# Patient Record
Sex: Male | Born: 1945
Health system: Southern US, Community
[De-identification: ages and names within clinical notes are randomized; demographics above are authoritative.]

## PROBLEM LIST (undated history)

## (undated) DIAGNOSIS — A084 Viral intestinal infection, unspecified: Secondary | ICD-10-CM

## (undated) DIAGNOSIS — K219 Gastro-esophageal reflux disease without esophagitis: Secondary | ICD-10-CM

## (undated) DIAGNOSIS — I951 Orthostatic hypotension: Secondary | ICD-10-CM

## (undated) DIAGNOSIS — E109 Type 1 diabetes mellitus without complications: Secondary | ICD-10-CM

## (undated) DIAGNOSIS — I739 Peripheral vascular disease, unspecified: Secondary | ICD-10-CM

## (undated) DIAGNOSIS — F528 Other sexual dysfunction not due to a substance or known physiological condition: Secondary | ICD-10-CM

## (undated) DIAGNOSIS — N529 Male erectile dysfunction, unspecified: Secondary | ICD-10-CM

## (undated) DIAGNOSIS — E785 Hyperlipidemia, unspecified: Secondary | ICD-10-CM

## (undated) DIAGNOSIS — R531 Weakness: Secondary | ICD-10-CM

## (undated) DIAGNOSIS — I779 Disorder of arteries and arterioles, unspecified: Secondary | ICD-10-CM

## (undated) DIAGNOSIS — I219 Acute myocardial infarction, unspecified: Secondary | ICD-10-CM

## (undated) DIAGNOSIS — G459 Transient cerebral ischemic attack, unspecified: Secondary | ICD-10-CM

## (undated) DIAGNOSIS — I10 Essential (primary) hypertension: Secondary | ICD-10-CM

## (undated) DIAGNOSIS — I5189 Other ill-defined heart diseases: Secondary | ICD-10-CM

## (undated) DIAGNOSIS — R002 Palpitations: Secondary | ICD-10-CM

## (undated) DIAGNOSIS — C61 Malignant neoplasm of prostate: Secondary | ICD-10-CM

## (undated) DIAGNOSIS — I25810 Atherosclerosis of coronary artery bypass graft(s) without angina pectoris: Secondary | ICD-10-CM

## (undated) HISTORY — DX: Viral intestinal infection, unspecified: A08.4

## (undated) HISTORY — DX: Malignant neoplasm of prostate: C61

## (undated) HISTORY — DX: Other ill-defined heart diseases: I51.89

## (undated) HISTORY — DX: Palpitations: R00.2

## (undated) HISTORY — DX: Hyperlipidemia, unspecified: E78.5

## (undated) HISTORY — DX: Other sexual dysfunction not due to a substance or known physiological condition: F52.8

## (undated) HISTORY — PX: REFRACTIVE SURGERY: SHX103

## (undated) HISTORY — PX: MOHS SURGERY: SHX181

## (undated) HISTORY — DX: Male erectile dysfunction, unspecified: N52.9

## (undated) HISTORY — DX: Peripheral vascular disease, unspecified: I73.9

## (undated) HISTORY — PX: CAROTID ENDARTERECTOMY: SUR193

## (undated) HISTORY — DX: Essential (primary) hypertension: I10

## (undated) HISTORY — DX: Gastro-esophageal reflux disease without esophagitis: K21.9

## (undated) HISTORY — PX: CARDIAC CATHETERIZATION: SHX172

## (undated) HISTORY — DX: Acute myocardial infarction, unspecified: I21.9

## (undated) HISTORY — DX: Weakness: R53.1

## (undated) HISTORY — DX: Disorder of arteries and arterioles, unspecified: I77.9

## (undated) HISTORY — PX: CATARACT EXTRACTION, BILATERAL: SHX1313

## (undated) HISTORY — DX: Atherosclerosis of coronary artery bypass graft(s) without angina pectoris: I25.810

## (undated) HISTORY — PX: CORONARY ANGIOPLASTY: SHX604

---

## 1994-02-05 DIAGNOSIS — I219 Acute myocardial infarction, unspecified: Secondary | ICD-10-CM

## 1994-02-05 HISTORY — PX: CORONARY ARTERY BYPASS GRAFT: SHX141

## 1994-02-05 HISTORY — DX: Acute myocardial infarction, unspecified: I21.9

## 2002-03-03 ENCOUNTER — Encounter: Payer: Self-pay | Admitting: *Deleted

## 2002-03-05 ENCOUNTER — Inpatient Hospital Stay (HOSPITAL_COMMUNITY): Admission: RE | Admit: 2002-03-05 | Discharge: 2002-03-06 | Payer: Self-pay | Admitting: *Deleted

## 2002-03-05 ENCOUNTER — Encounter (INDEPENDENT_AMBULATORY_CARE_PROVIDER_SITE_OTHER): Payer: Self-pay | Admitting: *Deleted

## 2002-03-23 ENCOUNTER — Encounter: Payer: Self-pay | Admitting: *Deleted

## 2002-03-23 ENCOUNTER — Encounter: Admission: RE | Admit: 2002-03-23 | Discharge: 2002-03-23 | Payer: Self-pay | Admitting: *Deleted

## 2003-12-22 ENCOUNTER — Ambulatory Visit: Payer: Self-pay | Admitting: *Deleted

## 2004-03-31 ENCOUNTER — Ambulatory Visit: Payer: Self-pay | Admitting: *Deleted

## 2005-03-14 ENCOUNTER — Ambulatory Visit: Payer: Self-pay | Admitting: Cardiology

## 2005-03-28 ENCOUNTER — Ambulatory Visit: Payer: Self-pay | Admitting: Cardiology

## 2005-03-28 ENCOUNTER — Ambulatory Visit: Payer: Self-pay

## 2005-04-03 ENCOUNTER — Ambulatory Visit: Payer: Self-pay

## 2005-12-31 ENCOUNTER — Ambulatory Visit: Payer: Self-pay | Admitting: Cardiology

## 2006-01-01 ENCOUNTER — Ambulatory Visit: Payer: Self-pay | Admitting: Internal Medicine

## 2006-06-26 ENCOUNTER — Ambulatory Visit: Payer: Self-pay | Admitting: Internal Medicine

## 2006-07-05 ENCOUNTER — Ambulatory Visit: Payer: Self-pay

## 2006-07-24 ENCOUNTER — Ambulatory Visit: Payer: Self-pay | Admitting: Internal Medicine

## 2006-07-31 ENCOUNTER — Inpatient Hospital Stay (HOSPITAL_BASED_OUTPATIENT_CLINIC_OR_DEPARTMENT_OTHER): Admission: RE | Admit: 2006-07-31 | Discharge: 2006-07-31 | Payer: Self-pay | Admitting: Internal Medicine

## 2006-07-31 ENCOUNTER — Ambulatory Visit: Payer: Self-pay | Admitting: Internal Medicine

## 2006-09-09 ENCOUNTER — Ambulatory Visit: Payer: Self-pay | Admitting: Internal Medicine

## 2006-11-11 ENCOUNTER — Ambulatory Visit: Payer: Self-pay | Admitting: Internal Medicine

## 2007-03-11 ENCOUNTER — Ambulatory Visit: Payer: Self-pay | Admitting: Internal Medicine

## 2007-03-11 LAB — CONVERTED CEMR LAB
ALT: 26 units/L (ref 0–53)
AST: 36 units/L (ref 0–37)
Albumin: 4.2 g/dL (ref 3.5–5.2)
Alkaline Phosphatase: 62 units/L (ref 39–117)
BUN: 20 mg/dL (ref 6–23)
CO2: 23 meq/L (ref 19–32)
Calcium: 9.2 mg/dL (ref 8.4–10.5)
Chloride: 105 meq/L (ref 96–112)
Cholesterol: 110 mg/dL (ref 0–200)
Creatinine, Ser: 0.99 mg/dL (ref 0.40–1.50)
Glucose, Bld: 169 mg/dL — ABNORMAL HIGH (ref 70–99)
HDL: 41 mg/dL (ref >39)
Hgb A1c MFr Bld: 8.8 % — ABNORMAL HIGH (ref 4.6–6.1)
LDL Cholesterol: 57 mg/dL (ref 0–99)
PSA: 1.34 ng/mL (ref 0.10–4.00)
Potassium: 4.7 meq/L (ref 3.5–5.3)
Sodium: 141 meq/L (ref 135–145)
Total Bilirubin: 0.9 mg/dL (ref 0.3–1.2)
Total CHOL/HDL Ratio: 2.7
Total Protein: 6.6 g/dL (ref 6.0–8.3)
Triglycerides: 62 mg/dL (ref <150)
VLDL: 12 mg/dL (ref 0–40)

## 2007-03-14 ENCOUNTER — Ambulatory Visit: Payer: Self-pay | Admitting: Internal Medicine

## 2007-07-01 ENCOUNTER — Ambulatory Visit: Payer: Self-pay | Admitting: Unknown Physician Specialty

## 2007-07-14 ENCOUNTER — Ambulatory Visit: Payer: Self-pay | Admitting: Internal Medicine

## 2007-07-15 ENCOUNTER — Ambulatory Visit: Payer: Self-pay | Admitting: Internal Medicine

## 2007-07-15 LAB — CONVERTED CEMR LAB
ALT: 21 units/L (ref 0–53)
AST: 25 units/L (ref 0–37)
Albumin: 4.1 g/dL (ref 3.5–5.2)
Alkaline Phosphatase: 61 units/L (ref 39–117)
BUN: 24 mg/dL — ABNORMAL HIGH (ref 6–23)
CO2: 22 meq/L (ref 19–32)
Calcium: 9.2 mg/dL (ref 8.4–10.5)
Chloride: 106 meq/L (ref 96–112)
Cholesterol: 121 mg/dL (ref 0–200)
Creatinine, Ser: 1.13 mg/dL (ref 0.40–1.50)
Glucose, Bld: 150 mg/dL — ABNORMAL HIGH (ref 70–99)
HDL: 45 mg/dL (ref >39)
Hgb A1c MFr Bld: 8 % — ABNORMAL HIGH (ref 4.6–6.1)
LDL Cholesterol: 62 mg/dL (ref 0–99)
Potassium: 4.8 meq/L (ref 3.5–5.3)
Sodium: 141 meq/L (ref 135–145)
Total Bilirubin: 1.1 mg/dL (ref 0.3–1.2)
Total CHOL/HDL Ratio: 2.7
Total Protein: 6.8 g/dL (ref 6.0–8.3)
Triglycerides: 69 mg/dL (ref <150)
VLDL: 14 mg/dL (ref 0–40)

## 2007-10-16 ENCOUNTER — Ambulatory Visit: Payer: Self-pay | Admitting: *Deleted

## 2007-12-03 ENCOUNTER — Ambulatory Visit: Payer: Self-pay | Admitting: Internal Medicine

## 2007-12-03 LAB — CONVERTED CEMR LAB
ALT: 19 units/L (ref 0–53)
AST: 31 units/L (ref 0–37)
Albumin: 4 g/dL (ref 3.5–5.2)
Alkaline Phosphatase: 75 units/L (ref 39–117)
BUN: 20 mg/dL (ref 6–23)
CO2: 21 meq/L (ref 19–32)
Calcium: 8.6 mg/dL (ref 8.4–10.5)
Chloride: 107 meq/L (ref 96–112)
Cholesterol: 103 mg/dL (ref 0–200)
Creatinine, Ser: 0.91 mg/dL (ref 0.40–1.50)
Glucose, Bld: 92 mg/dL (ref 70–99)
HDL: 37 mg/dL — ABNORMAL LOW (ref >39)
Hgb A1c MFr Bld: 7 % — ABNORMAL HIGH (ref 4.6–6.1)
LDL Cholesterol: 56 mg/dL (ref 0–99)
Potassium: 4.1 meq/L (ref 3.5–5.3)
Sodium: 138 meq/L (ref 135–145)
Total Bilirubin: 0.8 mg/dL (ref 0.3–1.2)
Total CHOL/HDL Ratio: 2.8
Total Protein: 6.4 g/dL (ref 6.0–8.3)
Triglycerides: 51 mg/dL (ref <150)
VLDL: 10 mg/dL (ref 0–40)

## 2007-12-10 ENCOUNTER — Encounter: Payer: Self-pay | Admitting: Internal Medicine

## 2007-12-10 ENCOUNTER — Ambulatory Visit: Payer: Self-pay

## 2007-12-10 ENCOUNTER — Ambulatory Visit: Payer: Self-pay | Admitting: Internal Medicine

## 2008-02-19 ENCOUNTER — Ambulatory Visit: Payer: Self-pay | Admitting: Internal Medicine

## 2008-02-26 ENCOUNTER — Ambulatory Visit: Payer: Self-pay

## 2008-02-26 ENCOUNTER — Ambulatory Visit: Payer: Self-pay | Admitting: Internal Medicine

## 2008-02-26 LAB — CONVERTED CEMR LAB
ALT: 21 units/L (ref 0–53)
AST: 28 units/L (ref 0–37)
Albumin: 3.8 g/dL (ref 3.5–5.2)
Alkaline Phosphatase: 67 units/L (ref 39–117)
BUN: 19 mg/dL (ref 6–23)
Bilirubin, Direct: 0.2 mg/dL (ref 0.0–0.3)
CO2: 29 meq/L (ref 19–32)
Calcium: 9.2 mg/dL (ref 8.4–10.5)
Chloride: 105 meq/L (ref 96–112)
Cholesterol: 100 mg/dL (ref 0–200)
Creatinine, Ser: 1.1 mg/dL (ref 0.4–1.5)
GFR calc Af Amer: 87 mL/min
GFR calc non Af Amer: 72 mL/min
Glucose, Bld: 302 mg/dL — ABNORMAL HIGH (ref 70–99)
HDL: 38.6 mg/dL — ABNORMAL LOW (ref >39.0)
Hgb A1c MFr Bld: 7.9 % — ABNORMAL HIGH (ref 4.6–6.0)
LDL Cholesterol: 56 mg/dL (ref 0–99)
Potassium: 5.4 meq/L — ABNORMAL HIGH (ref 3.5–5.1)
Sodium: 139 meq/L (ref 135–145)
Total Bilirubin: 1.3 mg/dL — ABNORMAL HIGH (ref 0.3–1.2)
Total CHOL/HDL Ratio: 2.6
Total Protein: 6.6 g/dL (ref 6.0–8.3)
Triglycerides: 27 mg/dL (ref 0–149)
VLDL: 5 mg/dL (ref 0–40)

## 2008-03-16 ENCOUNTER — Ambulatory Visit: Payer: Self-pay | Admitting: Internal Medicine

## 2008-03-16 LAB — CONVERTED CEMR LAB
BUN: 17 mg/dL (ref 6–23)
CO2: 21 meq/L (ref 19–32)
Calcium: 8.7 mg/dL (ref 8.4–10.5)
Chloride: 103 meq/L (ref 96–112)
Creatinine, Ser: 0.98 mg/dL (ref 0.40–1.50)
Glucose, Bld: 312 mg/dL — ABNORMAL HIGH (ref 70–99)
Potassium: 4.7 meq/L (ref 3.5–5.3)
Sodium: 134 meq/L — ABNORMAL LOW (ref 135–145)

## 2008-06-08 ENCOUNTER — Encounter: Payer: Self-pay | Admitting: Internal Medicine

## 2008-06-08 ENCOUNTER — Ambulatory Visit: Payer: Self-pay | Admitting: Cardiology

## 2008-06-09 ENCOUNTER — Ambulatory Visit: Payer: Self-pay | Admitting: Internal Medicine

## 2008-06-09 ENCOUNTER — Encounter: Payer: Self-pay | Admitting: Internal Medicine

## 2008-06-09 DIAGNOSIS — I10 Essential (primary) hypertension: Secondary | ICD-10-CM | POA: Insufficient documentation

## 2008-06-09 DIAGNOSIS — I6529 Occlusion and stenosis of unspecified carotid artery: Secondary | ICD-10-CM | POA: Insufficient documentation

## 2008-06-09 DIAGNOSIS — F528 Other sexual dysfunction not due to a substance or known physiological condition: Secondary | ICD-10-CM | POA: Insufficient documentation

## 2008-06-09 DIAGNOSIS — I2510 Atherosclerotic heart disease of native coronary artery without angina pectoris: Secondary | ICD-10-CM | POA: Insufficient documentation

## 2008-06-09 DIAGNOSIS — E785 Hyperlipidemia, unspecified: Secondary | ICD-10-CM

## 2008-06-09 DIAGNOSIS — I25810 Atherosclerosis of coronary artery bypass graft(s) without angina pectoris: Secondary | ICD-10-CM

## 2008-06-09 DIAGNOSIS — I959 Hypotension, unspecified: Secondary | ICD-10-CM | POA: Insufficient documentation

## 2008-06-09 HISTORY — DX: Hyperlipidemia, unspecified: E78.5

## 2008-06-09 HISTORY — DX: Atherosclerosis of coronary artery bypass graft(s) without angina pectoris: I25.810

## 2008-06-09 HISTORY — DX: Other sexual dysfunction not due to a substance or known physiological condition: F52.8

## 2008-06-09 HISTORY — DX: Essential (primary) hypertension: I10

## 2008-07-28 ENCOUNTER — Encounter: Payer: Self-pay | Admitting: Cardiology

## 2008-07-28 ENCOUNTER — Ambulatory Visit: Payer: Self-pay | Admitting: Cardiology

## 2008-07-28 DIAGNOSIS — I951 Orthostatic hypotension: Secondary | ICD-10-CM | POA: Insufficient documentation

## 2008-09-21 ENCOUNTER — Telehealth: Payer: Self-pay | Admitting: Internal Medicine

## 2008-10-01 ENCOUNTER — Ambulatory Visit: Payer: Self-pay | Admitting: Internal Medicine

## 2008-10-01 DIAGNOSIS — E119 Type 2 diabetes mellitus without complications: Secondary | ICD-10-CM | POA: Insufficient documentation

## 2008-10-01 DIAGNOSIS — IMO0001 Reserved for inherently not codable concepts without codable children: Secondary | ICD-10-CM | POA: Insufficient documentation

## 2008-10-01 DIAGNOSIS — E118 Type 2 diabetes mellitus with unspecified complications: Secondary | ICD-10-CM | POA: Insufficient documentation

## 2008-10-01 DIAGNOSIS — Z794 Long term (current) use of insulin: Secondary | ICD-10-CM

## 2008-10-06 ENCOUNTER — Ambulatory Visit: Payer: Self-pay | Admitting: Internal Medicine

## 2008-10-06 DIAGNOSIS — I739 Peripheral vascular disease, unspecified: Secondary | ICD-10-CM

## 2008-10-06 HISTORY — DX: Peripheral vascular disease, unspecified: I73.9

## 2008-10-06 LAB — CONVERTED CEMR LAB
ALT: 22 units/L (ref 0–53)
AST: 29 units/L (ref 0–37)
Albumin: 4 g/dL (ref 3.5–5.2)
Alkaline Phosphatase: 69 units/L (ref 39–117)
BUN: 20 mg/dL (ref 6–23)
CO2: 22 meq/L (ref 19–32)
Calcium: 8.8 mg/dL (ref 8.4–10.5)
Chloride: 103 meq/L (ref 96–112)
Cholesterol: 116 mg/dL (ref 0–200)
Creatinine, Ser: 0.95 mg/dL (ref 0.40–1.50)
Glucose, Bld: 215 mg/dL — ABNORMAL HIGH (ref 70–99)
HDL: 48 mg/dL (ref >39)
Hgb A1c MFr Bld: 7.6 % — ABNORMAL HIGH (ref 4.6–6.1)
LDL Cholesterol: 58 mg/dL (ref 0–99)
Potassium: 4.8 meq/L (ref 3.5–5.3)
Sodium: 139 meq/L (ref 135–145)
Total Bilirubin: 0.7 mg/dL (ref 0.3–1.2)
Total CHOL/HDL Ratio: 2.4
Total Protein: 6.6 g/dL (ref 6.0–8.3)
Triglycerides: 48 mg/dL (ref <150)
VLDL: 10 mg/dL (ref 0–40)

## 2008-10-07 ENCOUNTER — Ambulatory Visit: Payer: Self-pay

## 2008-10-07 ENCOUNTER — Encounter: Payer: Self-pay | Admitting: Internal Medicine

## 2008-10-07 LAB — CONVERTED CEMR LAB
ALT: 20 units/L (ref 0–53)
AST: 26 units/L (ref 0–37)
Albumin: 4.1 g/dL (ref 3.5–5.2)
Alkaline Phosphatase: 64 units/L (ref 39–117)
BUN: 15 mg/dL (ref 6–23)
CO2: 23 meq/L (ref 19–32)
Calcium: 9.2 mg/dL (ref 8.4–10.5)
Chloride: 107 meq/L (ref 96–112)
Cholesterol: 111 mg/dL (ref 0–200)
Creatinine, Ser: 0.88 mg/dL (ref 0.40–1.50)
Glucose, Bld: 113 mg/dL — ABNORMAL HIGH (ref 70–99)
HDL: 46 mg/dL (ref >39)
Hgb A1c MFr Bld: 7.3 % — ABNORMAL HIGH (ref 4.6–6.1)
LDL Cholesterol: 57 mg/dL (ref 0–99)
Potassium: 4.6 meq/L (ref 3.5–5.3)
Sodium: 142 meq/L (ref 135–145)
Total Bilirubin: 0.9 mg/dL (ref 0.3–1.2)
Total CHOL/HDL Ratio: 2.4
Total Protein: 6.6 g/dL (ref 6.0–8.3)
Triglycerides: 40 mg/dL (ref <150)
VLDL: 8 mg/dL (ref 0–40)

## 2008-10-08 ENCOUNTER — Encounter: Payer: Self-pay | Admitting: Internal Medicine

## 2008-11-25 ENCOUNTER — Telehealth: Payer: Self-pay | Admitting: Internal Medicine

## 2008-11-25 DIAGNOSIS — R0602 Shortness of breath: Secondary | ICD-10-CM | POA: Insufficient documentation

## 2008-12-01 ENCOUNTER — Telehealth (INDEPENDENT_AMBULATORY_CARE_PROVIDER_SITE_OTHER): Payer: Self-pay | Admitting: *Deleted

## 2008-12-02 ENCOUNTER — Ambulatory Visit: Payer: Self-pay

## 2008-12-02 ENCOUNTER — Encounter (HOSPITAL_COMMUNITY): Admission: RE | Admit: 2008-12-02 | Discharge: 2009-02-01 | Payer: Self-pay | Admitting: Internal Medicine

## 2008-12-02 ENCOUNTER — Ambulatory Visit: Payer: Self-pay | Admitting: Cardiovascular Disease

## 2008-12-10 ENCOUNTER — Encounter: Payer: Self-pay | Admitting: Cardiovascular Disease

## 2008-12-20 ENCOUNTER — Encounter: Payer: Self-pay | Admitting: Internal Medicine

## 2008-12-20 ENCOUNTER — Ambulatory Visit: Payer: Self-pay | Admitting: Internal Medicine

## 2008-12-20 DIAGNOSIS — R9439 Abnormal result of other cardiovascular function study: Secondary | ICD-10-CM | POA: Insufficient documentation

## 2008-12-20 LAB — CONVERTED CEMR LAB
BUN: 16 mg/dL (ref 6–23)
CO2: 26 meq/L (ref 19–32)
Calcium: 9.3 mg/dL (ref 8.4–10.5)
Chloride: 103 meq/L (ref 96–112)
Creatinine, Ser: 1 mg/dL (ref 0.40–1.50)
Glucose, Bld: 196 mg/dL — ABNORMAL HIGH (ref 70–99)
HCT: 45.5 % (ref 39.0–52.0)
Hemoglobin: 14.8 g/dL (ref 13.0–17.0)
INR: 1.14 (ref <1.50)
MCHC: 32.5 g/dL (ref 30.0–36.0)
MCV: 96.6 fL (ref 78.0–100.0)
Platelets: 159 10*3/uL (ref 150–400)
Potassium: 5.2 meq/L (ref 3.5–5.3)
Prothrombin Time: 14.5 s (ref 11.6–15.2)
RBC: 4.71 M/uL (ref 4.22–5.81)
RDW: 13.1 % (ref 11.5–15.5)
Sodium: 137 meq/L (ref 135–145)
WBC: 8 10*3/uL (ref 4.0–10.5)

## 2008-12-21 ENCOUNTER — Ambulatory Visit: Payer: Self-pay | Admitting: Internal Medicine

## 2008-12-21 ENCOUNTER — Inpatient Hospital Stay (HOSPITAL_BASED_OUTPATIENT_CLINIC_OR_DEPARTMENT_OTHER): Admission: RE | Admit: 2008-12-21 | Discharge: 2008-12-21 | Payer: Self-pay | Admitting: Internal Medicine

## 2009-01-05 ENCOUNTER — Ambulatory Visit: Payer: Self-pay | Admitting: Internal Medicine

## 2009-05-11 ENCOUNTER — Ambulatory Visit: Payer: Self-pay | Admitting: Cardiovascular Disease

## 2009-05-12 LAB — CONVERTED CEMR LAB
ALT: 19 units/L (ref 0–53)
AST: 23 units/L (ref 0–37)
Albumin: 4.1 g/dL (ref 3.5–5.2)
Alkaline Phosphatase: 65 units/L (ref 39–117)
BUN: 15 mg/dL (ref 6–23)
CO2: 25 meq/L (ref 19–32)
Calcium: 8.9 mg/dL (ref 8.4–10.5)
Chloride: 103 meq/L (ref 96–112)
Cholesterol: 119 mg/dL (ref 0–200)
Creatinine, Ser: 0.96 mg/dL (ref 0.40–1.50)
Glucose, Bld: 223 mg/dL — ABNORMAL HIGH (ref 70–99)
HDL: 46 mg/dL (ref >39)
Hgb A1c MFr Bld: 8.2 % — ABNORMAL HIGH (ref 4.6–6.1)
LDL Cholesterol: 62 mg/dL (ref 0–99)
Potassium: 4.8 meq/L (ref 3.5–5.3)
Sodium: 139 meq/L (ref 135–145)
Total Bilirubin: 0.9 mg/dL (ref 0.3–1.2)
Total CHOL/HDL Ratio: 2.6
Total Protein: 6.3 g/dL (ref 6.0–8.3)
Triglycerides: 54 mg/dL (ref <150)
VLDL: 11 mg/dL (ref 0–40)

## 2009-05-18 ENCOUNTER — Ambulatory Visit: Payer: Self-pay | Admitting: Internal Medicine

## 2009-05-18 DIAGNOSIS — R42 Dizziness and giddiness: Secondary | ICD-10-CM | POA: Insufficient documentation

## 2009-08-11 ENCOUNTER — Ambulatory Visit: Payer: Medicare Other | Admitting: Family Medicine

## 2009-08-19 ENCOUNTER — Encounter: Payer: Self-pay | Admitting: Internal Medicine

## 2009-11-18 ENCOUNTER — Ambulatory Visit: Payer: Self-pay | Admitting: Internal Medicine

## 2009-11-22 ENCOUNTER — Encounter: Payer: Self-pay | Admitting: Internal Medicine

## 2009-11-22 LAB — CONVERTED CEMR LAB
AST: 24 units/L (ref 0–37)
Albumin: 4.2 g/dL (ref 3.5–5.2)
Alkaline Phosphatase: 61 units/L (ref 39–117)
Calcium: 9.5 mg/dL (ref 8.4–10.5)
Chloride: 104 meq/L (ref 96–112)
Cholesterol: 107 mg/dL (ref 0–200)
Glucose, Bld: 209 mg/dL — ABNORMAL HIGH (ref 70–99)
HDL: 47 mg/dL (ref >39)
LDL Cholesterol: 51 mg/dL (ref 0–99)
Potassium: 5.2 meq/L (ref 3.5–5.3)
Sodium: 140 meq/L (ref 135–145)
Total Protein: 6.8 g/dL (ref 6.0–8.3)
Triglycerides: 43 mg/dL (ref <150)

## 2009-11-25 ENCOUNTER — Ambulatory Visit: Payer: Self-pay | Admitting: Internal Medicine

## 2009-12-01 ENCOUNTER — Telehealth: Payer: Self-pay | Admitting: Internal Medicine

## 2009-12-13 ENCOUNTER — Encounter: Payer: Self-pay | Admitting: Internal Medicine

## 2009-12-13 ENCOUNTER — Ambulatory Visit: Payer: Self-pay

## 2010-02-28 ENCOUNTER — Encounter: Payer: Self-pay | Admitting: Internal Medicine

## 2010-02-28 ENCOUNTER — Ambulatory Visit
Admission: RE | Admit: 2010-02-28 | Discharge: 2010-02-28 | Payer: Self-pay | Source: Home / Self Care | Attending: Internal Medicine | Admitting: Internal Medicine

## 2010-03-05 LAB — CONVERTED CEMR LAB
BUN: 14 mg/dL (ref 6–23)
Creatinine, Ser: 0.85 mg/dL (ref 0.40–1.50)
Hgb A1c MFr Bld: 7.5 % — ABNORMAL HIGH (ref <5.7)
Potassium: 4.8 meq/L (ref 3.5–5.3)

## 2010-03-07 NOTE — Letter (Signed)
Summary: Custom - Lipid  Architectural technologist at [facility] [address]   [place], [place] [address]   Phone: [phone]  Fax: [phone]     November 22, 2009 MRN: [id number]   Derek Hogan [address] [place], [place]  [address]   Dear Mr. Marzan,  We have reviewed your cholesterol results.  They are as follows:     Total Cholesterol:    107 (Desirable: less than 150)       HDL  Cholesterol:     47  (Desirable: greater than 40 for men and 50 for women)       LDL Cholesterol:       51  (Desirable: less than 70 for moderate to high risk)       Triglycerides:       43  (Desirable: less than 150)  Our recommendations include: Looks good!   Call our office at the number listed above if you have any questions.  Lowering your LDL cholesterol is important, but it is only one of a large number of "risk factors" that may indicate that you are at risk for heart disease, stroke or other complications of hardening of the arteries.  Other risk factors include:   A.  Cigarette Smoking* B.  High Blood Pressure* C.  Obesity* D.   Low HDL Cholesterol (see yours above)* E.   Diabetes Mellitus (higher risk if your is uncontrolled) F.  Family history of premature heart disease G.  Previous history of stroke or cardiovascular disease    *These are risk factors YOU HAVE CONTROL OVER.  For more information, visit .  There is now evidence that lowering the TOTAL CHOLESTEROL AND LDL CHOLESTEROL can reduce the risk of heart disease.  The American Heart Association recommends the following guidelines for the treatment of elevated cholesterol:  1.  If there is now current heart disease and less than two risk factors, TOTAL CHOLESTEROL should be less than 200 and LDL CHOLESTEROL should be less than 100. 2.  If there is current heart disease or two or more risk factors, TOTAL CHOLESTEROL should be less than 200 and LDL CHOLESTEROL should be less than 70.  A diet low in cholesterol,  saturated fat, and calories is the cornerstone of treatment for elevated cholesterol.  Cessation of smoking and exercise are also important in the management of elevated cholesterol and preventing vascular disease.  Studies have shown that 30 to 60 minutes of physical activity most days can help lower blood pressure, lower cholesterol, and keep your weight at a healthy level.  Drug therapy is used when cholesterol levels do not respond to therapeutic lifestyle changes (smoking cessation, diet, and exercise) and remains unacceptably high.  If medication is started, it is important to have you levels checked periodically to evaluate the need for further treatment options.  Thank you,    [employer] Team

## 2010-03-07 NOTE — Letter (Signed)
Summary: [place] Results Engineer, agricultural at [facility] [address]   [place], [place] [address]   Phone: [phone]  Fax: [phone]      November 22, 2009 MRN: [id number]   Derek Hogan [address] [place], [place]  [address]   Dear Mr. [facility],  Your test ordered by Selena Batten has been reviewed by your physician (or physician assistant) and was found to be normal or stable. Your physician (or physician assistant) felt no changes were needed at this time.  ____ Echocardiogram  ____ Cardiac Stress Test  __x__ Lab Work  ____ Peripheral vascular study of arms, legs or neck  ____ CT scan or X-ray  ____ Lung or Breathing test  ____ Other:   Thank you.   Benedict Needy, RN    Arvilla Meres, MD, F.A.C.C

## 2010-03-07 NOTE — Assessment & Plan Note (Signed)
Summary: F4M/AMD   Visit Type:  Follow-up Primary Provider:  Einar Crow, MD   History of Present Illness: Derek Hogan is a delightful 65 year old male with a history of coronary artery disease status post previous inferior wall myocardial infarction followed by bypass surgery in 1996.  Last cardiac catheterization was in Nov 2010 for Myoview with mild inferior ischemia. This showed three-vessel coronary artery disease with patent grafts. There was significant lesion in distal RCA after the graft insertion which compromised flow to distal PL. There was also some mild flow to this region antegrade via native vessel.  I reviewed with Dr. Excell Seltzer and while ths area may be a source of mild ischemia we felt that trying to angioplasty the distal native RCA might compromise the flow in the SVG-PDA via competitive flow. Thus we elected to continue medical management.  He also has a history  of peripheral vascular disease status post right carotid endarterectomy by Dr. Liliane Bade, hypertension, hyperlipidemia, and diabetes which he is on an insulin pump.   He returns today for routine f/u.   He continues  to do very well. Exercising daily 30 min on eeliptical in am. Then treamill and weights in afternoon. No CP or SOB. No claudication. No edema. Gets dizzy occasionally when stands up. Ears ringing. LDL checked recently 62. HDL 46  Problems Prior to Update: 1)  Myocardial Perfusion Scan, With Stress Test, Abnormal  (ICD-794.39) 2)  Shortness of Breath  (ICD-786.05) 3)  Unspecified Peripheral Vascular Disease  (ICD-443.9) 4)  Dm  (ICD-250.00) 5)  Hypotension, Orthostatic  (ICD-458.0) 6)  Erectile Dysfunction  (ICD-302.72) 7)  Carotid Stenosis  (ICD-433.10) 8)  Hypotension, Unspecified  (ICD-458.9) 9)  Cad, Artery Bypass Graft  (ICD-414.04) 10)  Hyperlipidemia-mixed  (ICD-272.4) 11)  Hypertension, Unspecified  (ICD-401.9)  Medications Prior to Update: 1)  Viagra 100 Mg Tabs (Sildenafil Citrate) ....  Take 1 Tablet By Mouth As Directed 2)  Aspirin Ec 325 Mg Tbec (Aspirin) .... Take One Tablet By Mouth Daily 3)  Niacin Cr 750 Mg Cr-Tabs (Niacin) .... 2 By Mouth Once Daily 4)  Prilosec 20 Mg Cpdr (Omeprazole) .Marland Kitchen.. 1 Tab Once Daily 5)  Zetia 10 Mg Tabs (Ezetimibe) .... 1/2 Tab Once Daily 6)  Novolog 100 Unit/ml Soln (Insulin Aspart) .... Use As Directed 7)  Lisinopril 5 Mg Tabs (Lisinopril) .... 1/4 Tab Once Daily 8)  Lipitor 40 Mg Tabs (Atorvastatin Calcium) .Marland Kitchen.. 1 Tab Once Daily 9)  Atenolol 25 Mg Tabs (Atenolol) .... 1/2 Tab Once Daily 10)  Ambien 5 Mg Tabs (Zolpidem Tartrate) .... As Needed  Current Medications (verified): 1)  Viagra 100 Mg Tabs (Sildenafil Citrate) .... Take 1 Tablet By Mouth As Directed 2)  Aspirin Ec 325 Mg Tbec (Aspirin) .... Take One Tablet By Mouth Daily 3)  Niacin Cr 750 Mg Cr-Tabs (Niacin) .... 2 By Mouth Once Daily 4)  Prilosec 20 Mg Cpdr (Omeprazole) .Marland Kitchen.. 1 Tab Once Daily 5)  Zetia 10 Mg Tabs (Ezetimibe) .... 1/2 Tab Once Daily 6)  Novolog 100 Unit/ml Soln (Insulin Aspart) .... Use As Directed 7)  Lisinopril 5 Mg Tabs (Lisinopril) .... 1/4 Tab Once Daily 8)  Lipitor 40 Mg Tabs (Atorvastatin Calcium) .Marland Kitchen.. 1 Tab Once Daily 9)  Atenolol 25 Mg Tabs (Atenolol) .... 1/2 Tab Once Daily 10)  Ambien 5 Mg Tabs (Zolpidem Tartrate) .... As Needed  Allergies (verified): No Known Drug Allergies  Past History:  Past Medical History:  1. Coronary artery        --s/p  previous inferior wall MI followed by bypass surgery in 1996.         --last cardiac cath Nov 2010 2008, which showed three-vessel CAD with patent grafts, EF was 50%.              likely small area of ischemia in distal RCA and PL - medical management       --echo 11/09 EF 60-65%   2. PVD      -- s/p right carotid endarterectomy by Dr. Liliane Bade     -- carotid u/s 0-39% (9/10)      -- ABI L 0.95 R 1.0 (2/07)  3. Hypertension  4. Hyperlipidemia  5. Diabetes on an insulin pump.   6. Erectile  dysfunction  Review of Systems       As per HPI and past medical history; otherwise all systems negative.   Vital Signs:  Patient profile:   65 year old male Height:      72 inches Weight:      245 pounds BMI:     33.35 Pulse rate:   70 / minute Pulse rhythm:   regular BP sitting:   124 / 54  (left arm) Cuff size:   large  Vitals Entered By: Mercer Pod (May 18, 2009 3:49 PM)  Physical Exam  General:  General:  Gen: well appearing. no resp difficulty HEENT: normal Neck: supple. no JVD. R CEA scar. Carotids 2+ bilat; soft R bruit. No lymphadenopathy or thryomegaly appreciated. Cor: PMI nondisplaced. Regular rate & rhythm. No rubs, gallops, murmur. Lungs: clear Abdomen: soft, nontender, nondistended. No hepatosplenomegaly. No bruits or masses. Good bowel sounds. Extremities: no cyanosis, clubbing, rash, edema. goroin site ok with no hematoma or bruit Neuro: alert & orientedx3, cranial nerves grossly intact. moves all 4 extremities w/o difficulty. affect pleasant    Impression & Recommendations:  Problem # 1:  CAD, ARTERY BYPASS GRAFT (ICD-414.04) Stable. No evidence of ischemia. Continue current regimen.  Problem # 2:  HYPERLIPIDEMIA-MIXED (ICD-272.4) Lipids look great. LDL and HDL at goal. Continue current therapy.  Problem # 3:  CAROTID STENOSIS (ICD-433.10) Carotids look good. Repeat u/s in 2 years.  Problem # 4:  DIZZINESS (ICD-780.4) Suspect mild orthostasis. Suggested to keep fluid intake up while exercising.  Patient Instructions: 1)  Your physician recommends that you schedule a follow-up appointment in: 6 months 2)  Your physician recommends that you return for a FASTING lipid profile: lipids, cmet prior to 6 month appt

## 2010-03-07 NOTE — Assessment & Plan Note (Signed)
Summary: F6M/AMD   Visit Type:  Follow-up Primary Provider:  Einar Crow, MD  CC:  Denies chest pain or shortness of breath..  History of Present Illness: Derek Hogan is a delightful 65 year old male with a history of coronary artery disease status post previous inferior wall myocardial infarction followed by bypass surgery in 1996.  Last cardiac catheterization was in Nov 2010 for Myoview with mild inferior ischemia. This showed three-vessel coronary artery disease with patent grafts. There was significant lesion in distal RCA after the graft insertion which compromised flow to distal PL. There was also some mild flow to this region antegrade via native vessel.  I reviewed with Dr. Excell Seltzer and while ths area may be a source of mild ischemia we felt that trying to angioplasty the distal native RCA might compromise the flow in the SVG-PDA via competitive flow. Thus we elected to continue medical management.  He also has a history  of peripheral vascular disease status post right carotid endarterectomy by Dr. Liliane Bade, hypertension, hyperlipidemia, and diabetes which he is on an insulin pump.   Doing very well. Remain active with exercise 30-60 mins/day. Still dizzy at times but much better. Not orthostatic. No HF symptoms. No claudication.   Recent lipis looked great. Cr mildly elevated at 1.19 (suspect he is a little elevated)  Current Medications (verified): 1)  Viagra 100 Mg Tabs (Sildenafil Citrate) .... Take 1 Tablet By Mouth As Directed 2)  Aspirin Ec 325 Mg Tbec (Aspirin) .... Take One Tablet By Mouth Daily 3)  Niacin Cr 750 Mg Cr-Tabs (Niacin) .... 2 By Mouth Once Daily 4)  Prilosec 20 Mg Cpdr (Omeprazole) .Marland Kitchen.. 1 Tab Once Daily 5)  Zetia 10 Mg Tabs (Ezetimibe) .... 1/2 Tab Once Daily 6)  Novolog 100 Unit/ml Soln (Insulin Aspart) .... Use As Directed 7)  Lisinopril 5 Mg Tabs (Lisinopril) .... 1/4 Tab Once Daily 8)  Lipitor 40 Mg Tabs (Atorvastatin Calcium) .Marland Kitchen.. 1 Tab Once Daily 9)   Atenolol 25 Mg Tabs (Atenolol) .... 1/2 Tab Once Daily 10)  Ambien 5 Mg Tabs (Zolpidem Tartrate) .... As Needed  Allergies (verified): No Known Drug Allergies  Past History:  Past Medical History: Last updated: 05/18/2009  1. Coronary artery        --s/p previous inferior wall MI followed by bypass surgery in 1996.         --last cardiac cath Nov 2010 2008, which showed three-vessel CAD with patent grafts, EF was 50%.              likely small area of ischemia in distal RCA and PL - medical management       --echo 11/09 EF 60-65%   2. PVD      -- s/p right carotid endarterectomy by Dr. Liliane Bade     -- carotid u/s 0-39% (9/10)      -- ABI L 0.95 R 1.0 (2/07)  3. Hypertension  4. Hyperlipidemia  5. Diabetes on an insulin pump.   6. Erectile dysfunction  Past Surgical History: Last updated: 06/09/2008 CABG - 1996  Family History: Last updated: 06/09/2008 Family History of Coronary Artery Disease:  Family History of CVA or Stroke:   Social History: Last updated: 06/09/2008 Retired  Married  Tobacco Use - Former. -  1996 Alcohol Use - no  Risk Factors: Smoking Status: quit (06/09/2008)  Review of Systems       As per HPI and past medical history; otherwise all systems negative.   Vital Signs:  Patient  profile:   65 year old male Height:      72 inches Weight:      245 pounds BMI:     33.35 Pulse rate:   59 / minute BP sitting:   118 / 58  (left arm) Cuff size:   large  Vitals Entered By: Bishop Dublin, CMA (November 25, 2009 10:36 AM)  Physical Exam  General:  well appearing. no resp difficulty HEENT: normal Neck: supple. no JVD. R CEA scar. Carotids 2+ bilat; soft R bruit. No lymphadenopathy or thryomegaly appreciated. Cor: PMI nondisplaced. Regular rate & rhythm. No rubs, gallops, murmur. Lungs: clear Abdomen: soft, nontender, nondistended. No hepatosplenomegaly. No bruits or masses. Good bowel sounds. Extremities: no cyanosis, clubbing, rash, edema.  goroin site ok with no hematoma or bruit Neuro: alert & orientedx3, cranial nerves grossly intact. moves all 4 extremities w/o difficulty. affect pleasant    Impression & Recommendations:  Problem # 1:  CAD, ARTERY BYPASS GRAFT (ICD-414.04) Stable. No evidence of ischemia. Continue current regimen.  Problem # 2:  CAROTID STENOSIS (ICD-433.10) Minimal disease. Asymptomatic. Due for yearly u/s.  Problem # 3:  HYPERLIPIDEMIA-MIXED (ICD-272.4) Lipids look great. LDL and HDL at goal. Continue current therapy.  Problem # 4:  Mildly elevated creatinine Suspect pre-renal azotemia. Encouraged to drink a bit more fluids. Will recheck BMET today.   Other Orders: Carotid Duplex (Carotid Duplex)

## 2010-03-07 NOTE — Assessment & Plan Note (Signed)
Summary: Nurse visit- lightheadedness  Nurse Visit   Vital Signs:  Patient profile:   65 year old male Pulse (ortho):   68 / minute BP sitting:   110 / 52  (right arm) BP standing:   100 / 52  Serial Vital Signs/Assessments:  Time      Position  BP       Pulse  Resp  Temp     By 1:20pm    Lying RA  114/52   64                    Sherri Rad, RN, BSN 1:20pm    Sitting   110/52   68                    Sherri Rad, Charity fundraiser, BSN 1:20pm    Standing  100/52   68                    Sherri Rad, RN, BSN  Comments: 1:20pm 104/62 (2 min)- standing By: Sherri Rad, RN, BSN    Primary Provider:  Einar Crow, MD   History of Present Illness: The pt walked in to the office today stating he did not feel well when standing. He states this started about an hour prior to coming in. He states he does not feel dizzy, but he does feel lightheaded. His medications were verified. I inquired if the pt has been out in the heat. He states not today, but in the previous few days, he has exercised for about an hour and a half and sweated a lot. He states his blood sugars have been fine. He just ate prior to coming in the office. I explained to the pt he may be somewhat dehydrated. He was instructed to drink extra fluid today. He states he was planning to exercise this afternoon, but I advised that he rest instead. He will call us monday if he is not feeling better.    Current Medications (verified): 1)  Viagra 100 Mg Tabs (Sildenafil Citrate) .... Take 1 Tablet By Mouth As Directed 2)  Aspirin Ec 325 Mg Tbec (Aspirin) .... Take One Tablet By Mouth Daily 3)  Niacin Cr 750 Mg Cr-Tabs (Niacin) .... 2 By Mouth Once Daily 4)  Prilosec 20 Mg Cpdr (Omeprazole) .Marland Kitchen.. 1 Tab Once Daily 5)  Zetia 10 Mg Tabs (Ezetimibe) .... 1/2 Tab Once Daily 6)  Novolog 100 Unit/ml Soln (Insulin Aspart) .... Use As Directed 7)  Lisinopril 5 Mg Tabs (Lisinopril) .... 1/4 Tab Once Daily 8)  Lipitor 40 Mg Tabs  (Atorvastatin Calcium) .Marland Kitchen.. 1 Tab Once Daily 9)  Atenolol 25 Mg Tabs (Atenolol) .... 1/2 Tab Once Daily 10)  Ambien 5 Mg Tabs (Zolpidem Tartrate) .... As Needed  Allergies (verified): No Known Drug Allergies  Past History:  Past Medical History: Last updated: 05/18/2009  1. Coronary artery        --s/p previous inferior wall MI followed by bypass surgery in 1996.         --last cardiac cath Nov 2010 2008, which showed three-vessel CAD with patent grafts, EF was 50%.              likely small area of ischemia in distal RCA and PL - medical management       --echo 11/09 EF 60-65%   2. PVD      -- s/p right carotid endarterectomy by Dr. Liliane Bade     --  carotid u/s 0-39% (9/10)      -- ABI L 0.95 R 1.0 (2/07)  3. Hypertension  4. Hyperlipidemia  5. Diabetes on an insulin pump.   6. Erectile dysfunction  Appended Document: Nurse visit- lightheadedness agree with plan.

## 2010-03-07 NOTE — Miscellaneous (Signed)
Summary: Orders Update  Clinical Lists Changes  Orders: Added new Test order of T-Basic Metabolic Panel [id number]) - Signed Added new Test order of T-Hgb A1C ([id number]) - Signed

## 2010-03-07 NOTE — Progress Notes (Signed)
Summary: LABS  Phone Note Call from Patient Call back at Home Phone [phone]   Caller: SELF Call For: BENSIMHON Summary of Call: CALLING ABOUT LAB RESULTS Initial call taken by: Harlon Flor,  December 01, 2009 12:29 PM  Follow-up for Phone Call        made pt aware that Dr. Gala Romney had not signed off on labs yet will mail him a copy after labs are signed off on Follow-up by: Benedict Needy, RN,  December 01, 2009 2:30 PM

## 2010-03-09 NOTE — Assessment & Plan Note (Signed)
Summary: [facility]   Visit Type:  Follow-up Primary Provider:  Einar Crow, MD  CC:  "doing well" denies chest pain and SOB.  History of Present Illness: Derek Hogan is a delightful 65 year old male with a history of coronary artery disease status post previous inferior wall myocardial infarction followed by bypass surgery in 1996.  Last cardiac catheterization was in Nov 2010 for Myoview with mild inferior ischemia. This showed three-vessel coronary artery disease with patent grafts. There was significant lesion in distal RCA after the graft insertion which compromised flow to distal PL. There was also some mild flow to this region antegrade via native vessel.  I reviewed with Dr. Excell Seltzer and while ths area may be a source of mild ischemia we felt that trying to angioplasty the distal native RCA might compromise the flow in the SVG-PDA via competitive flow. Thus we elected to continue medical management.  He also has a history  of peripheral vascular disease status post right carotid endarterectomy, hypertension, hyperlipidemia, and diabetes which he is on an insulin pump.   Overall doing well. Continues to exercise every day without CP or undue SOB. However does note that when he walks the dog he gets SOB at times.  Has gained about 5 pounds. No orthopnea or PND. Remain active with exercise 30-60 mins/day. Still dizzy at times but much better. Not orthostatic. No HF symptoms. No claudication.   Recent carotid u/s 0-39% B  Current Medications (verified): 1)  Viagra 100 Mg Tabs (Sildenafil Citrate) .... Take 1 Tablet By Mouth As Directed 2)  Aspirin Ec 325 Mg Tbec (Aspirin) .... Take One Tablet By Mouth Daily 3)  Niacin Cr 750 Mg Cr-Tabs (Niacin) .... 2 By Mouth Once Daily 4)  Prilosec 20 Mg Cpdr (Omeprazole) .Marland Kitchen.. 1 Tab Once Daily 5)  Zetia 10 Mg Tabs (Ezetimibe) .... 1/2 Tab Once Daily 6)  Novolog 100 Unit/ml Soln (Insulin Aspart) .... Use As Directed 7)  Lisinopril 5 Mg Tabs  (Lisinopril) .... 1/4 Tab Once Daily 8)  Lipitor 40 Mg Tabs (Atorvastatin Calcium) .Marland Kitchen.. 1 Tab Once Daily 9)  Atenolol 25 Mg Tabs (Atenolol) .... 1/2 Tab Once Daily 10)  Ambien 5 Mg Tabs (Zolpidem Tartrate) .... As Needed  Allergies (verified): No Known Drug Allergies  Past History:  Past Medical History: Last updated: 05/18/2009  1. Coronary artery        --s/p previous inferior wall MI followed by bypass surgery in 1996.         --last cardiac cath Nov 2010 2008, which showed three-vessel CAD with patent grafts, EF was 50%.              likely small area of ischemia in distal RCA and PL - medical management       --echo 11/09 EF 60-65%   2. PVD      -- s/p right carotid endarterectomy by Dr. Liliane Bade     -- carotid u/s 0-39% (9/10)      -- ABI L 0.95 R 1.0 (2/07)  3. Hypertension  4. Hyperlipidemia  5. Diabetes on an insulin pump.   6. Erectile dysfunction  Past Surgical History: Last updated: 06/09/2008 CABG - 1996  Family History: Last updated: 06/09/2008 Family History of Coronary Artery Disease:  Family History of CVA or Stroke:   Social History: Last updated: 06/09/2008 Retired  Married  Tobacco Use - Former. -  1996 Alcohol Use - no  Risk Factors: Smoking Status: quit (06/09/2008)  Review of Systems  As per HPI and past medical history; otherwise all systems negative.   Vital Signs:  Patient profile:   65 year old male Height:      72 inches Weight:      249.25 pounds BMI:     33.93 Pulse rate:   64 / minute BP sitting:   102 / 68  (left arm) Cuff size:   large  Vitals Entered By: Lysbeth Galas CMA (February 28, 2010 3:44 PM)  Physical Exam  General:  well appearing. no resp difficulty HEENT: normal Neck: supple. no JVD. R CEA scar. Carotids 2+ bilat; soft R bruit. No lymphadenopathy or thryomegaly appreciated. Cor: PMI nondisplaced. Regular rate & rhythm. No rubs, gallops, murmur. Lungs: clear Abdomen: soft, nontender, nondistended. No  hepatosplenomegaly. No bruits or masses. Good bowel sounds. Extremities: no cyanosis, clubbing, rash, edema. goroin site ok with no hematoma or bruit Neuro: alert & orientedx3, cranial nerves grossly intact. moves all 4 extremities w/o difficulty. affect pleasant    Impression & Recommendations:  Problem # 1:  DYSPNEA Suspect this is likely due to weight gain and not angina or HF. have asked him to try and lose a few pounds and see if symptoms improve. we will see him back in 6 weks and reassess. knows to contact me if getting worse.   Problem # 2:  CAD, ARTERY BYPASS GRAFT (ICD-414.04) Stable. No evidence of ischemia. Continue current regimen.  Problem # 3:  CAROTID STENOSIS (ICD-433.10) U/s review with him. Looks good.   Other Orders: EKG w/ Interpretation (93000)  Patient Instructions: 1)  Your physician recommends that you schedule a follow-up appointment in: 6 weeks 2)  Your physician recommends that you continue on your current medications as directed. Please refer to the Current Medication list given to you today.

## 2010-03-30 ENCOUNTER — Ambulatory Visit: Payer: Medicare Other | Admitting: Internal Medicine

## 2010-04-11 ENCOUNTER — Ambulatory Visit: Payer: Self-pay | Admitting: Internal Medicine

## 2010-04-18 ENCOUNTER — Emergency Department (HOSPITAL_COMMUNITY): Payer: Medicare Other

## 2010-04-18 ENCOUNTER — Encounter (HOSPITAL_COMMUNITY): Payer: Self-pay | Admitting: Radiology

## 2010-04-18 ENCOUNTER — Inpatient Hospital Stay (HOSPITAL_COMMUNITY)
Admission: EM | Admit: 2010-04-18 | Discharge: 2010-04-19 | DRG: 316 | Disposition: A | Discharge status: Home / Self Care | Payer: Medicare Other | Attending: Internal Medicine | Admitting: Internal Medicine

## 2010-04-18 DIAGNOSIS — K219 Gastro-esophageal reflux disease without esophagitis: Secondary | ICD-10-CM | POA: Diagnosis present

## 2010-04-18 DIAGNOSIS — Z7982 Long term (current) use of aspirin: Secondary | ICD-10-CM

## 2010-04-18 DIAGNOSIS — I959 Hypotension, unspecified: Principal | ICD-10-CM | POA: Diagnosis present

## 2010-04-18 DIAGNOSIS — A088 Other specified intestinal infections: Secondary | ICD-10-CM | POA: Diagnosis present

## 2010-04-18 DIAGNOSIS — E860 Dehydration: Secondary | ICD-10-CM | POA: Diagnosis present

## 2010-04-18 DIAGNOSIS — I2510 Atherosclerotic heart disease of native coronary artery without angina pectoris: Secondary | ICD-10-CM | POA: Diagnosis present

## 2010-04-18 DIAGNOSIS — E785 Hyperlipidemia, unspecified: Secondary | ICD-10-CM | POA: Diagnosis present

## 2010-04-18 DIAGNOSIS — D72829 Elevated white blood cell count, unspecified: Secondary | ICD-10-CM | POA: Diagnosis present

## 2010-04-18 DIAGNOSIS — E119 Type 2 diabetes mellitus without complications: Secondary | ICD-10-CM | POA: Diagnosis present

## 2010-04-18 HISTORY — DX: Essential (primary) hypertension: I10

## 2010-04-18 LAB — COMPREHENSIVE METABOLIC PANEL
ALT: 23 U/L (ref 0–53)
Albumin: 3.4 g/dL — ABNORMAL LOW (ref 3.5–5.2)
Alkaline Phosphatase: 56 U/L (ref 39–117)
BUN: 20 mg/dL (ref 6–23)
Chloride: 102 mEq/L (ref 96–112)
Potassium: 4.9 mEq/L (ref 3.5–5.1)
Sodium: 135 mEq/L (ref 135–145)
Total Bilirubin: 1.4 mg/dL — ABNORMAL HIGH (ref 0.3–1.2)
Total Protein: 5.8 g/dL — ABNORMAL LOW (ref 6.0–8.3)

## 2010-04-18 LAB — CBC
HCT: 41.5 % (ref 39.0–52.0)
Hemoglobin: 14.5 g/dL (ref 13.0–17.0)
MCHC: 34.9 g/dL (ref 30.0–36.0)
RBC: 4.56 MIL/uL (ref 4.22–5.81)
WBC: 17.1 10*3/uL — ABNORMAL HIGH (ref 4.0–10.5)

## 2010-04-18 LAB — CK TOTAL AND CKMB (NOT AT ARMC): CK, MB: 2.3 ng/mL (ref 0.3–4.0)

## 2010-04-18 LAB — DIFFERENTIAL
Basophils Absolute: 0 10*3/uL (ref 0.0–0.1)
Lymphocytes Relative: 5 % — ABNORMAL LOW (ref 12–46)
Monocytes Absolute: 1.3 10*3/uL — ABNORMAL HIGH (ref 0.1–1.0)
Neutro Abs: 15 10*3/uL — ABNORMAL HIGH (ref 1.7–7.7)
Neutrophils Relative %: 87 % — ABNORMAL HIGH (ref 43–77)

## 2010-04-18 LAB — POCT CARDIAC MARKERS
CKMB, poc: <1 ng/mL — ABNORMAL LOW (ref 1.0–8.0)
Troponin i, poc: <0.05 ng/mL (ref 0.00–0.09)

## 2010-04-18 MED ORDER — IOHEXOL 300 MG/ML  SOLN
100.0000 mL | Freq: Once | INTRAMUSCULAR | Status: AC | PRN
  Administered 2010-04-18: 100 mL via INTRAVENOUS

## 2010-04-19 LAB — CLOSTRIDIUM DIFFICILE BY PCR: Toxigenic C. Difficile by PCR: NEGATIVE

## 2010-04-19 LAB — BASIC METABOLIC PANEL
CO2: 27 mEq/L (ref 19–32)
Calcium: 8.4 mg/dL (ref 8.4–10.5)
Creatinine, Ser: 1.09 mg/dL (ref 0.4–1.5)
GFR calc Af Amer: >60 mL/min (ref >60)
GFR calc non Af Amer: >60 mL/min (ref >60)
Sodium: 138 mEq/L (ref 135–145)

## 2010-04-19 LAB — HEMOGLOBIN A1C
Hgb A1c MFr Bld: 7.7 % — ABNORMAL HIGH (ref <5.7)
Mean Plasma Glucose: 174 mg/dL — ABNORMAL HIGH (ref <117)

## 2010-04-19 LAB — GLUCOSE, CAPILLARY
Glucose-Capillary: 171 mg/dL — ABNORMAL HIGH (ref 70–99)
Glucose-Capillary: 197 mg/dL — ABNORMAL HIGH (ref 70–99)
Glucose-Capillary: 322 mg/dL — ABNORMAL HIGH (ref 70–99)

## 2010-04-19 LAB — CARDIAC PANEL(CRET KIN+CKTOT+MB+TROPI)
CK, MB: 3.2 ng/mL (ref 0.3–4.0)
CK, MB: 3.5 ng/mL (ref 0.3–4.0)
Relative Index: 1.7 (ref 0.0–2.5)
Relative Index: 1.7 (ref 0.0–2.5)
Total CK: 189 U/L (ref 7–232)
Total CK: 204 U/L (ref 7–232)

## 2010-04-19 LAB — DIFFERENTIAL
Basophils Absolute: 0 10*3/uL (ref 0.0–0.1)
Basophils Relative: 0 % (ref 0–1)
Eosinophils Absolute: 0 10*3/uL (ref 0.0–0.7)
Eosinophils Relative: 0 % (ref 0–5)
Neutrophils Relative %: 78 % — ABNORMAL HIGH (ref 43–77)

## 2010-04-19 LAB — CORTISOL: Cortisol, Plasma: 7.5 ug/dL

## 2010-04-19 LAB — CBC
MCV: 93.6 fL (ref 78.0–100.0)
Platelets: 158 10*3/uL (ref 150–400)
RBC: 4.21 MIL/uL — ABNORMAL LOW (ref 4.22–5.81)
RDW: 12.8 % (ref 11.5–15.5)
WBC: 9.6 10*3/uL (ref 4.0–10.5)

## 2010-04-19 LAB — TSH: TSH: 0.46 u[IU]/mL (ref 0.350–4.500)

## 2010-04-21 NOTE — H&P (Signed)
NAME:  Derek Hogan, Derek Hogan NO.:  [id number]  MEDICAL RECORD NO.:  [id number]           PATIENT TYPE:  E  LOCATION:  [facility]                         FACILITY:  [facility]  PHYSICIAN:  Della Goo, M.D. DATE OF BIRTH:  1945/12/11  DATE OF ADMISSION:  04/18/2010 DATE OF DISCHARGE:                             HISTORY & PHYSICAL   PRIMARY CARE PHYSICIAN:  Unassigned, the patient receives his care at the [facility] in [place], [place].  CARDIOLOGIST:  [name], MD, [facility].  CHIEF COMPLAINT:  Weakness.  HISTORY OF PRESENT ILLNESS:  This is a 65 year old male who was brought to the emergency department by his family secondary to worsening weakness which began upon awakening.  The patient reports feeling "crummy."  He had symptoms of pain in his neck, back, arms, and legs. The patient states that he undergoes a workout four times a week where he does elliptical training and rides a bicycle and has done so for quite awhile.  He denies doing any new activities.  He denies having any fevers but does report having nausea and sweats.  He reports having three loose stools and denies seeing any blood in the stool.  He denies having any black stool.  He also denies having any vomiting.  The patient denies having any chest pain or shortness of breath, but he does have a cardiac history.  He suffered a myocardial infarction in 1996 and underwent a coronary artery bypass grafting, and he states that when he suffered the heart attack, he did not have chest pain at that time.  The patient contacted [facility] in the afternoon and was advised to come to the emergency department secondary to his symptoms.  In the emergency department, the patient was evaluated and on arrival, his blood pressure was found to be 74/48.  The patient had a heart rate of 98-101, and he was administered IV fluids for fluid resuscitation. His  blood pressure did respond to IV fluids and improved.  His laboratory studies in the emergency department revealed an elevated white count of 17.1.  The other remarkable findings were, point-of-care cardiac markers were negative, EKG revealed nonacute findings; however, there is no previous EKG to compare in the system at this time.  PAST MEDICAL HISTORY:  Significant for: 1. Coronary artery disease status post coronary artery bypass     grafting. 2. Insulin-dependent diabetes mellitus status post insulin pump. 3. Dyslipidemia. 4. Gastroesophageal reflux disease. 5. Status post cataract surgery. 6. Status post carotid endarterectomy of the right carotid.  MEDICATIONS:  At this time include: 1. Aspirin 325 mg one p.o. daily. 2. Atenolol 12.5 mg one p.o. q.a.m. 3. Atorvastatin 40 mg one p.o. nightly. 4. Zetia 10 mg one p.o. nightly. 5. Lisinopril 10 mg p.o. nightly. 6. Niacin 1500 mg one p.o. nightly. 7. Prevacid dose needs to be further verified p.o. daily.  ALLERGIES:  No known drug allergies.  SOCIAL HISTORY:  The patient is married with children.  He is a nonsmoker.  He has a remote history of smoking, he quit in 1996.  He has a remote history of alcoholism.  He quit on Halloween in 1982.  He denies any illicit drug usage.  FAMILY HISTORY:  Positive for coronary artery disease and diabetic disease in both parents.  REVIEW OF SYSTEMS:  Pertinents are mentioned above.  PHYSICAL EXAMINATION FINDINGS:  GENERAL:  This is a morbidly obese 28- year-old Caucasian male who is in no discomfort or acute distress currently. VITAL SIGNS:  Initially, temperature 99.3; blood pressure 74/48, now the blood pressure is 101/50; heart rate 98; respirations 14; O2 sats 99- 100%. HEENT:  Normocephalic, atraumatic.  Pupils equally round and reactive to light.  Extraocular movements are intact.  Funduscopic benign.  There is no scleral icterus.  Nares are patent bilaterally.  Oropharynx is  clear. NECK:  Supple.  Full range of motion.  No thyromegaly, adenopathy, or jugular venous distention. CARDIOVASCULAR:  Regular rate and rhythm.  No murmurs, gallops, or rubs appreciated. LUNGS:  Clear to auscultation bilaterally.  No rales, rhonchi, or wheezes. ABDOMEN:  Positive bowel sounds, soft, nontender, nondistended.  Obese. No hepatosplenomegaly. EXTREMITIES:  Without cyanosis, clubbing, or edema. NEUROLOGIC:  The patient is alert and oriented x3.  Cranial nerves are intact.  Motor and sensory function also intact.  Generalized weakness, however, this has improved after some rehydration.  The patient has no focal deficits on examination.  LABORATORY STUDIES:  White blood cell count 17.1, hemoglobin 14.5, hematocrit 41.5, MCV 91.0, platelets 193, neutrophils 87%, lymphocytes 5%.  Sodium 135, potassium 4.9, chloride 102, carbon dioxide 25, BUN 20, creatinine 1.27, and glucose 289, albumin 3.4, AST 23, ALT 23.  Point-of- care cardiac markers with a myoglobin of 70.6, CK-MB less than 1.0, troponin less than 0.05.  EKG reveals a normal sinus rhythm.  There is a right bundle-branch block which is seen; however, this is possibly a chronic finding since his coronary artery disease in 1996.  The patient also has T-wave inversion which is seen in leads V1 and V2.  ASSESSMENT:  This is a 65 year old male being admitted with: 1. Hypotension. 2. Leukocytosis. 3. Hyperglycemia. 4. Coronary artery disease. 5. Early gastroenteritis.  PLAN:  The patient will be admitted to telemetry area for monitoring. Cardiac enzymes will be performed.  The patient will continue on IV fluids for rehydration therapy, his blood pressures will be monitored. The patient's blood pressure medications will be held at this time. Antiemetics will be ordered.  The patient will be monitored for further signs and symptoms of nausea, vomiting, or diarrhea.  He will be placed on an isolation.  His insulin pump  will be placed on hold for now and sliding scale insulin coverage will be ordered.  The patient's regular medications will be further verified and reconciled.  The patient is a full code and further workup will ensue pending results, the patient's clinical course.     Della Goo, M.D.     HJ/MEDQ  D:  04/18/2010  T:  04/18/2010  Job:  [id number]  Electronically Signed by Della Goo M.D. on 04/21/2010 09:57:35 AM

## 2010-04-28 NOTE — Discharge Summary (Signed)
NAME:  Derek Hogan, Derek Hogan NO.:  [id number]  MEDICAL RECORD NO.:  [id number]           PATIENT TYPE:  I  LOCATION:  4733                         FACILITY:  [facility]  PHYSICIAN:  Ramiro Harvest, MD    DATE OF BIRTH:  02-19-1945  DATE OF ADMISSION:  04/18/2010 DATE OF DISCHARGE:  04/19/2010                        DISCHARGE SUMMARY - REFERRING   PRIMARY CARE PHYSICIAN:  Dr. Tamera Punt of the [place] in [place], [place].  CARDIOLOGY:  [name], MD of [facility].  DISCHARGE DIAGNOSES: 1. Hypotension secondary to volume depletion/dehydration, resolved. 2. Reactive leukocytosis, resolved. 3. Viral gastroenteritis, improved. 4. Type 2 diabetes, hemoglobin A1c 7.7. 5. Gastroesophageal reflux disease. 6. Coronary artery disease, status post CABG. 7. Insulin dependent diabetes mellitus, on insulin pump. 8. Dyslipidemia. 9. Status post cataract surgery. 10.Status post carotid endarterectomy of the right carotid.  DISCHARGE MEDICATIONS: 1. Ambien 10 mg p.o. at bedtime. 2. Aspirin 325 mg p.o. at bedtime. 3. Atenolol 25 mg half a tablet p.o. q.a.m. to resume in 1-2 days. 4. Atorvastatin 40 mg p.o. at bedtime. 5. Zetia 10 mg p.o. at bedtime. 6. Insulin pump. 7. Lisinopril 10 mg p.o. at bedtime to resume in 1-2 days. 8. Niacin 750 mg 2 tablets p.o. at bedtime. 9. Omeprazole 20 mg p.o. q.a.m.  DISPOSITION AND FOLLOWUP:  The patient will be discharged home.  The patient is to follow up with his PCP as scheduled this week and follow- up.  The patient's blood pressure will need to be reassessed.  The patient will need a repeat BMET as well as CBC to follow up on electrolytes and renal function.  CONSULTATIONS DONE:  None.  PROCEDURES PERFORMED:  A CT angiogram of the chest was done on April 18, 2010 that showed no evidence for centrally obstructing/major pulmonary embolus.  No aortic dissection or aneurysm, COPD with previous CABG and cardiomegaly, hiatal  hernia and hepatomegaly.  BRIEF ADMISSION HISTORY AND PHYSICAL:  Mr. Derek Hogan is a 65 year old gentleman who was brought to the ED by his family secondary to worsening weakness, which began up on awakening.  The patient reported feeling clammy.  He had symptoms of pain, his neck, back, arms and legs. The patient stated that he undergoes a workout four times weekly.  He does elliptical training and rides a bicycle and has done so far for quite a while.  The patient denied doing any new activities.  Denies having any fevers, but does report having nausea and sweats.  Reports having 3 loose stools.  Denies seeing any blood in stool.  Denies any black stools.  Denied having any vomitus.  The patient denied having any chest pain or shortness of breath, but does have a cardiac history.  The patient suffered an MI in 1996, underwent a CABG and states that when he suffered his heart attack, he did not have a chest pain at that time. The patient contacted [facility] in the afternoon and was advised to present to the ED secondary to his symptoms.  In the ED, the patient was evaluated.  On arrival, systolic blood pressure was found to be 74/48.  The patient  had a heart rate of 98-101.  He was administered IV fluids for fluid resuscitation.  Blood pressure did respond to IV fluids and improved.  His labs in the ED revealed an elevated white count of 17.1.  Other remarkable findings were point-of-care cardiac markers were negative.  EKG revealed no acute findings.  However, there was no previous EKG to compare.  For the rest of admission history and physical, please see H and P dictated per Dr. Lovell Sheehan of job number [phone] [id number].  HOSPITAL COURSE: 1. Hypotension.  The patient was admitted with hypotension.  It was     felt likely secondary to volume depletion from GI losses as well as     dehydration secondary to recent intense workup.  Due to the     patient's cardiac history, he was  placed on a tele floor, placed on     IV fluids, placed on supportive care and antiemetics.  Cardiac     enzymes were cycled, which were negative x3.  Random cortisol     levels which was obtained was within normal limits at 7.5.  TSH     which was obtained was within normal limits at 0.460.  The patient     was maintained on IV fluids, remained asymptomatic.  The patient     improved clinically.  His weakness improved.  His systolic blood     pressure improved.  The patient's IV fluids were discontinued     around 1300 hours on the day of discharge and his blood pressure     was monitored for the rest of the afternoon.  The patient's blood     pressure systolic ranged in the 130s.  The patient was in stable     condition.  The patient was tolerating p.o.  Did not have any     further loose stools.  The patient improved clinically and the     patient wanted to be discharged home.  The patient is currently in     stable condition.  He will be discharged home in stable and     improved condition to follow up with his PCP as an outpatient.  The     patient's blood pressure medications were held during the     hospitalization and the patient has been advised to resume his     blood pressure medications in the next 1-2 days and to follow up     with his PCP as an outpatient.  His reactive leukocytosis on     admission, the patient was noted to have a leukocytosis.  CT of the     chest, which was done was negative for PE.  The patient's     leukocytosis improved and had normalized by day of discharge that     were down to 9.6. 2. Viral gastroenteritis.  On admission, the patient had some     complaints of nausea and loose stools.  Stool studies were obtained     and C diff, PCR which was obtained was negative.  The patient did     not have any further loose stools during the hospitalization.  He     was placed on antiemetic therapy as well as supportive care and the     patient remained in  stable condition, will be discharged in stable     and improved condition.  The rest of the patient's chronic medical     issues remained stable throughout the hospitalization and the  patient will be discharged in stable and improved condition.  VITAL SIGNS:  On day of discharge vital signs temperature 97.7, pulse of 72, respirations 20, blood pressure of 130/70, satting 97% on room air.  DISCHARGE LABS:  Hemoglobin A1c 7.7.  C diff PCR was negative.  BMET sodium 138, potassium 4.5, chloride 106, bicarb 27, glucose 169, BUN 19, creatinine 1.09 and a calcium of 8.4.  CBC with a white count of 9.6, hemoglobin 13.5, hematocrit 39.4 with a platelet count of 158 with an ANC of 7.5.  TSH was within normal limits at 0.460.  It has been a pleasure taking care of Mr. [employer].     Ramiro Harvest, MD     DT/MEDQ  D:  04/19/2010  T:  04/19/2010  Job:  [id number]  cc:   [name], MD Dr. Tamera Punt  Electronically Signed by Ramiro Harvest MD on 04/28/2010 12:11:30 PM

## 2010-05-01 ENCOUNTER — Ambulatory Visit: Payer: Medicare Other | Admitting: Internal Medicine

## 2010-05-04 ENCOUNTER — Telehealth: Payer: Self-pay

## 2010-05-04 NOTE — Telephone Encounter (Signed)
Given DM2; I would prefer he stays on lisinopril but was only on 5 mg daily and BP didn't tolerate so ok to stop.

## 2010-05-04 NOTE — Telephone Encounter (Signed)
BP readings: 3/17 9:15am 110/50, 3/18 8am 126/60 HR 80, 11pm 139/59, 3/19 7am 108/50 HR 80, 4p 112/52 HR 70, 6pm 118/56 HR 70, 10p 119/54 HR 62, 3/20 8am 121/60 HR 78, 10pm 125/59 HR 77, 3/21 BP 110/63 HR 73.  Pt states when he was discharged from hospital off Lisinopril, and pt states BP and HR have been stable off Lisinopril.  Is it OK to be off Lisinopril? Or should he resume?  Please call pt and advise.  Thanks [employer]

## 2010-05-05 NOTE — Telephone Encounter (Signed)
Attempted to contact pt, LMOM TCB.  

## 2010-05-08 ENCOUNTER — Telehealth: Payer: Self-pay | Admitting: *Deleted

## 2010-05-08 NOTE — Telephone Encounter (Signed)
Pt notified, he has f/u with Dr. Gala Romney next week.

## 2010-05-08 NOTE — Telephone Encounter (Signed)
Attempted to contact pt, LMOM TCB.  

## 2010-05-10 LAB — POCT I-STAT GLUCOSE: Glucose, Bld: 215 mg/dL — ABNORMAL HIGH (ref 70–99)

## 2010-05-12 ENCOUNTER — Encounter: Payer: Self-pay | Admitting: Internal Medicine

## 2010-05-15 ENCOUNTER — Encounter: Payer: Self-pay | Admitting: Internal Medicine

## 2010-05-15 ENCOUNTER — Encounter: Payer: Self-pay | Admitting: *Deleted

## 2010-05-16 ENCOUNTER — Ambulatory Visit (INDEPENDENT_AMBULATORY_CARE_PROVIDER_SITE_OTHER): Payer: Medicare Other | Admitting: Internal Medicine

## 2010-05-16 ENCOUNTER — Encounter: Payer: Self-pay | Admitting: Internal Medicine

## 2010-05-16 VITALS — BP 112/56 | HR 60 | Resp 18 | Ht 72.0 in | Wt 243.0 lb

## 2010-05-16 DIAGNOSIS — I6529 Occlusion and stenosis of unspecified carotid artery: Secondary | ICD-10-CM

## 2010-05-16 DIAGNOSIS — I2510 Atherosclerotic heart disease of native coronary artery without angina pectoris: Secondary | ICD-10-CM

## 2010-05-16 DIAGNOSIS — I959 Hypotension, unspecified: Secondary | ICD-10-CM

## 2010-05-16 DIAGNOSIS — E785 Hyperlipidemia, unspecified: Secondary | ICD-10-CM

## 2010-05-16 NOTE — Progress Notes (Signed)
HPI:  Derek Hogan is a delightful 65 year old male with a history of coronary artery disease status post previous inferior wall myocardial infarction followed by bypass surgery in 1996.  Last cardiac catheterization was in Nov 2010 for Myoview with mild inferior ischemia. This showed three-vessel coronary artery disease with patent grafts. There was significant lesion in distal RCA after the graft insertion which compromised flow to distal PL. There was also some mild flow to this region antegrade via native vessel.  I reviewed with Dr. Excell Seltzer and while ths area may be a source of mild ischemia we felt that trying to angioplasty the distal native RCA might compromise the flow in the SVG-PDA via competitive flow. Thus we elected to continue medical management.  Recently admitted to [facility] for hypotension (74/38) due to volume depletion. Got several liters of fluid and felt better. Now back to baseline. Exercising on a daily basis. No CP or undue dyspnea. No claudication. Trying to hydrate prior to exercise better. No neuro sx.  ROS: All systems negative except as listed in HPI, PMH and Problem List.  Past Medical History  Diagnosis Date  . Diabetes mellitus     type 2  . Hypertension   . CAD, ARTERY BYPASS GRAFT 06/09/2008  . UNSPECIFIED PERIPHERAL VASCULAR DISEASE 10/06/2008  . HYPERTENSION, UNSPECIFIED 06/09/2008  . HYPERLIPIDEMIA-MIXED 06/09/2008  . DM 10/01/2008  . ERECTILE DYSFUNCTION 06/09/2008  . Hypotension   . Viral gastroenteritis     04/19/2010 improved  . Dyslipidemia   . GERD (gastroesophageal reflux disease)   . Weakness   . Heart attack   . PVD (peripheral vascular disease)     -- s/p right carotid endarterectomy by Dr. Liliane Bade -- carotid u/s 0-39% (9/10) -- ABI L 0.95 R 1.0 (2/07)  . Erectile dysfunction     Current Outpatient Prescriptions  Medication Sig Dispense Refill  . aspirin 325 MG tablet Take 325 mg by mouth daily.        Marland Kitchen atenolol (TENORMIN) 25 MG tablet Take 12.5 mg  by mouth daily.        Marland Kitchen atorvastatin (LIPITOR) 40 MG tablet Take 40 mg by mouth daily.        Marland Kitchen ezetimibe (ZETIA) 10 MG tablet Take 10 mg by mouth daily.        . insulin aspart (NOVOLOG) 100 UNIT/ML injection as directed.        Marland Kitchen lisinopril (PRINIVIL,ZESTRIL) 20 MG tablet Take 20 mg by mouth. Take 1/4 of the tablet once a day       . niacin (NIASPAN) 750 MG CR tablet Take 750 mg by mouth 2 (two) times daily.       . NON FORMULARY as directed. Insulin pump       . omeprazole (PRILOSEC) 20 MG capsule Take 20 mg by mouth daily.        . sildenafil (VIAGRA) 100 MG tablet Take 100 mg by mouth daily as needed.        . zolpidem (AMBIEN) 5 MG tablet Take 5 mg by mouth at bedtime as needed.        Marland Kitchen DISCONTD: lisinopril (PRINIVIL,ZESTRIL) 5 MG tablet Take 10 mg by mouth daily.          PHYSICAL EXAM: Filed Vitals:   05/16/10 1135  BP: 112/56  Pulse: 60  Resp: 18   General:  Well appearing. No resp difficulty HEENT: normal Neck: supple. JVP flat. Carotids 2+ bilaterally; no bruits. No lymphadenopathy or thryomegaly appreciated. Cor:  PMI normal. Regular rate & rhythm. No rubs, gallops or murmurs. Lungs: clear Abdomen: soft, nontender, nondistended. No hepatosplenomegaly. No bruits or masses. Good bowel sounds. Extremities: no cyanosis, clubbing, rash, edema Neuro: alert & orientedx3, cranial nerves grossly intact. Moves all 4 extremities w/o difficulty. Affect pleasant.    ASSESSMENT & PLAN:

## 2010-05-16 NOTE — Patient Instructions (Signed)
Your physician wants you to follow-up in: 4 months. You will receive a reminder letter in the mail two months in advance. If you don't receive a letter, please call our office to schedule the follow-up appointment.  

## 2010-05-21 NOTE — Assessment & Plan Note (Signed)
No evidence of ischemia. Continue current regimen.   

## 2010-05-21 NOTE — Assessment & Plan Note (Signed)
Asymptomatic. Due for f/u u/s at next visit.

## 2010-05-21 NOTE — Assessment & Plan Note (Signed)
Lipids look good. LDL at goal. (< 70). Continue statin.

## 2010-05-21 NOTE — Assessment & Plan Note (Signed)
Improved. Discussed need to remain hydrated. Also suggested salt liberalization.

## 2010-06-01 NOTE — Telephone Encounter (Signed)
Opened in error

## 2010-06-06 ENCOUNTER — Encounter: Payer: Self-pay | Admitting: Internal Medicine

## 2010-06-20 NOTE — Procedures (Signed)
CAROTID DUPLEX EXAM   INDICATION:  Follow up right carotid endarterectomy.   HISTORY:  Diabetes:  Yes.  Cardiac:  CABG.  Hypertension:  Yes.  Smoking:  Previous.  Previous Surgery:  Right carotid endarterectomy on 03/05/02.  CV History:  No.  Amaurosis Fugax No, Paresthesias No, Hemiparesis No.                                       RIGHT             LEFT  Brachial systolic pressure:         128               130  Brachial Doppler waveforms:         Normal            Normal  Vertebral direction of flow:        Antegrade         Antegrade  DUPLEX VELOCITIES (cm/sec)  CCA peak systolic                   87                99  ECA peak systolic                   93                104  ICA peak systolic                   99                80  ICA end diastolic                   30                21  PLAQUE MORPHOLOGY:                  Heterogenous      Heterogenous  PLAQUE AMOUNT:                      Mild              Mild  PLAQUE LOCATION:                    CCA               ICA/ECA   IMPRESSION:  1. Patent right carotid endarterectomy site with no evidence of      stenosis.  2. 1-39% stenosis of the left internal carotid artery.  3. No significant change noted when compared to the previous      examination on 12/13/05.   ___________________________________________  P. Liliane Bade, M.D.   CH/MEDQ  D:  10/16/2007  T:  10/16/2007  Job:  [id number]

## 2010-06-20 NOTE — Cardiovascular Report (Signed)
NAME:  [name], [name] NO.:  [id number]   MEDICAL RECORD NO.:  [id number]          PATIENT TYPE:  OIB   LOCATION:  1962                         FACILITY:  [facility]   PHYSICIAN:  [name], MDDATE OF BIRTH:  21-Sep-1945   DATE OF PROCEDURE:  07/31/2006  DATE OF DISCHARGE:                            CARDIAC CATHETERIZATION   PRIMARY CARE:  Dr. Nada Boozer at the [facility].   PATIENT IDENTIFICATION:  Derek Hogan is a delightful 65 year old male  with a history of coronary artery disease status post bypass surgery in  1996.  He has recently been having some mild atypical chest pain, did a  Myoview showed possibility of minimal ischemia in the distal anterior  wall and apex.  He has also been having some claudication, so he was  brought to the catheterization laboratory for diagnostic angiography.   PROCEDURES PERFORMED:  1. Selective coronary angiography.  2. LIMA angiography.  3. Saphenous vein graft angiography x2.  4. Left heart catheterization.  5. Ventriculogram.  6. Abdominal aortogram.   DESCRIPTION OF PROCEDURE:  The risks and benefits of the catheterization  were explained.  Consent was signed and placed on the chart.  A 4-French  arterial sheath was placed in the right femoral artery using a modified  Seldinger technique.  A JL-4 catheter, 3-D RCA left coronary bypass and  angled pigtail catheter were used for procedure.  There were no apparent  complications.   Central aortic pressure 123/56 with a mean of 84.  LV pressure is 124/5  with an EDP of 12.  There is no aortic stenosis.   Left main was normal.   LAD was totally occluded in the mid section after the takeoff of a large  diagonal and a septal perforator.  There is an 80% proximal lesion in  the LAD.   Left circumflex gave off a small ramus, a small OM-1, a large OM-2, and  a moderate-sized OM-3.  There was a 50% lesion in the proximal left  circumflex.  The OM-2 was totally  occluded.  There was mild diffuse  disease in the OM-3.   The right coronary artery was a dominant vessel, had diffuse 60-70%  lesion throughout the mid section, then was totally occluded distally  before the takeoff of the PDA.   The LIMA to the LAD was a small vessel but patent without any  significant stenosis.  It provided good flow to the LAD.  The LAD itself  was moderate-size vessel.   Saphenous vein graft to the diagonal was widely patent.   Saphenous vein graft to the OM-2 was widely patent and it back-filled  the distal circumflex.   Saphenous vein graft to the PDA had minor luminal irregularities  throughout the body of the graft but provided good flow to the PDA.   Left ventriculogram done the RAO position showed an EF of 45-50% with  inferior hypokinesis.   Abdominal aortogram done to evaluate claudication showed dual renal  arteries on the left which were widely patent.  The right renal artery  was patent as well.  There was no abdominal  aortic aneurysm.  The  abdominal aorta and iliacs were free of significant plaquing.   ASSESSMENT:  1. Three-vessel coronary artery disease.  2. All grafts patent.  3. Ejection fraction 45-50%.  4. Patent renal arteries.  5. Patent iliacs with no abdominal aortic aneurysm.   PLAN/DISCUSSION:  I have reassured Mr. Magnussen.  We will continue with  medical therapy.      [name], MD  Electronically Signed     DRB/MEDQ  D:  07/31/2006  T:  07/31/2006  Job:  [id number]   cc:   Nada Boozer, MD

## 2010-06-20 NOTE — Assessment & Plan Note (Signed)
[facility] OFFICE NOTE   Derek Hogan, Derek Hogan                    MRN:          [id number]  DATE:03/14/2007                            DOB:          1945-03-09    PRIMARY CARE PHYSICIAN:  Dr. Nada Boozer at the [facility].   INTERVAL HISTORY:  Derek Hogan is a delightful 65 year old male with a  history of coronary artery disease, status post previous inferior wall  myocardial infarction, followed by bypass surgery 1996.  He also has a  history of peripheral vascular disease, status post right  endarterectomy, hypertension, hyperlipidemia and diabetes, for which he  recently started on an insulin pump.  Last catheterization was in June  2008, showed three-vessel coronary artery disease with patent grafts.  EF was 45% to 50%.   He is here for routine follow-up.  He is doing great.  He continues to  go the gym and do a walking program and also be on the elliptical.  He  does have some occasional claudication, but nothing limiting.  Denies  any chest pain.   CURRENT MEDICATIONS:  1. Aspirin 325 a day.  2. Niacin 1500.  3. Atenolol 25.  4. Prilosec 20.  5. Zetia 5 mg a day.  6. NovoLog insulin pump.  7. Lisinopril 20 a day.  8. Atorvastatin 40 a day.  9. Lasix 20 a day.   PHYSICAL EXAMINATION:  GENERAL:  He is well-appearing, no acute  distress.  He ambulates around the clinic without respiratory  difficulty.  VITAL SIGNS:  Blood pressure is 112/60, heart rate is 58, weight 262.  HEENT:  Normal.  NECK:  Supple.  He has no JVD.  He has a previous right carotid  endarterectomy scar.  No bruits.  No lymphadenopathy or thyromegaly.  CARDIAC:  PMI is nondisplaced.  Regular rate and rhythm with distant  heart sounds.  No murmurs, rubs or gallops.  LUNGS:  Clear.  ABDOMEN:  Soft, obese, nontender, nondistended, but no obvious past  thyromegaly.  No bruits, no masses.  Good bowel sounds.  EXTREMITIES:  Warm with no cyanosis, clubbing or edema.  No ulceration or rash.  NEUROLOGIC:  He is alert and oriented x3.  Cranial nerves II-XII are  intact.  Moves all 4 extremities without difficulty.  Affect is normal.   ASSESSMENT/PLAN:  1. Coronary artery disease.  He is doing well without any events of      ischemia.  Continue current therapy.  2. Hypertension, well-controlled.  3. Hyperlipidemia.  Most recent lipids look great.  His LDL is below      70.  His HDL is 41.  4. Peripheral vascular disease.  This is stable.  He follows with Dr.      Madilyn Fireman for his carotid disease.   DISPOSITION:  We will see him back in clinic in 3 months.  I have  encouraged him to keep his exercise program going.     [name], MD  Electronically Signed    DRB/MedQ  DD: 03/14/2007  DT: 03/15/2007  Job #: [id number]  cc:   Dr. Nada Boozer, [facility]

## 2010-06-20 NOTE — Assessment & Plan Note (Signed)
[facility] OFFICE NOTE   MUBARAK, BEVENS                    MRN:          [id number]  DATE:02/19/2008                            DOB:          [birth year]    PRIMARY CARE PHYSICIAN:  Dr. Nada Boozer at the [place], [place].   INTERVAL HISTORY:  Derek Hogan is a delightful 65 year old male with a  history of coronary artery disease status post previous inferior wall  myocardial infarction followed by bypass surgery in 1996.  Last cardiac  catheterization was in June 2008 which showed three-vessel native  coronary artery disease with patent grafts.  The EF was 45-50%.  He also  has a history of peripheral vascular disease, status post right carotid  endarterectomy by Dr. Liliane Bade.  Hypertension, hyperlipidemia, and  diabetes which he is on insulin pump.   He returns today for routine followup.  Overall, he is doing fairly  well.  He continues with his walking program.  He denies any chest pain,  but he does note over the past few weeks, he has just been more fatigued  with his exercise and gets occasional dyspnea.  This can comes and goes.  He denies any heart failure symptoms.  No orthopnea.  No PND.  No lower  extremity edema.   CURRENT MEDICATIONS:  1. Aspirin 325 a day.  2. Niacin 1500 a day.  3. Atenolol 25 a day.  4. Prilosec 20 mg a day.  5. Zetia 10 a day.  6. NovoLog insulin.  7. Lisinopril 20 a day.  8. Atorvastatin 40 a day.  9. Lasix 20 a day.   PHYSICAL EXAMINATION:  GENERAL:  He is well-appearing in no acute  distress.  He ambulates on the clinic without any respiratory  difficulty.  VITAL SIGNS:  Blood pressure is 118/62, heart rate is 70, and weight is  249.  HEENT:  Normal.  NECK:  Supple.  There is a well-healed right carotid endarterectomy scar  with soft bruit and he has good pulses.  There is no bruit on the left.  No lymphadenopathy or thyromegaly.  CARDIAC:  PMI is normal.   Regular rate and rhythm.  No murmurs, rubs, or  gallops.  LUNGS:  Clear  ABDOMEN:  Soft, nontender, and nondistended.  No hepatosplenomegaly.  No  bruits.  No masses.  Good bowel sounds.  EXTREMITIES:  Warm with no  cyanosis, clubbing, or edema.  No ulcers.  No rash.  NEUROLOGIC:  Alert and x3.  Cranial nerves II through XII grossly  intact.  Moves all 4 extremities without difficulty.  Affect is  pleasant.   EKG shows normal sinus rhythm at rate of 70.  No ST-T wave  abnormalities.   ASSESSMENT AND PLAN:  1. Coronary artery disease, status post previous bypass surgery.      Overall, I feel he is doing well.  However, he has had some mild      exercise intolerance.  It has been several years since his last      stress test.  We will follow up with an exercise Myoview  to rule      out problems with his grafts.  2. Hypertension, well controlled.  3. Hyperlipidemia.  Last lipids looked good.  His LDL has been less      than 70.  We will continue current therapy.   DISPOSITION:  See him back in 4 months pending the results of his stress  test.     [name], MD  Electronically Signed    DRB/MedQ  DD: 02/19/2008  DT: 02/20/2008  Job #: [id number]

## 2010-06-20 NOTE — Assessment & Plan Note (Signed)
[facility] OFFICE NOTE   Derek Hogan, Derek Hogan                    MRN:          [id number]  DATE:09/09/2006                            DOB:          15-Feb-1945    PRIMARY CARE PHYSICIAN:  Dr. Nada Boozer at the [facility].   INTERVAL HISTORY:  Derek Hogan is a delightful 65 year old man with a  history of coronary artery disease status post previous inferior wall  myocardial infarction, followed by bypass grafting in 1996.  He also has  a history of peripheral vascular disease, status post right carotid  endarterectomy, hypertension, hyperlipidemia and diabetes.  He recently  underwent cardiac catheterization on July 31, 2006, which showed three-  vessel coronary artery disease with patent grafts and EF of 45% to 50%.  His renal arteries were patent.  He returns back for routine followup.   He is doing great.  He denies any chest pain or shortness of breath, has  not any problems with his cath site.  He is planning on going to [place]  in September for a week.   CURRENT MEDICATIONS:  1. Aspirin 325.  2. Niacin 1500.  3. Atenolol 25.  4. Prilosec 20.  5. Zetia 10.  6. NovoLog insulin.  7. Lisinopril 20.  8. Atorvastatin 40.  9. Lasix 20.   PHYSICAL EXAMINATION:  GENERAL:  He is well-appearing, no acute  distress.  He ambulates around the clinic without any respiratory  difficulty.  VITAL SIGNS:  Blood pressure 100/60, heart rate 60, weight is 262.  HEENT:  Normal.  NECK:  Supple, no JVD.  He has a scar on his right neck from carotid  endarterectomy.  No bruits, no masses, no lymphadenopathy or  thyromegaly.  CARDIAC:  PMI is non-displaced, regular rate and rhythm.  Normal S1 and  S2.  No murmurs, rubs or gallops.  LUNGS:  Clear.  ABDOMEN:  Soft, obese, nontender, nondistended.  No obvious  hepatosplenomegaly, no bruits, no masses.  Good bowel sounds.  EXTREMITIES:  Warm with no cyanosis, clubbing  or edema.  No ulceration  or rash.  Groin site looks good with no bruits.  NEURO:  He is alert and oriented x 3.  Cranial nerves 2-12 are intact.  He moves all 4 extremities without difficulty.  Affect is normal.   ASSESSMENT/PLAN:  1. Coronary artery disease.  He is doing well without any evidence of      ischemia.  Recent catheterization looked good.  Continue current      therapy.  2. Hypertension, well controlled.  3. Hyperlipidemia.  Re-check lipids today.  Goal LDL is less than 70.  4. Peripheral vascular disease.  Continue routine followup.  May need      repeat ABIs in the future.  5. Possible sleep apnea.  Currently denies any history of significant      snoring or excessive daytime fatigue.  I have asked his wife to      keep an eye on this.  If he does have heavy snoring, I think he      would have  a low threshold to proceed with a sleep study.   DISPOSITION:  Return to clinic in two months.     [name], MD  Electronically Signed    DRB/MedQ  DD: 09/09/2006  DT: 09/09/2006  Job #: [id number]   cc:   Nada Boozer, M.D. [facility]

## 2010-06-20 NOTE — Assessment & Plan Note (Signed)
[facility] OFFICE NOTE   SUMMIT, ARROYAVE                    MRN:          [id number]  DATE:12/10/2007                            DOB:          08/07/1945    PRIMARY CARE PHYSICIAN:  Dr. Nada Boozer, [facility]   INTERVAL HISTORY:  Derek Hogan is a delightful 65 year old male with a history  of coronary artery disease status post previous inferior wall myocardial  infarction followed by bypass surgery in 1996.  Last cardiac  catheterization was in June 2008, which showed three-vessel coronary  artery disease with patent grafts, EF was 45-50%.  He also has a history  of peripheral vascular disease status post right carotid endarterectomy  by Dr. Liliane Bade, hypertension, hyperlipidemia, and diabetes which he  is on an insulin pump.   He returns today for routine followup.  Unfortunately over the summer,  he fell off a ladder and had several burst vertebral fractures.  He was  in a brace.  Fortunately, he is now doing better.  He is going to the  gym and working out with a Psychologist, educational.  He denies any chest pain or  shortness of breath.  No claudication.  No heart failure.   REVIEW OF SYSTEMS:  Remainder of the review of systems is negative  except for HPI and problem list.   CURRENT MEDICATIONS:  1. Aspirin 325 a day.  2. Niacin 1500 a day.  3. Atenolol 25 a day.  4. Prilosec 20 a day.  5. Zetia 5 mg a day.  6. NovoLog insulin pump.  7. Lisinopril 20 a day.  8. Atorvastatin 40 a day.  9. Lasix 20 a day.   PHYSICAL EXAMINATION:  GENERAL:  He is well appearing in no acute  distress.  Ambulates around the clinic without any respiratory  difficulty.  VITAL SIGNS:  Blood pressure is 114/56, heart rate 64, weight is 245  which is down 17 pounds.  HEENT:  Normal.  NECK:  Supple.  There is a well healed right carotid endarterectomy scar  with a soft bruit on it.  He has good pulses.  There is no bruit on the  left.  No lymphadenopathy or thyromegaly.  CARDIAC:  PMI is normal.  Regular rate and rhythm.  No murmurs, rubs, or  gallops.  LUNGS:  Clear.  ABDOMEN:  Soft, nontender, nondistended.  No hepatosplenomegaly.  No  bruits.  No masses.  Good bowel sounds.  EXTREMITIES:  Warm with no  cyanosis, clubbing, or edema.  No ulcers.  No rash.  NEUROLOGICAL:  Alert and oriented x3.  Cranial nerves II through XII are  intact.  Moves all four extremities without difficulty.  Affect is  pleasant.   LABORATORY DATA:  Creatinine is 0.1.  Liver function tests are normal.  Total cholesterol is 103, triglycerides 51, HDL is 37 which is down from  45, and LDL cholesterol is 56.  Hemoglobin A1c is 7.0.   ASSESSMENT AND PLAN:  1. Coronary artery disease status post previous bypass.  He is doing      well.  No  evidence of ischemia.  2. Hypertension, well controlled.  3. Hyperlipidemia.  LDL is at goal less than 70, HDL is a little bit      low.  We did discuss possibly going up on the Niaspan, but it makes      him feel somewhat jittery at night, so we will keep him where he is      at and recheck in 3-4 months.   DISPOSITION:  He is doing great.  I urged him to continue to exercise  and stay off the ladders.  We will see him back in 3-4 months for  routine followup.     [name], MD  Electronically Signed    DRB/MedQ  DD: 12/10/2007  DT: 12/10/2007  Job #: [id number]

## 2010-06-20 NOTE — Assessment & Plan Note (Signed)
[facility]                            CARDIOLOGY OFFICE NOTE   DIONTE, BLAUSTEIN                    MRN:          [id number]  DATE:11/11/2006                            DOB:          1945-08-09    PRIMARY CARE PHYSICIAN:  Dr. Nada Boozer at the [facility].   INTERVAL HISTORY:  Mr. Pucillo is a delightful 65 year old male with  history of coronary artery disease status post previous inferior wall  myocardial infarction followed by bypass surgery in 1996.  He also has a  history of peripheral vascular disease status post right carotid  endarterectomy, hypertension, hyperlipidemia and diabetes.  He recently  underwent catheterization in June that showed three-vessel coronary  artery disease with patent grafts.  EF was 45-50%.  His renal arteries  were patent.   He is here for routine followup.  He is doing great.  He is walking 45  minutes a day, 5 days a week, without any problems.  He has not had any  chest pain or dyspnea, no claudication, though he does complain of some  shin splints.   CURRENT MEDICATIONS:  1. Aspirin 325 a day.  2. Niaspan 1500 a day.  3. Atenolol 25 a day.  4. Prilosec 20 a day.  5. Zetia 5 mg a day.  6. NovoLog insulin pump.  7. Lisinopril 20 a day.  8. Atorvastatin 40 a day.  9. Lasix 20 a day.   PHYSICAL EXAMINATION:  He is well-appearing, no acute distress.  He  ambulates around the clinic without any respiratory difficulty.  Blood  pressure is 118/66, heart rate is 68, weight is 264 which is stable.  HEENT:  Normal.  NECK:  Supple, no JVD.  He has a scar on his right neck from carotid  endarterectomy.  There are no bruits, no lymphadenopathy or thyromegaly.  CARDIAC:  PMI is nondisplaced.  There is a respiratory rate with normal  S1 and S2.  No murmurs, rubs, or gallops.  LUNGS:  Clear.  ABDOMEN:  Soft, obese, nontender, nondistended.  No obvious  hepatosplenomegaly.  No bruits, no masses, good bowel  sounds.  EXTREMITIES:  Warm with no cyanosis, clubbing or edema.  No ulceration  or rash.  NEUROLOGIC:  He is alert and oriented x3.  Cranial nerves II-XII are  intact.  Moves all four extremities without difficulty.  Affect is  normal.   ASSESSMENT AND PLAN:  1. Coronary artery disease.  He is doing well without any evidence of      ischemia.  Continue current therapy.  I did consider switching his      atenolol over to Toprol-XL just out of preference.  However, he is      stable on this and will continue for now.  2. Hypertension, well controlled.  3. Hyperlipidemia.  LDL is at goal.  We will recheck in 4 months.  4. Peripheral vascular disease.  Continue exercise program.  No      evidence of limb ischemia at this time.  5. Probable sleep apnea.  He denies any history of significant snoring  or excessive daytime fatigue.  His wife is keeping an eye on this.      I have a low threshold to send him for a sleep study as needed.   DISPOSITION:  Return in 4 months.     [name], MD  Electronically Signed    DRB/MedQ  DD: 11/11/2006  DT: 11/11/2006  Job #: [id number]

## 2010-06-20 NOTE — Assessment & Plan Note (Signed)
[facility] OFFICE NOTE   BRAZOS, SANDOVAL                    MRN:          [id number]  DATE:07/14/2007                            DOB:          06/30/1945    PRIMARY CARE PHYSICIAN:  Dr. Nada Boozer the [facility].   INTERVAL HISTORY:  Derek Hogan is a delightful 65 year old male with a  history of coronary artery disease, status post previous inferior wall  myocardial infarction followed by bypass surgery in 1996.  He also has  peripheral vascular disease status post right carotid endarterectomy,  hypertension, hyperlipidemia and diabetes, for which he is on an insulin  pump.  The last cardiac catheterization was June 2008 and showed three-  vessel coronary artery disease with patent grafts, EF was 45-50%.   He is here for routine follow-up.  He is doing great.  Last week he  worked out an hour every day with walking and on a stair climber.  No  significant claudication, no chest pain, no exertional dyspnea no heart  failure.  He has been good with his medications.   CURRENT MEDICATIONS:  1. Aspirin 325 mg.  2. Niaspan 1500 mg a day.  3. Atenolol 25 mg a day.  4. Prilosec 20 mg a day.  5. Zetia 5 mg a day.  6. NovoLog insulin pump.  7. Lisinopril 20 mg a day.  8. Atorvastatin 40 mg a day.  9. Lasix 20 a day.   PHYSICAL EXAM:  Well-appearing, in no acute distress.  Ambulates around  the clinic without any respiratory difficulty.  Blood pressure is 106/58, heart rate is 61, weight is 262.  HEENT:  Normal.  NECK:  Supple.  No JVD.  Previous right endarterectomy  scar.  No bruits, no lymphadenopathy.  No thyromegaly.  CARDIAC:  PMI is nondisplaced.  A regular rate and rhythm with distant  heart sounds.  No murmurs, rubs or gallops.  LUNGS:  Clear.  ABDOMEN:  Soft, obese, nontender, nondistended.  No obvious  hepatosplenomegaly.  No bruits.  No masses appreciated.  Good bowel   sounds.  EXTREMITIES:  Warm with no cyanosis, clubbing or edema.  No ulceration  or rash.  Distal pulses are not well-palpable.  NEUROLOGIC:  He is alert and oriented x3.  Cranial nerves II-XII are  intact.  He moves all four extremities without difficulty.  Affect is  pleasant.   EKG shows normal sinus rhythm at a rate of 61, no ST-T wave  abnormalities.   ASSESSMENT/PLAN:  1. Coronary artery disease.  This is stable.  Continue current      therapy.  We will get an echocardiogram on follow-up in 4 months to      make sure his ejection fraction is holding steady.  2. Hypertension, well-controlled.  3. Hyperlipidemia.  Goal LDL is less than 70.  We will check his      lipids later this week.  4. Peripheral vascular disease, followed by Dr. Madilyn Fireman.   DISPOSITION:  We will see him back in 4 months for routine follow-up.  [name], MD  Electronically Signed    DRB/MedQ  DD: 07/14/2007  DT: 07/14/2007  Job #: [id number]   cc:   Nada Boozer, MD

## 2010-06-20 NOTE — Assessment & Plan Note (Signed)
[facility] OFFICE NOTE   Derek Hogan, Derek Hogan                      MRN:          [id number]  DATE:06/26/2006                            DOB:          03/13/1945    PRIMARY CARE PHYSICIAN:  Dr. Nada Hogan, [facility].   INTERVAL HISTORY:  Derek Hogan is a very pleasant 65 year old male with  a history of coronary artery disease, status post inferior wall  myocardial infarction, followed by bypass surgery in 1996.  He also has  a history of peripheral vascular disease and is status post right  carotid endarterectomy.  Remainder of his medical history is notable for  obesity, hypertension, hyperlipidemia and diabetes.  He has been  previously followed by Dr. Samule Hogan and presents today to establish long-  term care.   He tells me that, overall, he is doing fairly well.  However, he does  note that he gets more winded with just moderate activity, such as going  up a few flights of stairs.  He has not had any chest pain, but does  also note some tingling in his left arm, which he said he had somewhat  with his previous heart attack.  He has been going to the gym every day,  but just lifting weights and not doing really very much cardio activity.  He says some days he can walk up to a mile at a time, slowly, but other  days can only go 25 yards, due to calf and shin pain.  He has not had  lower extremity ulcers.  He does snore when he lies on his back, but  denies any witnessed apnea.  His wife is here today to confirm that.  He  has had just minimal lower extremity edema, no orthopnea or PND, no  palpitations or presyncope.   CURRENT MEDICATIONS:  1. Aspirin 325 a day.  2. Lasix 40 a day.  3. Niacin 1500 a day.  4. Atenolol 25 a day.  5. Prilosec 20 a day.  6. Zetia 10 a day.  7. NovoLog insulin.  8. Lisinopril 20 a day.  9. Atorvastatin 40 a day.   PHYSICAL EXAM:  He is well-appearing, in no acute  distress.  He  ambulates around the clinic without any respiratory difficulty.  Blood  pressure is 114/70, heart rate is 73, weight is 264.  HEENT:  Normal.  NECK:  Supple.  There is no JVD.  He has a prominent scar on his right  neck from his carotid endarterectomy.  There is no bruit, no mass, no  lymphadenopathy or thyromegaly.  CARDIAC:  The PMI is nondisplaced.  He has a regular rate and rhythm  with a normal S1 and S2.  There is no murmur, rub or gallop.  LUNGS:  Clear.  ABDOMEN:  Soft, obese, nontender, nondistended.  No obvious  hepatosplenomegaly, no bruits, no masses.  Good bowel sounds.  EXTREMITIES:  Warm without cyanosis, clubbing or edema.  There is no  ulceration.  He has scars, secondary to his previous saphenous vein  graft harvesting.  NEUROLOGIC:  He is alert and oriented times three.  Cranial nerves II  through XII are intact.  He moves all four extremities without  difficulty.  Affect is mildly flattened.   EKG shows normal sinus rhythm at a rate of 73 with inferior Q-waves.  There are no significant STT-wave abnormalities.   ASSESSMENT AND PLAN:  1. Coronary artery disease, status post bypass surgery.  He does have      decreased exercise tolerance and some tingling in his left hand,      which is nonspecific.  However, given his 65 year old bypass      grafts, I do think it is reasonable to proceed with an exercise      Myoview to evaluate for any ischemia.  We will have that done in      the next week or so.  2. Hypertension, well-controlled.  3. Hyperlipidemia, well-controlled.  His LDL is at goal.  This is      followed by Dr. Orson Hogan.  4. Peripheral vascular disease.  This is followed by Dr. Madilyn Hogan.  5. Obesity and sedentary lifestyle.  I stressed to him the importance      of trying to get more exercise, both for his peripheral vascular      disease and as a way to reduce his risk of cardiac morbidity and      mortality and lose weight.  I suggested,  after we get his stress      test, that he try to do some more walking on the treadmill at the      gym.  6. Lower extremity claudication.  Once again, we recommended exercise.      We will continue to follow.  He will need ABIs again in the future.     Derek Hogan. Bensimhon, MD  Electronically Signed    DRB/MedQ  DD: 06/26/2006  DT: 06/26/2006  Job #: [id number]   cc:   Derek Hogan, M.D.

## 2010-06-20 NOTE — Assessment & Plan Note (Signed)
[facility] OFFICE NOTE   Derek Hogan, Derek Hogan                    MRN:          [id number]  DATE:07/24/2006                            DOB:          1945/10/06    PRIMARY CARE PHYSICIAN:  Dr. Nada Boozer at the [facility].   INTERVAL HISTORY:  Derek Hogan is a very pleasant 65 year old male with  a history of coronary artery disease, status post previous inferior wall  myocardial infarction followed by bypass surgery in 1996.  He also has a  history of peripheral vascular disease, status post right carotid  endarterectomy, hypertension, hyperlipidemia, and diabetes.  He was  previously followed by Dr. Samule Ohm.  He returns today for routine  followup.   At his initial visit with me back in May, he began complaining of some  exertional tingling in his left arm, which was reminiscent of his  previous angina.  Given his 65 year old bypass grafts, we went ahead and  did a Myoview.  He did well on this.  EF was 52%.  There was a previous  inferior basilar infarct with question of very minimal ischemia in the  distal anterior wall and apex.  He walks two miles a day.  He says his  main limitation is some claudication.  He denies any associated chest  pressure or significant dyspnea.  He is going to [place] in September and  is very concerned about the possibility of disease in his bypass grafts.  His blood pressure has been somewhat labile, and he says sometimes after  he walks, blood pressure can be in the 80s.  Typically, his blood  pressure seems to be between 100-120.   CURRENT MEDICATIONS:  1. Aspirin 325 a day.  2. Niacin 1500 a day.  3. Atenolol 25 a day.  4. Prilosec 20 a day.  5. Zetia 10 a day.  6. NovoLog.  7. Lisinopril 20 a day.  8. Atoravastatin 40 a day.  9. Lasix 20 a day.   PHYSICAL EXAMINATION:  GENERAL:  He is in no apparent distress.  He  ambulates around the clinic without any  respiratory difficulty.  VITAL SIGNS:  Blood pressure is 110/56, heart rate 64.  Weight is 260.  HEENT:  Normal.  NECK:  Supple.  No JVD.  He has a prominent scar in his right neck from  his carotid endarterectomy.  No bruit.  No mass.  No lymphadenopathy or  thyromegaly.  CARDIAC:  PMI is nondisplaced.  He has a regular rate and rhythm with  normal S1 and S2.  No murmurs, rubs or gallops.  LUNGS:  Clear.  ABDOMEN:  Soft, obese, nontender, nondistended.  No obvious  hepatosplenomegaly.  No bruits.  No masses.  Good bowel sounds.  EXTREMITIES:  Warm with no clubbing, cyanosis or edema.  There is no  ulceration.  No rash.  NEURO:  He is alert and oriented x3.  Cranial nerves II-XII are intact.  Moves all four extremities without difficulty.  Affect is mildly  flattened.   ASSESSMENT/PLAN:  1. Coronary artery disease with mildly positive stress  test:  I had a      long talk with both he and his wife regarding the possibilities,      including continued medical management or follow-up      catheterization.  I have initially suggested medical management,      but they are both quite concerned that he likely may have recurrent      severe coronary artery disease and have decided to proceed with      cardiac catheterization.  I have versed them at length about the      risks and benefits.  We will perform this next week in the      outpatient laboratory.  2. Hypertension:  Well controlled.  Given his hypotension post-      exercise, I recommended that he may just want to use Lasix on an      every-other-day or as-needed basis.  3. Hyperlipidemia:  Continue current therapy.  4. Claudication:  Previous ABIs a year ago were relatively normal.  We      will recheck them after his catheterization.     [name], MD  Electronically Signed    DRB/MedQ  DD: 07/24/2006  DT: 07/24/2006  Job #: [id number]   cc:   Dr. Nada Boozer, [facility]

## 2010-06-23 NOTE — Op Note (Signed)
NAME:  Derek Hogan, Derek Hogan                       ACCOUNT NO.:  [id number]   MEDICAL RECORD NO.:  [id number]                   PATIENT TYPE:  INP   LOCATION:  2899                                 FACILITY:  [facility]   PHYSICIAN:  Balinda Quails, M.D.                 DATE OF BIRTH:  1945/09/24   DATE OF PROCEDURE:  03/05/2002  DATE OF DISCHARGE:                                 OPERATIVE REPORT   PREOPERATIVE DIAGNOSIS:  Severe right internal carotid artery stenosis.   POSTOPERATIVE DIAGNOSIS:  Severe right internal carotid artery stenosis.   PROCEDURE:  Right carotid endarterectomy, Dacron patch angioplasty.   SURGEON:  Balinda Quails, M.D.   ASSISTANT:  Coral Ceo, P.A.   ANESTHESIA:  General endotracheal.   CLINICAL NOTE:  The patient is a 65 year old diabetic male with a remote  history of tobacco use.  He has previously undergone coronary artery bypass.  He was recently noted to have a right carotid bruit.  Doppler evaluation  revealed a severe stenosis of the right internal carotid artery.  He denied  any recent symptoms related to this.  He was seen and evaluated.  It was  recommended that he undergo right carotid endarterectomy for reduction of  stroke risk.  He consented for surgery.  Major morbidity and mortality  associated with this surgery of approximately 1-2% to include but not  limited to MI, CVA, cranial nerve injury, and death.   DESCRIPTION OF PROCEDURE:  Patient brought to the operating room in stable  hemodynamic condition.  General endotracheal anesthesia induced.  Arterial  line and Foley catheter in place.  Right neck prepped and draped in a  sterile fashion.   A curvilinear skin incision made along the anterior border in the right  sternomastoid muscle.  Subcutaneous tissue and platysma divided with  electrocautery.  Deep dissection carried along the anterior border of the  sternomastoid to the carotid sheath.  The common carotid artery was  mobilized  proximally down to the omohyoid muscle and encircled with a vessel  loop.  Distal dissection carried up to the carotid bifurcation.  The  superior thyroid and external carotid were encircled with vessel loops.  The  internal carotid artery followed distally up to the posterior belly of the  digastric muscle.  The hypoglossal nerve reflected superiorly and preserved.  The vagus nerve reflected posteriorly and preserved.  The internal carotid  artery encircled distally with a vessel loop.   Evaluation of the carotid bifurcation revealed calcified plaque disease  extending into the origin of the right internal carotid artery.  The patient  administered 7000 units of heparin intravenously.  Adequate circulation time  permitted.  The carotid vessels controlled with clamps.  A longitudinal  arteriotomy made in the distal common carotid artery.  The arteriotomy  extended across the carotid bulb and up into the internal carotid artery  beyond the plaque disease.  There  was severe plaque disease at the carotid  bifurcation with a high-grade stenosis, greater than 80%.  The plaque was  ulcerated.  A shunt was then inserted.   The plaque removed with an endarterectomy elevator.  The endarterectomy  carried down into the common carotid artery and the plaque was divided  transversely with Potts scissors.  The plaque then raised up into the bulb  and the superior thyroid and external carotid were endarterectomized using  an eversion technique.  The distal internal carotid artery plaque then  removed and feathered out well distally.  Fragments of plaque removed with  plaque forceps.  The site irrigated with heparin-saline solution.   A Dacron patch was then placed over the endarterectomy site using running 6-  0 Prolene suture.  At completion of this, the shunt was removed.  All  vessels well-flushed and initial antegrade flow directed up the external  carotid artery.  The internal carotid artery then  released.  Excellent pulse  and Doppler signal in the distal internal carotid artery.  The patient  administered 50 mg of protamine intravenously.   The sponge and instrument counts were correct.  Adequate hemostasis  obtained.  Sternomastoid fascia closed with running 2-0 Vicryl suture.  Platysma closed with running 3-0 Vicryl suture.  Skin closed with 4-0  Monocryl.  Steri-Strips applied.   Anesthesia reversed in the operating room.  The patient awakened readily,  was at neurologic baseline moving all extremities to command.  Transferred  to the recovery room in stable condition.                                               Balinda Quails, M.D.    PGH/MEDQ  D:  03/05/2002  T:  03/05/2002  Job:  [id number]   cc:   Salvadore Farber, M.D. [facility]  [address] [place], [place] [address]  Fax: 1

## 2010-06-23 NOTE — H&P (Signed)
NAME:  Derek Hogan, SLITER NO.:  [id number]   MEDICAL RECORD NO.:  [id number]                   PATIENT TYPE:  INP   LOCATION:  NA                                   FACILITY:  [facility]   PHYSICIAN:  Balinda Quails, M.D.                 DATE OF BIRTH:  [birth year]   DATE OF ADMISSION:  03/05/2002  DATE OF DISCHARGE:                                HISTORY & PHYSICAL   CARDIOLOGIST:  Salvadore Farber, M.D.   CHIEF COMPLAINT:  This is a preoperative visit for a history and physical  exam.   HISTORY OF PRESENT ILLNESS:  The patient is a 65 year old Caucasian man  referred from Dr. Samule Ohm for evaluation of right internal carotid artery  stenosis.  Last week the patient had a physical exam prior to a cruise  vacation because he was worried about having a stroke.  A Doppler study  done that day revealed right internal carotid artery stenosis.  Dr. Samule Ohm  referred the patient to [facility] and he was evaluated by Dr. Madilyn Fireman on January  26.  After examination of the patient and review of the records, Dr. Madilyn Fireman  recommended surgical intervention for his carotid disease.  The procedure,  risks, and benefits were discussed with the patient and his wife and they  agreed to proceed.  Prior to this afternoon's visit the patient saw Dr.  Samule Ohm in his office and he was cleared from a cardiac standpoint for  surgery.  Dr. Samule Ohm also initiated beta blocker therapy.   PAST MEDICAL HISTORY:  1. Coronary artery disease, status post MI in 1996 followed by coronary     artery bypass grafting.  This occurred at [facility].  2. Dyslipidemia.  3. Diabetes mellitus type 1 since 1968.  He uses NPH and Humalog insulin.     His daily CBG range varies.  His last hemoglobin A1c was 7.3.  4. He has some peripheral edema.  This is treated with diuretic.   ALLERGIES:  The patient has no known drug allergies.   MEDICATIONS PRIOR TO ADMISSION:  1. Lipitor 80 mg p.o. daily.  2. Plavix 75 mg p.o. daily - his last dose was January 25.  3. Aciphex 20 mg p.o. daily.  4. Lisinopril 5 mg p.o. daily.  5. Demodex 40 mg p.o. daily.  6. Enteric-coated aspirin 325 mg p.o. daily.  7. Atenolol 50 mg p.o. daily - this was just started on January 27.  8. Humulog insulin 10-15 units before meals.  9. NPH insulin 25 units in the a.m., 25 units in the p.m.   SOCIAL HISTORY:  He is married.  His wife's first name is Rosey Bath.  He is a  retired Scientist, product/process development.  He quit smoking cigarettes in 1996.  He does not  use alcohol.  He and his wife live in their own home.  He does drive and his  wife will be able to assist him following his surgery.   FAMILY HISTORY:  His mother had heart disease.  Father heart disease and a  stroke.   REVIEW OF SYSTEMS:  He reports his general health is good.  He does not  suffer from headaches and no recent change in vision, no difficulty chewing  or swallowing.  He does wear eyeglasses and has dentures.  He denies any  dyspnea on exertion or cough.  Does not have any chest pain or pressure.  His weight has been stable.  No GI symptoms.  His usual diet is a diabetic  diet.  He does report some urinary frequency.  No dizziness or paresthesias,  just has a mild tremor.  Denies any claudication.  Has not had any recent  fevers or night sweats.  He does report easy bruising.  He has not had any  history of blood clots.  He does report some recent anxiety since his  diagnosis.   PHYSICAL EXAMINATION:  VITAL SIGNS:  Blood pressure on the right 118/66, on  the left 118/66.  His heart rate is 75 beats per minute.  His height is 6  feet, weight 240 pounds.  GENERAL:  The patient is a 65 year old Caucasian man, no acute distress.  HEENT:  PERRLA, EOMI.  Oral mucosa pink and moist.  His dentures are in good  repair.  NECK:  With a full range of motion.  He does have a soft right carotid  bruit; no bruit on the left.  No thyromegaly or lymphadenopathy.   LUNGS:  His breathing is unlabored.  His breath sounds are clear  bilaterally.  No wheezes or rhonchi are heard.  HEART:  Regular rate and rhythm.  He does have a well-healed median  sternotomy scar.  ABDOMEN:  Rotund, positive for bowel sounds.  No bruit.  His abdomen is soft  and nontender.  EXTREMITIES:  His lower extremities are without edema, varicosities, or  venous stasis changes.  His feet are warm.  He has 2+ pulses radial, ulnar,  and posterior tibial.  NEUROLOGIC:  He is awake, alert, and oriented.  His cranial nerves II-XII  are grossly intact.  His gait is steady.  He has full range of motion in his  upper and lower extremities bilaterally.  His muscle strength is 5/5 upper  and lower extremities bilaterally.   LABORATORY DATA:  Laboratory studies are pending.  It will be a CBC, BMET,  PT, INR, and PTT.  Chest x-ray is also pending.  Doppler study performed  January 22 at [facility] office revealed on the right moderate to  severe heterogenous plaque in the proximal internal carotid artery, trickle  flow noted by colored Doppler, severely increased velocities, 80-99% right  internal carotid artery stenosis.  On the left, minimal plaque is noted in  the bulb, velocities within normal limits.  There was a 39% left internal  carotid artery stenosis.   IMPRESSION:  This is a 65 year old man with asymptomatic right internal  carotid stenosis.   PLAN:  Right carotid endarterectomy March 05, 2002 by Dr. Denman George.  The patient will be admitted to [facility] the morning of  his surgery.     Toribio Harbour, R.N.                  Balinda Quails, M.D.    CTK/MEDQ  D:  03/03/2002  T:  03/03/2002  Job:  [id number]

## 2010-06-23 NOTE — Discharge Summary (Signed)
NAME:  Derek Hogan, Derek Hogan                       ACCOUNT NO.:  [id number]   MEDICAL RECORD NO.:  [id number]                   PATIENT TYPE:  INP   LOCATION:  3308                                 FACILITY:  [facility]   PHYSICIAN:  Balinda Quails, M.D.                 DATE OF BIRTH:  02-21-1945   DATE OF ADMISSION:  03/05/2002  DATE OF DISCHARGE:  03/06/2002                                 DISCHARGE SUMMARY   PRIMARY ADMITTING DIAGNOSIS:  Asymptomatic right carotid artery stenosis.   ADDITIONAL/DISCHARGE DIAGNOSES:  1. Asymptomatic right carotid artery stenosis.  2. History of coronary artery disease, status post previous coronary artery     bypass graft in 1996.  3. Type 2 insulin-dependent diabetes mellitus.  4. Gastroesophageal reflux disease.   PROCEDURES PERFORMED:  Right carotid endarterectomy with Dacron patch  angioplasty.   HISTORY:  The patient is a 65 year old white male who was seen by Dr. Balinda Quails for evaluation of right internal carotid artery stenosis.  He had  seen his primary care physician for a routine physical exam and was noted to  have a right carotid bruit.  Doppler studies were performed which showed a  right internal carotid artery stenosis; this was noted to be severe by  Doppler.  He has been completely asymptomatic.  He was evaluated by Dr.  Madilyn Fireman and it was felt that in light of the severity of his stenosis, he  would benefit from surgical intervention at this time.   HOSPITAL COURSE:  He was admitted on March 05, 2002 and taken to the  operating room, where he underwent a right carotid endarterectomy with  Dacron patch angioplasty.  He tolerated the procedure well and was  transferred to the recovery room in stable condition.  Postoperatively, he  was transferred to the intermediate care unit.  He has been afebrile and all  vital signs have been stable.  He has been restarted on his regular  preoperative diet and is tolerating this without  difficulty.  He has been  weaned off supplemental oxygen and is maintaining O2 saturations of greater  than 90% on room air.  He has been ambulating in the halls without  difficulty.  He has remained neurologically intact throughout his admission.  His postoperative labs are all stable.  It is felt that if he continues to  do well this morning, he will be ready for discharge home following  breakfast.   DISCHARGE MEDICATIONS:  1. Enteric-coated aspirin 325 mg daily.  2. Lipitor 80 mg daily.  3. Aciphex 20 mg daily.  4. Lisinopril 5 mg daily.  5. Demadex 40 mg daily.  6. Atenolol 50 mg daily.  7. NPH 25 units q.a.m. and q.p.m.  8. Humalog sliding scale p.r.n.  9. Tylox one to two q.4h. p.r.n. for pain.   SPECIAL DISCHARGE INSTRUCTIONS:  He is asked to discontinue Plavix.   DISCHARGE INSTRUCTIONS:  He is to refrain from driving, heavy lifting or  strenuous activity.  He may continue daily walking and use of his incentive  spirometer.  He is asked to shower daily and clean his incisions with soap  and water.   DISCHARGE FOLLOWUP:  He will see Dr. Madilyn Fireman in the office on Monday, February  16th, at 9:20 a.m.  He is to call our office if he experiences any  neurological changes, any redness, swelling or drainage in the incision site  or fever greater than 101.     Coral Ceo, P.A.                        Balinda Quails, M.D.    GC/MEDQ  D:  03/06/2002  T:  03/06/2002  Job:  [id number]   cc:   Salvadore Farber, M.D. [facility]  [address] [place], [place] [address]  Fax: 1

## 2010-07-07 ENCOUNTER — Inpatient Hospital Stay (HOSPITAL_COMMUNITY)
Admission: EM | Admit: 2010-07-07 | Discharge: 2010-07-08 | DRG: 313 | Disposition: A | Discharge status: Home / Self Care | Payer: Medicare Other | Attending: Cardiology | Admitting: Cardiology

## 2010-07-07 ENCOUNTER — Emergency Department (HOSPITAL_COMMUNITY): Payer: Medicare Other

## 2010-07-07 DIAGNOSIS — Z951 Presence of aortocoronary bypass graft: Secondary | ICD-10-CM

## 2010-07-07 DIAGNOSIS — N529 Male erectile dysfunction, unspecified: Secondary | ICD-10-CM | POA: Diagnosis present

## 2010-07-07 DIAGNOSIS — Z9641 Presence of insulin pump (external) (internal): Secondary | ICD-10-CM

## 2010-07-07 DIAGNOSIS — I959 Hypotension, unspecified: Secondary | ICD-10-CM | POA: Diagnosis present

## 2010-07-07 DIAGNOSIS — K219 Gastro-esophageal reflux disease without esophagitis: Secondary | ICD-10-CM | POA: Diagnosis present

## 2010-07-07 DIAGNOSIS — I10 Essential (primary) hypertension: Secondary | ICD-10-CM | POA: Diagnosis present

## 2010-07-07 DIAGNOSIS — E785 Hyperlipidemia, unspecified: Secondary | ICD-10-CM | POA: Diagnosis present

## 2010-07-07 DIAGNOSIS — Z79899 Other long term (current) drug therapy: Secondary | ICD-10-CM

## 2010-07-07 DIAGNOSIS — I2510 Atherosclerotic heart disease of native coronary artery without angina pectoris: Secondary | ICD-10-CM | POA: Diagnosis present

## 2010-07-07 DIAGNOSIS — R079 Chest pain, unspecified: Secondary | ICD-10-CM

## 2010-07-07 DIAGNOSIS — Z87891 Personal history of nicotine dependence: Secondary | ICD-10-CM

## 2010-07-07 DIAGNOSIS — I739 Peripheral vascular disease, unspecified: Secondary | ICD-10-CM | POA: Diagnosis present

## 2010-07-07 DIAGNOSIS — E109 Type 1 diabetes mellitus without complications: Secondary | ICD-10-CM | POA: Diagnosis present

## 2010-07-07 DIAGNOSIS — Z7982 Long term (current) use of aspirin: Secondary | ICD-10-CM

## 2010-07-07 DIAGNOSIS — Z794 Long term (current) use of insulin: Secondary | ICD-10-CM

## 2010-07-07 DIAGNOSIS — R0789 Other chest pain: Principal | ICD-10-CM | POA: Diagnosis present

## 2010-07-07 LAB — BASIC METABOLIC PANEL
BUN: 21 mg/dL (ref 6–23)
Chloride: 98 mEq/L (ref 96–112)
Creatinine, Ser: 1.16 mg/dL (ref 0.4–1.5)
GFR calc non Af Amer: >60 mL/min (ref >60)
Glucose, Bld: 437 mg/dL — ABNORMAL HIGH (ref 70–99)

## 2010-07-07 LAB — GLUCOSE, CAPILLARY: Glucose-Capillary: 157 mg/dL — ABNORMAL HIGH (ref 70–99)

## 2010-07-07 LAB — CK TOTAL AND CKMB (NOT AT ARMC)
CK, MB: 5.4 ng/mL — ABNORMAL HIGH (ref 0.3–4.0)
Total CK: 288 U/L — ABNORMAL HIGH (ref 7–232)

## 2010-07-07 LAB — CARDIAC PANEL(CRET KIN+CKTOT+MB+TROPI)
Relative Index: 2 (ref 0.0–2.5)
Troponin I: <0.3 ng/mL (ref <0.30)

## 2010-07-07 LAB — CBC
MCH: 32.1 pg (ref 26.0–34.0)
MCV: 93.4 fL (ref 78.0–100.0)
Platelets: 160 10*3/uL (ref 150–400)
RDW: 12.6 % (ref 11.5–15.5)

## 2010-07-07 LAB — DIFFERENTIAL
Basophils Relative: 0 % (ref 0–1)
Eosinophils Relative: 1 % (ref 0–5)
Lymphs Abs: 1.3 10*3/uL (ref 0.7–4.0)
Monocytes Absolute: 0.9 10*3/uL (ref 0.1–1.0)

## 2010-07-08 LAB — CBC
Hemoglobin: 12.3 g/dL — ABNORMAL LOW (ref 13.0–17.0)
MCHC: 34.1 g/dL (ref 30.0–36.0)
RBC: 3.84 MIL/uL — ABNORMAL LOW (ref 4.22–5.81)
WBC: 9.2 10*3/uL (ref 4.0–10.5)

## 2010-07-08 LAB — GLUCOSE, CAPILLARY: Glucose-Capillary: 159 mg/dL — ABNORMAL HIGH (ref 70–99)

## 2010-07-08 LAB — HEPARIN LEVEL (UNFRACTIONATED): Heparin Unfractionated: 0.26 IU/mL — ABNORMAL LOW (ref 0.30–0.70)

## 2010-07-08 LAB — COMPREHENSIVE METABOLIC PANEL
AST: 21 U/L (ref 0–37)
Albumin: 3.1 g/dL — ABNORMAL LOW (ref 3.5–5.2)
Alkaline Phosphatase: 61 U/L (ref 39–117)
BUN: 20 mg/dL (ref 6–23)
Chloride: 106 mEq/L (ref 96–112)
Potassium: 4.5 mEq/L (ref 3.5–5.1)
Total Bilirubin: 0.7 mg/dL (ref 0.3–1.2)

## 2010-07-08 LAB — CARDIAC PANEL(CRET KIN+CKTOT+MB+TROPI): Relative Index: 1.9 (ref 0.0–2.5)

## 2010-07-10 ENCOUNTER — Telehealth: Payer: Self-pay | Admitting: *Deleted

## 2010-07-10 ENCOUNTER — Telehealth: Payer: Self-pay | Admitting: Internal Medicine

## 2010-07-10 NOTE — Telephone Encounter (Signed)
Derek Hogan I don't see a myoview mentioned in last ov note, not sure

## 2010-07-10 NOTE — Telephone Encounter (Signed)
Called pt to schedule exercise myoview, pt was discharged from [facility], and per Dr. Gala Romney pt to f/u with him in 2 weeks and have myoview in 1 week. LMOM TCB.

## 2010-07-10 NOTE — Telephone Encounter (Signed)
Left mess for Derek Hogan to call back, don't see that pt needs myoview

## 2010-07-10 NOTE — Telephone Encounter (Signed)
MEGAN WANTS TO KNOW IF PT NEEDS A MYOVIEW.

## 2010-07-11 ENCOUNTER — Other Ambulatory Visit: Payer: Self-pay | Admitting: Internal Medicine

## 2010-07-11 DIAGNOSIS — R748 Abnormal levels of other serum enzymes: Secondary | ICD-10-CM

## 2010-07-11 NOTE — Telephone Encounter (Signed)
I saw in his D/C summary at [facility], thanks!

## 2010-07-11 NOTE — Telephone Encounter (Signed)
Spoke to pt, notified of myoview at [facility] scheduled for 6/11. Pt was given instructions to be NPO 4 hours prior to test, no caffeine 12 hours prior, to hold insulins/diabetic meds and Atenolol, and to wear comfortable clothing and tennis shoes. Pt verbalized understanding.

## 2010-07-14 NOTE — Discharge Summary (Signed)
NAME:  Derek Hogan, PENTECOST NO.:  [id number]  MEDICAL RECORD NO.:  [id number]           PATIENT TYPE:  I  LOCATION:  4710                         FACILITY:  [facility]  PHYSICIAN:  Luis Abed, MD, FACCDATE OF BIRTH:  Sep 02, 1945  DATE OF ADMISSION:  07/07/2010 DATE OF DISCHARGE:  07/08/2010                              DISCHARGE SUMMARY   PRIMARY CARDIOLOGIST:  [name], MD  DISCHARGE DIAGNOSIS: 1. Chest pain, noncardiac.     a.     Elevated total CPKs/normal troponins.  SECONDARY DIAGNOSES: 1. Coronary artery disease.     a.     Status post coronary artery bypass graft, 1996.     b.     Patent grafts with subtotal distal right coronary artery      stenosis, by cardiac catheterization, 2010. 2. Insulin-dependent diabetes mellitus. 3. Hypertension. 4. Dyslipidemia. 5. Peripheral arterial disease. 6. History of hypotension. 7. Remote tobacco.  REASON FOR ADMISSION:  The patient is a 66 year old male, with cardiac history as outlined above, who presented to the emergency room with symptoms deemed atypical for active ischemia.  He was admitted for overnight observation and rule out of myocardial infarction, with recommendation for an outpatient stress Myoview, if cardiac markers were negative.  Of note, he also presented with complaint of postural dizziness, and recommendation was to discontinue atenolol and decrease ACE inhibitor.  HOSPITAL COURSE:  The patient ruled out for myocardial infarction with cardiac markers notable for normal total CPKs, but with all troponins within normal limits.  He was thus cleared for discharge the following morning, with plans to proceed with further evaluation as an outpatient with an exercise stress Myoview.  Dr. Myrtis Ser also advised the patient to maintain adequate hydration, and to keep his lisinopril at low dose.  With respect to the elevated total CPKs, he recommended keeping him on the statin, but with close  followup of the elevated total CPK levels.  Serial EKGs were negative for evidence of definite ischemia.  DISCHARGE LABORATORY DATA:  Sodium 141, potassium 4.5, BUN 20, creatinine 0.8, and glucose 185.  WBC 9.2, hemoglobin 12.3, hematocrit 36, and platelet 154.  OUTSTANDING LABORATORY DATA:  Peak CPK 288/5.4 (1.9%) on admission; troponin I less than 0.30 (3).  Admission chest x-ray:  Postop changes with no acute findings.  DISPOSITION:  Stable.  FOLLOWUP: 1. Exercise stress Myoview in 1 week. 2. Dr. Arvilla Meres in 2 weeks.  DISCHARGE MEDICATIONS: 1. Lisinopril 2.5 mg daily. 2. Coated aspirin 325 mg daily. 3. Ambien 10 mg at bedtime. 4. Zetia 10 mg daily. 5. Lipitor 40 mg daily. 6. NovoLog insulin pump as directed. 7. Niacin SR 1500 mg at bedtime. 8. Omeprazole 20 mg daily.  INSTRUCTIONS:  The patient is to discontinue atenolol.  DURATION OF DISCHARGE ENCOUNTER:  Greater than 30 minutes, including physician time.     Gene Serpe, PA-C   ______________________________ Luis Abed, MD, [facility]    GS/MEDQ  D:  07/08/2010  T:  07/08/2010  Job:  [id number]  cc:   [facility]  Electronically Signed by Rozell Searing PA-C on 07/11/2010 09:57:29 AM Electronically Signed by  Elayjah Chaney MD [facility] on 07/14/2010 05:46:20 PM

## 2010-07-17 ENCOUNTER — Ambulatory Visit (HOSPITAL_COMMUNITY): Payer: Medicare Other | Attending: Internal Medicine | Admitting: Radiology

## 2010-07-17 DIAGNOSIS — I4949 Other premature depolarization: Secondary | ICD-10-CM

## 2010-07-17 DIAGNOSIS — R0602 Shortness of breath: Secondary | ICD-10-CM

## 2010-07-17 DIAGNOSIS — R079 Chest pain, unspecified: Secondary | ICD-10-CM | POA: Insufficient documentation

## 2010-07-17 DIAGNOSIS — R748 Abnormal levels of other serum enzymes: Secondary | ICD-10-CM

## 2010-07-17 DIAGNOSIS — R944 Abnormal results of kidney function studies: Secondary | ICD-10-CM | POA: Insufficient documentation

## 2010-07-17 MED ORDER — TECHNETIUM TC 99M TETROFOSMIN IV KIT
11.0000 | PACK | Freq: Once | INTRAVENOUS | Status: AC | PRN
  Administered 2010-07-17: 11 via INTRAVENOUS

## 2010-07-17 MED ORDER — TECHNETIUM TC 99M TETROFOSMIN IV KIT
33.0000 | PACK | Freq: Once | INTRAVENOUS | Status: AC | PRN
  Administered 2010-07-17: 33 via INTRAVENOUS

## 2010-07-17 NOTE — Progress Notes (Signed)
[facility] SITE 3 NUCLEAR MED [address] [place] [address] [phone]  Cardiology Nuclear Med Study  Derek Hogan is a 65 y.o. male [id number] 30-May-1945   Nuclear Med Background Indication for Stress Test:  Evaluation for Ischemia, Graft Patency and 07/08/10 Post Hospital: CP, (-) enzymes History:  '96 IWMI>CABG, '09 Echo:EF=60-65%, 10/10 OZH:YQMVHQIO ischemia, EF=55%, 11/10 Cath:patent grafts, '10 (R) CEA  Cardiac Risk Factors: Carotid Disease, Family History - CAD, History of Smoking, Hypertension, IDDM Type 2, Lipids, Obesity and PVD  Symptoms:  Chest Pain (none since discharge), Dizziness, Fatigue, Near Syncope, Palpitations, Rapid HR and SOB   Nuclear Pre-Procedure Caffeine/Decaff Intake:  None NPO After: 8:00pm   Lungs:  Clear. IV 0.9% NS with Angio Cath:  20g  IV Site: R Wrist  IV Started by:  Stanton Kidney, EMT-P  Chest Size (in):  46 Cup Size: n/a  Height: 6' (1.829 m)  Weight:  240 lb (108.863 kg)  BMI:  Body mass index is 32.55 kg/(m^2). Tech Comments:  Atenolol had been D/C'd,  Patient stopped his lisinopril on his own after discharge.  CBG=82 @ 11:30.    Nuclear Med Study 1 or 2 day study: 1 day  Stress Test Type:  Stress  Reading MD: Charlton Haws, MD  Order Authorizing Provider:  Arvilla Meres, MD  Resting Radionuclide: Technetium 48m Tetrofosmin  Resting Radionuclide Dose: 11 mCi   Stress Radionuclide:  Technetium 55m Tetrofosmin  Stress Radionuclide Dose: 33 mCi           Stress Protocol Rest HR: 69 Stress HR: 137  Rest BP: 128/58 Stress BP: 181/54  Exercise Time (min): 9:00 METS: 10.4   Predicted Max HR: 155 bpm % Max HR: 88.39 bpm Rate Pressure Product: 96295   Dose of Adenosine (mg):  n/a Dose of Lexiscan: n/a mg  Dose of Atropine (mg): n/a Dose of Dobutamine: n/a mcg/kg/min (at max HR)  Stress Test Technologist: Smiley Houseman, CMA-N  Nuclear Technologist:  Domenic Polite, CNMT     Rest Procedure:  Myocardial  perfusion imaging was performed at rest 45 minutes following the intravenous administration of Technetium 60m Tetrofosmin.  Rest ECG: Prior SWMI with rare PVC.  Stress Procedure:  The patient exercised for nine minutes on the treadmill utilizing the Bruce protocol.  The patient stopped due to fatigue and denied any chest pain.  There were no diagnostic ST-T wave changes.  He did have frequent PVC's with couplets and occasional brief runs of nonsustained v-tach.  His BP dropped at peak exercise from 181/54 to 155/50.  Technetium 59m Tetrofosmin was injected at peak exercise and myocardial perfusion imaging was performed after a brief delay.  Stress ECG: No significant change from baseline ECG  QPS Raw Data Images:  Normal; no motion artifact; normal heart/lung ratio. Stress Images:  There is decreased uptake in the inferior wall. Rest Images:  There is decreased uptake in the inferior wall. Subtraction (SDS):  There is a fixed defect that is most consistent with a previous infarction. Transient Ischemic Dilatation (Normal <1.22):  .95 Lung/Heart Ratio (Normal <0.45):  .34  Quantitative Gated Spect Images QGS EDV:  104 ml QGS ESV:  52 ml QGS cine images:  Inferior hypokinesis EF 50% QGS EF: 50%  Impression Exercise Capacity:  Good exercise capacity. BP Response:  Normal blood pressure response. Clinical Symptoms:  There is dyspnea. ECG Impression:  No significant ST segment change suggestive of ischemia. Comparison with Prior Nuclear Study: No images to compare  Overall Impression:  Small inferobasal wall infarct with no ishcemia.  Frequent ectopy and triplits in recovery. EF 50%     Charlton Haws

## 2010-07-18 ENCOUNTER — Encounter: Payer: Self-pay | Admitting: Internal Medicine

## 2010-07-18 ENCOUNTER — Other Ambulatory Visit: Payer: Self-pay | Admitting: Internal Medicine

## 2010-07-18 MED ORDER — LISINOPRIL 2.5 MG PO TABS: 2.5000 mg | ORAL_TABLET | Freq: Every day | ORAL | Status: DC

## 2010-07-18 NOTE — Progress Notes (Signed)
Copy routed to Dr. Gala Romney

## 2010-07-18 NOTE — Progress Notes (Signed)
Ok

## 2010-07-19 ENCOUNTER — Other Ambulatory Visit (INDEPENDENT_AMBULATORY_CARE_PROVIDER_SITE_OTHER): Payer: Medicare Other | Admitting: *Deleted

## 2010-07-19 DIAGNOSIS — I2510 Atherosclerotic heart disease of native coronary artery without angina pectoris: Secondary | ICD-10-CM

## 2010-07-19 DIAGNOSIS — Z79899 Other long term (current) drug therapy: Secondary | ICD-10-CM

## 2010-07-19 DIAGNOSIS — E785 Hyperlipidemia, unspecified: Secondary | ICD-10-CM

## 2010-07-20 LAB — LIPID PANEL
Cholesterol: 126 mg/dL (ref 0–200)
HDL: 47 mg/dL (ref >39)
LDL Cholesterol: 69 mg/dL (ref 0–99)
Total CHOL/HDL Ratio: 2.7 Ratio
Triglycerides: 52 mg/dL (ref <150)
VLDL: 10 mg/dL (ref 0–40)

## 2010-07-20 LAB — COMPREHENSIVE METABOLIC PANEL
ALT: 30 U/L (ref 0–53)
AST: 76 U/L — ABNORMAL HIGH (ref 0–37)
Albumin: 4 g/dL (ref 3.5–5.2)
Alkaline Phosphatase: 62 U/L (ref 39–117)
BUN: 16 mg/dL (ref 6–23)
CO2: 23 mEq/L (ref 19–32)
Calcium: 9.1 mg/dL (ref 8.4–10.5)
Chloride: 107 mEq/L (ref 96–112)
Creat: 0.86 mg/dL (ref 0.50–1.35)
Glucose, Bld: 115 mg/dL — ABNORMAL HIGH (ref 70–99)
Potassium: 4.2 mEq/L (ref 3.5–5.3)
Sodium: 140 mEq/L (ref 135–145)
Total Bilirubin: 0.9 mg/dL (ref 0.3–1.2)
Total Protein: 6.3 g/dL (ref 6.0–8.3)

## 2010-07-20 LAB — CBC WITH DIFFERENTIAL/PLATELET
Basophils Relative: 0 % (ref 0–1)
Eosinophils Absolute: 0.1 10*3/uL (ref 0.0–0.7)
HCT: 43.6 % (ref 39.0–52.0)
Hemoglobin: 14.5 g/dL (ref 13.0–17.0)
MCH: 32.7 pg (ref 26.0–34.0)
MCHC: 33.3 g/dL (ref 30.0–36.0)
Monocytes Absolute: 0.7 10*3/uL (ref 0.1–1.0)
Monocytes Relative: 9 % (ref 3–12)

## 2010-07-24 ENCOUNTER — Encounter: Payer: Self-pay | Admitting: Internal Medicine

## 2010-07-25 ENCOUNTER — Encounter: Payer: Self-pay | Admitting: Internal Medicine

## 2010-07-25 ENCOUNTER — Other Ambulatory Visit: Payer: Self-pay | Admitting: Internal Medicine

## 2010-07-25 ENCOUNTER — Ambulatory Visit (INDEPENDENT_AMBULATORY_CARE_PROVIDER_SITE_OTHER): Payer: Medicare Other | Admitting: Internal Medicine

## 2010-07-25 DIAGNOSIS — I6529 Occlusion and stenosis of unspecified carotid artery: Secondary | ICD-10-CM

## 2010-07-25 DIAGNOSIS — R55 Syncope and collapse: Secondary | ICD-10-CM

## 2010-07-25 DIAGNOSIS — D72829 Elevated white blood cell count, unspecified: Secondary | ICD-10-CM

## 2010-07-25 DIAGNOSIS — I2510 Atherosclerotic heart disease of native coronary artery without angina pectoris: Secondary | ICD-10-CM

## 2010-07-25 DIAGNOSIS — E785 Hyperlipidemia, unspecified: Secondary | ICD-10-CM

## 2010-07-25 NOTE — Progress Notes (Signed)
HPI:  Derek Hogan is a delightful 65 year old male with a history of coronary artery disease status post previous inferior wall myocardial infarction followed by bypass surgery in 1996.  Last cardiac catheterization was in Nov 2010 for Myoview with mild inferior ischemia. This showed three-vessel coronary artery disease with patent grafts. There was significant lesion in distal RCA after the graft insertion which compromised flow to distal PL. There was also some mild flow to this region antegrade via native vessel.  I reviewed with Dr. Excell Seltzer and while ths area may be a source of mild ischemia we felt that trying to angioplasty the distal native RCA might compromise the flow in the SVG-PDA via competitive flow. Thus we elected to continue medical management.  Recently admitted to [facility] for CP and presyncope. BP was low. Given IVFs. Lisinopril cut back to 2.5 daily and Atenolol stopped. Cardiac markers negative.F/u Myoview EF 50%. Inferior infarct no ischemia. Back to his usual exercise routine. No CP or dyspnea. No presyncope. Trying to drink more Gatorade. Heart was beating hard in exercise so restarted Atenolol.   ROS: All systems negative except as listed in HPI, PMH and Problem List.  Past Medical History  Diagnosis Date  . Diabetes mellitus     type 2  . Hypertension   . CAD, ARTERY BYPASS GRAFT 06/09/2008  . UNSPECIFIED PERIPHERAL VASCULAR DISEASE 10/06/2008  . HYPERTENSION, UNSPECIFIED 06/09/2008  . HYPERLIPIDEMIA-MIXED 06/09/2008  . DM 10/01/2008  . ERECTILE DYSFUNCTION 06/09/2008  . Hypotension   . Viral gastroenteritis     04/19/2010 improved  . Dyslipidemia   . GERD (gastroesophageal reflux disease)   . Weakness   . Heart attack   . PVD (peripheral vascular disease)     -- s/p right carotid endarterectomy by Dr. Liliane Bade -- carotid u/s 0-39% (9/10) -- ABI L 0.95 R 1.0 (2/07)  . Erectile dysfunction     Current Outpatient Prescriptions  Medication Sig Dispense Refill  . aspirin 325 MG  tablet Take 325 mg by mouth daily.        Marland Kitchen atenolol (TENORMIN) 25 MG tablet Take 12.5 mg by mouth daily.        Marland Kitchen atorvastatin (LIPITOR) 40 MG tablet Take 40 mg by mouth daily.        . insulin aspart (NOVOLOG) 100 UNIT/ML injection as directed.        Marland Kitchen lisinopril (PRINIVIL,ZESTRIL) 2.5 MG tablet Take 2.5 mg by mouth daily.        . niacin (NIASPAN) 750 MG CR tablet Take 750 mg by mouth at bedtime.       . NON FORMULARY as directed. Insulin pump       . omeprazole (PRILOSEC) 20 MG capsule Take 20 mg by mouth daily.        . sildenafil (VIAGRA) 100 MG tablet Take 100 mg by mouth daily as needed.        . zolpidem (AMBIEN) 5 MG tablet Take 5 mg by mouth at bedtime as needed.        Marland Kitchen DISCONTD: ezetimibe (ZETIA) 10 MG tablet Take 10 mg by mouth daily.        Marland Kitchen DISCONTD: lisinopril (PRINIVIL,ZESTRIL) 2.5 MG tablet Take 1 tablet (2.5 mg total) by mouth daily. Take 1/4 of the tablet once a day  14 tablet  0     PHYSICAL EXAM: Filed Vitals:   07/25/10 1013  BP: 124/62  Pulse: 60   General:  Well appearing. No resp difficulty HEENT: normal  Neck: supple. JVP flat. CEA scar on R Carotids 2+ bilaterally; no bruits. No lymphadenopathy or thryomegaly appreciated. Cor: PMI normal. Regular rate & rhythm. No rubs, gallops or murmurs. Lungs: clear Abdomen: soft, nontender, nondistended. No hepatosplenomegaly. No bruits or masses. Good bowel sounds. Extremities: no cyanosis, clubbing, rash, edema Neuro: alert & orientedx3, cranial nerves grossly intact. Moves all 4 extremities w/o difficulty. Affect pleasant.    ASSESSMENT & PLAN:

## 2010-07-25 NOTE — Assessment & Plan Note (Signed)
Stable. Due for f/u imagining later this year.

## 2010-07-25 NOTE — Assessment & Plan Note (Signed)
LDL at goal (69). Continue current therapy.

## 2010-07-25 NOTE — Assessment & Plan Note (Signed)
He has had intermittent leukocytosis on his admits without clear evidence of infection. Will check CBC with diff and peripheral smear.

## 2010-07-25 NOTE — Assessment & Plan Note (Signed)
Doing well. Myoview stable. Continue medical therapy.

## 2010-07-25 NOTE — Assessment & Plan Note (Signed)
Likely due to volume depletion. Stressed need to stay hydrated and liberalize salt as needed.

## 2010-07-25 NOTE — Patient Instructions (Signed)
Your physician recommends that you schedule a follow-up appointment in: 6 months Your physician recommends that you return for lab work in: 1 week (CBC)

## 2010-07-30 NOTE — H&P (Signed)
NAME:  Derek Hogan, Derek Hogan NO.:  [id number]  MEDICAL RECORD NO.:  [id number]           PATIENT TYPE:  I  LOCATION:  4710                         FACILITY:  [facility]  PHYSICIAN:  Madolyn Frieze. Jens Som, MD, FACCDATE OF BIRTH:  [birth year]  DATE OF ADMISSION:  07/07/2010 DATE OF DISCHARGE:                             HISTORY & PHYSICAL   PRIMARY CARDIOLOGIST:  [name], MD  PRIMARY CARE PROVIDER:  [facility] in [place], [place].  CHIEF COMPLAINT:  Chest pain.  HISTORY OF PRESENT ILLNESS:  This is a 65 year old Caucasian gentleman with history of coronary artery disease status post coronary artery bypass grafting in 1996 with a relook cardiac catheterization in 2010 demonstrating patent grafts and distal 99% of RCA.  It were reviewed and it was felt that PCI of the distal RCA will compromise SVG to PDA, therefore medical management was continued.  In the past, the patient has had no complaints of chest pain since that time.  He was admitted in March 2012 for hypotension felt secondary to the dehydration.  Since that time, he has continued to experience dizziness with standing.  This morning, he awoke to chest pain across the top of the anterior chest radiating down both arms.  He states that pain was a burning sensation. When he got out from bed, he was nauseous and lightheaded.  His pain was worse in the supine position.  He tried aspirin and ibuprofen and the pain had relieved within 1 hour.  His systolic blood pressure during this time was between 80s and 90s.  His EEGs were 400.  The patient describes the pain as different from prior angina.  He has remained chest pain free.  He denies any chest pain with exertion as he exercises regularly without limits.  He also states that last night he used to push a lawnmower and felt that he may have strained something.  Last evening, the patient also changed his insulin pump and he felt that he did not receive any  insulin and this is why his sugars were high this morning as they are usually controlled.  He had changed his insulin site this morning and CBGs are falling and currently 344.  The patient also had an episode of diarrhea yesterday that has resolved.  The patient was driven to the emergency room by his wife.  Upon arrival, the patient is still chest pain free.  His EKG showed normal sinus rhythm without acute changes.  Troponin is negative x1.  Cardiology was asked to admit the patient.  PAST MEDICAL HISTORY: 1. Coronary artery disease status post coronary artery bypass grafting     in 1996, LIMA to LAD, SVG to diagonal, SVG to OM-2, and SVG to PDA.     a.     Status post cardiac catheterization in 2010 demonstrating      patent grafts with a distal RCA 99% stent, PCI to this area felt      it will compromise the SVG to PDA, continue medical management. 2. Hypertension. 3. Hyperlipidemia. 4. History of hypotension. 5. Carotid artery disease status post right carotid endarterectomy in  2010. 6. Viral gastroenteritis in March 2012. 7. Type 1 diabetes mellitus, insulin pump last 4 years. 8. Gastroesophageal reflux disease. 9. Erectile dysfunction. 10.Status post cataract surgery.  SOCIAL HISTORY:  The patient lives in [place] with his wife.  He is a retired Scientist, product/process development.  He has a history of tobacco abuse but quit in 1996.  He quit using alcohol in 1982.  FAMILY HISTORY:  Noncontributory for early coronary artery disease but his mother was noted to have heart disease later in life as well as hypertension.  ALLERGIES:  No known drug allergies.  HOME MEDICATIONS: 1. Aspirin 325 mg daily. 2. Atenolol 12.5 mg daily. 3. Atorvastatin 40 mg daily. 4. Zetia 10 mg daily. 5. NovoLog sliding scale insulin. 6. Fosinopril 5 mg daily. 7. Niaspan 750 mg twice daily. 8. Prilosec 20 mg daily. 9. Viagra 100 mg daily as needed. 10.Ambien 5 mg as needed.  REVIEW OF SYSTEMS:  All  pertinent positives and negatives as stated in the HPI.  The patient denies any recent fevers or chills.  All other systems have been reviewed and negative.  PHYSICAL EXAMINATION:  VITAL SIGNS:  Temperature 97.8, pulse 87, respirations 15, blood pressure 80-106 over 36-50, O2 saturation 99% on room air. GENERAL:  This is a well-developed, well-nourished middle-aged gentleman.  He is no acute distress. HEENT:  Normal. NECK:  Supple without bruit or JVD. HEART:  Regular rate and rhythm with S1 and S2.  No murmur, rub, or gallop noted.  PMI is normal.  Pulses 2+ and equal bilaterally. LUNGS:  Clear to auscultation bilaterally without wheezes, rales, or rhonchi. ABDOMEN:  Soft, nontender, positive bowel sounds x4.  Negative hepatosplenomegaly. EXTREMITIES:  No clubbing, cyanosis, or edema. MUSCULOSKELETAL:  No joint deformities or effusions. NEUROLOGIC:  Alert and oriented x3, cranial nerves II through XII grossly intact.  Chest x-ray showing no acute cardiopulmonary disease.  EKG showing sinus rhythm at a rate of 80 beats per minute.  Axis is normal.  There is a right ventricular conduction delay with no acute ST-T-wave changes.  LABORATORY DATA:  WBC 14.7, hemoglobin 13.7, hematocrit 39.9, platelet 160.  Sodium 132, potassium 4.9, chloride 98, bicarb 23, BUN 21, creatinine 1.16.  Creatine kinase 288, CK-MB 5.4, troponin less than 0.3.  ASSESSMENT AND PLAN:  This is a 65 year old Caucasian gentleman with history of coronary artery disease status post coronary artery bypass grafting, diabetes mellitus, hypertension, hyperlipidemia, as well as gastroesophageal reflux disease, who presents with chest pain.  Chest pain is nonexertional.  It is also unlike his prior anginal symptoms. The patient has also been noted to have elevated blood sugars with associated nausea secondary to installing his pump wrong.  EKG is normal sinus rhythm without acute changes.  Troponin is negative x1.   The patient is currently chest pain free.  At this time, the patient will be admitted for atypical chest pain.  He has ruled out for myocardial infarction.  At this time, the patient will be admitted for rule out cardioversion if cardiac enzymes are negative.  If this remains negative, then we can schedule an outpatient Myoview with discharge in the morning.  The patient will be continued on his home insulin pump as this was not working properly.  Secondary to the patient's hypotension, we will decrease his lisinopril to 2.5 mg daily and discontinue his atenolol, as it was at a low dose.  He will be continued on aspirin and statin as well as started on heparin while enzymes are being  cycled. Further treatment will be dependent upon the above results.     Leonette Monarch, PA-C   ______________________________ Madolyn Frieze. Jens Som, MD, [facility]    NB/MEDQ  D:  07/07/2010  T:  07/07/2010  Job:  [id number]  cc:   [facility] [place], [place]. [name], MD  Electronically Signed by Alen Blew P.A. on 07/30/2010 04:54:50 PM Electronically Signed by Olga Millers MD [facility] on 07/30/2010 05:38:18 PM

## 2010-08-01 ENCOUNTER — Other Ambulatory Visit (INDEPENDENT_AMBULATORY_CARE_PROVIDER_SITE_OTHER): Payer: Medicare Other | Admitting: *Deleted

## 2010-08-01 DIAGNOSIS — D72829 Elevated white blood cell count, unspecified: Secondary | ICD-10-CM

## 2010-08-02 LAB — CBC WITH DIFFERENTIAL/PLATELET
Basophils Absolute: 0 10*3/uL (ref 0.0–0.1)
Basophils Relative: 0 % (ref 0–1)
Eosinophils Absolute: 0.2 10*3/uL (ref 0.0–0.7)
HCT: 45.5 % (ref 39.0–52.0)
MCH: 31.4 pg (ref 26.0–34.0)
MCHC: 31.4 g/dL (ref 30.0–36.0)
Monocytes Absolute: 0.6 10*3/uL (ref 0.1–1.0)
Neutro Abs: 4.4 10*3/uL (ref 1.7–7.7)
RDW: 13.2 % (ref 11.5–15.5)

## 2010-08-11 NOTE — Progress Notes (Signed)
Pt.notified

## 2010-08-30 ENCOUNTER — Other Ambulatory Visit (INDEPENDENT_AMBULATORY_CARE_PROVIDER_SITE_OTHER): Payer: Medicare Other | Admitting: *Deleted

## 2010-08-30 DIAGNOSIS — E785 Hyperlipidemia, unspecified: Secondary | ICD-10-CM

## 2010-08-30 DIAGNOSIS — Z79899 Other long term (current) drug therapy: Secondary | ICD-10-CM

## 2010-08-30 LAB — HEPATIC FUNCTION PANEL
ALT: 18 U/L (ref 0–53)
AST: 28 U/L (ref 0–37)
Albumin: 4 g/dL (ref 3.5–5.2)
Total Bilirubin: 0.8 mg/dL (ref 0.3–1.2)
Total Protein: 6.4 g/dL (ref 6.0–8.3)

## 2010-11-22 LAB — POCT I-STAT GLUCOSE: Operator id: 221371

## 2010-12-07 ENCOUNTER — Ambulatory Visit (INDEPENDENT_AMBULATORY_CARE_PROVIDER_SITE_OTHER): Payer: Medicare Other | Admitting: *Deleted

## 2010-12-07 ENCOUNTER — Encounter: Payer: Self-pay | Admitting: *Deleted

## 2010-12-07 DIAGNOSIS — R42 Dizziness and giddiness: Secondary | ICD-10-CM

## 2010-12-07 DIAGNOSIS — I2510 Atherosclerotic heart disease of native coronary artery without angina pectoris: Secondary | ICD-10-CM

## 2010-12-07 NOTE — Patient Instructions (Addendum)
The patient will call the office back if still feeling dizzy. Told the patient to drink penty of fluids today to see if feeling any better, if not he will call for an appointment. He will follow up with Dr. Gala Romney in Dec. 2012 as he is on for a recall at that time.

## 2011-01-02 ENCOUNTER — Ambulatory Visit (HOSPITAL_COMMUNITY)
Admission: RE | Admit: 2011-01-02 | Discharge: 2011-01-02 | Disposition: A | Discharge status: Home / Self Care | Payer: Medicare Other | Source: Ambulatory Visit | Attending: Internal Medicine | Admitting: Internal Medicine

## 2011-01-02 VITALS — BP 126/60 | HR 72 | Wt 246.8 lb

## 2011-01-02 DIAGNOSIS — I6529 Occlusion and stenosis of unspecified carotid artery: Secondary | ICD-10-CM | POA: Insufficient documentation

## 2011-01-02 DIAGNOSIS — R072 Precordial pain: Secondary | ICD-10-CM

## 2011-01-02 DIAGNOSIS — I10 Essential (primary) hypertension: Secondary | ICD-10-CM

## 2011-01-02 NOTE — Patient Instructions (Signed)
Your physician has requested that you have a carotid duplex. This test is an ultrasound of the carotid arteries in your neck. It looks at blood flow through these arteries that supply the brain with blood. Allow one hour for this exam. There are no restrictions or special instructions.  Your physician wants you to follow-up in: 4 months.  You will receive a reminder letter in the mail two months in advance. If you don't receive a letter, please call our office to schedule the follow-up appointment.

## 2011-01-02 NOTE — Progress Notes (Signed)
HPI:  Derek Hogan is a delightful 65 year old male with a history of coronary artery disease status post previous inferior wall myocardial infarction followed by bypass surgery in 1996.  Last cardiac catheterization was in Nov 2010 for Myoview with mild inferior ischemia. This showed three-vessel coronary artery disease with patent grafts. There was significant lesion in distal RCA after the graft insertion which compromised flow to distal PL. There was also some mild flow to this region antegrade via native vessel.  I reviewed with Dr. Excell Seltzer and while ths area may be a source of mild ischemia we felt that trying to angioplasty the distal native RCA might compromise the flow in the SVG-PDA via competitive flow. Thus we elected to continue medical management.  Admitted to [facility] in 6/12 for CP and presyncope. BP was low. Given IVFs. Lisinopril cut back to 2.5 daily and Atenolol stopped. Cardiac markers negative.F/u Myoview EF 50%. Inferior infarct no ischemia. Back to his usual exercise routine. No CP or dyspnea. No presyncope. Trying to drink more Gatorade. Heart was beating hard in exercise so restarted Atenolol.   Several weeks developed chest pressure at rest. Had 3 episodes over 3 days. Each lasted about a minute and resolved. Then walking up steps at [facility] game and got short of breath. No CP. However now back at gym Doing his standard workout and he feels great. Does 30 min on elliptical with HR 135 then rides the bike and walks 30 mins at 3.1 mph/3.5% grade. No further episodes. No swelling, orthopnea or PND.   Continues to get lightheaded occasionally when he stands up. Trying to drink more with exercise. Dizziness not worse on days he exercises.   Carotids 0-39% B in 11/11  ROS: All systems negative except as listed in HPI, PMH and Problem List.  Past Medical History  Diagnosis Date  . Diabetes mellitus     type 2  . Hypertension   . UNSPECIFIED PERIPHERAL VASCULAR DISEASE 10/06/2008  .  HYPERTENSION, UNSPECIFIED 06/09/2008  . HYPERLIPIDEMIA-MIXED 06/09/2008  . DM 10/01/2008  . ERECTILE DYSFUNCTION 06/09/2008  . Hypotension   . Viral gastroenteritis     04/19/2010 improved  . Dyslipidemia   . GERD (gastroesophageal reflux disease)   . Weakness   . Heart attack   . PVD (peripheral vascular disease)     -- s/p right carotid endarterectomy by Dr. Liliane Bade -- carotid u/s 0-39% (9/10) -- ABI L 0.95 R 1.0 (2/07)  . Erectile dysfunction   . CAD, ARTERY BYPASS GRAFT 06/09/2008    Current Outpatient Prescriptions  Medication Sig Dispense Refill  . aspirin 325 MG tablet Take 325 mg by mouth daily.        Marland Kitchen atenolol (TENORMIN) 25 MG tablet Take 12.5 mg by mouth daily.        Marland Kitchen atorvastatin (LIPITOR) 40 MG tablet Take 40 mg by mouth daily.        . insulin aspart (NOVOLOG) 100 UNIT/ML injection as directed.        Marland Kitchen lisinopril (PRINIVIL,ZESTRIL) 2.5 MG tablet Take 2.5 mg by mouth daily.        . niacin (NIASPAN) 750 MG CR tablet Take 750 mg by mouth at bedtime.       . NON FORMULARY as directed. Insulin pump       . omeprazole (PRILOSEC) 20 MG capsule Take 20 mg by mouth daily.        . sildenafil (VIAGRA) 100 MG tablet Take 100 mg by mouth daily as  needed.        . zolpidem (AMBIEN) 5 MG tablet Take 5 mg by mouth at bedtime as needed.           PHYSICAL EXAM: Filed Vitals:   01/02/11 1207  BP: 126/60  Pulse: 72  standing BP 122/56  General:  Well appearing. No resp difficulty HEENT: normal Neck: supple. JVP flat. CEA scar on R Carotids 2+ bilaterally; no bruits. No lymphadenopathy or thryomegaly appreciated. Cor: PMI normal. Regular rate & rhythm. No rubs, gallops or murmurs. Lungs: clear Abdomen: soft, nontender, nondistended. No hepatosplenomegaly. No bruits or masses. Good bowel sounds. Extremities: no cyanosis, clubbing, rash, edema Neuro: alert & orientedx3, cranial nerves grossly intact. Moves all 4 extremities w/o difficulty. Affect pleasant.    ASSESSMENT &  PLAN:

## 2011-01-04 ENCOUNTER — Other Ambulatory Visit: Payer: Medicare Other | Admitting: *Deleted

## 2011-01-05 DIAGNOSIS — R072 Precordial pain: Secondary | ICD-10-CM | POA: Insufficient documentation

## 2011-01-05 NOTE — Assessment & Plan Note (Signed)
BP runs low after exercise. Reinforced need for adequate hydration.

## 2011-01-05 NOTE — Assessment & Plan Note (Signed)
Asymptomatic. Due for repeat u/s.  

## 2011-01-05 NOTE — Assessment & Plan Note (Signed)
Doubt ischemic. We discussed the fact that his distal RCA is a source of potential ischemia but I do not feel current symptoms are ischemic. We discussed what to watch for.

## 2011-01-08 ENCOUNTER — Institutional Professional Consult (permissible substitution): Payer: Medicare Other | Admitting: Cardiovascular Disease

## 2011-01-19 ENCOUNTER — Other Ambulatory Visit: Payer: Self-pay | Admitting: Cardiology

## 2011-01-19 DIAGNOSIS — I6529 Occlusion and stenosis of unspecified carotid artery: Secondary | ICD-10-CM

## 2011-01-23 ENCOUNTER — Other Ambulatory Visit: Payer: Self-pay | Admitting: *Deleted

## 2011-01-23 ENCOUNTER — Encounter (INDEPENDENT_AMBULATORY_CARE_PROVIDER_SITE_OTHER): Payer: Medicare Other | Admitting: *Deleted

## 2011-01-23 ENCOUNTER — Encounter: Payer: Medicare Other | Admitting: *Deleted

## 2011-01-23 DIAGNOSIS — I6529 Occlusion and stenosis of unspecified carotid artery: Secondary | ICD-10-CM

## 2011-02-09 ENCOUNTER — Encounter: Payer: Medicare Other | Admitting: *Deleted

## 2011-05-03 ENCOUNTER — Ambulatory Visit (HOSPITAL_COMMUNITY)
Admission: RE | Admit: 2011-05-03 | Discharge: 2011-05-03 | Disposition: A | Discharge status: Home / Self Care | Payer: Medicare Other | Source: Ambulatory Visit | Attending: Internal Medicine | Admitting: Internal Medicine

## 2011-05-03 VITALS — BP 130/58 | HR 60 | Wt 254.2 lb

## 2011-05-03 DIAGNOSIS — I2510 Atherosclerotic heart disease of native coronary artery without angina pectoris: Secondary | ICD-10-CM

## 2011-05-03 DIAGNOSIS — I10 Essential (primary) hypertension: Secondary | ICD-10-CM

## 2011-05-03 DIAGNOSIS — E785 Hyperlipidemia, unspecified: Secondary | ICD-10-CM

## 2011-05-03 DIAGNOSIS — E782 Mixed hyperlipidemia: Secondary | ICD-10-CM | POA: Insufficient documentation

## 2011-05-03 NOTE — Assessment & Plan Note (Signed)
Blood pressure well controlled. Continue current regimen.  

## 2011-05-03 NOTE — Progress Notes (Signed)
HPI:  Derek Hogan is a delightful 66 year old male with a history of coronary artery disease status post previous inferior wall myocardial infarction followed by bypass surgery in 1996.  Last cardiac catheterization was in Nov 2010 for Myoview with mild inferior ischemia. This showed three-vessel coronary artery disease with patent grafts. There was significant lesion in distal RCA after the graft insertion which compromised flow to distal PL. There was also some mild flow to this region antegrade via native vessel.  I reviewed with Dr. Excell Seltzer and while ths area may be a source of mild ischemia we felt that trying to angioplasty the distal native RCA might compromise the flow in the SVG-PDA via competitive flow. Thus we elected to continue medical management.  Admitted to [facility] in 6/12 for CP and presyncope. BP was low. Given IVFs. Lisinopril cut back to 2.5 daily and Atenolol stopped. Cardiac markers negative.F/u Myoview EF 50%. Inferior infarct no ischemia. Back to his usual exercise routine. No CP or dyspnea. No presyncope. Trying to drink more Gatorade. Heart was beating hard in exercise so restarted Atenolol.   Carotids 0-39% B in 11/11  Returns for routine f/u. Working out at gym 3x/week on elliptical. Continues to work with Psychologist, educational. No CP or undue SOB with exercise. Occasional dyspnea when walking. No edema, orthopnea, PND. Dizziness resolved since we cut back anti-HTN meds. SBP 120-130 range.   We stopped Niacin recently. Recent labs HgBA1c 7.9. TC 166 HDL 47 TG 66 LDL 106 (previous TC 152/HDL 59/TG 49/LDL 64)  ROS: All systems negative except as listed in HPI, PMH and Problem List.  Past Medical History  Diagnosis Date  . Diabetes mellitus     type 2  . Hypertension   . UNSPECIFIED PERIPHERAL VASCULAR DISEASE 10/06/2008  . HYPERTENSION, UNSPECIFIED 06/09/2008  . HYPERLIPIDEMIA-MIXED 06/09/2008  . DM 10/01/2008  . ERECTILE DYSFUNCTION 06/09/2008  . Hypotension   . Viral gastroenteritis    04/19/2010 improved  . Dyslipidemia   . GERD (gastroesophageal reflux disease)   . Weakness   . Heart attack   . PVD (peripheral vascular disease)     -- s/p right carotid endarterectomy by Dr. Liliane Bade -- carotid u/s 0-39% (9/10) -- ABI L 0.95 R 1.0 (2/07)  . Erectile dysfunction   . CAD, ARTERY BYPASS GRAFT 06/09/2008    Current Outpatient Prescriptions  Medication Sig Dispense Refill  . aspirin 325 MG tablet Take 325 mg by mouth daily.        Marland Kitchen atenolol (TENORMIN) 25 MG tablet Take 12.5 mg by mouth daily.        Marland Kitchen atorvastatin (LIPITOR) 40 MG tablet Take 40 mg by mouth daily.        . insulin aspart (NOVOLOG) 100 UNIT/ML injection as directed.        Marland Kitchen lisinopril (PRINIVIL,ZESTRIL) 2.5 MG tablet Take 2.5 mg by mouth daily.        . NON FORMULARY as directed. Insulin pump       . omeprazole (PRILOSEC) 20 MG capsule Take 20 mg by mouth daily.        . sildenafil (VIAGRA) 100 MG tablet Take 100 mg by mouth daily as needed.        . zolpidem (AMBIEN) 5 MG tablet Take 5 mg by mouth at bedtime as needed.           PHYSICAL EXAM: Filed Vitals:   05/03/11 0951  BP: 130/58  Pulse: 60  standing BP 122/56  General:  Well appearing.  No resp difficulty HEENT: normal Neck: supple. JVP flat. CEA scar on R Carotids 2+ bilaterally; no bruits. No lymphadenopathy or thryomegaly appreciated. Cor: PMI normal. Regular rate & rhythm. No rubs, gallops 2/6 systolic murmurs LSB. Lungs: clear Abdomen: soft, nontender, nondistended. No hepatosplenomegaly. No bruits or masses. Good bowel sounds. Extremities: no cyanosis, clubbing, rash, edema Neuro: alert & orientedx3, cranial nerves grossly intact. Moves all 4 extremities w/o difficulty. Affect pleasant.    ASSESSMENT & PLAN:

## 2011-05-03 NOTE — Assessment & Plan Note (Signed)
LDL up after stopping Niacin. Will recheck in 2 weeks if still > 70 will increase atorva to 80 daily.

## 2011-05-03 NOTE — Assessment & Plan Note (Signed)
No evidence of ischemia. Continue current regimen.   

## 2011-05-17 ENCOUNTER — Ambulatory Visit (INDEPENDENT_AMBULATORY_CARE_PROVIDER_SITE_OTHER): Payer: Medicare Other

## 2011-05-17 DIAGNOSIS — E785 Hyperlipidemia, unspecified: Secondary | ICD-10-CM

## 2011-05-18 LAB — LIPID PANEL
Chol/HDL Ratio: 3.3 ratio units (ref 0.0–5.0)
HDL: 46 mg/dL (ref >39)
LDL Calculated: 90 mg/dL (ref 0–99)
Triglycerides: 79 mg/dL (ref 0–149)
VLDL Cholesterol Cal: 16 mg/dL (ref 5–40)

## 2011-05-18 LAB — HEPATIC FUNCTION PANEL
Albumin: 4 g/dL (ref 3.6–4.8)
Alkaline Phosphatase: 59 IU/L (ref 25–160)
Total Protein: 6.6 g/dL (ref 6.0–8.5)

## 2011-05-23 ENCOUNTER — Telehealth (HOSPITAL_COMMUNITY): Payer: Self-pay | Admitting: *Deleted

## 2011-05-23 MED ORDER — ATORVASTATIN CALCIUM 80 MG PO TABS: 80.0000 mg | ORAL_TABLET | Freq: Every day | ORAL | Status: AC

## 2011-05-23 NOTE — Telephone Encounter (Signed)
Message copied by Noralee Space on Wed May 23, 2011  4:21 PM ------      Message from: Dolores Patty      Created: Fri May 18, 2011 11:52 PM       Increase lipitor to 80 daily

## 2011-09-13 ENCOUNTER — Encounter (HOSPITAL_COMMUNITY): Payer: Self-pay

## 2011-09-13 ENCOUNTER — Ambulatory Visit (HOSPITAL_COMMUNITY)
Admission: RE | Admit: 2011-09-13 | Discharge: 2011-09-13 | Disposition: A | Discharge status: Home / Self Care | Payer: Medicare Other | Source: Ambulatory Visit | Attending: Internal Medicine | Admitting: Internal Medicine

## 2011-09-13 VITALS — BP 138/62 | HR 65 | Resp 18 | Ht 72.0 in | Wt 252.0 lb

## 2011-09-13 DIAGNOSIS — E785 Hyperlipidemia, unspecified: Secondary | ICD-10-CM

## 2011-09-13 DIAGNOSIS — I10 Essential (primary) hypertension: Secondary | ICD-10-CM

## 2011-09-13 DIAGNOSIS — E782 Mixed hyperlipidemia: Secondary | ICD-10-CM | POA: Insufficient documentation

## 2011-09-13 DIAGNOSIS — I2510 Atherosclerotic heart disease of native coronary artery without angina pectoris: Secondary | ICD-10-CM

## 2011-09-13 NOTE — Assessment & Plan Note (Signed)
Blood pressure well controlled. Continue current regimen.  

## 2011-09-13 NOTE — Assessment & Plan Note (Signed)
No evidence of ischemia. Continue current regimen.   

## 2011-09-13 NOTE — Addendum Note (Signed)
Encounter addended by: Noralee Space, RN on: 09/13/2011  3:45 PM<BR>     Documentation filed: Patient Instructions Section, Orders

## 2011-09-13 NOTE — Addendum Note (Signed)
Encounter addended by: Dolores Patty, MD on: 09/13/2011  3:39 PM<BR>     Documentation filed: Notes Section

## 2011-09-13 NOTE — Progress Notes (Addendum)
Patient ID: Derek Hogan, male   DOB: Aug 15, 1945, 65 y.o.   MRN: [id number] HPI: Derek Hogan is a delightful 66 year old male with a history of coronary artery disease status post previous inferior wall myocardial infarction followed by bypass surgery in 1996. Last cardiac catheterization was in Nov 2010 for Myoview with mild inferior ischemia. This showed three-vessel coronary artery disease with patent grafts. There was significant lesion in distal RCA after the graft insertion which compromised flow to distal PL. There was also some mild flow to this region antegrade via native vessel. I reviewed with Dr. Excell Seltzer and while ths area may be a source of mild ischemia we felt that trying to angioplasty the distal native RCA might compromise the flow in the SVG-PDA via competitive flow. Thus we elected to continue medical management.   Admitted to [facility] in 6/12 for CP and presyncope. BP was low. Given IVFs. Lisinopril cut back to 2.5 daily and Atenolol stopped. Cardiac markers negative.F/u Myoview EF 50%. Inferior infarct no ischemia. Back to his usual exercise routine. No CP or dyspnea. No presyncope. Trying to drink more Gatorade. Heart was beating hard in exercise so restarted Atenolol. Carotids 0-39% B in 11/11    Recent labs HgBA1c 7.9. TC 166 HDL 47 TG 66 LDL 106 (previous TC 152/HDL 59/TG 49/LDL 64) - atorvastatin increased to 80 08/07/11 HGB A1C 7.7 TC 150 HDL 39 TG 66 LDL 97   He returns for follow up. Denies SOB/PND/Orthopnea/CP. Occasional dizziness. Exercises 5-6 days per week.  Compliant with medications. Followed at the [place].     ROS: All systems negative except as listed in HPI, PMH and Problem List.  Past Medical History  Diagnosis Date  . Diabetes mellitus     type 2  . Hypertension   . UNSPECIFIED PERIPHERAL VASCULAR DISEASE 10/06/2008  . HYPERTENSION, UNSPECIFIED 06/09/2008  . HYPERLIPIDEMIA-MIXED 06/09/2008  . DM 10/01/2008  . ERECTILE DYSFUNCTION 06/09/2008  . Hypotension   . Viral  gastroenteritis     04/19/2010 improved  . Dyslipidemia   . GERD (gastroesophageal reflux disease)   . Weakness   . Heart attack   . PVD (peripheral vascular disease)     -- s/p right carotid endarterectomy by Dr. Liliane Bade -- carotid u/s 0-39% (9/10) -- ABI L 0.95 R 1.0 (2/07)  . Erectile dysfunction   . CAD, ARTERY BYPASS GRAFT 06/09/2008    Current Outpatient Prescriptions  Medication Sig Dispense Refill  . aspirin 325 MG tablet Take 325 mg by mouth daily.        Marland Kitchen atenolol (TENORMIN) 25 MG tablet Take 12.5 mg by mouth daily.        Marland Kitchen atorvastatin (LIPITOR) 80 MG tablet Take 1 tablet (80 mg total) by mouth daily.      . insulin aspart (NOVOLOG) 100 UNIT/ML injection as directed.        Marland Kitchen lisinopril (PRINIVIL,ZESTRIL) 2.5 MG tablet Take 2.5 mg by mouth daily.        . NON FORMULARY as directed. Insulin pump       . omeprazole (PRILOSEC) 20 MG capsule Take 20 mg by mouth daily.        . sildenafil (VIAGRA) 100 MG tablet Take 100 mg by mouth daily as needed.        . zolpidem (AMBIEN) 5 MG tablet Take 5 mg by mouth at bedtime as needed.           PHYSICAL EXAM: Filed Vitals:   09/13/11 1454  BP:  138/62  Pulse: 65  Resp: 18   Weight change:  General:  Well appearing. No resp difficulty HEENT: normal Neck: supple. JVP flat. Carotids 2+ bilaterally; no bruits. No lymphadenopathy or thryomegaly appreciated. Cor: PMI normal. Regular rate & rhythm. No rubs, gallops or murmurs. Lungs: clear Abdomen: soft, nontender, nondistended. No hepatosplenomegaly. No bruits or masses. Good bowel sounds. Extremities: no cyanosis, clubbing, rash, edema Neuro: alert & orientedx3, cranial nerves grossly intact. Moves all 4 extremities w/o difficulty. Affect pleasant.    ASSESSMENT & PLAN:

## 2011-09-13 NOTE — Assessment & Plan Note (Addendum)
Lipids look pretty good. LDL continues to trend up slightly. Continue statin. Recheck 2 months. If LDL goes up again - switch to Crestor 40.

## 2011-09-13 NOTE — Patient Instructions (Addendum)
Your physician recommends that you return for a FASTING lipid profile: 2 months  We will contact you in 3 months to schedule your next appointment.

## 2011-09-15 NOTE — Addendum Note (Signed)
Encounter addended by: Sanda Linger on: 09/15/2011 11:12 AM<BR>     Documentation filed: Charges VN

## 2011-11-13 ENCOUNTER — Ambulatory Visit (INDEPENDENT_AMBULATORY_CARE_PROVIDER_SITE_OTHER): Payer: Medicare Other

## 2011-11-13 DIAGNOSIS — E785 Hyperlipidemia, unspecified: Secondary | ICD-10-CM

## 2011-11-13 DIAGNOSIS — I2510 Atherosclerotic heart disease of native coronary artery without angina pectoris: Secondary | ICD-10-CM

## 2011-11-13 DIAGNOSIS — Z79899 Other long term (current) drug therapy: Secondary | ICD-10-CM

## 2011-11-14 LAB — LIPID PANEL
Cholesterol, Total: 137 mg/dL (ref 100–199)
HDL: 43 mg/dL (ref >39)
VLDL Cholesterol Cal: 13 mg/dL (ref 5–40)

## 2011-11-14 LAB — HEPATIC FUNCTION PANEL: ALT: 25 IU/L (ref 0–44)

## 2011-11-14 LAB — BASIC METABOLIC PANEL
CO2: 21 mmol/L (ref 19–28)
Calcium: 9.6 mg/dL (ref 8.6–10.2)
Chloride: 104 mmol/L (ref 97–108)
Potassium: 4.7 mmol/L (ref 3.5–5.2)
Sodium: 141 mmol/L (ref 134–144)

## 2011-11-19 ENCOUNTER — Encounter (HOSPITAL_COMMUNITY): Payer: Self-pay | Admitting: *Deleted

## 2011-12-07 ENCOUNTER — Ambulatory Visit: Payer: Self-pay | Admitting: Internal Medicine

## 2011-12-13 ENCOUNTER — Ambulatory Visit (HOSPITAL_COMMUNITY)
Admission: RE | Admit: 2011-12-13 | Discharge: 2011-12-13 | Disposition: A | Discharge status: Home / Self Care | Payer: Medicare Other | Source: Ambulatory Visit | Attending: Internal Medicine | Admitting: Internal Medicine

## 2011-12-13 ENCOUNTER — Encounter (HOSPITAL_COMMUNITY): Payer: Self-pay

## 2011-12-13 ENCOUNTER — Ambulatory Visit (HOSPITAL_COMMUNITY)
Admission: RE | Admit: 2011-12-13 | Discharge: 2011-12-13 | Disposition: A | Discharge status: Home / Self Care | Payer: Medicare Other | Source: Ambulatory Visit | Attending: Physician Assistant | Admitting: Physician Assistant

## 2011-12-13 VITALS — BP 144/82 | HR 66 | Ht 72.0 in | Wt 248.1 lb

## 2011-12-13 DIAGNOSIS — R05 Cough: Secondary | ICD-10-CM | POA: Insufficient documentation

## 2011-12-13 DIAGNOSIS — E119 Type 2 diabetes mellitus without complications: Secondary | ICD-10-CM | POA: Insufficient documentation

## 2011-12-13 DIAGNOSIS — E785 Hyperlipidemia, unspecified: Secondary | ICD-10-CM | POA: Insufficient documentation

## 2011-12-13 DIAGNOSIS — I2510 Atherosclerotic heart disease of native coronary artery without angina pectoris: Secondary | ICD-10-CM

## 2011-12-13 DIAGNOSIS — R0989 Other specified symptoms and signs involving the circulatory and respiratory systems: Secondary | ICD-10-CM | POA: Insufficient documentation

## 2011-12-13 DIAGNOSIS — R059 Cough, unspecified: Secondary | ICD-10-CM | POA: Insufficient documentation

## 2011-12-13 DIAGNOSIS — J988 Other specified respiratory disorders: Secondary | ICD-10-CM | POA: Insufficient documentation

## 2011-12-13 DIAGNOSIS — I739 Peripheral vascular disease, unspecified: Secondary | ICD-10-CM | POA: Insufficient documentation

## 2011-12-13 DIAGNOSIS — I252 Old myocardial infarction: Secondary | ICD-10-CM | POA: Insufficient documentation

## 2011-12-13 DIAGNOSIS — I10 Essential (primary) hypertension: Secondary | ICD-10-CM | POA: Insufficient documentation

## 2011-12-13 DIAGNOSIS — K219 Gastro-esophageal reflux disease without esophagitis: Secondary | ICD-10-CM | POA: Insufficient documentation

## 2011-12-13 DIAGNOSIS — Z951 Presence of aortocoronary bypass graft: Secondary | ICD-10-CM | POA: Insufficient documentation

## 2011-12-13 MED ORDER — MOXIFLOXACIN HCL 400 MG PO TABS: 400.0000 mg | ORAL_TABLET | Freq: Every day | ORAL | Status: DC

## 2011-12-13 NOTE — Patient Instructions (Addendum)
Chest xray today.  Start Avelox for 1 week.  Follow up 3 months.

## 2011-12-16 DIAGNOSIS — R05 Cough: Secondary | ICD-10-CM | POA: Insufficient documentation

## 2011-12-16 DIAGNOSIS — R059 Cough, unspecified: Secondary | ICD-10-CM | POA: Insufficient documentation

## 2011-12-16 NOTE — Assessment & Plan Note (Signed)
No evidence of ischemia. Continue current regimen.   

## 2011-12-16 NOTE — Assessment & Plan Note (Signed)
Patient seen and examined with Derek Blossom, PA-C. We discussed all aspects of the encounter. I agree with the assessment as stated above. My thoughts below.  He has persistent URI symptoms that have failed Augmentin Rx. Lung exam is concerning. Will start Avelox. Check CXR.

## 2011-12-16 NOTE — Progress Notes (Addendum)
HPI:  Derek Hogan is a delightful 66 year old male with a history of coronary artery disease status post previous inferior wall myocardial infarction followed by bypass surgery in 1996. Last cardiac catheterization was in Nov 2010 for Myoview with mild inferior ischemia. This showed three-vessel coronary artery disease with patent grafts. There was significant lesion in distal RCA after the graft insertion which compromised flow to distal PL. There was also some mild flow to this region antegrade via native vessel. I reviewed with Derek Hogan and while ths area may be a source of mild ischemia we felt that trying to angioplasty the distal native RCA might compromise the flow in the SVG-PDA via competitive flow. Thus we elected to continue medical management.   Admitted to [facility] in 6/12 for CP and presyncope. BP was low. Given IVFs. Lisinopril cut back to 2.5 daily and Atenolol stopped. Cardiac markers negative.F/u Myoview EF 50%. Inferior infarct no ischemia. Back to his usual exercise routine. No CP or dyspnea. No presyncope. Trying to drink more Gatorade. Heart was beating hard in exercise so restarted Atenolol. Carotids 0-39% B in 11/11    Recent labs HgBA1c 7.9. TC 166 HDL 47 TG 66 LDL 106 (previous TC 152/HDL 59/TG 49/LDL 64) - atorvastatin increased to 80 08/07/11 HGB A1C 7.7 TC 150 HDL 39 TG 66 LDL 97  He returns for follow up. He does not feel well.  He has had a cough and chest congestion for a week.  He was started on Augmentin, prednisone and flonase last Friday.  He has had minimal improvement in his symptoms.  He feels bad because he is unable to exercise due to coughing.  He denies fever/chills.  No chest pain, syncope, orthopnea/PND.  Followed at the [place].     ROS: All systems negative except as listed in HPI, PMH and Problem List.  Past Medical History  Diagnosis Date  . Diabetes mellitus     type 2  . Hypertension   . UNSPECIFIED PERIPHERAL VASCULAR DISEASE 10/06/2008  . HYPERTENSION,  UNSPECIFIED 06/09/2008  . HYPERLIPIDEMIA-MIXED 06/09/2008  . DM 10/01/2008  . ERECTILE DYSFUNCTION 06/09/2008  . Hypotension   . Viral gastroenteritis     04/19/2010 improved  . Dyslipidemia   . GERD (gastroesophageal reflux disease)   . Weakness   . Heart attack   . PVD (peripheral vascular disease)     -- s/p right carotid endarterectomy by Dr. Liliane Bade -- carotid u/s 0-39% (9/10) -- ABI L 0.95 R 1.0 (2/07)  . Erectile dysfunction   . CAD, ARTERY BYPASS GRAFT 06/09/2008    Current Outpatient Prescriptions  Medication Sig Dispense Refill  . aspirin 325 MG tablet Take 325 mg by mouth daily.        Marland Kitchen atenolol (TENORMIN) 25 MG tablet Take 12.5 mg by mouth daily.        Marland Kitchen atorvastatin (LIPITOR) 80 MG tablet Take 1 tablet (80 mg total) by mouth daily.      . insulin aspart (NOVOLOG) 100 UNIT/ML injection as directed.        Marland Kitchen lisinopril (PRINIVIL,ZESTRIL) 2.5 MG tablet Take 2.5 mg by mouth daily.        Marland Kitchen moxifloxacin (AVELOX) 400 MG tablet Take 1 tablet (400 mg total) by mouth daily.  7 tablet  0  . NON FORMULARY as directed. Insulin pump       . omeprazole (PRILOSEC) 20 MG capsule Take 20 mg by mouth daily.        . sildenafil (VIAGRA)  100 MG tablet Take 100 mg by mouth daily as needed.        . zolpidem (AMBIEN) 5 MG tablet Take 5 mg by mouth at bedtime as needed.           PHYSICAL EXAM: Filed Vitals:   12/13/11 1124  BP: 144/82  Pulse: 66  Height: 6' (1.829 m)  Weight: 248 lb 1.9 oz (112.546 kg)  SpO2: 96%    General:  Well appearing. No resp difficulty HEENT: normal Neck: supple. JVP flat. Carotids 2+ bilaterally; no bruits. No lymphadenopathy or thryomegaly appreciated. Cor: PMI normal. Regular rate & rhythm. No rubs, gallops or murmurs. Lungs: rhonchi RLL  Abdomen: soft, nontender, nondistended. No hepatosplenomegaly. No bruits or masses. Good bowel sounds. Extremities: no cyanosis, clubbing, rash, edema Neuro: alert & orientedx3, cranial nerves grossly intact. Moves all  4 extremities w/o difficulty. Affect pleasant.    ASSESSMENT & PLAN:

## 2011-12-17 ENCOUNTER — Other Ambulatory Visit: Payer: Self-pay | Admitting: Cardiology

## 2011-12-17 DIAGNOSIS — I6529 Occlusion and stenosis of unspecified carotid artery: Secondary | ICD-10-CM

## 2011-12-18 ENCOUNTER — Encounter (INDEPENDENT_AMBULATORY_CARE_PROVIDER_SITE_OTHER): Payer: Medicare Other

## 2011-12-18 DIAGNOSIS — I6529 Occlusion and stenosis of unspecified carotid artery: Secondary | ICD-10-CM

## 2012-01-03 NOTE — Addendum Note (Signed)
Encounter addended by: Dolores Patty, MD on: 01/03/2012  9:07 PM<BR>     Documentation filed: Notes Section

## 2012-02-13 IMAGING — CT CT ANGIO CHEST
2 of 8 series · 13 of 36 positions shown · IV contrast (CONTRAST)
Comparison: None

CLINICAL DATA: Back pain.  Hypotension.  Unexplained weakness.

CT ANGIOGRAPHY CHEST WITH CONTRAST
TECHNIQUE: Multidetector CT imaging of the chest was performed
using the standard protocol during bolus administration of
intravenous contrast.  Multiplanar CT image reconstructions
including MIPs were obtained to evaluate the vascular anatomy.
Contrast:  7mnipaque-GNN, 100 ml.

[Series 5: pe thins · axial · 0.79mm/px · z∈[-296,-39]mm · 12 of 305 slices shown]
[im 24/305  lung]
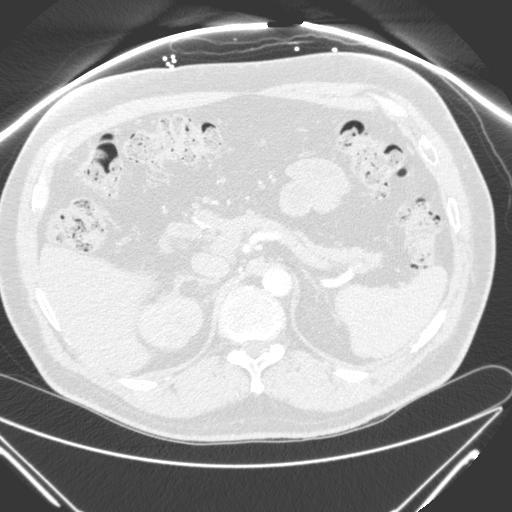
[im 47/305  mediastinal]
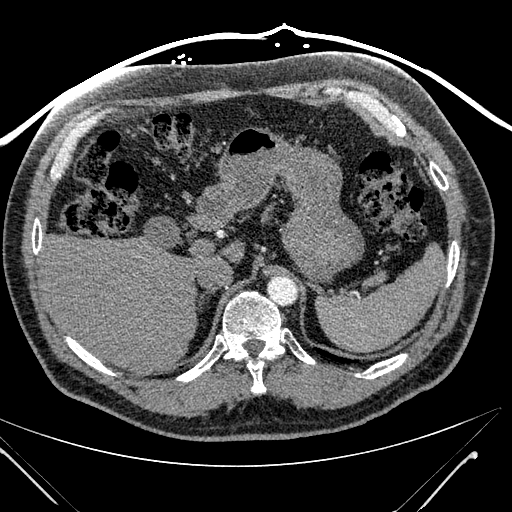
[im 71/305  lung]
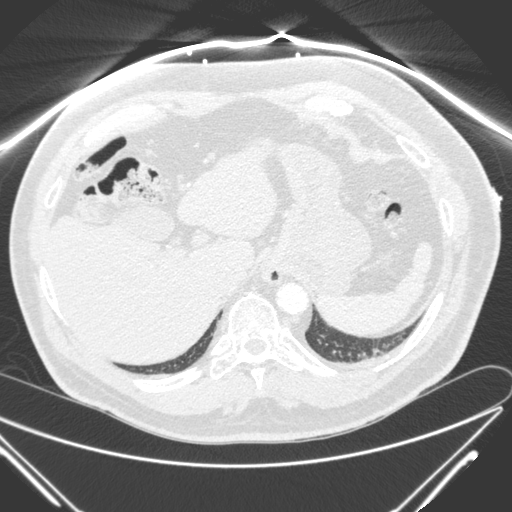
[im 94/305  mediastinal]
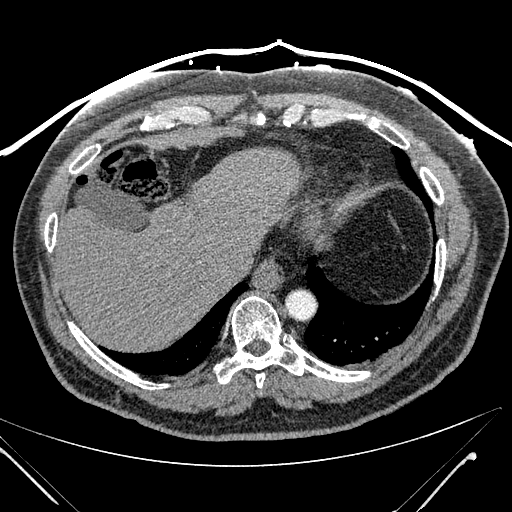
[im 117/305  lung]
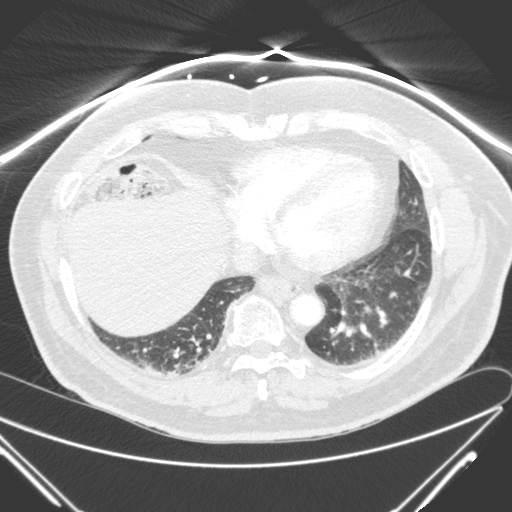
[im 141/305  mediastinal]
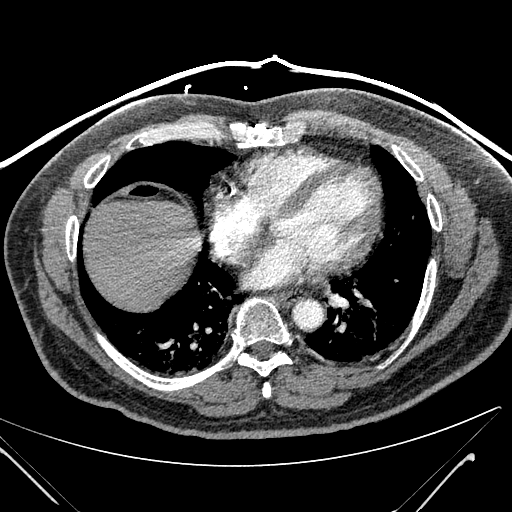
[im 164/305  lung]
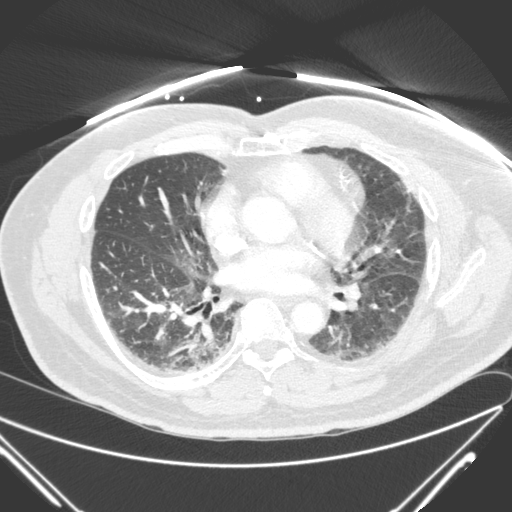
[im 188/305  mediastinal]
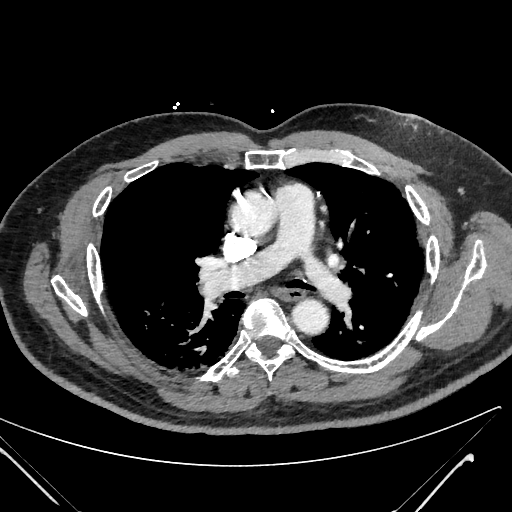
[im 211/305  lung]
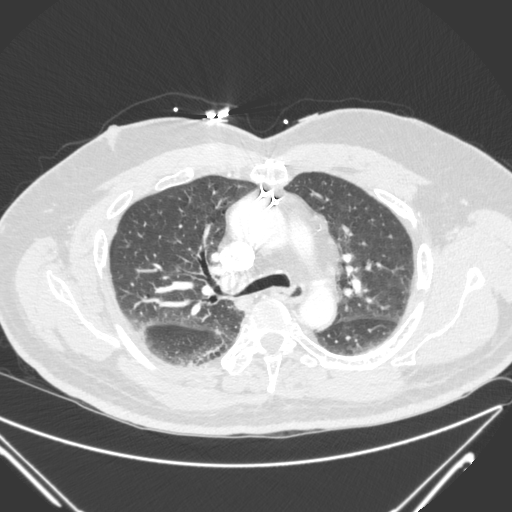
[im 234/305  mediastinal]
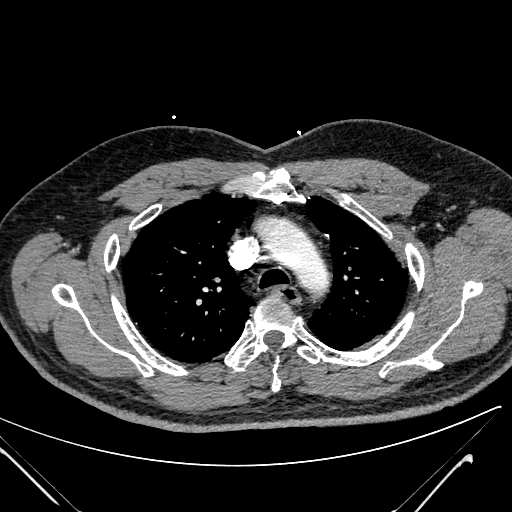
[im 258/305  lung]
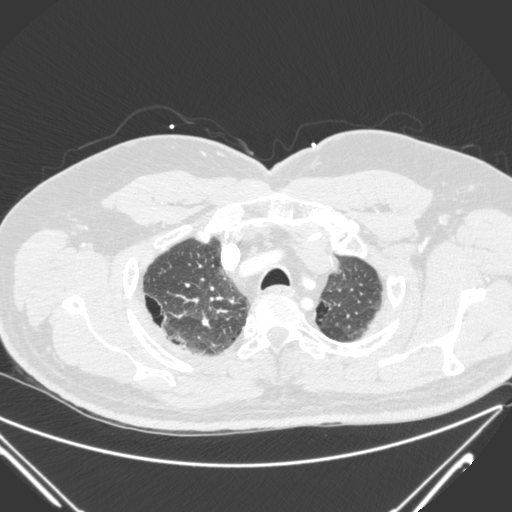
[im 281/305  mediastinal]
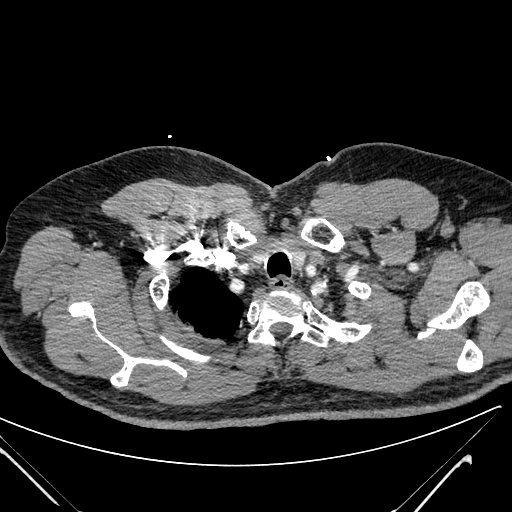

[Series 8058: mpr, coronals, coronal · coronal · 0.79mm/px · 1 of 110 slices shown]
[im 55/110  mediastinal]
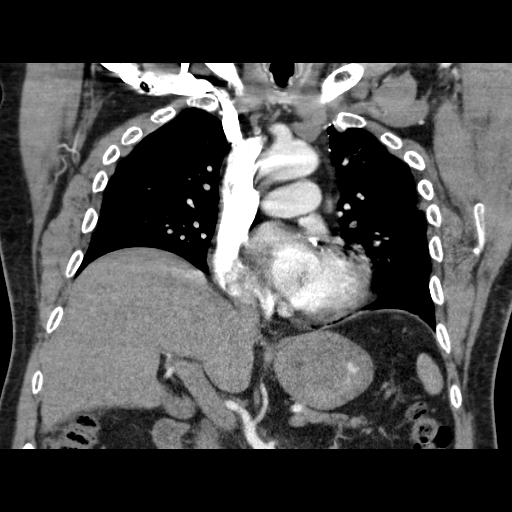

[13 of 36 positions shown; findings below may reference images not displayed]

FINDINGS: No centrally obstructing pulmonary embolus.  Second and
third order branches are mildly degraded by respiratory artifact
and less than optimal contrast bolus.  Moderate sized hiatal
hernia.  COPD with subpleural blebs bilaterally.  Moderate
bronchiectatic change both lung bases.  No frank infiltrate or
significant effusion.

Previous CABG.  No acute osseous findings.  Cardiomegaly.  Coronary
calcification.  No pericardial effusion.  Hepatomegaly.  No new
hilar or mediastinal adenopathy.  No evidence for aortic
dissection.  Mild atheromatous change in the transverse arch great
vessel origins.

Review of the MIP images confirms the above findings.
IMPRESSION: No evidence for centrally obstructing/major pulmonary embolus.  No
aortic dissection or aneurysm.

COPD with previous CABG and cardiomegaly.

Hiatal hernia.

Hepatomegaly.

## 2012-03-22 ENCOUNTER — Other Ambulatory Visit: Payer: Self-pay

## 2012-03-31 ENCOUNTER — Ambulatory Visit (HOSPITAL_COMMUNITY)
Admission: RE | Admit: 2012-03-31 | Discharge: 2012-03-31 | Disposition: A | Discharge status: Home / Self Care | Payer: Medicare Other | Source: Ambulatory Visit | Attending: Internal Medicine | Admitting: Internal Medicine

## 2012-03-31 VITALS — BP 110/58 | HR 56 | Wt 251.5 lb

## 2012-03-31 DIAGNOSIS — I2510 Atherosclerotic heart disease of native coronary artery without angina pectoris: Secondary | ICD-10-CM

## 2012-03-31 DIAGNOSIS — E785 Hyperlipidemia, unspecified: Secondary | ICD-10-CM

## 2012-03-31 DIAGNOSIS — I6529 Occlusion and stenosis of unspecified carotid artery: Secondary | ICD-10-CM

## 2012-03-31 DIAGNOSIS — R42 Dizziness and giddiness: Secondary | ICD-10-CM

## 2012-03-31 DIAGNOSIS — E782 Mixed hyperlipidemia: Secondary | ICD-10-CM | POA: Insufficient documentation

## 2012-03-31 MED ORDER — ASPIRIN 325 MG PO TABS: 162.0000 mg | ORAL_TABLET | Freq: Every day | ORAL | Status: DC

## 2012-03-31 NOTE — Assessment & Plan Note (Signed)
Suspect may be related to low BP. Will stop Atenolol. If no improvement place event monitor.

## 2012-03-31 NOTE — Assessment & Plan Note (Addendum)
No evidence of ischemia. Continue current regimen. Decrease ASA to 161 daily.

## 2012-03-31 NOTE — Addendum Note (Signed)
Encounter addended by: Noralee Space, RN on: 03/31/2012 10:35 AM<BR>     Documentation filed: Medications, Patient Instructions Section, Orders

## 2012-03-31 NOTE — Assessment & Plan Note (Signed)
Stable. Continue routine surveillance.

## 2012-03-31 NOTE — Progress Notes (Signed)
Patient ID: Derek Hogan, male   DOB: [birth year], 67 y.o.   MRN: [id number]  HPI:  Derek Hogan is a delightful 67 year old male with a history of coronary artery disease status post previous inferior wall myocardial infarction followed by bypass surgery in 1996. Last cardiac catheterization was in Nov 2010 for Myoview with mild inferior ischemia. This showed three-vessel coronary artery disease with patent grafts. There was significant lesion in distal RCA after the graft insertion which compromised flow to distal PL. There was also some mild flow to this region antegrade via native vessel. I reviewed with Dr. Excell Seltzer and while ths area may be a source of mild ischemia we felt that trying to angioplasty the distal native RCA might compromise the flow in the SVG-PDA via competitive flow. Thus we elected to continue medical management.   Admitted to [facility] in 6/12 for CP and presyncope. BP was low. Given IVFs. Lisinopril cut back to 2.5 daily and Atenolol stopped. Cardiac markers negative.F/u Myoview EF 50%. Inferior infarct no ischemia.   He returns for routine f/u. Back to his usual exercise routine. Going to gym 5 days per week. Doing elliptical and weights. Takes a while to get started but once he gets started feels fine. No CP or dyspnea. Carotids 0-39% B in 11/11.  Continues to have frequent episodes of dizziness and presyncope. Stops for a second and gets better. No palpitations. No HF. When takes BP at home systolics usually in 110 range.   Followed at the [place].   Lab Results  Component Value Date   CHOL 126 07/19/2010   HDL 43 11/13/2011   LDLCALC 81 11/13/2011   TRIG 63 11/13/2011   CHOLHDL 3.2 11/13/2011       ROS: All systems negative except as listed in HPI, PMH and Problem List.  Past Medical History  Diagnosis Date  . Diabetes mellitus     type 2  . Hypertension   . UNSPECIFIED PERIPHERAL VASCULAR DISEASE 10/06/2008  . HYPERTENSION, UNSPECIFIED 06/09/2008  . HYPERLIPIDEMIA-MIXED  06/09/2008  . DM 10/01/2008  . ERECTILE DYSFUNCTION 06/09/2008  . Hypotension   . Viral gastroenteritis     04/19/2010 improved  . Dyslipidemia   . GERD (gastroesophageal reflux disease)   . Weakness   . Heart attack   . PVD (peripheral vascular disease)     -- s/p right carotid endarterectomy by Dr. Liliane Bade -- carotid u/s 0-39% (9/10) -- ABI L 0.95 R 1.0 (2/07)  . Erectile dysfunction   . CAD, ARTERY BYPASS GRAFT 06/09/2008    Current Outpatient Prescriptions  Medication Sig Dispense Refill  . aspirin 325 MG tablet Take 325 mg by mouth daily.        Marland Kitchen atenolol (TENORMIN) 25 MG tablet Take 12.5 mg by mouth daily.        Marland Kitchen atorvastatin (LIPITOR) 80 MG tablet Take 1 tablet (80 mg total) by mouth daily.      . insulin aspart (NOVOLOG) 100 UNIT/ML injection as directed.        Marland Kitchen lisinopril (PRINIVIL,ZESTRIL) 2.5 MG tablet Take 2.5 mg by mouth daily.        . NON FORMULARY as directed. Insulin pump       . omeprazole (PRILOSEC) 20 MG capsule Take 20 mg by mouth daily.        . sildenafil (VIAGRA) 100 MG tablet Take 100 mg by mouth daily as needed.        . zolpidem (AMBIEN) 5 MG tablet Take 5  mg by mouth at bedtime as needed.         No current facility-administered medications for this encounter.     PHYSICAL EXAM: Filed Vitals:   03/31/12 0937  BP: 110/58  Pulse: 56  Weight: 251 lb 8 oz (114.08 kg)  SpO2: 96%    General:  Well appearing. No resp difficulty HEENT: normal Neck: supple. JVP flat. CEA scar. Carotids 2+ bilaterally; no bruits. No lymphadenopathy or thryomegaly appreciated. Cor: PMI normal. Regular rate & rhythm. No rubs, gallops or murmurs. Lungs: clear Abdomen: soft, nontender, nondistended. No hepatosplenomegaly. No bruits or masses. Good bowel sounds. Extremities: no cyanosis, clubbing, rash, edema Neuro: alert & orientedx3, cranial nerves grossly intact. Moves all 4 extremities w/o difficulty. Affect pleasant.    ASSESSMENT & PLAN:

## 2012-03-31 NOTE — Patient Instructions (Addendum)
Stop Atenolol  Decrease Aspirin to 162 mg daily  We will contact you in 6 months to schedule your next appointment.

## 2012-03-31 NOTE — Assessment & Plan Note (Signed)
Looks great. Continue statin. Recheck 6 months.

## 2012-06-16 ENCOUNTER — Ambulatory Visit (HOSPITAL_COMMUNITY)
Admission: RE | Admit: 2012-06-16 | Discharge: 2012-06-16 | Disposition: A | Discharge status: Home / Self Care | Payer: Medicare Other | Source: Ambulatory Visit | Attending: Internal Medicine | Admitting: Internal Medicine

## 2012-06-16 ENCOUNTER — Emergency Department (HOSPITAL_COMMUNITY)
Admission: EM | Admit: 2012-06-16 | Discharge: 2012-06-16 | Disposition: A | Discharge status: Home / Self Care | Payer: Non-veteran care | Attending: Emergency Medicine | Admitting: Emergency Medicine

## 2012-06-16 ENCOUNTER — Emergency Department (HOSPITAL_COMMUNITY): Payer: Non-veteran care

## 2012-06-16 ENCOUNTER — Encounter (HOSPITAL_COMMUNITY): Payer: Self-pay | Admitting: Family Medicine

## 2012-06-16 VITALS — HR 82 | Wt 243.8 lb

## 2012-06-16 DIAGNOSIS — E782 Mixed hyperlipidemia: Secondary | ICD-10-CM | POA: Insufficient documentation

## 2012-06-16 DIAGNOSIS — R5381 Other malaise: Secondary | ICD-10-CM | POA: Insufficient documentation

## 2012-06-16 DIAGNOSIS — R0602 Shortness of breath: Secondary | ICD-10-CM | POA: Insufficient documentation

## 2012-06-16 DIAGNOSIS — Z794 Long term (current) use of insulin: Secondary | ICD-10-CM | POA: Insufficient documentation

## 2012-06-16 DIAGNOSIS — R509 Fever, unspecified: Secondary | ICD-10-CM | POA: Insufficient documentation

## 2012-06-16 DIAGNOSIS — R079 Chest pain, unspecified: Secondary | ICD-10-CM | POA: Insufficient documentation

## 2012-06-16 DIAGNOSIS — E119 Type 2 diabetes mellitus without complications: Secondary | ICD-10-CM | POA: Insufficient documentation

## 2012-06-16 DIAGNOSIS — R5383 Other fatigue: Secondary | ICD-10-CM

## 2012-06-16 DIAGNOSIS — Z7982 Long term (current) use of aspirin: Secondary | ICD-10-CM | POA: Insufficient documentation

## 2012-06-16 DIAGNOSIS — I739 Peripheral vascular disease, unspecified: Secondary | ICD-10-CM | POA: Insufficient documentation

## 2012-06-16 DIAGNOSIS — Z87891 Personal history of nicotine dependence: Secondary | ICD-10-CM | POA: Insufficient documentation

## 2012-06-16 DIAGNOSIS — Z8719 Personal history of other diseases of the digestive system: Secondary | ICD-10-CM | POA: Insufficient documentation

## 2012-06-16 DIAGNOSIS — I25810 Atherosclerosis of coronary artery bypass graft(s) without angina pectoris: Secondary | ICD-10-CM | POA: Insufficient documentation

## 2012-06-16 DIAGNOSIS — R05 Cough: Secondary | ICD-10-CM | POA: Insufficient documentation

## 2012-06-16 DIAGNOSIS — E785 Hyperlipidemia, unspecified: Secondary | ICD-10-CM | POA: Insufficient documentation

## 2012-06-16 DIAGNOSIS — R059 Cough, unspecified: Secondary | ICD-10-CM | POA: Insufficient documentation

## 2012-06-16 DIAGNOSIS — I2510 Atherosclerotic heart disease of native coronary artery without angina pectoris: Secondary | ICD-10-CM | POA: Insufficient documentation

## 2012-06-16 DIAGNOSIS — I252 Old myocardial infarction: Secondary | ICD-10-CM | POA: Insufficient documentation

## 2012-06-16 DIAGNOSIS — F528 Other sexual dysfunction not due to a substance or known physiological condition: Secondary | ICD-10-CM | POA: Insufficient documentation

## 2012-06-16 DIAGNOSIS — N529 Male erectile dysfunction, unspecified: Secondary | ICD-10-CM | POA: Insufficient documentation

## 2012-06-16 DIAGNOSIS — R072 Precordial pain: Secondary | ICD-10-CM

## 2012-06-16 DIAGNOSIS — I10 Essential (primary) hypertension: Secondary | ICD-10-CM | POA: Insufficient documentation

## 2012-06-16 DIAGNOSIS — R110 Nausea: Secondary | ICD-10-CM | POA: Insufficient documentation

## 2012-06-16 DIAGNOSIS — K219 Gastro-esophageal reflux disease without esophagitis: Secondary | ICD-10-CM | POA: Insufficient documentation

## 2012-06-16 DIAGNOSIS — Z79899 Other long term (current) drug therapy: Secondary | ICD-10-CM | POA: Insufficient documentation

## 2012-06-16 DIAGNOSIS — Z951 Presence of aortocoronary bypass graft: Secondary | ICD-10-CM | POA: Insufficient documentation

## 2012-06-16 LAB — COMPREHENSIVE METABOLIC PANEL
ALT: 30 U/L (ref 0–53)
Calcium: 8.9 mg/dL (ref 8.4–10.5)
Creatinine, Ser: 1.08 mg/dL (ref 0.50–1.35)
GFR calc Af Amer: 80 mL/min — ABNORMAL LOW (ref >90)
Glucose, Bld: 207 mg/dL — ABNORMAL HIGH (ref 70–99)
Sodium: 134 mEq/L — ABNORMAL LOW (ref 135–145)
Total Protein: 6.7 g/dL (ref 6.0–8.3)

## 2012-06-16 LAB — URINALYSIS, ROUTINE W REFLEX MICROSCOPIC
Glucose, UA: 100 mg/dL — AB
Hgb urine dipstick: NEGATIVE
Ketones, ur: 15 mg/dL — AB
Protein, ur: NEGATIVE mg/dL
pH: 5.5 (ref 5.0–8.0)

## 2012-06-16 LAB — POCT I-STAT TROPONIN I: Troponin i, poc: 0 ng/mL (ref 0.00–0.08)

## 2012-06-16 LAB — GLUCOSE, CAPILLARY: Glucose-Capillary: 190 mg/dL — ABNORMAL HIGH (ref 70–99)

## 2012-06-16 LAB — CBC WITH DIFFERENTIAL/PLATELET
Eosinophils Absolute: 0 10*3/uL (ref 0.0–0.7)
Eosinophils Relative: 0 % (ref 0–5)
HCT: 43 % (ref 39.0–52.0)
Lymphs Abs: 1.1 10*3/uL (ref 0.7–4.0)
MCH: 31.2 pg (ref 26.0–34.0)
MCV: 90.7 fL (ref 78.0–100.0)
Monocytes Absolute: 0.8 10*3/uL (ref 0.1–1.0)
Platelets: 183 10*3/uL (ref 150–400)
RDW: 12.7 % (ref 11.5–15.5)

## 2012-06-16 LAB — PRO B NATRIURETIC PEPTIDE: Pro B Natriuretic peptide (BNP): 150.8 pg/mL — ABNORMAL HIGH (ref 0–125)

## 2012-06-16 MED ORDER — SODIUM CHLORIDE 0.9 % IV BOLUS (SEPSIS): 500.0000 mL | Freq: Once | INTRAVENOUS | Status: DC

## 2012-06-16 NOTE — Assessment & Plan Note (Signed)
I do not think this is a primary cardiac issue. I worry he has an occult infection. Will send to ER for labs, CXR and UA. I do not see evidence of a focal neuro defect.

## 2012-06-16 NOTE — Assessment & Plan Note (Signed)
Doubt cardiac in nature. ECG benign. Can check cardiac markers in ER.

## 2012-06-16 NOTE — Progress Notes (Signed)
Patient ID: Derek Hogan, male   DOB: [birth year], 67 y.o.   MRN: [id number]  HPI: Derek Hogan is a delightful 67 year old male with a history of CAD S/P previous inferior wall myocardial infarction followed by CABG 1996. Last cardiac catheterization was in Nov 2010 for Myoview with mild inferior ischemia. This showed three-vessel coronary artery disease with patent grafts. There was significant lesion in distal RCA after the graft insertion which compromised flow to distal PL. There was also some mild flow to this region antegrade via native vessel. I reviewed with Dr. Excell Seltzer and while ths area may be a source of mild ischemia we felt that trying to angioplasty the distal native RCA might compromise the flow in the SVG-PDA via competitive flow. Thus we elected to continue medical management.   Carotids 0-39% B in 12/18/2011.  Admitted to [facility] in 6/12 for CP and presyncope. BP was low. Given IVFs. Lisinopril cut back to 2.5 daily and Atenolol stopped. Cardiac markers negative.F/u Myoview EF 50%. Inferior infarct no ischemia.   He returns for an acute work in due to chest pain and fatigue. Had fevers on Saturday up to 101 and hasn't felt well since. Occasional chill. Wife says he was confused last night and couldn't remember his way to [place].   Complains of fatigue. He says he has had difficulty finding his words. No focal weakness just overall malaise. Also says he has been having chest discomfort for the last 4 days. The pain feels different from previous chest pain with MI.  Non-exertional. Played golf this morning but had to stop because he was weak. . Glucose at home 216.       Lab Results  Component Value Date   CHOL 126 07/19/2010   HDL 43 11/13/2011   LDLCALC 81 11/13/2011   TRIG 63 11/13/2011   CHOLHDL 3.2 11/13/2011       ROS: All systems negative except as listed in HPI, PMH and Problem List.  Past Medical History  Diagnosis Date  . Diabetes mellitus     type 2  . Hypertension    . UNSPECIFIED PERIPHERAL VASCULAR DISEASE 10/06/2008  . HYPERTENSION, UNSPECIFIED 06/09/2008  . HYPERLIPIDEMIA-MIXED 06/09/2008  . DM 10/01/2008  . ERECTILE DYSFUNCTION 06/09/2008  . Hypotension   . Viral gastroenteritis     04/19/2010 improved  . Dyslipidemia   . GERD (gastroesophageal reflux disease)   . Weakness   . Heart attack   . PVD (peripheral vascular disease)     -- s/p right carotid endarterectomy by Dr. Liliane Bade -- carotid u/s 0-39% (9/10) -- ABI L 0.95 R 1.0 (2/07)  . Erectile dysfunction   . CAD, ARTERY BYPASS GRAFT 06/09/2008    Current Outpatient Prescriptions  Medication Sig Dispense Refill  . aspirin 325 MG tablet Take 0.5 tablets (162 mg total) by mouth daily.      Marland Kitchen atorvastatin (LIPITOR) 80 MG tablet Take 1 tablet (80 mg total) by mouth daily.      . insulin aspart (NOVOLOG) 100 UNIT/ML injection as directed.        Marland Kitchen lisinopril (PRINIVIL,ZESTRIL) 2.5 MG tablet Take 2.5 mg by mouth daily.        . NON FORMULARY as directed. Insulin pump       . omeprazole (PRILOSEC) 20 MG capsule Take 20 mg by mouth daily.        . sildenafil (VIAGRA) 100 MG tablet Take 100 mg by mouth daily as needed.        Marland Kitchen  zolpidem (AMBIEN) 5 MG tablet Take 5 mg by mouth at bedtime as needed.         No current facility-administered medications for this encounter.     PHYSICAL EXAM: Filed Vitals:   06/16/12 1334  Pulse: 82  Weight: 243 lb 12.8 oz (110.587 kg)  SpO2: 97%    General:  Fatigued appearing. Sluggish. No resp difficulty. + nasal congestion.  HEENT: normal Neck: supple. JVP flat. CEA scar. Carotids 2+ bilaterally; no bruits. No lymphadenopathy or thryomegaly appreciated. Cor: PMI normal. Regular rate & rhythm. No rubs, gallops or murmurs. Lungs: clear Abdomen: soft, nontender, nondistended. No hepatosplenomegaly. No bruits or masses. Good bowel sounds. Extremities: no cyanosis, clubbing, rash, edema Neuro: alert & orientedx3, cranial nerves grossly intact. Moves all 4  extremities w/o difficulty. Affect pleasant. FTN ok. No pronator drift  ECG: SR. No acute ST-T wave changes  ASSESSMENT & PLAN:

## 2012-06-16 NOTE — ED Notes (Signed)
Pt sent here from the vascular center for chest pain, SOB, low grade temp and some confusion since yesterday. EKG there unremarkable.

## 2012-06-16 NOTE — ED Notes (Signed)
Patient transported to X-ray 

## 2012-06-16 NOTE — ED Provider Notes (Signed)
History     CSN: [id number]  Arrival date & time 06/16/12  1404   First MD Initiated Contact with Patient 06/16/12 1503      Chief Complaint  Patient presents with  . Chest Pain    (Consider location/radiation/quality/duration/timing/severity/associated sxs/prior treatment) HPI Comments: Patient with a history of CHF and CAD presents today from the office of Dr. Milas Kocher today due to chest pain.  He reports that he had an episode of chest pain 3 days ago while on the Elliptical.  He reports that the chest pain lasted a couple of seconds and then resolved.  He describes the pain as a "squeezing" pain.  He reports that this morning he again developed chest pain while golfing.  He had three different episodes that each lasted a few seconds.  He denies any chest pain at this time.  Patient has a history of CAD s/p inferior wall MI with CABG in 1996.  Patient had a Cardiac Cath in 2010, which showed 3 vessel coronary artery disease with patent grafts.  He was also found to have an EF of 50% in 2012.  Patient reports that he had a fever of 101 two days ago, but has not had a fever since that time.  He also reports that yesterday he had a dry cough, which has continued today.   He denies SOB, nausea, vomiting, numbness, tingling, dizziness, lightheadedness, or syncope.    Patient is a 67 y.o. male presenting with chest pain. The history is provided by the patient.  Chest Pain Associated symptoms: cough, fever, nausea and shortness of breath   Associated symptoms: no abdominal pain, no dizziness, no headache and not vomiting     Past Medical History  Diagnosis Date  . Diabetes mellitus     type 2  . Hypertension   . UNSPECIFIED PERIPHERAL VASCULAR DISEASE 10/06/2008  . HYPERTENSION, UNSPECIFIED 06/09/2008  . HYPERLIPIDEMIA-MIXED 06/09/2008  . DM 10/01/2008  . ERECTILE DYSFUNCTION 06/09/2008  . Hypotension   . Viral gastroenteritis     04/19/2010 improved  . Dyslipidemia   . GERD (gastroesophageal  reflux disease)   . Weakness   . Heart attack   . PVD (peripheral vascular disease)     -- s/p right carotid endarterectomy by Dr. Liliane Bade -- carotid u/s 0-39% (9/10) -- ABI L 0.95 R 1.0 (2/07)  . Erectile dysfunction   . CAD, ARTERY BYPASS GRAFT 06/09/2008    Past Surgical History  Procedure Laterality Date  . Refractive surgery      Status post cataract surgery.  . Carotid endarterectomy      Status post carotid endarterectomy of the right carotid.   . Coronary artery bypass graft  1996    --s/p previous inferior wall MI followed by bypass surgery in 1996.   --last cardiac cath Nov 2010 2008, which showed three-vessel CAD with patent grafts, EF was 50%. likely small area of ischemia in distal RCA and PL - medical management --echo 11/09 EF 60-65%     Family History  Problem Relation Age of Onset  . Coronary artery disease Mother   . Stroke    . Coronary artery disease Father   . Diabetes Mother   . Diabetes Father     History  Substance Use Topics  . Smoking status: Former Smoker    Quit date: 02/05/1994  . Smokeless tobacco: Never Used  . Alcohol Use: No     Comment: pt has a remote history of alcoholism / he quit  on Halloween in 1982      Review of Systems  Constitutional: Positive for fever, chills and appetite change.  Respiratory: Positive for cough and shortness of breath.   Cardiovascular: Positive for chest pain. Negative for leg swelling.  Gastrointestinal: Positive for nausea. Negative for vomiting and abdominal pain.  Genitourinary: Negative for dysuria, urgency and frequency.  Neurological: Negative for dizziness, syncope, light-headedness and headaches.  All other systems reviewed and are negative.    Allergies  Review of patient's allergies indicates no known allergies.  Home Medications   Current Outpatient Rx  Name  Route  Sig  Dispense  Refill  . aspirin EC 81 MG tablet   Oral   Take 162 mg by mouth at bedtime.         Marland Kitchen atorvastatin  (LIPITOR) 80 MG tablet   Oral   Take 1 tablet (80 mg total) by mouth daily.         . insulin aspart (NOVOLOG) 100 unit/mL injection   Subcutaneous   Inject into the skin continuous. Insulin Pump         . lisinopril (PRINIVIL,ZESTRIL) 2.5 MG tablet   Oral   Take 2.5 mg by mouth at bedtime.          Marland Kitchen omeprazole (PRILOSEC) 20 MG capsule   Oral   Take 20 mg by mouth daily.           Marland Kitchen zolpidem (AMBIEN) 10 MG tablet   Oral   Take 10 mg by mouth at bedtime as needed for sleep.         . sildenafil (VIAGRA) 100 MG tablet   Oral   Take 100 mg by mouth daily as needed for erectile dysfunction.            BP 128/57  Temp(Src) 98.7 F (37.1 C) (Oral)  Resp 16  SpO2 99%  Physical Exam  Nursing note and vitals reviewed. Constitutional: He is oriented to person, place, and time. He appears well-developed and well-nourished.  HENT:  Head: Normocephalic and atraumatic.  Mouth/Throat: Oropharynx is clear and moist.  Eyes: EOM are normal. Pupils are equal, round, and reactive to light.  Neck: Normal range of motion. Neck supple.  Cardiovascular: Normal rate, regular rhythm and normal heart sounds.   Pulmonary/Chest: Effort normal and breath sounds normal. No respiratory distress. He has no wheezes. He has no rales.  Abdominal: Soft. There is no tenderness.  Musculoskeletal:  No LE edema  Neurological: He is alert and oriented to person, place, and time. He has normal strength. No cranial nerve deficit or sensory deficit. Gait normal.  Skin: Skin is warm and dry.  Psychiatric: He has a normal mood and affect.    ED Course  Procedures (including critical care time)  Labs Reviewed  CBC WITH DIFFERENTIAL  COMPREHENSIVE METABOLIC PANEL  PRO B NATRIURETIC PEPTIDE  POCT I-STAT TROPONIN I   Dg Chest 2 View  06/16/2012  *RADIOLOGY REPORT*  Clinical Data: Mid sternal chest pain  CHEST - 2 VIEW  Comparison: 12/13/2011  Findings: The lungs are clear without focal  infiltrate, edema, pneumothorax or pleural effusion. Interstitial markings are diffusely coarsened with chronic features. The cardiopericardial silhouette is within normal limits for size.  The patient is status post CABG.  Mild compression deformity of the upper thoracic vertebral body is stable.  IMPRESSION: Underlying chronic interstitial coarsening.  No acute cardiopulmonary process.   Original Report Authenticated By: Kennith Center, M.D.      No  diagnosis found.   Date: 06/17/2012  Rate: 79  Rhythm: normal sinus rhythm  QRS Axis: normal  Intervals: normal  ST/T Wave abnormalities: nonspecific T wave changes  Conduction Disutrbances:none  Narrative Interpretation:   Old EKG Reviewed: unchanged    3:55 PM Discussed with Trish with [facility].  She reports that McGregor will be down to see the patient.  4:24 PM Discussed with Dr. Milas Kocher.  He recommends giving the patient 500 cc of NS and following up with UA.  He feels that if the UA is negative the patient can go home.  He does not feel that the chest pain is cardiac in nature.  MDM  Patient presents with a chief complaint of chest pain.  He came from the office of Dr. Milas Kocher to obtain cardiac markers and labs.  Patient did not have any ischemic changes on EKG.  Troponin negative.  Labs unremarkable.  CXR negative.  Discussed lab results with Dr. Milas Kocher and he felt that the patient was stable for discharge.  Dr. Milas Kocher did not feel that the chest pain was cardiac in nature.  Feel that patient is stable for discharge.  Return precautions discussed.        Pascal Lux Meno, PA-C 06/17/12 1555

## 2012-06-17 ENCOUNTER — Telehealth (HOSPITAL_COMMUNITY): Payer: Self-pay | Admitting: *Deleted

## 2012-06-17 MED ORDER — MOXIFLOXACIN HCL 400 MG PO TABS: 400.0000 mg | ORAL_TABLET | Freq: Every day | ORAL | Status: AC

## 2012-06-17 NOTE — Telephone Encounter (Signed)
Per Dr Gala Romney pt need Avelox for 7 days, rx sent in pt aware

## 2012-06-18 NOTE — Addendum Note (Signed)
Encounter addended by: Kemo Spruce C Prima Rayner, CCT on: 06/18/2012  9:18 AM<BR>     Documentation filed: Charges VN

## 2012-06-20 NOTE — ED Provider Notes (Signed)
Medical screening examination/treatment/procedure(s) were performed by non-physician practitioner and as supervising physician I was immediately available for consultation/collaboration.  Toy Baker, MD 06/20/12 [phone]

## 2012-08-11 ENCOUNTER — Telehealth (HOSPITAL_COMMUNITY): Payer: Self-pay | Admitting: *Deleted

## 2012-08-11 DIAGNOSIS — E782 Mixed hyperlipidemia: Secondary | ICD-10-CM

## 2012-08-11 NOTE — Telephone Encounter (Signed)
Yearly f/u appt scheduled for 8/11, pt will also needs yearly labs order placed and pt will go to [facility] on 8/5

## 2012-09-09 ENCOUNTER — Ambulatory Visit (INDEPENDENT_AMBULATORY_CARE_PROVIDER_SITE_OTHER): Payer: Medicare Other

## 2012-09-09 DIAGNOSIS — E782 Mixed hyperlipidemia: Secondary | ICD-10-CM

## 2012-09-10 ENCOUNTER — Other Ambulatory Visit: Payer: Self-pay

## 2012-09-10 LAB — BASIC METABOLIC PANEL
Calcium: 9.8 mg/dL (ref 8.6–10.2)
Creatinine, Ser: 0.91 mg/dL (ref 0.76–1.27)
GFR calc non Af Amer: 86 mL/min/{1.73_m2} (ref >59)
Glucose: 132 mg/dL — ABNORMAL HIGH (ref 65–99)

## 2012-09-15 ENCOUNTER — Ambulatory Visit (HOSPITAL_COMMUNITY)
Admission: RE | Admit: 2012-09-15 | Discharge: 2012-09-15 | Disposition: A | Discharge status: Home / Self Care | Payer: Medicare Other | Source: Ambulatory Visit | Attending: Internal Medicine | Admitting: Internal Medicine

## 2012-09-15 ENCOUNTER — Encounter (HOSPITAL_COMMUNITY): Payer: Self-pay

## 2012-09-15 VITALS — BP 102/56 | HR 77 | Wt 243.4 lb

## 2012-09-15 DIAGNOSIS — I252 Old myocardial infarction: Secondary | ICD-10-CM | POA: Insufficient documentation

## 2012-09-15 DIAGNOSIS — R9431 Abnormal electrocardiogram [ECG] [EKG]: Secondary | ICD-10-CM | POA: Insufficient documentation

## 2012-09-15 DIAGNOSIS — R55 Syncope and collapse: Secondary | ICD-10-CM | POA: Insufficient documentation

## 2012-09-15 DIAGNOSIS — E119 Type 2 diabetes mellitus without complications: Secondary | ICD-10-CM | POA: Insufficient documentation

## 2012-09-15 DIAGNOSIS — I2510 Atherosclerotic heart disease of native coronary artery without angina pectoris: Secondary | ICD-10-CM | POA: Insufficient documentation

## 2012-09-15 DIAGNOSIS — I10 Essential (primary) hypertension: Secondary | ICD-10-CM | POA: Insufficient documentation

## 2012-09-15 DIAGNOSIS — E782 Mixed hyperlipidemia: Secondary | ICD-10-CM | POA: Insufficient documentation

## 2012-09-15 DIAGNOSIS — E785 Hyperlipidemia, unspecified: Secondary | ICD-10-CM

## 2012-09-15 LAB — COMPREHENSIVE METABOLIC PANEL
ALT: 21 U/L (ref 0–53)
AST: 25 U/L (ref 0–37)
CO2: 28 mEq/L (ref 19–32)
Calcium: 9.5 mg/dL (ref 8.4–10.5)
Chloride: 102 mEq/L (ref 96–112)
GFR calc non Af Amer: 89 mL/min — ABNORMAL LOW (ref >90)
Sodium: 137 mEq/L (ref 135–145)
Total Bilirubin: 0.6 mg/dL (ref 0.3–1.2)

## 2012-09-15 NOTE — Patient Instructions (Addendum)
Follow up in 6 months.  Get lipid profile when fasting next week.

## 2012-09-15 NOTE — Progress Notes (Addendum)
Patient ID: Derek Hogan, male   DOB: 07-Feb-1945, 67 y.o.   MRN: [id number]  HPI: Derek Hogan is a delightful 67 year old male with a history of CAD S/P previous inferior wall myocardial infarction followed by CABG 1996. Last cardiac catheterization was in Nov 2010 for Myoview with mild inferior ischemia. This showed three-vessel coronary artery disease with patent grafts. There was significant lesion in distal RCA after the graft insertion which compromised flow to distal PL. There was also some mild flow to this region antegrade via native vessel. I reviewed with Dr. Excell Seltzer and while ths area may be a source of mild ischemia we felt that trying to angioplasty the distal native RCA might compromise the flow in the SVG-PDA via competitive flow. Thus we elected to continue medical management.   Carotids 0-39% B in 12/18/2011.  Admitted to [facility] in 6/12 for CP and presyncope. BP was low. Given IVFs. Lisinopril cut back to 2.5 daily and Atenolol stopped. Cardiac markers negative.F/u Myoview EF 50%. Inferior infarct no ischemia.   Follow up: Since last visit has been seen in the ED 5/14 for CP. CEs negative. Denies CP or SOB. Complaints of light headedness few times a day. K+ elevated 5.5 on 09/09/12. Weight stable.   ROS: All systems negative except as listed in HPI, PMH and Problem List.  Past Medical History  Diagnosis Date  . Diabetes mellitus     type 2  . Hypertension   . UNSPECIFIED PERIPHERAL VASCULAR DISEASE 10/06/2008  . HYPERTENSION, UNSPECIFIED 06/09/2008  . HYPERLIPIDEMIA-MIXED 06/09/2008  . DM 10/01/2008  . ERECTILE DYSFUNCTION 06/09/2008  . Hypotension   . Viral gastroenteritis     04/19/2010 improved  . Dyslipidemia   . GERD (gastroesophageal reflux disease)   . Weakness   . Heart attack   . PVD (peripheral vascular disease)     -- s/p right carotid endarterectomy by Dr. Liliane Bade -- carotid u/s 0-39% (9/10) -- ABI L 0.95 R 1.0 (2/07)  . Erectile dysfunction   . CAD, ARTERY BYPASS  GRAFT 06/09/2008    Current Outpatient Prescriptions  Medication Sig Dispense Refill  . aspirin EC 81 MG tablet Take 162 mg by mouth at bedtime.      Marland Kitchen atorvastatin (LIPITOR) 80 MG tablet Take 1 tablet (80 mg total) by mouth daily.      . insulin aspart (NOVOLOG) 100 unit/mL injection Inject into the skin continuous. Insulin Pump      . lisinopril (PRINIVIL,ZESTRIL) 2.5 MG tablet Take 2.5 mg by mouth at bedtime.       Marland Kitchen omeprazole (PRILOSEC) 20 MG capsule Take 20 mg by mouth daily.        . sildenafil (VIAGRA) 100 MG tablet Take 100 mg by mouth daily as needed for erectile dysfunction.       Marland Kitchen zolpidem (AMBIEN) 10 MG tablet Take 10 mg by mouth at bedtime as needed for sleep.       No current facility-administered medications for this encounter.    PHYSICAL EXAM: Filed Vitals:   09/15/12 0847  BP: 102/56  Pulse: 77  Weight: 243 lb 6.4 oz (110.406 kg)  SpO2: 95%    General: Well appearing. No resp difficulty.  HEENT: normal Neck: supple. JVP flat. CEA scar. Carotids 2+ bilaterally; no bruits. No lymphadenopathy or thryomegaly appreciated. Cor: PMI normal. Regular rate & rhythm. No rubs, gallops or murmurs. Lungs: clear Abdomen: soft, nontender, nondistended. No hepatosplenomegaly. No bruits or masses. Good bowel sounds. Extremities: no cyanosis, clubbing, rash,  edema Neuro: alert & orientedx3, cranial nerves grossly intact. Moves all 4 extremities w/o difficulty. Affect pleasant. FTN ok. No pronator drift  ECG: SR 71 bpm no acute ST changes  ASSESSMENT & PLAN:  1) CAD, s/p CABG 1996 - No s/s ischemia - Will continue ASA and statin. - needs Lipid profile next week after fasting  2) HTN - Stable - K+ elevated on ACE-I will recheck today and may need to d/c or consider another agent.  3) HLD - checking hepatic function today, - not fasting will get lipid profile next week     Ulla Potash B NP-C 5:43 PM   Addendum: K+ still elevated. Will recheck in 1 month along  with getting lipid profile.

## 2012-09-16 NOTE — Addendum Note (Signed)
Encounter addended by: Merlene Pulling, CCT on: 09/16/2012  8:27 AM<BR>     Documentation filed: Charges VN

## 2012-09-18 ENCOUNTER — Other Ambulatory Visit: Payer: Medicare Other

## 2012-09-19 ENCOUNTER — Telehealth (HOSPITAL_COMMUNITY): Payer: Self-pay | Admitting: *Deleted

## 2012-09-19 DIAGNOSIS — E875 Hyperkalemia: Secondary | ICD-10-CM

## 2012-09-19 NOTE — Telephone Encounter (Signed)
Message copied by Noralee Space on Fri Sep 19, 2012  3:39 PM ------      Message from: Dolores Patty      Created: Fri Sep 19, 2012  3:18 PM       Ok. K+ mildly elevated. i talked to him and he is not taking any K+ supplements. Will repeat in 1 month when he has lipids checked. ------

## 2012-10-09 ENCOUNTER — Ambulatory Visit (INDEPENDENT_AMBULATORY_CARE_PROVIDER_SITE_OTHER): Payer: Medicare Other

## 2012-10-09 ENCOUNTER — Other Ambulatory Visit: Payer: Medicare Other

## 2012-10-09 DIAGNOSIS — E875 Hyperkalemia: Secondary | ICD-10-CM

## 2012-10-09 DIAGNOSIS — I2510 Atherosclerotic heart disease of native coronary artery without angina pectoris: Secondary | ICD-10-CM

## 2012-10-09 DIAGNOSIS — E119 Type 2 diabetes mellitus without complications: Secondary | ICD-10-CM

## 2012-10-09 DIAGNOSIS — E782 Mixed hyperlipidemia: Secondary | ICD-10-CM

## 2012-10-10 LAB — BASIC METABOLIC PANEL
BUN/Creatinine Ratio: 10 (ref 10–22)
BUN: 9 mg/dL (ref 8–27)
CO2: 21 mmol/L (ref 18–29)
Chloride: 105 mmol/L (ref 97–108)
Sodium: 143 mmol/L (ref 134–144)

## 2012-10-10 LAB — HEPATIC FUNCTION PANEL
ALT: 26 IU/L (ref 0–44)
AST: 32 IU/L (ref 0–40)

## 2012-10-10 LAB — LIPID PANEL
Cholesterol, Total: 134 mg/dL (ref 100–199)
HDL: 49 mg/dL (ref >39)
Triglycerides: 51 mg/dL (ref 0–149)

## 2012-12-11 ENCOUNTER — Other Ambulatory Visit: Payer: Self-pay

## 2013-01-06 ENCOUNTER — Ambulatory Visit (HOSPITAL_COMMUNITY)
Admission: RE | Admit: 2013-01-06 | Discharge: 2013-01-06 | Disposition: A | Discharge status: Home / Self Care | Payer: Medicare Other | Source: Ambulatory Visit | Attending: Internal Medicine | Admitting: Internal Medicine

## 2013-01-06 VITALS — BP 128/60 | HR 88 | Wt 243.5 lb

## 2013-01-06 DIAGNOSIS — Z951 Presence of aortocoronary bypass graft: Secondary | ICD-10-CM | POA: Insufficient documentation

## 2013-01-06 DIAGNOSIS — R42 Dizziness and giddiness: Secondary | ICD-10-CM | POA: Insufficient documentation

## 2013-01-06 DIAGNOSIS — I6529 Occlusion and stenosis of unspecified carotid artery: Secondary | ICD-10-CM

## 2013-01-06 DIAGNOSIS — R0989 Other specified symptoms and signs involving the circulatory and respiratory systems: Secondary | ICD-10-CM | POA: Insufficient documentation

## 2013-01-06 DIAGNOSIS — E785 Hyperlipidemia, unspecified: Secondary | ICD-10-CM | POA: Insufficient documentation

## 2013-01-06 DIAGNOSIS — R5381 Other malaise: Secondary | ICD-10-CM | POA: Insufficient documentation

## 2013-01-06 DIAGNOSIS — Z7982 Long term (current) use of aspirin: Secondary | ICD-10-CM | POA: Insufficient documentation

## 2013-01-06 DIAGNOSIS — R0609 Other forms of dyspnea: Secondary | ICD-10-CM | POA: Insufficient documentation

## 2013-01-06 DIAGNOSIS — R0602 Shortness of breath: Secondary | ICD-10-CM | POA: Insufficient documentation

## 2013-01-06 DIAGNOSIS — I2510 Atherosclerotic heart disease of native coronary artery without angina pectoris: Secondary | ICD-10-CM

## 2013-01-06 DIAGNOSIS — K219 Gastro-esophageal reflux disease without esophagitis: Secondary | ICD-10-CM | POA: Insufficient documentation

## 2013-01-06 DIAGNOSIS — Z794 Long term (current) use of insulin: Secondary | ICD-10-CM | POA: Insufficient documentation

## 2013-01-06 DIAGNOSIS — E119 Type 2 diabetes mellitus without complications: Secondary | ICD-10-CM | POA: Insufficient documentation

## 2013-01-06 DIAGNOSIS — I10 Essential (primary) hypertension: Secondary | ICD-10-CM | POA: Insufficient documentation

## 2013-01-06 NOTE — Patient Instructions (Signed)
Your physician has requested that you have an echocardiogram. Echocardiography is a painless test that uses sound waves to create images of your heart. It provides your doctor with information about the size and shape of your heart and how well your heart's chambers and valves are working. This procedure takes approximately one hour. There are no restrictions for this procedure.  Your physician has recommended that you have a cardiopulmonary stress test (CPX). CPX testing is a non-invasive measurement of heart and lung function. It replaces a traditional treadmill stress test. This type of test provides a tremendous amount of information that relates not only to your present condition but also for future outcomes. This test combines measurements of you ventilation, respiratory gas exchange in the lungs, electrocardiogram (EKG), blood pressure and physical response before, during, and following an exercise protocol.  We will contact you in 4 months to schedule your next appointment.

## 2013-01-06 NOTE — Addendum Note (Signed)
Encounter addended by: Noralee Space, RN on: 01/06/2013 10:39 AM<BR>     Documentation filed: Patient Instructions Section, Orders

## 2013-01-06 NOTE — Progress Notes (Signed)
Patient ID: Derek Hogan, male   DOB: [birth year], 67 y.o.   MRN: [id number]  HPI: Derek Hogan is a delightful 67 year old male with a history of CAD S/P previous inferior wall myocardial infarction followed by CABG 1996. Last cardiac catheterization was in Nov 2010 for Myoview with mild inferior ischemia. This showed three-vessel coronary artery disease with patent grafts. There was significant lesion in distal RCA after the graft insertion which compromised flow to distal PL. There was also some mild flow to this region antegrade via native vessel. Reviewed with Dr. Excell Seltzer and while this area may be a source of mild ischemia felt that trying to angioplasty the distal native RCA might compromise the flow in the SVG-PDA via competitive flow. Thus it was elected to continue medical management.   Carotids 0-39% B in 12/18/2011.  Admitted to [facility] in 07/2010 for CP and presyncope. BP was low. Given IVFs. Lisinopril cut back to 2.5 daily and Atenolol stopped. Cardiac markers negative. F/u Myoview EF 50%. Inferior infarct no ischemia.   Follow up: Reports he notices he is getting more fatigued than usual. Has noticed SOB in the past few months with going up hills or push ups. However he can do elliptical without any problem. Denies CP, edema, orthopnea, or PND. Light headedness has improved. Weight stable 240-243 lbs. No neuro symptoms.   ROS: All systems negative except as listed in HPI, PMH and Problem List.  Past Medical History  Diagnosis Date  . Diabetes mellitus     type 2  . Hypertension   . UNSPECIFIED PERIPHERAL VASCULAR DISEASE 10/06/2008  . HYPERTENSION, UNSPECIFIED 06/09/2008  . HYPERLIPIDEMIA-MIXED 06/09/2008  . DM 10/01/2008  . ERECTILE DYSFUNCTION 06/09/2008  . Hypotension   . Viral gastroenteritis     04/19/2010 improved  . Dyslipidemia   . GERD (gastroesophageal reflux disease)   . Weakness   . Heart attack   . PVD (peripheral vascular disease)     -- s/p right carotid endarterectomy by  Dr. Liliane Bade -- carotid u/s 0-39% (9/10) -- ABI L 0.95 R 1.0 (2/07)  . Erectile dysfunction   . CAD, ARTERY BYPASS GRAFT 06/09/2008    Current Outpatient Prescriptions  Medication Sig Dispense Refill  . aspirin 162 MG EC tablet Take 162 mg by mouth daily.      Marland Kitchen atorvastatin (LIPITOR) 80 MG tablet Take 1 tablet (80 mg total) by mouth daily.      . insulin aspart (NOVOLOG) 100 unit/mL injection Inject into the skin continuous. Insulin Pump      . lisinopril (PRINIVIL,ZESTRIL) 2.5 MG tablet Take 2.5 mg by mouth at bedtime.       Marland Kitchen omeprazole (PRILOSEC) 20 MG capsule Take 20 mg by mouth daily.        . sildenafil (VIAGRA) 100 MG tablet Take 100 mg by mouth daily as needed for erectile dysfunction.       Marland Kitchen zolpidem (AMBIEN) 10 MG tablet Take 10 mg by mouth at bedtime as needed for sleep.       No current facility-administered medications for this encounter.     Filed Vitals:   01/06/13 0916  BP: 128/60  Pulse: 88  Weight: 243 lb 8 oz (110.451 kg)  SpO2: 98%    PHYSICAL EXAM: General: Well appearing. No resp difficulty.  HEENT: normal Neck: supple. JVP flat. CEA scar. Carotids 2+ bilaterally; no bruits. No lymphadenopathy or thryomegaly appreciated. Cor: PMI normal. Regular rate & rhythm. No rubs, gallops or murmurs. Lungs: clear Abdomen:  soft, nontender, nondistended. No hepatosplenomegaly. No bruits or masses. Good bowel sounds. Extremities: no cyanosis, clubbing, rash, edema Neuro: alert & orientedx3, cranial nerves grossly intact. Moves all 4 extremities w/o difficulty. Affect pleasant. FTN ok. No pronator drift   ASSESSMENT & PLAN:  1) CAD, s/p CABG 1996 - No clear s/s ischemia - Will continue ASA and statin.  2) HTN - Stable - K+ elevated on ACE-I will recheck today and may need to d/c or consider another agent.  3) HLD - reports getting lipids checked at [facility] and are stable. - continue atorvastatin  4) Fatigue - Has noticed increase in fatigue and some SOB with  exertion. Last ECHO 2009, will get ECHO to assess EF.  5) Carotid stenosis - Carotids 0-39% B in 12/18/2011. - Will repeat q 2 years  6) Dyspnea - unclear etiology. Will proceed with echo and CPX.   Derek Hogan B NP-C 9:28 AM  Patient seen and examined with Derek Potash, NP. We discussed all aspects of the encounter. I agree with the assessment and plan as stated above.   Doing fairly well although has somewhat progressive dyspnea. No CP. Will proceed with echo and CPX testing. Will check carotids q2 years. Continue atorva.   Derek Bensimhon,MD 10:32 AM

## 2013-01-21 ENCOUNTER — Ambulatory Visit (HOSPITAL_COMMUNITY): Payer: Medicare Other

## 2013-01-21 ENCOUNTER — Ambulatory Visit (HOSPITAL_COMMUNITY)
Admission: RE | Admit: 2013-01-21 | Discharge: 2013-01-21 | Disposition: A | Discharge status: Home / Self Care | Payer: Medicare Other | Source: Ambulatory Visit | Attending: Internal Medicine | Admitting: Internal Medicine

## 2013-01-21 DIAGNOSIS — I517 Cardiomegaly: Secondary | ICD-10-CM

## 2013-01-21 DIAGNOSIS — R0602 Shortness of breath: Secondary | ICD-10-CM

## 2013-01-21 NOTE — Progress Notes (Signed)
*  Echocardiogram 2D Echocardiogram has been performed.  Derek Hogan 01/21/2013, 11:41 AM

## 2013-05-20 ENCOUNTER — Ambulatory Visit (HOSPITAL_COMMUNITY)
Admission: RE | Admit: 2013-05-20 | Discharge: 2013-05-20 | Disposition: A | Discharge status: Home / Self Care | Payer: Medicare Other | Source: Ambulatory Visit | Attending: Internal Medicine | Admitting: Internal Medicine

## 2013-05-20 VITALS — BP 132/64 | HR 73 | Wt 246.0 lb

## 2013-05-20 DIAGNOSIS — I6529 Occlusion and stenosis of unspecified carotid artery: Secondary | ICD-10-CM | POA: Insufficient documentation

## 2013-05-20 DIAGNOSIS — I2510 Atherosclerotic heart disease of native coronary artery without angina pectoris: Secondary | ICD-10-CM | POA: Insufficient documentation

## 2013-05-20 DIAGNOSIS — I10 Essential (primary) hypertension: Secondary | ICD-10-CM | POA: Insufficient documentation

## 2013-05-20 NOTE — Progress Notes (Signed)
Patient ID: Derek Hogan, male   DOB: October 24, 1945, 68 y.o.   MRN: [id number]  HPI: Derek Hogan is a delightful 68 year old male with a history of CAD S/P previous inferior wall myocardial infarction followed by CABG 1996. Last cardiac catheterization was in Nov 2010 for Myoview with mild inferior ischemia. This showed three-vessel coronary artery disease with patent grafts. There was significant lesion in distal RCA after the graft insertion which compromised flow to distal PL. There was also some mild flow to this region antegrade via native vessel. I reviewed with Dr. Burt Knack and while ths area may be a source of mild ischemia we felt that trying to angioplasty the distal native RCA might compromise the flow in the SVG-PDA via competitive flow. Thus we elected to continue medical management.   Carotids 0-39% B in 12/18/2011.  Admitted to [facility] in 6/12 for CP and presyncope. BP was low. Given IVFs. Lisinopril cut back to 2.5 daily and Atenolol stopped. Cardiac markers negative.  F/u Myoview EF 50%, inferior infarct, no ischemia.   Follow up: Since last visit, he had an echo in 12/14 showing EF 60-65% with moderate LAE.  CPX in 12/14 showed RER 1.21, peak VO2 22.5, VE/VCO2 slope 25.9.  This is normal functional capacity compared to age-matched norms.  At the time, he reported exertional dyspnea.  However, this seems to have improved considerably.  He uses an elliptical for several times a week, going 3-5 miles without dyspnea.  No chest pain.  Mild dyspnea only when walking up a steep hill.    ROS: All systems negative except as listed in HPI, PMH and Problem List.  Labs (9/14): K 4.4, creatinine 0.86, LDL 75, HDL 49 Labs (2/15): K 4.8, creatinine 0.9, HDL 39, LDL 76  Past Medical History  Diagnosis Date  . Diabetes mellitus     type 2  . Hypertension   . UNSPECIFIED PERIPHERAL VASCULAR DISEASE 10/06/2008  . HYPERTENSION, UNSPECIFIED 06/09/2008  . HYPERLIPIDEMIA-MIXED 06/09/2008  . DM 10/01/2008  .  ERECTILE DYSFUNCTION 06/09/2008  . Hypotension   . Viral gastroenteritis     04/19/2010 improved  . Dyslipidemia   . GERD (gastroesophageal reflux disease)   . Weakness   . Heart attack   . PVD (peripheral vascular disease)     -- s/p right carotid endarterectomy by Dr. Drucie Opitz -- carotid u/s 0-39% (9/10) -- ABI L 0.95 R 1.0 (2/07)  . Erectile dysfunction   . CAD, ARTERY BYPASS GRAFT 06/09/2008    Current Outpatient Prescriptions  Medication Sig Dispense Refill  . aspirin 162 MG EC tablet Take 162 mg by mouth daily.      Marland Kitchen atorvastatin (LIPITOR) 80 MG tablet Take 1 tablet (80 mg total) by mouth daily.      . insulin aspart (NOVOLOG) 100 unit/mL injection Inject into the skin continuous. Insulin Pump      . lisinopril (PRINIVIL,ZESTRIL) 2.5 MG tablet Take 2.5 mg by mouth at bedtime.       Marland Kitchen omeprazole (PRILOSEC) 20 MG capsule Take 20 mg by mouth daily.        . sildenafil (VIAGRA) 100 MG tablet Take 100 mg by mouth daily as needed for erectile dysfunction.       Marland Kitchen zolpidem (AMBIEN) 10 MG tablet Take 10 mg by mouth at bedtime as needed for sleep.       No current facility-administered medications for this encounter.    PHYSICAL EXAM: Filed Vitals:   05/20/13 1056  BP: 132/64  Pulse: 73  Weight: 246 lb (111.585 kg)  SpO2: 98%    General: Well appearing. No resp difficulty.  HEENT: normal Neck: supple. JVP flat. CEA scar. Carotids 2+ bilaterally; right bruit. No lymphadenopathy or thryomegaly appreciated. Cor: PMI normal. Regular rate & rhythm. No rubs, gallops or murmurs. Lungs: clear Abdomen: soft, nontender, nondistended. No hepatosplenomegaly. No bruits or masses. Good bowel sounds. Extremities: no cyanosis, clubbing, rash, edema Neuro: alert & orientedx3, cranial nerves grossly intact. Moves all 4 extremities w/o difficulty. Affect pleasant. FTN ok. No pronator drift  ASSESSMENT & PLAN:  1) CAD, s/p CABG 1996: No ischemic symptoms.  Recent echo and CPX showed no  significant abnormalities other than LAE on the echo.  - Will continue ASA, ACEI, and statin. 2) HTN: BP controlled on a low dose of lisinopril. 3) HLD: Good lipids in 2/15, continue statin.  4) Carotid stenosis: s/p CEA.  Patient is due to carotid doppler evaluation, I will arrange.   Followup in 6 months.   [name] 05/20/2013

## 2013-05-20 NOTE — Patient Instructions (Signed)
Your physician has requested that you have a carotid duplex. This test is an ultrasound of the carotid arteries in your neck. It looks at blood flow through these arteries that supply the brain with blood. Allow one hour for this exam. There are no restrictions or special instructions.  We will contact you in 6 months to schedule your next appointment.  

## 2013-05-26 ENCOUNTER — Encounter (INDEPENDENT_AMBULATORY_CARE_PROVIDER_SITE_OTHER): Payer: Medicare Other

## 2013-05-26 DIAGNOSIS — I6529 Occlusion and stenosis of unspecified carotid artery: Secondary | ICD-10-CM

## 2013-05-29 ENCOUNTER — Telehealth (HOSPITAL_COMMUNITY): Payer: Self-pay | Admitting: *Deleted

## 2013-05-29 NOTE — Telephone Encounter (Signed)
Called pt with carotid results, he is aware and agreeable will recheck in 2 years

## 2013-10-03 DIAGNOSIS — K219 Gastro-esophageal reflux disease without esophagitis: Secondary | ICD-10-CM | POA: Insufficient documentation

## 2013-11-09 ENCOUNTER — Ambulatory Visit (HOSPITAL_COMMUNITY)
Admission: RE | Admit: 2013-11-09 | Discharge: 2013-11-09 | Disposition: A | Discharge status: Home / Self Care | Payer: Medicare Other | Source: Ambulatory Visit | Attending: Internal Medicine | Admitting: Internal Medicine

## 2013-11-09 VITALS — BP 128/78 | HR 65 | Wt 245.2 lb

## 2013-11-09 DIAGNOSIS — I739 Peripheral vascular disease, unspecified: Secondary | ICD-10-CM | POA: Diagnosis not present

## 2013-11-09 DIAGNOSIS — Z951 Presence of aortocoronary bypass graft: Secondary | ICD-10-CM | POA: Diagnosis not present

## 2013-11-09 DIAGNOSIS — I6529 Occlusion and stenosis of unspecified carotid artery: Secondary | ICD-10-CM | POA: Diagnosis not present

## 2013-11-09 DIAGNOSIS — E785 Hyperlipidemia, unspecified: Secondary | ICD-10-CM | POA: Insufficient documentation

## 2013-11-09 DIAGNOSIS — Z9889 Other specified postprocedural states: Secondary | ICD-10-CM | POA: Diagnosis not present

## 2013-11-09 DIAGNOSIS — Z79899 Other long term (current) drug therapy: Secondary | ICD-10-CM | POA: Diagnosis not present

## 2013-11-09 DIAGNOSIS — Z7982 Long term (current) use of aspirin: Secondary | ICD-10-CM | POA: Diagnosis not present

## 2013-11-09 DIAGNOSIS — I10 Essential (primary) hypertension: Secondary | ICD-10-CM | POA: Insufficient documentation

## 2013-11-09 DIAGNOSIS — K219 Gastro-esophageal reflux disease without esophagitis: Secondary | ICD-10-CM | POA: Insufficient documentation

## 2013-11-09 DIAGNOSIS — I2510 Atherosclerotic heart disease of native coronary artery without angina pectoris: Secondary | ICD-10-CM | POA: Diagnosis not present

## 2013-11-09 DIAGNOSIS — Z794 Long term (current) use of insulin: Secondary | ICD-10-CM | POA: Diagnosis not present

## 2013-11-09 DIAGNOSIS — E119 Type 2 diabetes mellitus without complications: Secondary | ICD-10-CM | POA: Diagnosis not present

## 2013-11-09 DIAGNOSIS — Z87891 Personal history of nicotine dependence: Secondary | ICD-10-CM | POA: Diagnosis not present

## 2013-11-09 DIAGNOSIS — R0602 Shortness of breath: Secondary | ICD-10-CM

## 2013-11-09 DIAGNOSIS — N529 Male erectile dysfunction, unspecified: Secondary | ICD-10-CM | POA: Insufficient documentation

## 2013-11-09 NOTE — Addendum Note (Signed)
Encounter addended by: Kerry Dory, CMA on: 11/09/2013 11:01 AM<BR>     Documentation filed: Visit Diagnoses, Patient Instructions Section, Orders

## 2013-11-09 NOTE — Progress Notes (Signed)
Patient ID: Derek Hogan, male   DOB: 07-02-1945, 68 y.o.   MRN: [id number]  HPI: Derek Hogan is a delightful 68 year old male with a history of CAD S/P previous inferior wall myocardial infarction followed by CABG 1996. Last cardiac catheterization was in Nov 2010 for Myoview with mild inferior ischemia. This showed three-vessel coronary artery disease with patent grafts. There was significant lesion in distal RCA after the graft insertion which compromised flow to distal PL. There was also some mild flow to this region antegrade via native vessel. I reviewed with Dr. Burt Knack and while ths area may be a source of mild ischemia we felt that trying to angioplasty the distal native RCA might compromise the flow in the SVG-PDA via competitive flow. Thus we elected to continue medical management.     Admitted to [facility] in 6/12 for CP and presyncope. BP was low. Given IVFs. Lisinopril cut back to 2.5 daily and Atenolol stopped. Cardiac markers negative.  F/u Myoview EF 50%, inferior infarct, no ischemia.   Carotids 05/27/13: 0-39% bilaterally Echo 12/14: EF 60-65% moderate LAE CPX in 12/14 showed RER 1.21, peak VO2 22.5, VE/VCO2 slope 25.9.  Follow up: Overall ok but has good days and bad days. Harder to exercise at gym but still going every day. Last week on TM had near syncopal episode. Denies CP but does have more fatigue. Just doesn't feel right.   ROS: All systems negative except as listed in HPI, PMH and Problem List.  Labs (9/14): K 4.4, creatinine 0.86, LDL 75, HDL 49 Labs (2/15): K 4.8, creatinine 0.9, HDL 39, LDL 76 Labs (8/15): TC 127 TG 58 LDL 73 HDL 47  Past Medical History  Diagnosis Date  . Diabetes mellitus     type 2  . Hypertension   . UNSPECIFIED PERIPHERAL VASCULAR DISEASE 10/06/2008  . HYPERTENSION, UNSPECIFIED 06/09/2008  . HYPERLIPIDEMIA-MIXED 06/09/2008  . DM 10/01/2008  . ERECTILE DYSFUNCTION 06/09/2008  . Hypotension   . Viral gastroenteritis     04/19/2010 improved  .  Dyslipidemia   . GERD (gastroesophageal reflux disease)   . Weakness   . Heart attack   . PVD (peripheral vascular disease)     -- s/p right carotid endarterectomy by Dr. Drucie Opitz -- carotid u/s 0-39% (9/10) -- ABI L 0.95 R 1.0 (2/07)  . Erectile dysfunction   . CAD, ARTERY BYPASS GRAFT 06/09/2008    Current Outpatient Prescriptions  Medication Sig Dispense Refill  . aspirin 162 MG EC tablet Take 162 mg by mouth daily.      Marland Kitchen atorvastatin (LIPITOR) 80 MG tablet Take 1 tablet (80 mg total) by mouth daily.      . insulin aspart (NOVOLOG) 100 unit/mL injection Inject into the skin continuous. Insulin Pump      . lisinopril (PRINIVIL,ZESTRIL) 2.5 MG tablet Take 2.5 mg by mouth at bedtime.       Marland Kitchen omeprazole (PRILOSEC) 20 MG capsule Take 20 mg by mouth daily.        . sildenafil (VIAGRA) 100 MG tablet Take 100 mg by mouth daily as needed for erectile dysfunction.       Marland Kitchen zolpidem (AMBIEN) 10 MG tablet Take 10 mg by mouth at bedtime as needed for sleep.       No current facility-administered medications for this encounter.    PHYSICAL EXAM: Filed Vitals:   11/09/13 1012  BP: 128/78  Pulse: 65  Weight: 245 lb 4 oz (111.245 kg)  SpO2: 98%    General:  Well appearing. No resp difficulty.  HEENT: normal Neck: supple. JVP flat. CEA scar. Carotids 2+ bilaterally; right bruit. No lymphadenopathy or thryomegaly appreciated. Cor: PMI normal. Regular rate & rhythm. No rubs, gallops or murmurs. Lungs: clear Abdomen: soft, nontender, nondistended. No hepatosplenomegaly. No bruits or masses. Good bowel sounds. Extremities: no cyanosis, clubbing, rash, edema Neuro: alert & orientedx3, cranial nerves grossly intact. Moves all 4 extremities w/o difficulty. Affect pleasant. FTN ok. No pronator drift  ASSESSMENT & PLAN:  1) CAD, s/p CABG 1996: More fatigue recently. With vague tightness in test. Will schedule ETT/myoview - Will continue ASA, ACEI, and statin. 2) HTN: BP controlled on a low dose  of lisinopril. 3) HLD: Good lipids in 8/15, continue statin.  4) Carotid stenosis: s/p CEA.  Recent u/s looks good.   Followup in 6 months.   [name] MD 11/09/2013

## 2013-11-09 NOTE — Patient Instructions (Signed)
Your physician has requested that you have en exercise stress myoview. For further information please visit [email or web address]. Please follow instruction sheet, as given.  Your physician recommends that you schedule a follow-up appointment in: 6 months.  

## 2013-11-10 NOTE — Addendum Note (Signed)
Encounter addended by: Asencion Gowda, CCT on: 11/10/2013  7:30 AM<BR>     Documentation filed: Charges VN

## 2013-11-11 ENCOUNTER — Ambulatory Visit (HOSPITAL_COMMUNITY): Payer: Medicare Other | Attending: Internal Medicine | Admitting: Radiology

## 2013-11-11 ENCOUNTER — Encounter: Payer: Self-pay | Admitting: Cardiology

## 2013-11-11 VITALS — BP 142/68 | Ht 72.0 in | Wt 240.0 lb

## 2013-11-11 DIAGNOSIS — R0789 Other chest pain: Secondary | ICD-10-CM | POA: Diagnosis present

## 2013-11-11 DIAGNOSIS — I2510 Atherosclerotic heart disease of native coronary artery without angina pectoris: Secondary | ICD-10-CM | POA: Insufficient documentation

## 2013-11-11 DIAGNOSIS — R079 Chest pain, unspecified: Secondary | ICD-10-CM

## 2013-11-11 DIAGNOSIS — E119 Type 2 diabetes mellitus without complications: Secondary | ICD-10-CM | POA: Diagnosis not present

## 2013-11-11 DIAGNOSIS — R002 Palpitations: Secondary | ICD-10-CM | POA: Diagnosis not present

## 2013-11-11 DIAGNOSIS — R0602 Shortness of breath: Secondary | ICD-10-CM

## 2013-11-11 DIAGNOSIS — Z794 Long term (current) use of insulin: Secondary | ICD-10-CM | POA: Insufficient documentation

## 2013-11-11 DIAGNOSIS — I10 Essential (primary) hypertension: Secondary | ICD-10-CM | POA: Diagnosis not present

## 2013-11-11 MED ORDER — TECHNETIUM TC 99M SESTAMIBI GENERIC - CARDIOLITE
33.0000 | Freq: Once | INTRAVENOUS | Status: AC | PRN
  Administered 2013-11-11: 33 via INTRAVENOUS

## 2013-11-11 MED ORDER — TECHNETIUM TC 99M SESTAMIBI GENERIC - CARDIOLITE
11.0000 | Freq: Once | INTRAVENOUS | Status: AC | PRN
  Administered 2013-11-11: 11 via INTRAVENOUS

## 2013-11-11 NOTE — Progress Notes (Signed)
[facility] [address] [place], [place] [address] [phone]    Cardiology Nuclear Med Study  Derek Hogan is a 68 y.o. male     MRN : [id number]     DOB: Oct 04, 1945  Procedure Date: 11/11/2013  Nuclear Med Background Indication for Stress Test:  Evaluation for Ischemia and Graft Patency History:  '12 MPI: EF: 50% CAD Cardiac Risk Factors: Carotid Disease, Hypertension and IDDM   Symptoms:  Chest Tightness and Palpitations   Nuclear Pre-Procedure Caffeine/Decaff Intake:  None> 12 hrs NPO After: 8:30am   Lungs:  clear O2 Sat: 96% on room air. IV 0.9% NS with Angio Cath:  22g  IV Site: R Wrist x 1, tolerated well IV Started by:  Irven Baltimore, RN  Chest Size (in):  48 Cup Size: n/a  Height: 6' (1.829 m)  Weight:  240 lb (108.863 kg)  BMI:  Body mass index is 32.54 kg/(m^2). Tech Comments:  Fasting CBG was 134 at 0700 today, no insulin this am. 4 hr PC was 204 at 1250 today per patient. Irven Baltimore, RN.    Nuclear Med Study 1 or 2 day study: 1 day  Stress Test Type:  Stress  Reading MD: N/A  Order Authorizing Provider:  [name], MD  Resting Radionuclide: Technetium 5m Sestamibi  Resting Radionuclide Dose: 11.0 mCi   Stress Radionuclide:  Technetium 37m Sestamibi  Stress Radionuclide Dose: 33.0 mCi           Stress Protocol Rest HR: 68 Stress HR: 139  Rest BP: 142/68 Stress BP: 205/59  Exercise Time (min): 7:00 METS: 8.50   Predicted Max HR: 151 bpm % Max HR: 92.05 bpm Rate Pressure Product: 14431   Dose of Adenosine (mg):  n/a Dose of Lexiscan: n/a mg  Dose of Atropine (mg): n/a Dose of Dobutamine: n/a mcg/kg/min (at max HR)  Stress Test Technologist: Perrin Maltese, EMT-P  Nuclear Technologist:  Vedia Pereyra, CNMT     Rest Procedure:  Myocardial perfusion imaging was performed at rest 45 minutes following the intravenous administration of Technetium 61m Sestamibi. Rest ECG: NSR with rSR' in V1 c/w RV  conduction delay  Stress Procedure:  The patient exercised on the treadmill utilizing the Bruce Protocol for 7:00 minutes. The patient stopped due to fatigue, and chest pain.  Technetium 48m Sestamibi was injected at peak exercise and myocardial perfusion imaging was performed after a brief delay. Stress ECG: There was less than 6mm of horizontal ST segment depression in the lateral precordial leads in early recovery   QPS Raw Data Images:  Mild diaphragmatic attenuation.  Normal left ventricular size. Stress Images:  There is a small sized, medium intensity defect in the apical lateral wall.  There is a medium sized, moderate to severe intensity defect in the inferior wall Rest Images:  There is a small sized, medium intensity defect in the apical lateral wall.  There is a medium sized, moderate to severe intensity defect in the inferior wall Subtraction (SDS):  No evidence of ischemia. Transient Ischemic Dilatation (Normal <1.22):  0.97 Lung/Heart Ratio (Normal <0.45):  0.40  Quantitative Gated Spect Images QGS EDV:  125 ml QGS ESV:  67 ml  Impression Exercise Capacity:  Fair exercise capacity. BP Response:  Hypertensive blood pressure response. Clinical Symptoms:  Typical chest pain. ECG Impression:  There was less than 36mm of horizontal ST segment depression in the lateral precordial leads in early recovery  Comparison with Prior Nuclear Study: No  images to compare  Overall Impression:  Intermediate risk stress nuclear study  There is a small sized, medium intensity fixed defect in the apical lateral wall.  There is a medium sized, moderate to severe intensity fixed defect in the inferior wall.  No evidence of ischemia is noted..  LV Ejection Fraction: 47%.  LV Wall Motion:  Mild global hypokinesis  Signed: Fransico Him, MD [facility] 11/11/2013

## 2014-05-17 ENCOUNTER — Encounter (HOSPITAL_COMMUNITY): Payer: Medicare Other

## 2014-07-01 ENCOUNTER — Ambulatory Visit (HOSPITAL_COMMUNITY)
Admission: RE | Admit: 2014-07-01 | Discharge: 2014-07-01 | Disposition: A | Discharge status: Home / Self Care | Payer: Medicare Other | Source: Ambulatory Visit | Attending: Cardiology | Admitting: Cardiology

## 2014-07-01 ENCOUNTER — Encounter (HOSPITAL_COMMUNITY): Payer: Self-pay

## 2014-07-01 VITALS — BP 110/62 | HR 81 | Wt 240.8 lb

## 2014-07-01 DIAGNOSIS — E785 Hyperlipidemia, unspecified: Secondary | ICD-10-CM

## 2014-07-01 DIAGNOSIS — K219 Gastro-esophageal reflux disease without esophagitis: Secondary | ICD-10-CM | POA: Diagnosis not present

## 2014-07-01 DIAGNOSIS — I10 Essential (primary) hypertension: Secondary | ICD-10-CM | POA: Diagnosis not present

## 2014-07-01 DIAGNOSIS — I739 Peripheral vascular disease, unspecified: Secondary | ICD-10-CM | POA: Diagnosis not present

## 2014-07-01 DIAGNOSIS — I2510 Atherosclerotic heart disease of native coronary artery without angina pectoris: Secondary | ICD-10-CM

## 2014-07-01 DIAGNOSIS — Z79899 Other long term (current) drug therapy: Secondary | ICD-10-CM | POA: Diagnosis not present

## 2014-07-01 DIAGNOSIS — I6529 Occlusion and stenosis of unspecified carotid artery: Secondary | ICD-10-CM | POA: Diagnosis not present

## 2014-07-01 DIAGNOSIS — Z794 Long term (current) use of insulin: Secondary | ICD-10-CM | POA: Insufficient documentation

## 2014-07-01 DIAGNOSIS — Z7982 Long term (current) use of aspirin: Secondary | ICD-10-CM | POA: Insufficient documentation

## 2014-07-01 DIAGNOSIS — Z951 Presence of aortocoronary bypass graft: Secondary | ICD-10-CM | POA: Diagnosis not present

## 2014-07-01 DIAGNOSIS — I6523 Occlusion and stenosis of bilateral carotid arteries: Secondary | ICD-10-CM

## 2014-07-01 DIAGNOSIS — I252 Old myocardial infarction: Secondary | ICD-10-CM | POA: Insufficient documentation

## 2014-07-01 DIAGNOSIS — E119 Type 2 diabetes mellitus without complications: Secondary | ICD-10-CM | POA: Diagnosis not present

## 2014-07-01 DIAGNOSIS — E782 Mixed hyperlipidemia: Secondary | ICD-10-CM | POA: Diagnosis not present

## 2014-07-01 NOTE — Patient Instructions (Signed)
Fasting labs needed, orders placed ok for you to have done at the [facility] location  Your physician recommends that you schedule a follow-up appointment in: 12 months

## 2014-07-01 NOTE — Progress Notes (Signed)
Patient ID: Derek Hogan, male   DOB: [birth year], 69 y.o.   MRN: [id number]  HPI: Derek Hogan is a delightful 69 year old male with a history of CAD S/P previous inferior wall myocardial infarction followed by CABG 1996. Last cardiac catheterization was in Nov 2010 for Myoview with mild inferior ischemia. This showed three-vessel coronary artery disease with patent grafts. There was significant lesion in distal RCA after the graft insertion which compromised flow to distal PL. There was also some mild flow to this region antegrade via native vessel. I reviewed with Dr. Burt Knack and while ths area may be a source of mild ischemia we felt that trying to angioplasty the distal native RCA might compromise the flow in the SVG-PDA via competitive flow. Thus we elected to continue medical management.     Admitted to [facility] in 6/12 for CP and presyncope. BP was low. Given IVFs. Lisinopril cut back to 2.5 daily and Atenolol stopped. Cardiac markers negative.  F/u Myoview EF 50%, inferior infarct, no ischemia.   Carotids 05/27/13: 0-39% bilaterally Echo 12/14: EF 60-65% moderate LAE CPX in 12/14 showed RER 1.21, peak VO2 22.5, VE/VCO2 slope 25.9. Myoview 10/15 EF 47% Fixed defect inferiorly  Echo at  At [facility] EF > 55%    Follow up: Overall doing well. Had ? TIA in 3/16 seen at [facility]. Head CT normal. CTA mild blockage in R vertebral artery. Mild luminal in carotids Otherwise normal. Still exercising 5 days per week. Elliptical 30 minutes + weights.   ROS: All systems negative except as listed in HPI, PMH and Problem List.  Labs (9/14): K 4.4, creatinine 0.86, LDL 75, HDL 49 Labs (2/15): K 4.8, creatinine 0.9, HDL 39, LDL 76 Labs (8/15): TC 127 TG 58 LDL 73 HDL 47 Labs (3/16): creatinine1.04  Past Medical History  Diagnosis Date  . Diabetes mellitus     type 2  . Hypertension   . UNSPECIFIED PERIPHERAL VASCULAR DISEASE 10/06/2008  . HYPERTENSION, UNSPECIFIED 06/09/2008  . HYPERLIPIDEMIA-MIXED 06/09/2008   . DM 10/01/2008  . ERECTILE DYSFUNCTION 06/09/2008  . Hypotension   . Viral gastroenteritis     04/19/2010 improved  . Dyslipidemia   . GERD (gastroesophageal reflux disease)   . Weakness   . Heart attack   . PVD (peripheral vascular disease)     -- s/p right carotid endarterectomy by Dr. Drucie Opitz -- carotid u/s 0-39% (9/10) -- ABI L 0.95 R 1.0 (2/07)  . Erectile dysfunction   . CAD, ARTERY BYPASS GRAFT 06/09/2008    Current Outpatient Prescriptions  Medication Sig Dispense Refill  . aspirin 162 MG EC tablet Take 162 mg by mouth daily.    Marland Kitchen atorvastatin (LIPITOR) 80 MG tablet Take 1 tablet (80 mg total) by mouth daily.    . insulin aspart (NOVOLOG) 100 unit/mL injection Inject into the skin continuous. Insulin Pump    . lisinopril (PRINIVIL,ZESTRIL) 2.5 MG tablet Take 2.5 mg by mouth at bedtime.     Marland Kitchen omeprazole (PRILOSEC) 20 MG capsule Take 20 mg by mouth daily.      . sildenafil (VIAGRA) 100 MG tablet Take 100 mg by mouth daily as needed for erectile dysfunction.     Marland Kitchen zolpidem (AMBIEN) 10 MG tablet Take 10 mg by mouth at bedtime as needed for sleep.     No current facility-administered medications for this encounter.    PHYSICAL EXAM: Filed Vitals:   07/01/14 0956  BP: 110/62  Pulse: 81  Weight: 240 lb 12.8 oz (109.226  kg)  SpO2: 96%    General: Well appearing. No resp difficulty.  HEENT: normal Neck: supple. JVP flat. CEA scar. Carotids 2+ bilaterally; right bruit. No lymphadenopathy or thryomegaly appreciated. Cor: PMI normal. Regular rate & rhythm. No rubs, gallops or murmurs. Lungs: clear Abdomen: soft, nontender, nondistended. No hepatosplenomegaly. No bruits or masses. Good bowel sounds. Extremities: no cyanosis, clubbing, rash, edema Neuro: alert & orientedx3, cranial nerves grossly intact. Moves all 4 extremities w/o difficulty. Affect pleasant. FTN ok. No pronator drift  ASSESSMENT & PLAN:  1) CAD, s/p CABG 1996:  2) HTN: BP controlled on a low dose of  lisinopril. 3) HLD: Good lipids in 8/15, continue statin.  4) Carotid stenosis: s/p CEA.  Recent u/s looks good.   Followup in 6 months.   [name] MD 07/01/2014

## 2014-07-08 ENCOUNTER — Other Ambulatory Visit (INDEPENDENT_AMBULATORY_CARE_PROVIDER_SITE_OTHER): Payer: Medicare Other

## 2014-07-08 DIAGNOSIS — I6523 Occlusion and stenosis of bilateral carotid arteries: Secondary | ICD-10-CM | POA: Diagnosis not present

## 2014-07-09 LAB — COMPREHENSIVE METABOLIC PANEL
ALT: 28 IU/L (ref 0–44)
AST: 30 IU/L (ref 0–40)
Albumin/Globulin Ratio: 1.9 (ref 1.1–2.5)
Albumin: 4.1 g/dL (ref 3.6–4.8)
Alkaline Phosphatase: 66 IU/L (ref 39–117)
BILIRUBIN TOTAL: 0.6 mg/dL (ref 0.0–1.2)
BUN/Creatinine Ratio: 14 (ref 10–22)
BUN: 12 mg/dL (ref 8–27)
CO2: 21 mmol/L (ref 18–29)
Calcium: 9 mg/dL (ref 8.6–10.2)
Chloride: 104 mmol/L (ref 97–108)
Creatinine, Ser: 0.88 mg/dL (ref 0.76–1.27)
GFR calc Af Amer: 101 mL/min/{1.73_m2} (ref >59)
GFR calc non Af Amer: 88 mL/min/{1.73_m2} (ref >59)
GLOBULIN, TOTAL: 2.2 g/dL (ref 1.5–4.5)
Glucose: 74 mg/dL (ref 65–99)
Potassium: 4.6 mmol/L (ref 3.5–5.2)
Sodium: 139 mmol/L (ref 134–144)
TOTAL PROTEIN: 6.3 g/dL (ref 6.0–8.5)

## 2014-07-09 LAB — LIPID PANEL
CHOL/HDL RATIO: 2.7 ratio (ref 0.0–5.0)
Cholesterol, Total: 116 mg/dL (ref 100–199)
HDL: 43 mg/dL (ref >39)
LDL CALC: 63 mg/dL (ref 0–99)
Triglycerides: 51 mg/dL (ref 0–149)
VLDL Cholesterol Cal: 10 mg/dL (ref 5–40)

## 2014-07-13 ENCOUNTER — Encounter (HOSPITAL_COMMUNITY): Payer: Self-pay | Admitting: Cardiology

## 2014-08-04 DIAGNOSIS — I700 Atherosclerosis of aorta: Secondary | ICD-10-CM | POA: Insufficient documentation

## 2014-08-27 ENCOUNTER — Ambulatory Visit
Admission: RE | Admit: 2014-08-27 | Discharge: 2014-08-27 | Disposition: A | Discharge status: Home / Self Care | Payer: Medicare Other | Source: Ambulatory Visit | Attending: Physician Assistant | Admitting: Physician Assistant

## 2014-08-27 ENCOUNTER — Other Ambulatory Visit: Payer: Self-pay | Admitting: Physician Assistant

## 2014-08-27 DIAGNOSIS — M79604 Pain in right leg: Secondary | ICD-10-CM

## 2014-08-27 DIAGNOSIS — R600 Localized edema: Secondary | ICD-10-CM | POA: Diagnosis not present

## 2014-09-21 ENCOUNTER — Ambulatory Visit (INDEPENDENT_AMBULATORY_CARE_PROVIDER_SITE_OTHER): Payer: Medicare Other | Admitting: Podiatry

## 2014-09-21 ENCOUNTER — Encounter: Payer: Self-pay | Admitting: Podiatry

## 2014-09-21 ENCOUNTER — Ambulatory Visit (INDEPENDENT_AMBULATORY_CARE_PROVIDER_SITE_OTHER): Payer: Medicare Other

## 2014-09-21 VITALS — BP 149/67 | HR 78 | Resp 18

## 2014-09-21 DIAGNOSIS — M722 Plantar fascial fibromatosis: Secondary | ICD-10-CM | POA: Diagnosis not present

## 2014-09-21 DIAGNOSIS — I6523 Occlusion and stenosis of bilateral carotid arteries: Secondary | ICD-10-CM

## 2014-09-21 DIAGNOSIS — L601 Onycholysis: Secondary | ICD-10-CM | POA: Diagnosis not present

## 2014-09-21 MED ORDER — CEPHALEXIN 500 MG PO CAPS: 500.0000 mg | ORAL_CAPSULE | Freq: Three times a day (TID) | ORAL | Status: DC

## 2014-09-21 NOTE — Patient Instructions (Signed)
Soak Instructions    THE DAY AFTER THE PROCEDURE  Place 1/4 cup of epsom salts in a quart of warm tap water.  Submerge your foot or feet with outer bandage intact for the initial soak; this will allow the bandage to become moist and wet for easy lift off.  Once you remove your bandage, continue to soak in the solution for 20 minutes.  This soak should be done twice a day.  Next, remove your foot or feet from solution, blot dry the affected area and cover.  You may use a band aid large enough to cover the area or use gauze and tape.  Apply other medications to the area as directed by the doctor such as polysporin neosporin.  IF YOUR SKIN BECOMES IRRITATED WHILE USING THESE INSTRUCTIONS, IT IS OKAY TO SWITCH TO  WHITE VINEGAR AND WATER. Or you may use antibacterial soap and water to keep the toe clean  Monitor for any signs/symptoms of infection. Call the office immediately if any occur or go directly to the emergency room. Call with any questions/concerns.   Plantar Fasciitis (Heel Spur Syndrome) with Rehab The plantar fascia is a fibrous, ligament-like, soft-tissue structure that spans the bottom of the foot. Plantar fasciitis is a condition that causes pain in the foot due to inflammation of the tissue. SYMPTOMS   Pain and tenderness on the underneath side of the foot.  Pain that worsens with standing or walking. CAUSES  Plantar fasciitis is caused by irritation and injury to the plantar fascia on the underneath side of the foot. Common mechanisms of injury include:  Direct trauma to bottom of the foot.  Damage to a small nerve that runs under the foot where the main fascia attaches to the heel bone.  Stress placed on the plantar fascia due to bone spurs. RISK INCREASES WITH:   Activities that place stress on the plantar fascia (running, jumping, pivoting, or cutting).  Poor strength and flexibility.  Improperly fitted shoes.  Tight calf muscles.  Flat feet.  Failure to  warm-up properly before activity.  Obesity. PREVENTION  Warm up and stretch properly before activity.  Allow for adequate recovery between workouts.  Maintain physical fitness:  Strength, flexibility, and endurance.  Cardiovascular fitness.  Maintain a health body weight.  Avoid stress on the plantar fascia.  Wear properly fitted shoes, including arch supports for individuals who have flat feet. PROGNOSIS  If treated properly, then the symptoms of plantar fasciitis usually resolve without surgery. However, occasionally surgery is necessary. RELATED COMPLICATIONS   Recurrent symptoms that may result in a chronic condition.  Problems of the lower back that are caused by compensating for the injury, such as limping.  Pain or weakness of the foot during push-off following surgery.  Chronic inflammation, scarring, and partial or complete fascia tear, occurring more often from repeated injections. TREATMENT  Treatment initially involves the use of ice and medication to help reduce pain and inflammation. The use of strengthening and stretching exercises may help reduce pain with activity, especially stretches of the Achilles tendon. These exercises may be performed at home or with a therapist. Your caregiver may recommend that you use heel cups of arch supports to help reduce stress on the plantar fascia. Occasionally, corticosteroid injections are given to reduce inflammation. If symptoms persist for greater than 6 months despite non-surgical (conservative), then surgery may be recommended.  MEDICATION   If pain medication is necessary, then nonsteroidal anti-inflammatory medications, such as aspirin and ibuprofen, or other  minor pain relievers, such as acetaminophen, are often recommended.  Do not take pain medication within 7 days before surgery.  Prescription pain relievers may be given if deemed necessary by your caregiver. Use only as directed and only as much as you  need.  Corticosteroid injections may be given by your caregiver. These injections should be reserved for the most serious cases, because they may only be given a certain number of times. HEAT AND COLD  Cold treatment (icing) relieves pain and reduces inflammation. Cold treatment should be applied for 10 to 15 minutes every 2 to 3 hours for inflammation and pain and immediately after any activity that aggravates your symptoms. Use ice packs or massage the area with a piece of ice (ice massage).  Heat treatment may be used prior to performing the stretching and strengthening activities prescribed by your caregiver, physical therapist, or athletic trainer. Use a heat pack or soak the injury in warm water. SEEK IMMEDIATE MEDICAL CARE IF:  Treatment seems to offer no benefit, or the condition worsens.  Any medications produce adverse side effects. EXERCISES RANGE OF MOTION (ROM) AND STRETCHING EXERCISES - Plantar Fasciitis (Heel Spur Syndrome) These exercises may help you when beginning to rehabilitate your injury. Your symptoms may resolve with or without further involvement from your physician, physical therapist or athletic trainer. While completing these exercises, remember:   Restoring tissue flexibility helps normal motion to return to the joints. This allows healthier, less painful movement and activity.  An effective stretch should be held for at least 30 seconds.  A stretch should never be painful. You should only feel a gentle lengthening or release in the stretched tissue. RANGE OF MOTION - Toe Extension, Flexion  Sit with your right / left leg crossed over your opposite knee.  Grasp your toes and gently pull them back toward the top of your foot. You should feel a stretch on the bottom of your toes and/or foot.  Hold this stretch for __________ seconds.  Now, gently pull your toes toward the bottom of your foot. You should feel a stretch on the top of your toes and or  foot.  Hold this stretch for __________ seconds. Repeat __________ times. Complete this stretch __________ times per day.  RANGE OF MOTION - Ankle Dorsiflexion, Active Assisted  Remove shoes and sit on a chair that is preferably not on a carpeted surface.  Place right / left foot under knee. Extend your opposite leg for support.  Keeping your heel down, slide your right / left foot back toward the chair until you feel a stretch at your ankle or calf. If you do not feel a stretch, slide your bottom forward to the edge of the chair, while still keeping your heel down.  Hold this stretch for __________ seconds. Repeat __________ times. Complete this stretch __________ times per day.  STRETCH - Gastroc, Standing  Place hands on wall.  Extend right / left leg, keeping the front knee somewhat bent.  Slightly point your toes inward on your back foot.  Keeping your right / left heel on the floor and your knee straight, shift your weight toward the wall, not allowing your back to arch.  You should feel a gentle stretch in the right / left calf. Hold this position for __________ seconds. Repeat __________ times. Complete this stretch __________ times per day. STRETCH - Soleus, Standing  Place hands on wall.  Extend right / left leg, keeping the other knee somewhat bent.  Slightly point  your toes inward on your back foot.  Keep your right / left heel on the floor, bend your back knee, and slightly shift your weight over the back leg so that you feel a gentle stretch deep in your back calf.  Hold this position for __________ seconds. Repeat __________ times. Complete this stretch __________ times per day. STRETCH - Gastrocsoleus, Standing  Note: This exercise can place a lot of stress on your foot and ankle. Please complete this exercise only if specifically instructed by your caregiver.   Place the ball of your right / left foot on a step, keeping your other foot firmly on the same  step.  Hold on to the wall or a rail for balance.  Slowly lift your other foot, allowing your body weight to press your heel down over the edge of the step.  You should feel a stretch in your right / left calf.  Hold this position for __________ seconds.  Repeat this exercise with a slight bend in your right / left knee. Repeat __________ times. Complete this stretch __________ times per day.  STRENGTHENING EXERCISES - Plantar Fasciitis (Heel Spur Syndrome)  These exercises may help you when beginning to rehabilitate your injury. They may resolve your symptoms with or without further involvement from your physician, physical therapist or athletic trainer. While completing these exercises, remember:   Muscles can gain both the endurance and the strength needed for everyday activities through controlled exercises.  Complete these exercises as instructed by your physician, physical therapist or athletic trainer. Progress the resistance and repetitions only as guided. STRENGTH - Towel Curls  Sit in a chair positioned on a non-carpeted surface.  Place your foot on a towel, keeping your heel on the floor.  Pull the towel toward your heel by only curling your toes. Keep your heel on the floor.  If instructed by your physician, physical therapist or athletic trainer, add ____________________ at the end of the towel. Repeat __________ times. Complete this exercise __________ times per day. STRENGTH - Ankle Inversion  Secure one end of a rubber exercise band/tubing to a fixed object (table, pole). Loop the other end around your foot just before your toes.  Place your fists between your knees. This will focus your strengthening at your ankle.  Slowly, pull your big toe up and in, making sure the band/tubing is positioned to resist the entire motion.  Hold this position for __________ seconds.  Have your muscles resist the band/tubing as it slowly pulls your foot back to the starting  position. Repeat __________ times. Complete this exercises __________ times per day.  Document Released: 01/22/2005 Document Revised: 04/16/2011 Document Reviewed: 05/06/2008 [facility] Patient Information 2015 [place], [place]. This information is not intended to replace advice given to you by your health care provider. Make sure you discuss any questions you have with your health care provider.

## 2014-09-21 NOTE — Progress Notes (Signed)
Subjective:    Patient ID: Derek Hogan, male    DOB: 1945/12/01, 69 y.o.   MRN: [id number]  HPI  69 year old male presents the office with concerns of right big toenail discoloration is his been ongoing for approximately one month. He states that he has noticed some swelling to his big toe. He also disease and swelling to his leg and pain to his calf after a long drive. He did have a venous duplex performed apparently which was negative. He states the pain is resolving although he is 1 physical therapy for his right leg starting today. His big toenail on the right side is somewhat tender along the end of the toenail is loose. Is also noticed a dark spot forming within the toenail.  He also states that that he will couple was having heel pain. Difficulty getting on the bed. He states that as he walked little bit the patient to use off. He denies any history of injury or trauma. No swelling or redness. No tendon or numbness. The pain did not waken him at night. He does not change she is recently when he changes activity. No other complaints at this time.  Review of Systems  Cardiovascular:       SWELLING  Musculoskeletal:       MUSCLE PAIN   Hematological: Bruises/bleeds easily.  All other systems reviewed and are negative.      Objective:   Physical Exam AAO x3, NAD DP/PT pulses palpable bilaterally, CRT less than 3 seconds Protective sensation intact with Simms Weinstein monofilament, vibratory sensation intact, Achilles tendon reflex intact Tenderness to palpation overlying the plantar medial tubercle of the calcaneus to the left heel at the insertion of the plantar fascia. There is no pain along the course of plantar fasciawithin the arch of the foot. There is no pain with lateral compression of the calcaneus or pain the vibratory sensation. No pain on the posterior aspect of the calcaneus or along the course/insertion of the Achilles tendon. On the right hallux toenail is loose  distally from a chair approximate. There is mild incurvation on the distal medial/lateral nail borders. There is evidence of subungual hematoma on the central aspect of the toenail. There is slight edema and faint erythema around the toenail distally. There is no drainage or purulence on the toenail. No ascending tendinitis. No fractures or crepitus. No malodor. There is no overlying edema, erythema, increase in warmth elsewhere. No other areas of tenderness palpation or pain with vibratory sensation to the foot/ankle. MMT 5/5, ROM WNL No open lesions or pre-ulcerative lesions are identified. No pain with calf compression, swelling, warmth, erythema.    Assessment & Plan:  69 year old male with likely plantar fasciitis left foot, onycholysis right hallux toenail  1. Left heel pain -Likely result of plantar fasciitis. -X-rays were obtained and reviewed with the patient.  -Patient elects to proceed with steroid injection into the left heel. Under sterile skin preparation, a total of 2.5cc of kenalog 10, 0.5% Marcaine plain, and 2% lidocaine plain were infiltrated into the symptomatic area without complication. A band-aid was applied. Patient tolerated the injection well without complication. Post-injection care with discussed with the patient. Discussed with the patient to ice the area over the next couple of days to help prevent a steroid flare.  -Plantar fascial brace -Stretching and icing activities daily. -Shoe gear modifications -Discussed orthotics  2. Right hallux onycholysis with mild edema and erythema -L sharply debrided without complication/bleeding. The nail is loose distally  and only firmly adhered proximal leg. There is no drainage or purulence expressed. After debridement of the nail there was resolution of pain. Due to the erythema prescribed Keflex. Marland Kitchenmwinfectino  -Follow-up 2 weeks or sooner if any problems arise. In the meantime, encouraged to call the office with any questions,  concerns, change in symptoms.   Celesta Gentile, DPM

## 2014-09-22 ENCOUNTER — Encounter: Payer: Self-pay | Admitting: Podiatry

## 2014-09-28 ENCOUNTER — Ambulatory Visit: Payer: Medicare Other | Admitting: Podiatry

## 2014-10-05 ENCOUNTER — Ambulatory Visit: Payer: Medicare Other | Admitting: Podiatry

## 2014-10-14 ENCOUNTER — Ambulatory Visit (INDEPENDENT_AMBULATORY_CARE_PROVIDER_SITE_OTHER): Payer: Medicare Other | Admitting: Podiatry

## 2014-10-14 ENCOUNTER — Encounter: Payer: Self-pay | Admitting: Podiatry

## 2014-10-14 VITALS — BP 143/67 | HR 78 | Resp 18

## 2014-10-14 DIAGNOSIS — L989 Disorder of the skin and subcutaneous tissue, unspecified: Secondary | ICD-10-CM

## 2014-10-14 DIAGNOSIS — I6523 Occlusion and stenosis of bilateral carotid arteries: Secondary | ICD-10-CM | POA: Diagnosis not present

## 2014-10-14 DIAGNOSIS — L853 Xerosis cutis: Secondary | ICD-10-CM

## 2014-10-14 NOTE — Progress Notes (Signed)
Patient ID: Derek Hogan, male   DOB: 06-28-1945, 69 y.o.   MRN: [id number]  Subjective: 69 year old male presents the office today with concerns of skin cracking and dry skin on his right foot. He states his ongoing fluoroscopy weeks and started at the last appointment. He was soaking his feet quite a bit in Epson salts and he believes that this may have started it. Has a crack in the skin on the heel which is causing pain and reports pressure to the area. He denies any drainage as well as from the redness. Denies any itching. No recent injury or trauma. He says the pain in the left foot is better he longer has any pain this time. No other complaints at this time in no acute changes.  Objective: AAO 3, NAD DP/PT pulses palpable, CRT less than 3 seconds Protective sensation intact with Simms Weinstein monofilament On the plantar aspect of the right heel there is a fissure within the scan with hyperkeratotic tissue over on the area. Upon debridement there is no underlying ulceration, drainage or other signs of infection. There is also hyperkeratotic lesion medial hallux and submetatarsal one. Upon debridement no underlying ulceration, drainage or other signs of infection. There is a small amount of dry, peeling skin on the plantar aspect of the foot. At this time there is no tenderness palpation on the plantar medial tubercle of the calcaneus at the insertion of the plantar fascial in the left foot. No pain left at this time. No other areas of tenderness to bilateral lower extremities.  No overlying edema, erythema, increased warmth.  Assessment: 69 year old male resolve left heel pain, right foot skin fissuring/ xerosis   Plan: -Treatment options discussed including all alternatives, risks, and complications -Hyperkeratotic lesion/heel fissure sharply debrided without complication/bleeding. Recommend he continue daily moisturizer. Discussed with him to apply a small amount of loose plastic  wrap around his foot daily to help trap moisture in. After a couple hours removing the wash the area. If it becomes too wet/macerated to hold off. Do not apply over toes. -Continue stretching, icing, shoe gear changes the left foot to help prevent recurrence of plantar fasciitis. -If symptoms are not resolving the next 2-3 weeks follow-up or sooner if any problems are to arise. In the meantime call the office with any questions, concerns, change in symptoms.  Celesta Gentile, DPM

## 2014-11-04 ENCOUNTER — Telehealth: Payer: Self-pay | Admitting: Internal Medicine

## 2014-11-04 NOTE — Telephone Encounter (Signed)
Patient wanted to be scheduled for new patient appt at [facility] [place] office.  Scheduled with [facility] for November.  Patient wants labs drawn before being seen.  Would Bensimhon be ok with ordering these  ?

## 2014-11-22 LAB — ABI

## 2014-11-24 ENCOUNTER — Telehealth (HOSPITAL_COMMUNITY): Payer: Self-pay | Admitting: *Deleted

## 2014-11-24 NOTE — Telephone Encounter (Signed)
Pt called due to leg pain since July. Had an u/s of his lower extremity no sign of DVT. Pt says it hurts and can't exercise.  PCP sent him to PT had no relief. Spoke with Bensimhon about this.  Bensimhon wants him to have ABI's  Spoke with pt and he had ABI's at [facility]  Will get results and bring them up here tomorrow.   Pt is going to also bring a copy of his labs that he had drawn at the [place] too.

## 2014-11-24 NOTE — Telephone Encounter (Signed)
Pt wants to stay with Dr Haroldine Laws.

## 2014-11-26 ENCOUNTER — Other Ambulatory Visit (HOSPITAL_COMMUNITY): Payer: Self-pay | Admitting: *Deleted

## 2014-11-26 ENCOUNTER — Telehealth (HOSPITAL_COMMUNITY): Payer: Self-pay | Admitting: *Deleted

## 2014-11-26 DIAGNOSIS — I739 Peripheral vascular disease, unspecified: Secondary | ICD-10-CM

## 2014-11-26 NOTE — Telephone Encounter (Signed)
Referral to Dr Fletcher Anon for PAD evaulation

## 2014-11-26 NOTE — Telephone Encounter (Signed)
Referral order put in for pt to see Dr Fletcher Anon in [facility] for PAD evaluation

## 2014-12-07 ENCOUNTER — Other Ambulatory Visit: Payer: Self-pay | Admitting: Cardiology

## 2014-12-07 DIAGNOSIS — I6523 Occlusion and stenosis of bilateral carotid arteries: Secondary | ICD-10-CM

## 2014-12-10 ENCOUNTER — Ambulatory Visit (INDEPENDENT_AMBULATORY_CARE_PROVIDER_SITE_OTHER): Payer: Medicare Other

## 2014-12-10 DIAGNOSIS — I6523 Occlusion and stenosis of bilateral carotid arteries: Secondary | ICD-10-CM | POA: Diagnosis not present

## 2014-12-13 ENCOUNTER — Ambulatory Visit (INDEPENDENT_AMBULATORY_CARE_PROVIDER_SITE_OTHER): Payer: Medicare Other | Admitting: Cardiovascular Disease

## 2014-12-13 ENCOUNTER — Encounter: Payer: Self-pay | Admitting: Cardiovascular Disease

## 2014-12-13 VITALS — BP 160/76 | HR 76 | Ht 72.0 in | Wt 242.5 lb

## 2014-12-13 DIAGNOSIS — I2510 Atherosclerotic heart disease of native coronary artery without angina pectoris: Secondary | ICD-10-CM

## 2014-12-13 DIAGNOSIS — I6523 Occlusion and stenosis of bilateral carotid arteries: Secondary | ICD-10-CM

## 2014-12-13 DIAGNOSIS — I739 Peripheral vascular disease, unspecified: Secondary | ICD-10-CM

## 2014-12-13 DIAGNOSIS — Z01812 Encounter for preprocedural laboratory examination: Secondary | ICD-10-CM

## 2014-12-13 NOTE — Patient Instructions (Addendum)
Medication Instructions:  Your physician recommends that you continue on your current medications as directed. Please refer to the Current Medication list given to you today.   Labwork: BMET, CBC, PT/INR  Testing/Procedures: You physician has scheduled an abdominal aortogram  [facility]  Report to [place]. November 9 Arrival time: 8:00am NOTHING TO EAT OR DRINK AFTER MIDNIGHT THE EVENING BEFORE YOUR PROCEDURE You may take your medications with a sip of water. Do not take insulin.    Follow-Up: Your physician recommends that you schedule a follow-up appointment in: three weeks with Dr. Fletcher Anon.    Any Other Special Instructions Will Be Listed Below (If Applicable).     If you need a refill on your cardiac medications before your next appointment, please call your pharmacy.

## 2014-12-13 NOTE — Assessment & Plan Note (Signed)
The patient's symptoms are consistent with severe right calf claudication likely due to SFA disease (Rutherford class 3). ABI is probably falsely elevated due to calcified vessels. Nonetheless, there was monophasic waveform noted in the right popliteal artery. I discussed with the patient the natural history and management of peripheral arterial disease. I gave him the option of attempting medical therapy and an exercise program to improve walking distance. However, he reports that he has been not been able to do his physical activities and exercises since July including treadmill and elliptical because of his severe discomfort. Due to that, I recommended proceeding with abdominal aortogram, lower extremity angiography and possible endovascular intervention. I discussed risks and benefits with him.

## 2014-12-13 NOTE — Assessment & Plan Note (Signed)
He has no symptoms of angina. Continue medical therapy. 

## 2014-12-13 NOTE — Progress Notes (Signed)
Primary cardiologist: Dr. Haroldine Laws  HPI  This is a pleasant 69 year old male who was referred for evaluation of right calf claudication. He has known history of CAD S/P previous inferior wall myocardial infarction followed by CABG 1996. He had questionable TIA in March 2016. He quit smoking in 1996 after CABG. He has prolonged history of diabetes currently on insulin pump, hyperlipidemia and hypertension. He went on a trip to [place] in July. He drove there for about 6 hours. Shortly after, he had noticed right calf discomfort with walking. He underwent venous duplex which showed no evidence of DVT. Since then, he has experienced significant right calf discomfort with walking which happens after about 200 feet of distance. This typically forces him to rest for few minutes before he can resume again. He used to exercise on a treadmill and an elliptical on a regular basis. However, since then, he has not been able to do so. He underwent noninvasive vascular evaluation at the [facility]. ABI was 0.98 on the right and noncompressible on the left side. There was monophasicwaveform in the popliteal artery compared to triphasic waveform in the common femoral artery.   No Known Allergies   Current Outpatient Prescriptions on File Prior to Visit  Medication Sig Dispense Refill  . aspirin 162 MG EC tablet Take 162 mg by mouth daily.    Marland Kitchen atorvastatin (LIPITOR) 80 MG tablet Take 1 tablet (80 mg total) by mouth daily.    . insulin aspart (NOVOLOG) 100 unit/mL injection Inject into the skin continuous. Insulin Pump    . lansoprazole (PREVACID) 15 MG capsule Take by mouth.    Marland Kitchen lisinopril (PRINIVIL,ZESTRIL) 2.5 MG tablet Take 2.5 mg by mouth at bedtime.     . nitroGLYCERIN (NITROSTAT) 0.4 MG SL tablet USE AS DIRECTED    . omeprazole (PRILOSEC) 20 MG capsule Take 20 mg by mouth daily.      . sildenafil (VIAGRA) 100 MG tablet Take 100 mg by mouth daily as needed for erectile dysfunction.     Marland Kitchen zolpidem (AMBIEN)  10 MG tablet Take 10 mg by mouth at bedtime as needed for sleep.     No current facility-administered medications on file prior to visit.     Past Medical History  Diagnosis Date  . Diabetes mellitus     type 2  . Hypertension   . UNSPECIFIED PERIPHERAL VASCULAR DISEASE 10/06/2008  . HYPERTENSION, UNSPECIFIED 06/09/2008  . HYPERLIPIDEMIA-MIXED 06/09/2008  . DM 10/01/2008  . ERECTILE DYSFUNCTION 06/09/2008  . Hypotension   . Viral gastroenteritis     04/19/2010 improved  . Dyslipidemia   . GERD (gastroesophageal reflux disease)   . Weakness   . Heart attack ([place])   . PVD (peripheral vascular disease) ([place])     -- s/p right carotid endarterectomy by Dr. Drucie Opitz -- carotid u/s 0-39% (9/10) -- ABI L 0.95 R 1.0 (2/07)  . Erectile dysfunction   . CAD, ARTERY BYPASS GRAFT 06/09/2008     Past Surgical History  Procedure Laterality Date  . Refractive surgery      Status post cataract surgery.  . Carotid endarterectomy      Status post carotid endarterectomy of the right carotid.   . Coronary artery bypass graft  1996    --s/p previous inferior wall MI followed by bypass surgery in 1996.   --last cardiac cath Nov 2010 2008, which showed three-vessel CAD with patent grafts, EF was 50%. likely small area of ischemia in distal RCA and PL - medical  management --echo 11/09 EF 60-65%      Family History  Problem Relation Age of Onset  . Coronary artery disease Mother   . Stroke    . Coronary artery disease Father   . Diabetes Mother   . Diabetes Father      Social History   Social History  . Marital Status: Married    Spouse Name: N/A  . Number of Children: N/A  . Years of Education: N/A   Occupational History  . retired    Social History Main Topics  . Smoking status: Former Smoker    Quit date: 02/05/1994  . Smokeless tobacco: Never Used  . Alcohol Use: No     Comment: pt has a remote history of alcoholism / he quit on Halloween in 1982  . Drug Use: No  . Sexual  Activity: Not on file   Other Topics Concern  . Not on file   Social History Narrative   SOCIAL HISTORY:  The patient is married with children.  He is a nonsmoker.  He has a remote history of smoking, he quit in 1996.  He has a remote history of alcoholism.  He quit on Halloween in 1982.  He denies any illicit drug usage.                FAMILY HISTORY:  Positive for coronary artery disease and diabetic disease in both parents.      ROS A 10 point review of system was performed. It is negative other than that mentioned in the history of present illness.   PHYSICAL EXAM   BP 160/76 mmHg  Pulse 76  Ht 6' (1.829 m)  Wt 242 lb 8 oz (109.997 kg)  BMI 32.88 kg/m2 Constitutional: He is oriented to person, place, and time. He appears well-developed and well-nourished. No distress.  HENT: No nasal discharge.  Head: Normocephalic and atraumatic.  Eyes: Pupils are equal and round.  No discharge. Neck: Normal range of motion. Neck supple. No JVD present. No thyromegaly present.  Cardiovascular: Normal rate, regular rhythm, normal heart sounds. Exam reveals no gallop and no friction rub. No murmur heard.  Pulmonary/Chest: Effort normal and breath sounds normal. No stridor. No respiratory distress. He has no wheezes. He has no rales. He exhibits no tenderness.  Abdominal: Soft. Bowel sounds are normal. He exhibits no distension. There is no tenderness. There is no rebound and no guarding.  Musculoskeletal: Normal range of motion. He exhibits no edema and no tenderness.  Neurological: He is alert and oriented to person, place, and time. Coordination normal.  Skin: Skin is warm and dry. No rash noted. He is not diaphoretic. No erythema. No pallor.  Psychiatric: He has a normal mood and affect. His behavior is normal. Judgment and thought content normal.  Vascular: Femoral pulses are normal bilaterally. Distal pulses are not palpable.     YKD:XIPJAS sinus rhythm with possible old septal  infarct.   ASSESSMENT AND PLAN

## 2014-12-14 ENCOUNTER — Telehealth: Payer: Self-pay

## 2014-12-14 ENCOUNTER — Other Ambulatory Visit: Payer: Self-pay

## 2014-12-14 DIAGNOSIS — E875 Hyperkalemia: Secondary | ICD-10-CM

## 2014-12-14 LAB — BASIC METABOLIC PANEL
BUN / CREAT RATIO: 13 (ref 10–22)
BUN: 11 mg/dL (ref 8–27)
CALCIUM: 9.4 mg/dL (ref 8.6–10.2)
CO2: 19 mmol/L (ref 18–29)
Chloride: 106 mmol/L (ref 97–106)
Creatinine, Ser: 0.88 mg/dL (ref 0.76–1.27)
GFR, EST AFRICAN AMERICAN: 101 mL/min/{1.73_m2} (ref >59)
GFR, EST NON AFRICAN AMERICAN: 87 mL/min/{1.73_m2} (ref >59)
Glucose: 257 mg/dL — ABNORMAL HIGH (ref 65–99)
POTASSIUM: 5.8 mmol/L — AB (ref 3.5–5.2)
Sodium: 144 mmol/L (ref 136–144)

## 2014-12-14 LAB — CBC
HEMOGLOBIN: 15.1 g/dL (ref 12.6–17.7)
Hematocrit: 44.7 % (ref 37.5–51.0)
MCH: 31.5 pg (ref 26.6–33.0)
MCHC: 33.8 g/dL (ref 31.5–35.7)
MCV: 93 fL (ref 79–97)
Platelets: 232 10*3/uL (ref 150–379)
RBC: 4.8 x10E6/uL (ref 4.14–5.80)
RDW: 13.5 % (ref 12.3–15.4)
WBC: 9.1 10*3/uL (ref 3.4–10.8)

## 2014-12-14 LAB — PROTIME-INR
INR: 1.1 (ref 0.8–1.2)
Prothrombin Time: 11.3 s (ref 9.1–12.0)

## 2014-12-14 NOTE — Telephone Encounter (Signed)
Erroneous entry

## 2014-12-14 NOTE — Telephone Encounter (Signed)
Per verbal from Dr. Fletcher Anon, recheck serum potassium tomorrow morning before procedure at [facility]. Order placed. S/w pt of recommendations and reviewed instructions for abdominal aortogram. Pt verbalized understanding w/no further questions.

## 2014-12-14 NOTE — Telephone Encounter (Signed)
S/w pt of preliminary labs. Potassium 5.8.  Pt does not take potassium pill.  I reviewed potassium-rich foods with pt. Pt states he drinks tomato juice daily and had a glass before labs yesterday. Suggested he decrease intake of potassium-rich foods we reviewed. Pt agreeable w/plan. Will make Dr. Fletcher Anon aware and call pt back with further instructions.

## 2014-12-15 ENCOUNTER — Encounter (HOSPITAL_COMMUNITY)
Admission: RE | Disposition: A | Discharge status: Home / Self Care | Payer: Self-pay | Source: Ambulatory Visit | Attending: Cardiovascular Disease

## 2014-12-15 ENCOUNTER — Encounter (HOSPITAL_COMMUNITY): Payer: Self-pay | Admitting: General Practice

## 2014-12-15 ENCOUNTER — Other Ambulatory Visit: Payer: Self-pay | Admitting: Cardiovascular Disease

## 2014-12-15 ENCOUNTER — Observation Stay (HOSPITAL_COMMUNITY)
Admission: RE | Admit: 2014-12-15 | Discharge: 2014-12-16 | Disposition: A | Discharge status: Home / Self Care | Payer: Non-veteran care | Source: Ambulatory Visit | Attending: Cardiovascular Disease | Admitting: Cardiovascular Disease

## 2014-12-15 DIAGNOSIS — Z7982 Long term (current) use of aspirin: Secondary | ICD-10-CM | POA: Insufficient documentation

## 2014-12-15 DIAGNOSIS — Z9862 Peripheral vascular angioplasty status: Secondary | ICD-10-CM | POA: Insufficient documentation

## 2014-12-15 DIAGNOSIS — I2510 Atherosclerotic heart disease of native coronary artery without angina pectoris: Secondary | ICD-10-CM | POA: Diagnosis present

## 2014-12-15 DIAGNOSIS — I739 Peripheral vascular disease, unspecified: Secondary | ICD-10-CM | POA: Diagnosis present

## 2014-12-15 DIAGNOSIS — Z794 Long term (current) use of insulin: Secondary | ICD-10-CM | POA: Diagnosis not present

## 2014-12-15 DIAGNOSIS — E119 Type 2 diabetes mellitus without complications: Secondary | ICD-10-CM

## 2014-12-15 DIAGNOSIS — Z79899 Other long term (current) drug therapy: Secondary | ICD-10-CM | POA: Diagnosis not present

## 2014-12-15 DIAGNOSIS — Z951 Presence of aortocoronary bypass graft: Secondary | ICD-10-CM | POA: Insufficient documentation

## 2014-12-15 DIAGNOSIS — I700 Atherosclerosis of aorta: Secondary | ICD-10-CM | POA: Diagnosis not present

## 2014-12-15 DIAGNOSIS — K219 Gastro-esophageal reflux disease without esophagitis: Secondary | ICD-10-CM | POA: Insufficient documentation

## 2014-12-15 DIAGNOSIS — R42 Dizziness and giddiness: Secondary | ICD-10-CM

## 2014-12-15 DIAGNOSIS — I701 Atherosclerosis of renal artery: Secondary | ICD-10-CM | POA: Insufficient documentation

## 2014-12-15 DIAGNOSIS — I70213 Atherosclerosis of native arteries of extremities with intermittent claudication, bilateral legs: Secondary | ICD-10-CM | POA: Diagnosis not present

## 2014-12-15 DIAGNOSIS — IMO0001 Reserved for inherently not codable concepts without codable children: Secondary | ICD-10-CM

## 2014-12-15 DIAGNOSIS — F1021 Alcohol dependence, in remission: Secondary | ICD-10-CM | POA: Insufficient documentation

## 2014-12-15 DIAGNOSIS — Z87891 Personal history of nicotine dependence: Secondary | ICD-10-CM | POA: Diagnosis not present

## 2014-12-15 DIAGNOSIS — E785 Hyperlipidemia, unspecified: Secondary | ICD-10-CM | POA: Diagnosis not present

## 2014-12-15 DIAGNOSIS — E118 Type 2 diabetes mellitus with unspecified complications: Secondary | ICD-10-CM

## 2014-12-15 DIAGNOSIS — I70211 Atherosclerosis of native arteries of extremities with intermittent claudication, right leg: Secondary | ICD-10-CM | POA: Diagnosis not present

## 2014-12-15 DIAGNOSIS — I10 Essential (primary) hypertension: Secondary | ICD-10-CM | POA: Diagnosis not present

## 2014-12-15 DIAGNOSIS — I252 Old myocardial infarction: Secondary | ICD-10-CM | POA: Insufficient documentation

## 2014-12-15 DIAGNOSIS — I951 Orthostatic hypotension: Secondary | ICD-10-CM

## 2014-12-15 HISTORY — DX: Type 1 diabetes mellitus without complications: E10.9

## 2014-12-15 HISTORY — PX: PERIPHERAL VASCULAR CATHETERIZATION: SHX172C

## 2014-12-15 HISTORY — DX: Orthostatic hypotension: I95.1

## 2014-12-15 LAB — POCT ACTIVATED CLOTTING TIME
ACTIVATED CLOTTING TIME: 270 s
ACTIVATED CLOTTING TIME: 202 s
Activated Clotting Time: 226 seconds
Activated Clotting Time: 245 seconds
Activated Clotting Time: 177 seconds
Activated Clotting Time: 183 seconds

## 2014-12-15 LAB — POCT I-STAT, CHEM 8
BUN: 11 mg/dL (ref 6–20)
BUN: 10 mg/dL (ref 6–20)
CHLORIDE: 100 mmol/L — AB (ref 101–111)
CREATININE: 0.7 mg/dL (ref 0.61–1.24)
CREATININE: 0.7 mg/dL (ref 0.61–1.24)
Calcium, Ion: 1.17 mmol/L (ref 1.13–1.30)
Calcium, Ion: 1.17 mmol/L (ref 1.13–1.30)
Chloride: 105 mmol/L (ref 101–111)
GLUCOSE: 125 mg/dL — AB (ref 65–99)
GLUCOSE: 154 mg/dL — AB (ref 65–99)
HEMATOCRIT: 44 % (ref 39.0–52.0)
HEMATOCRIT: 42 % (ref 39.0–52.0)
HEMOGLOBIN: 15 g/dL (ref 13.0–17.0)
Hemoglobin: 14.3 g/dL (ref 13.0–17.0)
POTASSIUM: 4.2 mmol/L (ref 3.5–5.1)
Potassium: 3.9 mmol/L (ref 3.5–5.1)
Sodium: 142 mmol/L (ref 135–145)
Sodium: 136 mmol/L (ref 135–145)
TCO2: 22 mmol/L (ref 0–100)
TCO2: 21 mmol/L (ref 0–100)

## 2014-12-15 LAB — GLUCOSE, CAPILLARY
GLUCOSE-CAPILLARY: 85 mg/dL (ref 65–99)
GLUCOSE-CAPILLARY: 107 mg/dL — AB (ref 65–99)
GLUCOSE-CAPILLARY: 67 mg/dL (ref 65–99)
GLUCOSE-CAPILLARY: 261 mg/dL — AB (ref 65–99)
GLUCOSE-CAPILLARY: 210 mg/dL — AB (ref 65–99)
GLUCOSE-CAPILLARY: 266 mg/dL — AB (ref 65–99)
Glucose-Capillary: 167 mg/dL — ABNORMAL HIGH (ref 65–99)
Glucose-Capillary: 216 mg/dL — ABNORMAL HIGH (ref 65–99)

## 2014-12-15 LAB — POTASSIUM: Potassium: 4.2 mmol/L (ref 3.5–5.1)

## 2014-12-15 SURGERY — ABDOMINAL AORTOGRAM

## 2014-12-15 MED ORDER — SODIUM CHLORIDE 0.9 % IJ SOLN: 3.0000 mL | Freq: Two times a day (BID) | INTRAMUSCULAR | Status: DC

## 2014-12-15 MED ORDER — MIDAZOLAM HCL 2 MG/2ML IJ SOLN
INTRAMUSCULAR | Status: AC
  Filled 2014-12-15: qty 4

## 2014-12-15 MED ORDER — ASPIRIN 81 MG PO CHEW
81.0000 mg | CHEWABLE_TABLET | ORAL | Status: AC
  Administered 2014-12-15: 81 mg via ORAL

## 2014-12-15 MED ORDER — CLOPIDOGREL BISULFATE 300 MG PO TABS
ORAL_TABLET | ORAL | Status: DC | PRN
  Administered 2014-12-15: 600 mg via ORAL

## 2014-12-15 MED ORDER — FENTANYL CITRATE (PF) 100 MCG/2ML IJ SOLN
INTRAMUSCULAR | Status: AC
  Filled 2014-12-15: qty 4

## 2014-12-15 MED ORDER — SODIUM CHLORIDE 0.9 % IV SOLN: INTRAVENOUS | Status: AC

## 2014-12-15 MED ORDER — CLOPIDOGREL BISULFATE 300 MG PO TABS
ORAL_TABLET | ORAL | Status: AC
  Filled 2014-12-15: qty 2

## 2014-12-15 MED ORDER — SODIUM CHLORIDE 0.9 % IJ SOLN: 3.0000 mL | INTRAMUSCULAR | Status: DC | PRN

## 2014-12-15 MED ORDER — DEXTROSE 50 % IV SOLN: 25.0000 mL | INTRAVENOUS | Status: AC

## 2014-12-15 MED ORDER — CLOPIDOGREL BISULFATE 75 MG PO TABS: 75.0000 mg | ORAL_TABLET | Freq: Every day | ORAL | Status: DC

## 2014-12-15 MED ORDER — FENTANYL CITRATE (PF) 100 MCG/2ML IJ SOLN
INTRAMUSCULAR | Status: DC | PRN
  Administered 2014-12-15 (×2): 50 ug via INTRAVENOUS

## 2014-12-15 MED ORDER — CLOPIDOGREL BISULFATE 75 MG PO TABS
75.0000 mg | ORAL_TABLET | Freq: Every day | ORAL | Status: DC
  Administered 2014-12-16: 75 mg via ORAL
  Filled 2014-12-15: qty 1

## 2014-12-15 MED ORDER — ATORVASTATIN CALCIUM 80 MG PO TABS
80.0000 mg | ORAL_TABLET | Freq: Every day | ORAL | Status: DC
  Filled 2014-12-15: qty 1

## 2014-12-15 MED ORDER — ZOLPIDEM TARTRATE 5 MG PO TABS
5.0000 mg | ORAL_TABLET | Freq: Every evening | ORAL | Status: DC | PRN
  Filled 2014-12-15: qty 1

## 2014-12-15 MED ORDER — ASPIRIN 81 MG PO CHEW
CHEWABLE_TABLET | ORAL | Status: AC
  Filled 2014-12-15: qty 1

## 2014-12-15 MED ORDER — MIDAZOLAM HCL 2 MG/2ML IJ SOLN
INTRAMUSCULAR | Status: DC | PRN
  Administered 2014-12-15 (×2): 1 mg via INTRAVENOUS

## 2014-12-15 MED ORDER — MIDAZOLAM HCL 2 MG/2ML IJ SOLN
INTRAMUSCULAR | Status: DC | PRN
  Administered 2014-12-15: 1 mg via INTRAVENOUS

## 2014-12-15 MED ORDER — LIDOCAINE HCL (PF) 1 % IJ SOLN
INTRAMUSCULAR | Status: AC
  Filled 2014-12-15: qty 30

## 2014-12-15 MED ORDER — SODIUM CHLORIDE 0.9 % IV SOLN: 250.0000 mL | INTRAVENOUS | Status: DC | PRN

## 2014-12-15 MED ORDER — HEPARIN (PORCINE) IN NACL 2-0.9 UNIT/ML-% IJ SOLN
INTRAMUSCULAR | Status: AC
  Filled 2014-12-15: qty 1000

## 2014-12-15 MED ORDER — ASPIRIN EC 81 MG PO TBEC
162.0000 mg | DELAYED_RELEASE_TABLET | Freq: Every day | ORAL | Status: DC
  Filled 2014-12-15: qty 2

## 2014-12-15 MED ORDER — LIDOCAINE HCL (PF) 1 % IJ SOLN
INTRAMUSCULAR | Status: DC | PRN
  Administered 2014-12-15: 11:00:00

## 2014-12-15 MED ORDER — IODIXANOL 320 MG/ML IV SOLN
INTRAVENOUS | Status: DC | PRN
  Administered 2014-12-15: 210 mL via INTRA_ARTERIAL

## 2014-12-15 MED ORDER — DEXTROSE 50 % IV SOLN
INTRAVENOUS | Status: AC
Start: 2014-12-15 — End: 2014-12-15
  Administered 2014-12-15: 25 mL
  Filled 2014-12-15: qty 50

## 2014-12-15 MED ORDER — FENTANYL CITRATE (PF) 100 MCG/2ML IJ SOLN
INTRAMUSCULAR | Status: DC | PRN
  Administered 2014-12-15: 50 ug via INTRAVENOUS

## 2014-12-15 MED ORDER — SODIUM CHLORIDE 0.9 % IV SOLN
INTRAVENOUS | Status: DC
  Administered 2014-12-15: 08:00:00 via INTRAVENOUS

## 2014-12-15 MED ORDER — SODIUM CHLORIDE 0.9 % IV BOLUS (SEPSIS): 500.0000 mL | Freq: Once | INTRAVENOUS | Status: DC

## 2014-12-15 MED ORDER — HEPARIN SODIUM (PORCINE) 1000 UNIT/ML IJ SOLN
INTRAMUSCULAR | Status: DC | PRN
  Administered 2014-12-15: 2000 [IU] via INTRAVENOUS
  Administered 2014-12-15: 8000 [IU] via INTRAVENOUS

## 2014-12-15 SURGICAL SUPPLY — 25 items
BALLN LUTONIX DCB 6X80X130 (BALLOONS) ×2
BALLN MUSTANG 5X60X135 (BALLOONS) ×2
BALLOON LUTONIX DCB 6X80X130 (BALLOONS) ×1 IMPLANT
BALLOON MUSTANG 5X60X135 (BALLOONS) ×1 IMPLANT
CATH ANGIO 5F PIGTAIL 65CM (CATHETERS) ×2 IMPLANT
CATH CROSS OVER TEMPO 5F (CATHETERS) ×2 IMPLANT
CATH NAVICROSS ANGLED 135CM (MICROCATHETER) ×2 IMPLANT
CATH QUICKCROSS .018X135CM (MICROCATHETER) ×2 IMPLANT
CATH STRAIGHT 5FR 65CM (CATHETERS) ×2 IMPLANT
GUIDEWIRE ANGLED .035X260CM (WIRE) ×2 IMPLANT
KIT ENCORE 26 ADVANTAGE (KITS) ×2 IMPLANT
KIT PV (KITS) ×2 IMPLANT
SHEATH PINNACLE 5F 10CM (SHEATH) ×2 IMPLANT
SHEATH PINNACLE ST 6F 45CM (SHEATH) ×2 IMPLANT
STENT INNOVA 7X80X130 (Permanent Stent) ×2 IMPLANT
STOPCOCK MORSE 400PSI 3WAY (MISCELLANEOUS) ×2 IMPLANT
SYR MEDRAD MARK V 150ML (SYRINGE) ×2 IMPLANT
TAPE RADIOPAQUE TURBO (MISCELLANEOUS) ×2 IMPLANT
TRANSDUCER W/STOPCOCK (MISCELLANEOUS) ×2 IMPLANT
TRAY PV CATH (CUSTOM PROCEDURE TRAY) ×2 IMPLANT
WIRE ASAHI FIELDER XT 300CM (WIRE) ×2 IMPLANT
WIRE HITORQ VERSACORE ST 145CM (WIRE) ×2 IMPLANT
WIRE RUNTHROUGH .014X300CM (WIRE) ×2 IMPLANT
WIRE TREASURE-12 .018X300CM (WIRE) ×4 IMPLANT
WIRE VERSACORE LOC 115CM (WIRE) ×2 IMPLANT

## 2014-12-15 NOTE — H&P (Signed)
HPI: This is a pleasant 69 year old male who was referred for evaluation of right calf claudication. He has known history of CAD S/P previous inferior wall myocardial infarction followed by CABG 1996. He had questionable TIA in March 2016. He quit smoking in 1996 after CABG. He has prolonged history of diabetes currently on insulin pump, hyperlipidemia and hypertension. He went on a trip to [place] in July. He drove there for about 6 hours. Shortly after, he had noticed right calf discomfort with walking. He underwent venous duplex which showed no evidence of DVT. Since then, he has experienced significant right calf discomfort with walking which happens after about 200 feet of distance. This typically forces him to rest for few minutes before he can resume again. He used to exercise on a treadmill and an elliptical on a regular basis. However, since then, he has not been able to do so. He underwent noninvasive vascular evaluation at the [facility]. ABI was 0.98 on the right and noncompressible on the left side. There was monophasic waveform in the popliteal artery compared to triphasic waveform in the common femoral artery.    He presented today for peripheral catheterization via left CFA access.  He was noted to have an occluded proximal right tibial artery with reconstitution above the knee and underwent successful angioplasty and self-expanding stent placement to the right popliteal artery.  He was monitored post procedure with plans to discharge home once bed rest complete.  However, I was paged by the RN to inform of some bouts of hypotension upon standing him that resolved upon laying him back down.  He reports getting lightheaded and 1 episode of mild nausea with this.  His RN reports that his SBP dropped into the 70s with this.  Currently laying in bed and feels well.    Review of Systems:     Cardiac Review of Systems: {Y] = yes [ ]  = no  Chest Pain [    ]  Resting SOB [   ] Exertional SOB  [   ]  Orthopnea [  ]   Pedal Edema [   ]    Palpitations [  ] Syncope  [  ]   Presyncope [   ]  General Review of Systems: [Y] = yes [  ]=no Constitional: recent weight change [  ]; anorexia [  ]; fatigue [  ]; nausea [  ]; night sweats [  ]; fever [  ]; or chills [  ];                                                                      Dental: poor dentition[  ];   Eye : blurred vision [  ]; diplopia [   ]; vision changes [  ];  Amaurosis fugax[  ]; Resp: cough [  ];  wheezing[  ];  hemoptysis[  ]; shortness of breath[  ]; paroxysmal nocturnal dyspnea[  ]; dyspnea on exertion[  ]; or orthopnea[  ];  GI:  gallstones[  ], vomiting[  ];  dysphagia[  ]; melena[  ];  hematochezia [  ]; heartburn[  ];   GU: kidney stones [  ]; hematuria[  ];   dysuria [  ];  nocturia[  ];               Skin: rash [  ], swelling[  ];, hair loss[  ];  peripheral edema[  ];  or itching[  ]; Musculosketetal: myalgias[  ];  joint swelling[  ];  joint erythema[  ];  joint pain[  ];  back pain[  ];  Heme/Lymph: bruising[  ];  bleeding[  ];  anemia[  ];  Neuro: TIA[  ];  headaches[  ];  stroke[  ];  vertigo[  ];  seizures[  ];   paresthesias[  ];  difficulty walking[  ];  Psych:depression[  ]; anxiety[  ];  Endocrine: diabetes[  ];  thyroid dysfunction[  ];  Other:  Past Medical History  Diagnosis Date  . Diabetes mellitus     type 2  . Hypertension   . UNSPECIFIED PERIPHERAL VASCULAR DISEASE 10/06/2008  . HYPERTENSION, UNSPECIFIED 06/09/2008  . HYPERLIPIDEMIA-MIXED 06/09/2008  . DM 10/01/2008  . ERECTILE DYSFUNCTION 06/09/2008  . Hypotension   . Viral gastroenteritis     04/19/2010 improved  . Dyslipidemia   . GERD (gastroesophageal reflux disease)   . Weakness   . Heart attack ([place])   . PVD (peripheral vascular disease) ([place])     -- s/p right carotid endarterectomy by Dr. Drucie Opitz -- carotid u/s 0-39% (9/10) -- ABI L 0.95 R 1.0 (2/07)  . Erectile dysfunction   . CAD, ARTERY BYPASS GRAFT 06/09/2008    No current  facility-administered medications on file prior to encounter.   Current Outpatient Prescriptions on File Prior to Encounter  Medication Sig Dispense Refill  . aspirin 162 MG EC tablet Take 162 mg by mouth daily.    Marland Kitchen atorvastatin (LIPITOR) 80 MG tablet Take 1 tablet (80 mg total) by mouth daily.    . insulin aspart (NOVOLOG) 100 unit/mL injection Inject into the skin continuous. Insulin Pump    . lisinopril (PRINIVIL,ZESTRIL) 2.5 MG tablet Take 2.5 mg by mouth at bedtime.     . nitroGLYCERIN (NITROSTAT) 0.4 MG SL tablet USE AS DIRECTED    . omeprazole (PRILOSEC) 20 MG capsule Take 20 mg by mouth daily.      . sildenafil (VIAGRA) 100 MG tablet Take 100 mg by mouth daily as needed for erectile dysfunction.     Marland Kitchen zolpidem (AMBIEN) 10 MG tablet Take 10 mg by mouth at bedtime as needed for sleep.       No Known Allergies  Social History   Social History  . Marital Status: Married    Spouse Name: N/A  . Number of Children: N/A  . Years of Education: N/A   Occupational History  . retired    Social History Main Topics  . Smoking status: Former Smoker    Quit date: 02/05/1994  . Smokeless tobacco: Never Used  . Alcohol Use: No     Comment: pt has a remote history of alcoholism / he quit on Halloween in 1982  . Drug Use: No  . Sexual Activity: Not on file   Other Topics Concern  . Not on file   Social History Narrative   SOCIAL HISTORY:  The patient is married with children.  He is a nonsmoker.  He has a remote history of smoking, he quit in 1996.  He has a remote history of alcoholism.  He quit on Halloween in 1982.  He denies any illicit drug usage.  FAMILY HISTORY:  Positive for coronary artery disease and diabetic disease in both parents.     Family History  Problem Relation Age of Onset  . Coronary artery disease Mother   . Stroke    . Coronary artery disease Father   . Diabetes Mother   . Diabetes Father     PHYSICAL EXAM: Filed Vitals:   12/15/14  2130  BP: 121/50  Pulse: 77  Temp:   Resp:    General:  Well appearing. No respiratory difficulty HEENT: normal Neck: supple. no JVD. Carotids 2+ bilat; no bruits. No lymphadenopathy or thryomegaly appreciated. Cor: PMI nondisplaced. Regular rate & rhythm. No rubs, gallops or murmurs. Lungs: clear Abdomen: soft, nontender, nondistended. No hepatosplenomegaly. No bruits or masses. Good bowel sounds. Extremities: no cyanosis, clubbing, rash, edema.  Left groin with bandage.  Area of ecchymosis noted but no tenderness, induration or bruit noted.  Neuro: alert & oriented x 3, cranial nerves grossly intact. moves all 4 extremities w/o difficulty. Affect pleasant.   Results for orders placed or performed during the hospital encounter of 12/15/14 (from the past 24 hour(s))  Glucose, capillary     Status: None   Collection Time: 12/15/14  8:09 AM  Result Value Ref Range   Glucose-Capillary 85 65 - 99 mg/dL  Potassium     Status: None   Collection Time: 12/15/14  8:29 AM  Result Value Ref Range   Potassium 4.2 3.5 - 5.1 mmol/L  Glucose, capillary     Status: None   Collection Time: 12/15/14  8:57 AM  Result Value Ref Range   Glucose-Capillary 67 65 - 99 mg/dL  Glucose, capillary     Status: Abnormal   Collection Time: 12/15/14  9:25 AM  Result Value Ref Range   Glucose-Capillary 107 (H) 65 - 99 mg/dL  I-STAT, chem 8     Status: Abnormal   Collection Time: 12/15/14 10:20 AM  Result Value Ref Range   Sodium 142 135 - 145 mmol/L   Potassium 3.9 3.5 - 5.1 mmol/L   Chloride 105 101 - 111 mmol/L   BUN 11 6 - 20 mg/dL   Creatinine, Ser 0.70 0.61 - 1.24 mg/dL   Glucose, Bld 125 (H) 65 - 99 mg/dL   Calcium, Ion 1.17 1.13 - 1.30 mmol/L   TCO2 22 0 - 100 mmol/L   Hemoglobin 15.0 13.0 - 17.0 g/dL   HCT 44.0 39.0 - 52.0 %  POCT Activated clotting time     Status: None   Collection Time: 12/15/14 10:46 AM  Result Value Ref Range   Activated Clotting Time 270 seconds  POCT Activated  clotting time     Status: None   Collection Time: 12/15/14 11:16 AM  Result Value Ref Range   Activated Clotting Time 226 seconds  I-STAT, chem 8     Status: Abnormal   Collection Time: 12/15/14 11:49 AM  Result Value Ref Range   Sodium 136 135 - 145 mmol/L   Potassium 4.2 3.5 - 5.1 mmol/L   Chloride 100 (L) 101 - 111 mmol/L   BUN 10 6 - 20 mg/dL   Creatinine, Ser 0.70 0.61 - 1.24 mg/dL   Glucose, Bld 154 (H) 65 - 99 mg/dL   Calcium, Ion 1.17 1.13 - 1.30 mmol/L   TCO2 21 0 - 100 mmol/L   Hemoglobin 14.3 13.0 - 17.0 g/dL   HCT 42.0 39.0 - 52.0 %  POCT Activated clotting time     Status: None   Collection  Time: 12/15/14 12:05 PM  Result Value Ref Range   Activated Clotting Time 245 seconds  Glucose, capillary     Status: Abnormal   Collection Time: 12/15/14 12:42 PM  Result Value Ref Range   Glucose-Capillary 167 (H) 65 - 99 mg/dL  POCT Activated clotting time     Status: None   Collection Time: 12/15/14  1:11 PM  Result Value Ref Range   Activated Clotting Time 202 seconds  POCT Activated clotting time     Status: None   Collection Time: 12/15/14  1:56 PM  Result Value Ref Range   Activated Clotting Time 183 seconds  Glucose, capillary     Status: Abnormal   Collection Time: 12/15/14  2:26 PM  Result Value Ref Range   Glucose-Capillary 261 (H) 65 - 99 mg/dL  POCT Activated clotting time     Status: None   Collection Time: 12/15/14  2:27 PM  Result Value Ref Range   Activated Clotting Time 177 seconds  Glucose, capillary     Status: Abnormal   Collection Time: 12/15/14  3:47 PM  Result Value Ref Range   Glucose-Capillary 216 (H) 65 - 99 mg/dL  Glucose, capillary     Status: Abnormal   Collection Time: 12/15/14  8:31 PM  Result Value Ref Range   Glucose-Capillary 210 (H) 65 - 99 mg/dL   No results found.   ASSESSMENT: 69 yo man with h/o HTN, HLD, DM, CAD s/p CABG, erectile dysfunction and claudication s/p angioplasty and self-expanding stent placement to the right  popliteal artery now with orthostatic hypotension.  Main concern would be access site complication, however, I do not see any evidence of this on exam.  I suspect there could be a component of dehydration given NPO status prior to procedure in addition to medications (ACE-i).  He has a h/o DM on insulin raising the possibility of some neuropathy with autonomic dysfunction as well.  He has prn NTG on his home med list as well as sildenafil but reports never taking NTG.  PLAN/DISCUSSION: Admit under observation Saline bolus Check CBC to ensure stable Hg, will consider imaging if significant decrease Hold lisinopril Recheck orthostatic vital signs Further work up/management pending above.

## 2014-12-15 NOTE — Interval H&P Note (Signed)
History and Physical Interval Note:  12/15/2014 2:12 PM  Derek Hogan  has presented today for surgery, with the diagnosis of pad  The various methods of treatment have been discussed with the patient and family. After consideration of risks, benefits and other options for treatment, the patient has consented to  Procedure(s): Abdominal Aortogram (N/A) as a surgical intervention .  The patient's history has been reviewed, patient examined, no change in status, stable for surgery.  I have reviewed the patient's chart and labs.  Questions were answered to the patient's satisfaction.     Kathlyn Sacramento

## 2014-12-15 NOTE — H&P (View-Only) (Signed)
Primary cardiologist: Dr. Haroldine Laws  HPI  This is a pleasant 69 year old male who was referred for evaluation of right calf claudication. He has known history of CAD S/P previous inferior wall myocardial infarction followed by CABG 1996. He had questionable TIA in March 2016. He quit smoking in 1996 after CABG. He has prolonged history of diabetes currently on insulin pump, hyperlipidemia and hypertension. He went on a trip to [place] in July. He drove there for about 6 hours. Shortly after, he had noticed right calf discomfort with walking. He underwent venous duplex which showed no evidence of DVT. Since then, he has experienced significant right calf discomfort with walking which happens after about 200 feet of distance. This typically forces him to rest for few minutes before he can resume again. He used to exercise on a treadmill and an elliptical on a regular basis. However, since then, he has not been able to do so. He underwent noninvasive vascular evaluation at the [facility]. ABI was 0.98 on the right and noncompressible on the left side. There was monophasicwaveform in the popliteal artery compared to triphasic waveform in the common femoral artery.   No Known Allergies   Current Outpatient Prescriptions on File Prior to Visit  Medication Sig Dispense Refill  . aspirin 162 MG EC tablet Take 162 mg by mouth daily.    Marland Kitchen atorvastatin (LIPITOR) 80 MG tablet Take 1 tablet (80 mg total) by mouth daily.    . insulin aspart (NOVOLOG) 100 unit/mL injection Inject into the skin continuous. Insulin Pump    . lansoprazole (PREVACID) 15 MG capsule Take by mouth.    Marland Kitchen lisinopril (PRINIVIL,ZESTRIL) 2.5 MG tablet Take 2.5 mg by mouth at bedtime.     . nitroGLYCERIN (NITROSTAT) 0.4 MG SL tablet USE AS DIRECTED    . omeprazole (PRILOSEC) 20 MG capsule Take 20 mg by mouth daily.      . sildenafil (VIAGRA) 100 MG tablet Take 100 mg by mouth daily as needed for erectile dysfunction.     Marland Kitchen zolpidem (AMBIEN)  10 MG tablet Take 10 mg by mouth at bedtime as needed for sleep.     No current facility-administered medications on file prior to visit.     Past Medical History  Diagnosis Date  . Diabetes mellitus     type 2  . Hypertension   . UNSPECIFIED PERIPHERAL VASCULAR DISEASE 10/06/2008  . HYPERTENSION, UNSPECIFIED 06/09/2008  . HYPERLIPIDEMIA-MIXED 06/09/2008  . DM 10/01/2008  . ERECTILE DYSFUNCTION 06/09/2008  . Hypotension   . Viral gastroenteritis     04/19/2010 improved  . Dyslipidemia   . GERD (gastroesophageal reflux disease)   . Weakness   . Heart attack ([place])   . PVD (peripheral vascular disease) ([place])     -- s/p right carotid endarterectomy by Dr. Drucie Opitz -- carotid u/s 0-39% (9/10) -- ABI L 0.95 R 1.0 (2/07)  . Erectile dysfunction   . CAD, ARTERY BYPASS GRAFT 06/09/2008     Past Surgical History  Procedure Laterality Date  . Refractive surgery      Status post cataract surgery.  . Carotid endarterectomy      Status post carotid endarterectomy of the right carotid.   . Coronary artery bypass graft  1996    --s/p previous inferior wall MI followed by bypass surgery in 1996.   --last cardiac cath Nov 2010 2008, which showed three-vessel CAD with patent grafts, EF was 50%. likely small area of ischemia in distal RCA and PL - medical  management --echo 11/09 EF 60-65%      Family History  Problem Relation Age of Onset  . Coronary artery disease Mother   . Stroke    . Coronary artery disease Father   . Diabetes Mother   . Diabetes Father      Social History   Social History  . Marital Status: Married    Spouse Name: N/A  . Number of Children: N/A  . Years of Education: N/A   Occupational History  . retired    Social History Main Topics  . Smoking status: Former Smoker    Quit date: 02/05/1994  . Smokeless tobacco: Never Used  . Alcohol Use: No     Comment: pt has a remote history of alcoholism / he quit on Halloween in 1982  . Drug Use: No  . Sexual  Activity: Not on file   Other Topics Concern  . Not on file   Social History Narrative   SOCIAL HISTORY:  The patient is married with children.  He is a nonsmoker.  He has a remote history of smoking, he quit in 1996.  He has a remote history of alcoholism.  He quit on Halloween in 1982.  He denies any illicit drug usage.                FAMILY HISTORY:  Positive for coronary artery disease and diabetic disease in both parents.      ROS A 10 point review of system was performed. It is negative other than that mentioned in the history of present illness.   PHYSICAL EXAM   BP 160/76 mmHg  Pulse 76  Ht 6' (1.829 m)  Wt 242 lb 8 oz (109.997 kg)  BMI 32.88 kg/m2 Constitutional: He is oriented to person, place, and time. He appears well-developed and well-nourished. No distress.  HENT: No nasal discharge.  Head: Normocephalic and atraumatic.  Eyes: Pupils are equal and round.  No discharge. Neck: Normal range of motion. Neck supple. No JVD present. No thyromegaly present.  Cardiovascular: Normal rate, regular rhythm, normal heart sounds. Exam reveals no gallop and no friction rub. No murmur heard.  Pulmonary/Chest: Effort normal and breath sounds normal. No stridor. No respiratory distress. He has no wheezes. He has no rales. He exhibits no tenderness.  Abdominal: Soft. Bowel sounds are normal. He exhibits no distension. There is no tenderness. There is no rebound and no guarding.  Musculoskeletal: Normal range of motion. He exhibits no edema and no tenderness.  Neurological: He is alert and oriented to person, place, and time. Coordination normal.  Skin: Skin is warm and dry. No rash noted. He is not diaphoretic. No erythema. No pallor.  Psychiatric: He has a normal mood and affect. His behavior is normal. Judgment and thought content normal.  Vascular: Femoral pulses are normal bilaterally. Distal pulses are not palpable.     UTM:LYYTKP sinus rhythm with possible old septal  infarct.   ASSESSMENT AND PLAN

## 2014-12-15 NOTE — Discharge Instructions (Signed)
Start taking Plavix 75 mg once daily. A prescription was sent to [facility].  Angiogram, Care After Refer to this sheet in the next few weeks. These instructions provide you with information about caring for yourself after your procedure. Your health care provider may also give you more specific instructions. Your treatment has been planned according to current medical practices, but problems sometimes occur. Call your health care provider if you have any problems or questions after your procedure. WHAT TO EXPECT AFTER THE PROCEDURE After your procedure, it is typical to have the following:  Bruising at the catheter insertion site that usually fades within 1-2 weeks.  Blood collecting in the tissue (hematoma) that may be painful to the touch. It should usually decrease in size and tenderness within 1-2 weeks. HOME CARE INSTRUCTIONS  Take medicines only as directed by your health care provider.  You may shower 24-48 hours after the procedure or as directed by your health care provider. Remove the bandage (dressing) and gently wash the site with plain soap and water. Pat the area dry with a clean towel. Do not rub the site, because this may cause bleeding.  Do not take baths, swim, or use a hot tub until your health care provider approves.  Check your insertion site every day for redness, swelling, or drainage.  Do not apply powder or lotion to the site.  Do not lift over 10 lb (4.5 kg) for 5 days after your procedure or as directed by your health care provider.  Ask your health care provider when it is okay to:  Return to work or school.  Resume usual physical activities or sports.  Resume sexual activity.  Do not drive home if you are discharged the same day as the procedure. Have someone else drive you.  You may drive 24 hours after the procedure unless otherwise instructed by your health care provider.  Do not operate machinery or power tools for 24 hours after the  procedure or as directed by your health care provider.  If your procedure was done as an outpatient procedure, which means that you went home the same day as your procedure, a responsible adult should be with you for the first 24 hours after you arrive home.  Keep all follow-up visits as directed by your health care provider. This is important. SEEK MEDICAL CARE IF:  You have a fever.  You have chills.  You have increased bleeding from the catheter insertion site. Hold pressure on the site. SEEK IMMEDIATE MEDICAL CARE IF:  You have unusual pain at the catheter insertion site.  You have redness, warmth, or swelling at the catheter insertion site.  You have drainage (other than a small amount of blood on the dressing) from the catheter insertion site.  The catheter insertion site is bleeding, and the bleeding does not stop after 30 minutes of holding steady pressure on the site.  The area near or just beyond the catheter insertion site becomes pale, cool, tingly, or numb.   This information is not intended to replace advice given to you by your health care provider. Make sure you discuss any questions you have with your health care provider.   Document Released: 08/10/2004 Document Revised: 02/12/2014 Document Reviewed: 06/25/2012 Elsevier Interactive Patient Education [employer].

## 2014-12-15 NOTE — Progress Notes (Addendum)
Pt felt dizzy when I stood him up. Pt was laid back down. We tried again and he was dizzy and diaphoretic, he was laid down again. Glucose and bp checked. bp 106/59, rbs 206, his usual bp was 140s/74. Laid down again and waited longer. Sat up on edge of bed and felt dizzy again 76/45 p 78 spo2 96. Placed in trendelenburg and recovered within 5 minutes. bp returned to 122//66, p 88. Raised him slowly in stages,  126/61 p 79. 124/68 p 84, 118//66  P81, 101/56 p 78, t was laid back down due to dizziness and nausea which went away after laying him down, On call cardiologist was paged. Orders followed/ pt to be admitted. Ns 500 ml bolus. Pt has had a total off 2 liters today. Voided 3 times total of 1300 cc pale yellow. Pt is resistant to staying over and is currently laying on his left side and is now stating he feels better. He wants to try to get up again.

## 2014-12-15 NOTE — Progress Notes (Signed)
Patient arrived to cath lab holding. 86F sheath in Lt femoral artery. Lt Femoral site soft with no active bleeding and or hematoma noted. VSS. Patient states no pain at this time. Patient resting comfortably. Will continue to monitor patient.

## 2014-12-15 NOTE — Progress Notes (Signed)
CBG 167 Report given to Laura,RN by [facility]

## 2014-12-15 NOTE — Progress Notes (Signed)
Site area: left groin a 6 french arterial sheath was removed  Site Prior to Removal:  Level 0  Pressure Applied For 30 MINUTES    Minutes Beginning at 1435p  Manual:   Yes.    Patient Status During Pull:  stable  Post Pull Groin Site:  Level 0  Post Pull Instructions Given:  Yes.    Post Pull Pulses Present:  Yes.    Dressing Applied:  Yes.    Comments:  VS remain stable during sheath pull

## 2014-12-16 ENCOUNTER — Encounter (HOSPITAL_COMMUNITY): Payer: Self-pay | Admitting: Cardiovascular Disease

## 2014-12-16 DIAGNOSIS — I739 Peripheral vascular disease, unspecified: Secondary | ICD-10-CM | POA: Diagnosis not present

## 2014-12-16 DIAGNOSIS — I2510 Atherosclerotic heart disease of native coronary artery without angina pectoris: Secondary | ICD-10-CM | POA: Diagnosis not present

## 2014-12-16 DIAGNOSIS — I70213 Atherosclerosis of native arteries of extremities with intermittent claudication, bilateral legs: Secondary | ICD-10-CM | POA: Diagnosis not present

## 2014-12-16 DIAGNOSIS — Z9862 Peripheral vascular angioplasty status: Secondary | ICD-10-CM | POA: Insufficient documentation

## 2014-12-16 DIAGNOSIS — I951 Orthostatic hypotension: Secondary | ICD-10-CM | POA: Diagnosis not present

## 2014-12-16 DIAGNOSIS — R42 Dizziness and giddiness: Secondary | ICD-10-CM

## 2014-12-16 LAB — CBC
HEMATOCRIT: 40.8 % (ref 39.0–52.0)
HEMOGLOBIN: 13.8 g/dL (ref 13.0–17.0)
MCH: 31.6 pg (ref 26.0–34.0)
MCHC: 33.8 g/dL (ref 30.0–36.0)
MCV: 93.4 fL (ref 78.0–100.0)
Platelets: 188 10*3/uL (ref 150–400)
RBC: 4.37 MIL/uL (ref 4.22–5.81)
RDW: 12.7 % (ref 11.5–15.5)
WBC: 11.6 10*3/uL — AB (ref 4.0–10.5)

## 2014-12-16 LAB — GLUCOSE, CAPILLARY: Glucose-Capillary: 115 mg/dL — ABNORMAL HIGH (ref 65–99)

## 2014-12-16 NOTE — Progress Notes (Signed)
Pt's SBP went from 150 sitting to 85 while standing. Pt's BP went back to 120/65 standing. Md on call made aware. Will cont to monitor pt.

## 2014-12-16 NOTE — Progress Notes (Signed)
Patient Name: Derek Hogan Date of Encounter: 12/16/2014  Primary Cardiologist: Dr. Haroldine Laws   Principal Problem:   HYPOTENSION, ORTHOSTATIC Active Problems:   PAD (peripheral artery disease) ([place])   Diabetes mellitus, insulin dependent (IDDM), controlled (Derek Hogan)   Hyperlipidemia LDL goal <70   Coronary atherosclerosis of native coronary artery   DIZZINESS    SUBJECTIVE  Denies any CP or SOB. R leg sore.   CURRENT MEDS . aspirin  162 mg Oral Daily  . atorvastatin  80 mg Oral Daily  . clopidogrel  75 mg Oral Daily  . dextrose  25 mL Intravenous STAT  . sodium chloride  500 mL Intravenous Once  . sodium chloride  3 mL Intravenous Q12H  . sodium chloride  3 mL Intravenous Q12H    OBJECTIVE  Filed Vitals:   12/15/14 2342 12/16/14 0025 12/16/14 0047 12/16/14 0500  BP: 120/65 124/57 118/70 129/67  Pulse:    97  Temp:    98.5 F (36.9 C)  TempSrc:    Oral  Resp:  23    Height:      Weight:    239 lb (108.41 kg)  SpO2:    98%    Intake/Output Summary (Last 24 hours) at 12/16/14 0752 Last data filed at 12/16/14 0557  Gross per 24 hour  Intake    480 ml  Output   1275 ml  Net   -795 ml   Filed Weights   12/15/14 0810 12/15/14 2225 12/16/14 0500  Weight: 242 lb (109.77 kg) 239 lb 8 oz (108.636 kg) 239 lb (108.41 kg)    PHYSICAL EXAM  General: Pleasant, NAD. Neuro: Alert and oriented X 3. Moves all extremities spontaneously. Psych: Normal affect. HEENT:  Normal  Neck: Supple without bruits or JVD. Lungs:  Resp regular and unlabored, CTA. Heart: RRR no s3, s4, or murmurs. L femoral cath site stable.  Abdomen: Soft, non-tender, non-distended, BS + x 4.  Extremities: No clubbing, cyanosis or edema. DP pulse weak bilaterally  Accessory Clinical Findings  CBC  Recent Labs  12/13/14 1130  12/15/14 1149 12/16/14 0010  WBC 9.1  --   --  11.6*  HGB  --   < > 14.3 13.8  HCT 44.7  < > 42.0 40.8  MCV  --   --   --  93.4  PLT  --   --   --  188  < > =  values in this interval not displayed. Basic Metabolic Panel  Recent Labs  12/13/14 1130  12/15/14 1020 12/15/14 1149  NA 144  --  142 136  K 5.8*  < > 3.9 4.2  CL 106  --  105 100*  CO2 19  --   --   --   GLUCOSE 257*  --  125* 154*  BUN 11  --  11 10  CREATININE 0.88  --  0.70 0.70  CALCIUM 9.4  --   --   --   < > = values in this interval not displayed.  TELE NSR without significant ventricular ectopy    ECG  No new EKG  Echocardiogram 01/21/2013  LV EF: 60% -  65%  ------------------------------------------------------------ Study Conclusions  - Left ventricle: The cavity size was normal. Wall thickness was normal. Systolic function was normal. The estimated ejection fraction was in the range of 60% to 65%. Wall motion was normal; there were no regional wall motion abnormalities. There was an increased relative contribution of atrial contraction to ventricular  filling. - Left atrium: The atrium was moderately dilated.    Radiology/Studies  No results found.  ASSESSMENT AND PLAN  69 yo Hogan with h/o HTN, HLD, DM, CAD s/p CABG, erectile dysfunction and claudication s/p angioplasty and self-expanding stent placement to the right popliteal artery now with orthostatic hypotension.   1. PAD  - LE angiography 12/15/2014 occluded proximal right tibial artery with reconstitution above the knee and underwent successful angioplasty and self-expanding stent placement to the right popliteal artery  - continue ASA and plavix, not sure why he is on 162mg  ASA. Continue lipitor. Lisinopril on hold.   2. Orthostatic hypotension  - improved with hydration, hgb dropped slightly from 15.0 --> 14.3 --> 13.8  - I have asked nurse to measure orthostatic vital sign again today, if stable can discharge. L femoral site puffy, but no obvious pulsatile mass or bruit on exam, instructed not to lift anything > 5 lbs for 1 week  3. CAD s/p CABG 1996 4. IDDM 5. HLD 6.  HTN  Signed, Derek Deforest PA-C Pager: [phone]   I have seen, examined and evaluated the patient this AM along with Mr. Derek Hogan, [place].  After reviewing all the available data and chart,  I agree with his findings, examination as well as impression recommendations.  Kept overnight due to Hogan-procedural orthostatic hypotension --> recheck of orthostatics this AM (2nd check) was improved.    No claudication pain -just a bit sore in the R leg.   Recommend holding ACE-I x 2 days, then restart daily @ supper.  [place] for d/c.     Derek Hogan, M.D., M.S. Interventional Cardiologist   Pager # [phone]

## 2014-12-16 NOTE — Discharge Summary (Signed)
Discharge Summary   Patient ID: Derek Hogan,  MRN: [id number], DOB/AGE: 1945-05-26 69 y.o.  Admit date: 12/15/2014 Discharge date: 12/16/2014  Primary Care Provider: Kirk Ruths Primary Cardiologist: Dr. Haroldine Laws PV Specialist: Dr. Fletcher Anon  Discharge Diagnoses Principal Problem:   HYPOTENSION, ORTHOSTATIC Active Problems:   PAD (peripheral artery disease) ([place])   Diabetes mellitus, insulin dependent (IDDM), controlled ([place])   Hyperlipidemia LDL goal <70   Coronary atherosclerosis of native coronary artery   DIZZINESS   Peripheral vascular angioplasty status   Allergies No Known Allergies  Procedures  LE angiography 12/15/2014  Conclusion     Findings: Abdominal aorta: Normal in size with no evidence of aneurysm. There is moderate diffuse atherosclerosis. Left renal artery: 50-60% ostial stenosis Right renal artery: Normal Celiac artery: Not well visualized but seems to be patent. Superior mesenteric artery: Patent Right common iliac artery: Minor irregularities.  Right internal iliac artery: normal Right external iliac artery: normal.  Right common femoral artery: MLI Right profunda femoral artery: normal.  Right superficial femoral artery: Mild diffuse disease Right popliteal artery: Proximally occluded . Lesion length is around 60 mm. The vessel is heavily calcified and reconstitutes above the knee via collaterals  Right tibial peroneal trunk:normal Three-vessel runoff below the knee   Left common iliac arterynormal Left internal iliac artery: normal  Left external iliac artery minor irregularities  Left common femoral artery: 20 % diffuse disease Left profunda femoral artery: normal.  Left superficial femoral artery: Mild diffuse disease.  Left popliteal artery: 40% calcified diffuse disease proximally.  3 vessel run off below the knee.    Conclusion: No significant aortoiliac disease. Occluded proximal right tibial artery  with reconstitution above the knee.  successful angioplasty and self-expanding stent placement to the right popliteal artery  Recommndations:  Dual antiplatelet therapy for at least one month.       Hospital Course  69 yo male with PMH of HTN, HLD, IDDM, CAD s/p CABG 1996, and PAD presented with claudication symptom. According to the office note on 11/7, patient went to [place] in July, shortly after, he had noticed right calf discomfort with walking. He underwent venous duplex which showed no evidence of DVT, since he had experienced significant right calf discomfort with walking which happens after about 200 feet distance. This typically forces him to rest a few minutes before he can resume again. He has not been able to do his treadmill or elliptical exercise recently. He underwent noninvasive vascular evaluation at [facility]. ABI was 0.98 on the right and noncompressible on the L side. There was monophasic waveform in the popliteal artery compared to the triphasic waveform in the common femoral artery. After discussing the options, he agreed to undergo diagnostic lower extremity angiography.  He underwent the planned procedure on 12/15/2014 which showed occluded proximal right tibial artery with reconstitution above the knee, he underwent successful angioplasty and a self expanding stent placement in the right popliteal artery. He has been instructed to continue to dual antiplatelet therapy for at least one month. Postprocedure, he had significant orthostatic hypotension with systolic blood pressure dropping down to the 70s upon standing up and improve when laying down. He was kept in the hospital overnight for observation and IV hydration. He was seen on the following morning on 11/10, at which time he was feeling much better without further symptoms. His L femoral cath site is stable, some bruising, but no obvious bruit or pulsatile mass on exam. Orthostatic vital sign was obtained, his systolic  blood pressure did drop from 126 down to 106, but quickly improve to 116 after 3 min. He is cleared for discharge. He has been instructed to hold his lisinopril for 2 days then restart to take at supper time.    Discharge Vitals Blood pressure 129/67, pulse 97, temperature 98.5 F (36.9 C), temperature source Oral, resp. rate 23, height 6' (1.829 m), weight 239 lb (108.41 kg), SpO2 98 %.  Filed Weights   12/15/14 0810 12/15/14 2225 12/16/14 0500  Weight: 242 lb (109.77 kg) 239 lb 8 oz (108.636 kg) 239 lb (108.41 kg)    Labs  CBC  Recent Labs  12/13/14 1130  12/15/14 1149 12/16/14 0010  WBC 9.1  --   --  11.6*  HGB  --   < > 14.3 13.8  HCT 44.7  < > 42.0 40.8  MCV  --   --   --  93.4  PLT  --   --   --  188  < > = values in this interval not displayed. Basic Metabolic Panel  Recent Labs  12/13/14 1130  12/15/14 1020 12/15/14 1149  NA 144  --  142 136  K 5.8*  < > 3.9 4.2  CL 106  --  105 100*  CO2 19  --   --   --   GLUCOSE 257*  --  125* 154*  BUN 11  --  11 10  CREATININE 0.88  --  0.70 0.70  CALCIUM 9.4  --   --   --   < > = values in this interval not displayed.  Disposition  Pt is being discharged home today in good condition.  Follow-up Plans & Appointments      Follow-up Information    Follow up with Kathlyn Sacramento, MD On 01/06/2015.   Specialty:  Cardiology   Why:  2:45pm   Contact information:   [place] [phone]       Discharge Medications    Medication List    TAKE these medications        aspirin 162 MG EC tablet  Take 162 mg by mouth daily.     atorvastatin 80 MG tablet  Commonly known as:  LIPITOR  Take 1 tablet (80 mg total) by mouth daily.     clopidogrel 75 MG tablet  Commonly known as:  PLAVIX  Take 1 tablet (75 mg total) by mouth daily.     insulin aspart 100 unit/mL injection  Commonly known as:  novoLOG  Inject into the skin continuous. Insulin Pump     lisinopril 2.5  MG tablet  Commonly known as:  PRINIVIL,ZESTRIL  Take 2.5 mg by mouth at bedtime.     NITROSTAT 0.4 MG SL tablet  Generic drug:  nitroGLYCERIN  USE AS DIRECTED     omeprazole 20 MG capsule  Commonly known as:  PRILOSEC  Take 20 mg by mouth daily.     sildenafil 100 MG tablet  Commonly known as:  VIAGRA  Take 100 mg by mouth daily as needed for erectile dysfunction.     zolpidem 10 MG tablet  Commonly known as:  AMBIEN  Take 10 mg by mouth at bedtime as needed for sleep.        Duration of Discharge Encounter   Greater than 30 minutes including physician time.  Hilbert Corrigan PA-C Pager: [phone] 12/16/2014, 10:08 AM   I have seen, examined and evaluated the patient this  AM along with Mr. [name], [place]. After reviewing all the available data and chart, I agree with his findings, examination as well as impression recommendations.  Kept overnight due to post-procedural orthostatic hypotension --> recheck of orthostatics this AM (2nd check) was improved.    No claudication pain -just a bit sore in the R leg.   Recommend holding ACE-I x 2 days, then restart daily @ supper.  [place] for d/c.    Leonie Man, M.D., M.S. Interventional Cardiologist   Pager # [phone]

## 2014-12-16 NOTE — Progress Notes (Signed)
Discharge teaching and instructions reviewed. No questions. VSS. Pt discharging home with wife.

## 2014-12-17 ENCOUNTER — Encounter (HOSPITAL_COMMUNITY): Payer: Self-pay | Admitting: Cardiovascular Disease

## 2014-12-20 ENCOUNTER — Ambulatory Visit (INDEPENDENT_AMBULATORY_CARE_PROVIDER_SITE_OTHER): Payer: Medicare Other

## 2014-12-20 ENCOUNTER — Ambulatory Visit: Payer: Medicare Other | Admitting: Cardiovascular Disease

## 2014-12-20 VITALS — BP 150/77 | HR 87 | Resp 16 | Wt 243.1 lb

## 2014-12-20 DIAGNOSIS — I739 Peripheral vascular disease, unspecified: Secondary | ICD-10-CM

## 2014-12-20 NOTE — Patient Instructions (Signed)
1.) Reason for visit: Pt walk in   2.) Name of MD requesting visit: none  3.) H&P: Pt had abdominal aortogram November 9, Dr. Fletcher Anon at [facility] for PAD  4.) ROS related to problem: Pt presents to office today c/o bruising and a "knot" at site. States he would like a nurse to look at it.  Small hematoma noted at site of insertion, bruising surrounding area.  Denies soreness or tenderness. States he picked up 20lbs yesterday which may have been more than he was suppose to do so soon after procedure. Pt also states he thinks his legs are swollen. Upon examination, no pitting edema noted. Otherwise, pt is feeling well and states he was cleared to get on the eliptical machine today.  .   5.) Assessment and plan: Advised pt to adhere to weight limit restrictions, ice area, continue to monitor and call if is increasing in size   Advised pt to elevate legs several times a day, watch sodium intake and take medications as prescribed. Pt states he will restart lisinopril this evening.

## 2015-01-06 ENCOUNTER — Encounter: Payer: Self-pay | Admitting: Cardiovascular Disease

## 2015-01-06 ENCOUNTER — Ambulatory Visit (INDEPENDENT_AMBULATORY_CARE_PROVIDER_SITE_OTHER): Payer: Medicare Other | Admitting: Cardiovascular Disease

## 2015-01-06 ENCOUNTER — Ambulatory Visit: Payer: Medicare Other | Admitting: Cardiovascular Disease

## 2015-01-06 VITALS — BP 130/60 | HR 75 | Ht 72.0 in | Wt 245.2 lb

## 2015-01-06 DIAGNOSIS — I739 Peripheral vascular disease, unspecified: Secondary | ICD-10-CM | POA: Diagnosis not present

## 2015-01-06 DIAGNOSIS — E785 Hyperlipidemia, unspecified: Secondary | ICD-10-CM

## 2015-01-06 DIAGNOSIS — I6523 Occlusion and stenosis of bilateral carotid arteries: Secondary | ICD-10-CM | POA: Diagnosis not present

## 2015-01-06 DIAGNOSIS — I2510 Atherosclerotic heart disease of native coronary artery without angina pectoris: Secondary | ICD-10-CM

## 2015-01-06 NOTE — Progress Notes (Signed)
Primary cardiologist: Dr. Haroldine Laws  HPI  This is a pleasant 69 year old male who is here today for a follow-up visit regarding peripheral arterial disease.  He has known history of CAD S/P previous inferior wall myocardial infarction followed by CABG 1996. He had questionable TIA in March 2016. He quit smoking in 1996 after CABG. He has prolonged history of diabetes currently on insulin pump, hyperlipidemia and hypertension. He was seen recently for severe right calf claudication.  He underwent noninvasive vascular evaluation at the [facility]. ABI was 0.98 on the right and noncompressible on the left side. There was monophasic waveform in the popliteal artery compared to triphasic waveform in the common femoral artery. I proceeded with angiography which showed occluded proximal right popliteal artery with reconstitution above the knee. This was treated successfully with drug-coated balloon angioplasty. He had a flow-limiting dissection which required placement of a self-expanding stent. He reports resolution of claudication since then. He resumed exercise without any symptoms.  No Known Allergies   Current Outpatient Prescriptions on File Prior to Visit  Medication Sig Dispense Refill  . aspirin 162 MG EC tablet Take 162 mg by mouth daily.    Marland Kitchen atorvastatin (LIPITOR) 80 MG tablet Take 1 tablet (80 mg total) by mouth daily.    . clopidogrel (PLAVIX) 75 MG tablet Take 1 tablet (75 mg total) by mouth daily. 30 tablet 6  . insulin aspart (NOVOLOG) 100 unit/mL injection Inject into the skin continuous. Insulin Pump    . lisinopril (PRINIVIL,ZESTRIL) 2.5 MG tablet Take 2.5 mg by mouth at bedtime.     . nitroGLYCERIN (NITROSTAT) 0.4 MG SL tablet USE AS DIRECTED    . omeprazole (PRILOSEC) 20 MG capsule Take 20 mg by mouth daily.      . sildenafil (VIAGRA) 100 MG tablet Take 100 mg by mouth daily as needed for erectile dysfunction.     Marland Kitchen zolpidem (AMBIEN) 10 MG tablet Take 10 mg by mouth at bedtime  as needed for sleep.     No current facility-administered medications on file prior to visit.     Past Medical History  Diagnosis Date  . Hypertension   . UNSPECIFIED PERIPHERAL VASCULAR DISEASE 10/06/2008  . HYPERTENSION, UNSPECIFIED 06/09/2008  . HYPERLIPIDEMIA-MIXED 06/09/2008  . ERECTILE DYSFUNCTION 06/09/2008  . Hypotension   . Viral gastroenteritis     04/19/2010 improved  . Dyslipidemia   . GERD (gastroesophageal reflux disease)   . Weakness   . PVD (peripheral vascular disease) ([place])     -- s/p right carotid endarterectomy by Dr. Drucie Opitz -- carotid u/s 0-39% (9/10) -- ABI L 0.95 R 1.0 (2/07), LE angiography by Dr. Fletcher Anon 11/9 stent to R pop artery  . Erectile dysfunction   . CAD, ARTERY BYPASS GRAFT 06/09/2008  . Heart attack ([place]) 1996  . Type I diabetes mellitus ([place]) dx'd 08/20/1966  . Orthostatic hypotension     positive by orthostatic vital signs, encourage IV hydration     Past Surgical History  Procedure Laterality Date  . Refractive surgery Right     "before cataract OR"  . Carotid endarterectomy Right   . Coronary artery bypass graft  1996    "CABG X4"  . Cataract extraction, bilateral Bilateral   . Cardiac catheterization  1996; ~ 2010  . Coronary angioplasty    . Peripheral vascular catheterization N/A 12/15/2014    Procedure: Abdominal Aortogram;  Surgeon: Wellington Hampshire, MD;  Location: [facility];  Service: Cardiovascular;  Laterality: N/A;  Family History  Problem Relation Age of Onset  . Coronary artery disease Mother   . Stroke    . Coronary artery disease Father   . Diabetes Mother   . Diabetes Father      Social History   Social History  . Marital Status: Married    Spouse Name: N/A  . Number of Children: N/A  . Years of Education: N/A   Occupational History  . retired    Social History Main Topics  . Smoking status: Former Smoker -- 2.50 packs/day for 35 years    Types: Cigarettes    Quit date: 02/05/1994  . Smokeless  tobacco: Never Used  . Alcohol Use: Yes     Comment: pt has a remote history of alcoholism / he quit on Halloween in 1982  . Drug Use: No  . Sexual Activity: Yes   Other Topics Concern  . Not on file   Social History Narrative   SOCIAL HISTORY:  The patient is married with children.  He is a nonsmoker.  He has a remote history of smoking, he quit in 1996.  He has a remote history of alcoholism.  He quit on Halloween in 1982.  He denies any illicit drug usage.                FAMILY HISTORY:  Positive for coronary artery disease and diabetic disease in both parents.      ROS A 10 point review of system was performed. It is negative other than that mentioned in the history of present illness.   PHYSICAL EXAM   BP 130/60 mmHg  Pulse 75  Ht 6' (1.829 m)  Wt 245 lb 4 oz (111.245 kg)  BMI 33.25 kg/m2 Constitutional: He is oriented to person, place, and time. He appears well-developed and well-nourished. No distress.  HENT: No nasal discharge.  Head: Normocephalic and atraumatic.  Eyes: Pupils are equal and round.  No discharge. Neck: Normal range of motion. Neck supple. No JVD present. No thyromegaly present.  Cardiovascular: Normal rate, regular rhythm, normal heart sounds. Exam reveals no gallop and no friction rub. No murmur heard.  Pulmonary/Chest: Effort normal and breath sounds normal. No stridor. No respiratory distress. He has no wheezes. He has no rales. He exhibits no tenderness.  Abdominal: Soft. Bowel sounds are normal. He exhibits no distension. There is no tenderness. There is no rebound and no guarding.  Musculoskeletal: Normal range of motion. He exhibits no edema and no tenderness.  Neurological: He is alert and oriented to person, place, and time. Coordination normal.  Skin: Skin is warm and dry. No rash noted. He is not diaphoretic. No erythema. No pallor.  Psychiatric: He has a normal mood and affect. His behavior is normal. Judgment and thought content normal.    Vascular: Femoral pulses are normal bilaterally. Distal pulses are not palpable. No groin hematoma. Small scar tissue on the left side.      ASSESSMENT AND PLAN

## 2015-01-06 NOTE — Progress Notes (Signed)
See nursing note

## 2015-01-06 NOTE — Patient Instructions (Signed)
Medication Instructions:  Your physician recommends that you continue on your current medications as directed. Please refer to the Current Medication list given to you today.   Labwork: none  Testing/Procedures: Your physician has requested that you have a lower extremity arterial duplex. This test is an ultrasound of the arteries in the legs. It looks at arterial blood flow in the legs. Allow one hour for Lower Arterial scans. There are no restrictions or special instructions   Follow-Up: Your physician wants you to follow-up in: six months with Dr. Fletcher Anon.  You will receive a reminder letter in the mail two months in advance. If you don't receive a letter, please call our office to schedule the follow-up appointment.   Any Other Special Instructions Will Be Listed Below (If Applicable).     If you need a refill on your cardiac medications before your next appointment, please call your pharmacy.

## 2015-01-09 NOTE — Assessment & Plan Note (Signed)
He has no anginal symptoms. 

## 2015-01-09 NOTE — Assessment & Plan Note (Signed)
Continue treatment with atorvastatin with a target LDL of less than 70. 

## 2015-01-09 NOTE — Assessment & Plan Note (Signed)
Right calf claudication resolved completely after recent angioplasty and stent placement to the right popliteal artery above the knee. Recommend dual antiplatelet therapy for at least another few months. I requested lower extremity arterial duplex has a follow-up. He will require another follow-up in 6 months. This area is high risk for restenosis.

## 2015-01-10 ENCOUNTER — Other Ambulatory Visit: Payer: Self-pay | Admitting: Cardiovascular Disease

## 2015-01-10 DIAGNOSIS — I739 Peripheral vascular disease, unspecified: Secondary | ICD-10-CM

## 2015-01-25 ENCOUNTER — Ambulatory Visit: Payer: Medicare Other

## 2015-01-25 ENCOUNTER — Other Ambulatory Visit: Payer: Self-pay | Admitting: Cardiovascular Disease

## 2015-01-25 DIAGNOSIS — Z959 Presence of cardiac and vascular implant and graft, unspecified: Secondary | ICD-10-CM

## 2015-01-25 DIAGNOSIS — I739 Peripheral vascular disease, unspecified: Secondary | ICD-10-CM

## 2015-01-27 ENCOUNTER — Other Ambulatory Visit: Payer: Self-pay

## 2015-01-27 ENCOUNTER — Ambulatory Visit: Payer: Medicare Other | Admitting: Cardiovascular Disease

## 2015-01-27 DIAGNOSIS — I6523 Occlusion and stenosis of bilateral carotid arteries: Secondary | ICD-10-CM

## 2015-02-03 DIAGNOSIS — I739 Peripheral vascular disease, unspecified: Secondary | ICD-10-CM | POA: Insufficient documentation

## 2015-03-16 ENCOUNTER — Encounter (HOSPITAL_COMMUNITY): Payer: Self-pay | Admitting: Emergency Medicine

## 2015-03-16 ENCOUNTER — Emergency Department (HOSPITAL_COMMUNITY)
Admission: EM | Admit: 2015-03-16 | Discharge: 2015-03-17 | Disposition: A | Discharge status: Home / Self Care | Payer: Medicare Other | Attending: Emergency Medicine | Admitting: Emergency Medicine

## 2015-03-16 DIAGNOSIS — I2510 Atherosclerotic heart disease of native coronary artery without angina pectoris: Secondary | ICD-10-CM | POA: Diagnosis not present

## 2015-03-16 DIAGNOSIS — I252 Old myocardial infarction: Secondary | ICD-10-CM | POA: Insufficient documentation

## 2015-03-16 DIAGNOSIS — Z87891 Personal history of nicotine dependence: Secondary | ICD-10-CM | POA: Insufficient documentation

## 2015-03-16 DIAGNOSIS — E109 Type 1 diabetes mellitus without complications: Secondary | ICD-10-CM | POA: Diagnosis not present

## 2015-03-16 DIAGNOSIS — Z7902 Long term (current) use of antithrombotics/antiplatelets: Secondary | ICD-10-CM | POA: Diagnosis not present

## 2015-03-16 DIAGNOSIS — H8110 Benign paroxysmal vertigo, unspecified ear: Secondary | ICD-10-CM | POA: Diagnosis not present

## 2015-03-16 DIAGNOSIS — Z8619 Personal history of other infectious and parasitic diseases: Secondary | ICD-10-CM | POA: Insufficient documentation

## 2015-03-16 DIAGNOSIS — E782 Mixed hyperlipidemia: Secondary | ICD-10-CM | POA: Insufficient documentation

## 2015-03-16 DIAGNOSIS — Z951 Presence of aortocoronary bypass graft: Secondary | ICD-10-CM | POA: Diagnosis not present

## 2015-03-16 DIAGNOSIS — I10 Essential (primary) hypertension: Secondary | ICD-10-CM | POA: Insufficient documentation

## 2015-03-16 DIAGNOSIS — Z9861 Coronary angioplasty status: Secondary | ICD-10-CM | POA: Insufficient documentation

## 2015-03-16 DIAGNOSIS — Z794 Long term (current) use of insulin: Secondary | ICD-10-CM | POA: Insufficient documentation

## 2015-03-16 DIAGNOSIS — Z79899 Other long term (current) drug therapy: Secondary | ICD-10-CM | POA: Insufficient documentation

## 2015-03-16 DIAGNOSIS — Z87438 Personal history of other diseases of male genital organs: Secondary | ICD-10-CM | POA: Diagnosis not present

## 2015-03-16 DIAGNOSIS — Z9889 Other specified postprocedural states: Secondary | ICD-10-CM | POA: Insufficient documentation

## 2015-03-16 DIAGNOSIS — K219 Gastro-esophageal reflux disease without esophagitis: Secondary | ICD-10-CM | POA: Insufficient documentation

## 2015-03-16 DIAGNOSIS — R600 Localized edema: Secondary | ICD-10-CM | POA: Insufficient documentation

## 2015-03-16 DIAGNOSIS — R42 Dizziness and giddiness: Secondary | ICD-10-CM | POA: Diagnosis present

## 2015-03-16 DIAGNOSIS — Z7982 Long term (current) use of aspirin: Secondary | ICD-10-CM | POA: Diagnosis not present

## 2015-03-16 LAB — CBC WITH DIFFERENTIAL/PLATELET
BASOS ABS: 0 10*3/uL (ref 0.0–0.1)
BASOS PCT: 0 %
EOS ABS: 0.2 10*3/uL (ref 0.0–0.7)
Eosinophils Relative: 2 %
HCT: 41.8 % (ref 39.0–52.0)
HEMOGLOBIN: 14.4 g/dL (ref 13.0–17.0)
LYMPHS ABS: 3 10*3/uL (ref 0.7–4.0)
Lymphocytes Relative: 31 %
MCH: 31.6 pg (ref 26.0–34.0)
MCHC: 34.4 g/dL (ref 30.0–36.0)
MCV: 91.9 fL (ref 78.0–100.0)
Monocytes Absolute: 0.9 10*3/uL (ref 0.1–1.0)
Monocytes Relative: 10 %
NEUTROS PCT: 57 %
Neutro Abs: 5.6 10*3/uL (ref 1.7–7.7)
PLATELETS: 232 10*3/uL (ref 150–400)
RBC: 4.55 MIL/uL (ref 4.22–5.81)
RDW: 12.8 % (ref 11.5–15.5)
WBC: 9.7 10*3/uL (ref 4.0–10.5)

## 2015-03-16 LAB — COMPREHENSIVE METABOLIC PANEL
ALBUMIN: 3.7 g/dL (ref 3.5–5.0)
ALK PHOS: 62 U/L (ref 38–126)
ALT: 23 U/L (ref 17–63)
ANION GAP: 11 (ref 5–15)
AST: 25 U/L (ref 15–41)
BUN: 11 mg/dL (ref 6–20)
CALCIUM: 9.5 mg/dL (ref 8.9–10.3)
CHLORIDE: 107 mmol/L (ref 101–111)
CO2: 24 mmol/L (ref 22–32)
Creatinine, Ser: 1.02 mg/dL (ref 0.61–1.24)
GFR calc non Af Amer: >60 mL/min (ref >60)
GLUCOSE: 77 mg/dL (ref 65–99)
POTASSIUM: 4 mmol/L (ref 3.5–5.1)
SODIUM: 142 mmol/L (ref 135–145)
Total Bilirubin: 0.7 mg/dL (ref 0.3–1.2)
Total Protein: 6.3 g/dL — ABNORMAL LOW (ref 6.5–8.1)

## 2015-03-16 LAB — URINALYSIS, ROUTINE W REFLEX MICROSCOPIC
Bilirubin Urine: NEGATIVE
GLUCOSE, UA: NEGATIVE mg/dL
Hgb urine dipstick: NEGATIVE
KETONES UR: NEGATIVE mg/dL
LEUKOCYTES UA: NEGATIVE
NITRITE: NEGATIVE
PH: 5.5 (ref 5.0–8.0)
Protein, ur: NEGATIVE mg/dL
Specific Gravity, Urine: 1.008 (ref 1.005–1.030)

## 2015-03-16 NOTE — ED Notes (Signed)
Pt. reports dizziness / lightheaded " spinning around" onset this morning , denies nausea or vomitting / no blurred vision . No fever or chills.

## 2015-03-16 NOTE — ED Provider Notes (Signed)
CSN: [id number]     Arrival date & time 03/16/15  2134 History  By signing my name below, I, Evelene Croon, attest that this documentation has been prepared under the direction and in the presence of Delora Fuel, MD . Electronically Signed: Evelene Croon, Scribe. 03/17/2015. 12:14 AM.  Chief Complaint  Patient presents with  . Dizziness    The history is provided by the patient. No language interpreter was used.   HPI Comments:  Derek Hogan is a 70 y.o. male with a history of HTN, DM, PVD, and CAD, who presents to the Emergency Department complaining of intermittent episodes of room spinning dizziness. The first episode occurred ~ 0200/0300 this AM when he got out of bed to use the bathroom and the second episode occurred this evening when he was getting into bed. He denies nausea, vomiting, hearing loss, tinnitus, vision change, ear pain, HA, and weakness. No alleviating factors noted.  Past Medical History  Diagnosis Date  . Hypertension   . UNSPECIFIED PERIPHERAL VASCULAR DISEASE 10/06/2008  . HYPERTENSION, UNSPECIFIED 06/09/2008  . HYPERLIPIDEMIA-MIXED 06/09/2008  . ERECTILE DYSFUNCTION 06/09/2008  . Hypotension   . Viral gastroenteritis     04/19/2010 improved  . Dyslipidemia   . GERD (gastroesophageal reflux disease)   . Weakness   . PVD (peripheral vascular disease) ([place])     -- s/p right carotid endarterectomy by Dr. Drucie Opitz -- carotid u/s 0-39% (9/10) -- ABI L 0.95 R 1.0 (2/07), LE angiography by Dr. Fletcher Anon 11/9 stent to R pop artery  . Erectile dysfunction   . CAD, ARTERY BYPASS GRAFT 06/09/2008  . Heart attack ([name]) 1996  . Type I diabetes mellitus ([place]) dx'd 08/20/1966  . Orthostatic hypotension     positive by orthostatic vital signs, encourage IV hydration   Past Surgical History  Procedure Laterality Date  . Refractive surgery Right     "before cataract OR"  . Carotid endarterectomy Right   . Coronary artery bypass graft  1996    "CABG X4"  . Cataract extraction,  bilateral Bilateral   . Cardiac catheterization  1996; ~ 2010  . Coronary angioplasty    . Peripheral vascular catheterization N/A 12/15/2014    Procedure: Abdominal Aortogram;  Surgeon: Wellington Hampshire, MD;  Location: [facility];  Service: Cardiovascular;  Laterality: N/A;   Family History  Problem Relation Age of Onset  . Coronary artery disease Mother   . Stroke    . Coronary artery disease Father   . Diabetes Mother   . Diabetes Father    Social History  Substance Use Topics  . Smoking status: Former Smoker -- 2.50 packs/day for 35 years    Types: Cigarettes    Quit date: 02/05/1994  . Smokeless tobacco: Never Used  . Alcohol Use: Yes    Review of Systems  Constitutional: Negative for fever and chills.  HENT: Negative for ear pain, hearing loss and tinnitus.   Eyes: Negative for visual disturbance.  Gastrointestinal: Negative for nausea and vomiting.  Neurological: Positive for dizziness. Negative for weakness and headaches.  All other systems reviewed and are negative.   Allergies  Review of patient's allergies indicates no known allergies.  Home Medications   Prior to Admission medications   Medication Sig Start Date End Date Taking? Authorizing Provider  aspirin 162 MG EC tablet Take 162 mg by mouth daily.   Yes Historical Provider, MD  atorvastatin (LIPITOR) 80 MG tablet Take 1 tablet (80 mg total) by mouth  daily. 05/23/11  Yes Jolaine Artist, MD  clopidogrel (PLAVIX) 75 MG tablet Take 1 tablet (75 mg total) by mouth daily. 12/15/14  Yes Wellington Hampshire, MD  insulin aspart (NOVOLOG) 100 unit/mL injection Inject into the skin continuous. Insulin Pump   Yes Historical Provider, MD  lisinopril (PRINIVIL,ZESTRIL) 2.5 MG tablet Take 2.5 mg by mouth at bedtime.  07/18/10  Yes Jolaine Artist, MD  nitroGLYCERIN (NITROSTAT) 0.4 MG SL tablet Place 0.4 mg under the tongue every 5 (five) minutes as needed for chest pain.   Yes Historical Provider, MD  omeprazole  (PRILOSEC) 20 MG capsule Take 20 mg by mouth daily.     Yes Historical Provider, MD  zolpidem (AMBIEN) 10 MG tablet Take 10 mg by mouth at bedtime as needed for sleep.   Yes Historical Provider, MD  meclizine (ANTIVERT) 25 MG tablet Take 1 tablet (25 mg total) by mouth 3 (three) times daily as needed for dizziness. [id number]   Delora Fuel, MD   BP 0000000 mmHg  Pulse 69  Temp(Src) 97.9 F (36.6 C) (Oral)  Resp 13  Ht 6' (1.829 m)  Wt 244 lb (110.678 kg)  BMI 33.09 kg/m2  SpO2 94% Physical Exam  Constitutional: He is oriented to person, place, and time. He appears well-developed and well-nourished. No distress.  HENT:  Head: Normocephalic and atraumatic.  Right Ear: Tympanic membrane normal.  Left Ear: Tympanic membrane normal.  Eyes: Conjunctivae and EOM are normal. Pupils are equal, round, and reactive to light. Right eye exhibits no nystagmus. Left eye exhibits no nystagmus.  Neck: Normal range of motion. Neck supple.  No carotid bruit  Cardiovascular: Normal rate, regular rhythm and normal heart sounds.   No murmur heard. Pulmonary/Chest: Effort normal and breath sounds normal. He has no wheezes. He has no rales. He exhibits no tenderness.  Abdominal: Soft. Bowel sounds are normal. He exhibits no mass. There is no tenderness. There is no rebound.  Musculoskeletal: Normal range of motion. He exhibits edema.  Trace pitting edema bilaterally  Neurological: He is alert and oriented to person, place, and time. No cranial nerve deficit. He exhibits normal muscle tone. Coordination normal.  Dizziness not reproduced by passive head movement.  Skin: Skin is warm and dry. No rash noted.  Psychiatric: He has a normal mood and affect. His behavior is normal. Judgment and thought content normal.  Nursing note and vitals reviewed.   ED Course  Procedures   DIAGNOSTIC STUDIES:  Oxygen Saturation is 99% on RA, normal by my interpretation.    COORDINATION OF CARE:  12:05 AM Will discharge  with Rx for meclizine. Discussed treatment plan with pt at bedside and pt agreed to plan.  Labs Review Results for orders placed or performed during the hospital encounter of 03/16/15  CBC with Differential  Result Value Ref Range   WBC 9.7 4.0 - 10.5 K/uL   RBC 4.55 4.22 - 5.81 MIL/uL   Hemoglobin 14.4 13.0 - 17.0 g/dL   HCT 41.8 39.0 - 52.0 %   MCV 91.9 78.0 - 100.0 fL   MCH 31.6 26.0 - 34.0 pg   MCHC 34.4 30.0 - 36.0 g/dL   RDW 12.8 11.5 - 15.5 %   Platelets 232 150 - 400 K/uL   Neutrophils Relative % 57 %   Neutro Abs 5.6 1.7 - 7.7 K/uL   Lymphocytes Relative 31 %   Lymphs Abs 3.0 0.7 - 4.0 K/uL   Monocytes Relative 10 %   Monocytes Absolute  0.9 0.1 - 1.0 K/uL   Eosinophils Relative 2 %   Eosinophils Absolute 0.2 0.0 - 0.7 K/uL   Basophils Relative 0 %   Basophils Absolute 0.0 0.0 - 0.1 K/uL  Comprehensive metabolic panel  Result Value Ref Range   Sodium 142 135 - 145 mmol/L   Potassium 4.0 3.5 - 5.1 mmol/L   Chloride 107 101 - 111 mmol/L   CO2 24 22 - 32 mmol/L   Glucose, Bld 77 65 - 99 mg/dL   BUN 11 6 - 20 mg/dL   Creatinine, Ser 1.02 0.61 - 1.24 mg/dL   Calcium 9.5 8.9 - 10.3 mg/dL   Total Protein 6.3 (L) 6.5 - 8.1 g/dL   Albumin 3.7 3.5 - 5.0 g/dL   AST 25 15 - 41 U/L   ALT 23 17 - 63 U/L   Alkaline Phosphatase 62 38 - 126 U/L   Total Bilirubin 0.7 0.3 - 1.2 mg/dL   GFR calc non Af Amer >60 >60 mL/min   GFR calc Af Amer >60 >60 mL/min   Anion gap 11 5 - 15  Urinalysis, Routine w reflex microscopic (not at [facility])  Result Value Ref Range   Color, Urine YELLOW YELLOW   APPearance CLEAR CLEAR   Specific Gravity, Urine 1.008 1.005 - 1.030   pH 5.5 5.0 - 8.0   Glucose, UA NEGATIVE NEGATIVE mg/dL   Hgb urine dipstick NEGATIVE NEGATIVE   Bilirubin Urine NEGATIVE NEGATIVE   Ketones, ur NEGATIVE NEGATIVE mg/dL   Protein, ur NEGATIVE NEGATIVE mg/dL   Nitrite NEGATIVE NEGATIVE   Leukocytes, UA NEGATIVE NEGATIVE    I have personally reviewed and evaluated these  lab results as part of my medical decision-making.   EKG Interpretation  Date/Time:  Wednesday March 16 2015 21:57:39 EST Ventricular Rate:  72 PR Interval:  162 QRS Duration: 104 QT Interval:  394 QTC Calculation: 431 R Axis:   21 Text Interpretation:  Normal sinus rhythm Possible Inferior infarct , age  undetermined Anterior infarct , age undetermined Abnormal ECG When  compared with ECG of 11/29/2013, No significant change was found Confirmed  by [facility]  MD, Yvette Roark ([id number]) on 03/16/2015 11:52:53 PM      MDM   Final diagnoses:  Benign positional vertigo, unspecified laterality    Benign positional vertigo. No evidence of central vertigo. Skin symptoms are clearly related to position change and of short duration. No indication for advanced imaging. Old records reviewed and he does have history of peripheral artery disease and coronary artery disease. He is being followed by vascular surgery and cardiology. He is discharged with prescription for meclizine.  I personally performed the services described in this documentation, which was scribed in my presence. The recorded information has been reviewed and is accurate.      Delora Fuel, MD [id number]

## 2015-03-17 MED ORDER — MECLIZINE HCL 25 MG PO TABS: 25.0000 mg | ORAL_TABLET | Freq: Three times a day (TID) | ORAL | Status: DC | PRN

## 2015-03-17 MED ORDER — MECLIZINE HCL 25 MG PO TABS
25.0000 mg | ORAL_TABLET | Freq: Once | ORAL | Status: AC
  Administered 2015-03-17: 25 mg via ORAL
  Filled 2015-03-17: qty 1

## 2015-03-17 NOTE — Discharge Instructions (Signed)
Benign Positional Vertigo °Vertigo is the feeling that you or your surroundings are moving when they are not. Benign positional vertigo is the most common form of vertigo. The cause of this condition is not serious (is benign). This condition is triggered by certain movements and positions (is positional). This condition can be dangerous if it occurs while you are doing something that could endanger you or others, such as driving.  °CAUSES °In many cases, the cause of this condition is not known. It may be caused by a disturbance in an area of the inner ear that helps your brain to sense movement and balance. This disturbance can be caused by a viral infection (labyrinthitis), head injury, or repetitive motion. °RISK FACTORS °This condition is more likely to develop in: °· Women. °· People who are 50 years of age or older. °SYMPTOMS °Symptoms of this condition usually happen when you move your head or your eyes in different directions. Symptoms may start suddenly, and they usually last for less than a minute. Symptoms may include: °· Loss of balance and falling. °· Feeling like you are spinning or moving. °· Feeling like your surroundings are spinning or moving. °· Nausea and vomiting. °· Blurred vision. °· Dizziness. °· Involuntary eye movement (nystagmus). °Symptoms can be mild and cause only slight annoyance, or they can be severe and interfere with daily life. Episodes of benign positional vertigo may return (recur) over time, and they may be triggered by certain movements. Symptoms may improve over time. °DIAGNOSIS °This condition is usually diagnosed by medical history and a physical exam of the head, neck, and ears. You may be referred to a health care provider who specializes in ear, nose, and throat (ENT) problems (otolaryngologist) or a provider who specializes in disorders of the nervous system (neurologist). You may have additional testing, including: °· MRI. °· A CT scan. °· Eye movement tests. Your  health care provider may ask you to change positions quickly while he or she watches you for symptoms of benign positional vertigo, such as nystagmus. Eye movement may be tested with an electronystagmogram (ENG), caloric stimulation, the Dix-Hallpike test, or the roll test. °· An electroencephalogram (EEG). This records electrical activity in your brain. °· Hearing tests. °TREATMENT °Usually, your health care provider will treat this by moving your head in specific positions to adjust your inner ear back to normal. Surgery may be needed in severe cases, but this is rare. In some cases, benign positional vertigo may resolve on its own in 2-4 weeks. °HOME CARE INSTRUCTIONS °Safety °· Move slowly. Avoid sudden body or head movements. °· Avoid driving. °· Avoid operating heavy machinery. °· Avoid doing any tasks that would be dangerous to you or others if a vertigo episode would occur. °· If you have trouble walking or keeping your balance, try using a cane for stability. If you feel dizzy or unstable, sit down right away. °· Return to your normal activities as told by your health care provider. Ask your health care provider what activities are safe for you. °General Instructions °· Take over-the-counter and prescription medicines only as told by your health care provider. °· Avoid certain positions or movements as told by your health care provider. °· Drink enough fluid to keep your urine clear or pale yellow. °· Keep all follow-up visits as told by your health care provider. This is important. °SEEK MEDICAL CARE IF: °· You have a fever. °· Your condition gets worse or you develop new symptoms. °· Your family or friends   notice any behavioral changes.  Your nausea or vomiting gets worse.  You have numbness or a "pins and needles" sensation. SEEK IMMEDIATE MEDICAL CARE IF:  You have difficulty speaking or moving.  You are always dizzy.  You faint.  You develop severe headaches.  You have weakness in your  legs or arms.  You have changes in your hearing or vision.  You develop a stiff neck.  You develop sensitivity to light.   This information is not intended to replace advice given to you by your health care provider. Make sure you discuss any questions you have with your health care provider.   Document Released: 10/30/2005 Document Revised: 10/13/2014 Document Reviewed: 05/17/2014 Elsevier Interactive Patient Education 2016 [place] tablets or capsules What is this medicine? MECLIZINE (MEK li zeen) is an antihistamine. It is used to prevent nausea, vomiting, or dizziness caused by motion sickness. It is also used to prevent and treat vertigo (extreme dizziness or a feeling that you or your surroundings are tilting or spinning around). This medicine may be used for other purposes; ask your health care provider or pharmacist if you have questions. What should I tell my health care provider before I take this medicine? They need to know if you have any of these conditions: -glaucoma -lung or breathing disease, like asthma -problems urinating -prostate disease -stomach or intestine problems -an unusual or allergic reaction to meclizine, other medicines, foods, dyes, or preservatives -pregnant or trying to get pregnant -breast-feeding How should I use this medicine? Take this medicine by mouth with a glass of water. Follow the directions on the prescription label. If you are using this medicine to prevent motion sickness, take the dose at least 1 hour before travel. If it upsets your stomach, take it with food or milk. Take your doses at regular intervals. Do not take your medicine more often than directed. Talk to your pediatrician regarding the use of this medicine in children. Special care may be needed. Overdosage: If you think you have taken too much of this medicine contact a poison control center or emergency room at once. NOTE: This medicine is only for you. Do not  share this medicine with others. What if I miss a dose? If you miss a dose, take it as soon as you can. If it is almost time for your next dose, take only that dose. Do not take double or extra doses. What may interact with this medicine? Do not take this medicine with any of the following medications: -MAOIs like Carbex, Eldepryl, Marplan, Nardil, and Parnate This medicine may also interact with the following medications: -alcohol -antihistamines for allergy, cough and cold -certain medicines for anxiety or sleep -certain medicines for depression, like amitriptyline, fluoxetine, sertraline -certain medicines for seizures like phenobarbital, primidone -general anesthetics like halothane, isoflurane, methoxyflurane, propofol -local anesthetics like lidocaine, pramoxine, tetracaine -medicines that relax muscles for surgery -narcotic medicines for pain -phenothiazines like chlorpromazine, mesoridazine, prochlorperazine, thioridazine This list may not describe all possible interactions. Give your health care provider a list of all the medicines, herbs, non-prescription drugs, or dietary supplements you use. Also tell them if you smoke, drink alcohol, or use illegal drugs. Some items may interact with your medicine. What should I watch for while using this medicine? Tell your doctor or healthcare professional if your symptoms do not start to get better or if they get worse. You may get drowsy or dizzy. Do not drive, use machinery, or do anything that needs mental  alertness until you know how this medicine affects you. Do not stand or sit up quickly, especially if you are an older patient. This reduces the risk of dizzy or fainting spells. Alcohol may interfere with the effect of this medicine. Avoid alcoholic drinks. Your mouth may get dry. Chewing sugarless gum or sucking hard candy, and drinking plenty of water may help. Contact your doctor if the problem does not go away or is severe. This  medicine may cause dry eyes and blurred vision. If you wear contact lenses you may feel some discomfort. Lubricating drops may help. See your eye doctor if the problem does not go away or is severe. What side effects may I notice from receiving this medicine? Side effects that you should report to your doctor or health care professional as soon as possible: -feeling faint or lightheaded, falls -fast, irregular heartbeat Side effects that usually do not require medical attention (report these to your doctor or health care professional if they continue or are bothersome): -constipation -headache -trouble passing urine or change in the amount of urine -trouble sleeping -upset stomach This list may not describe all possible side effects. Call your doctor for medical advice about side effects. You may report side effects to FDA at 1-800-FDA-1088. Where should I keep my medicine? Keep out of the reach of children. Store at room temperature between 15 and 30 degrees C (59 and 86 degrees F). Keep container tightly closed. Throw away any unused medicine after the expiration date. NOTE: This sheet is a summary. It may not cover all possible information. If you have questions about this medicine, talk to your doctor, pharmacist, or health care provider.    2016, Elsevier/Gold Standard. (2014-07-29 09:20:57)

## 2015-04-05 ENCOUNTER — Telehealth (HOSPITAL_COMMUNITY): Payer: Self-pay | Admitting: Vascular Surgery

## 2015-04-05 NOTE — Telephone Encounter (Signed)
Returned pt call to make f/u adppt w/ DB

## 2015-05-17 ENCOUNTER — Ambulatory Visit (HOSPITAL_COMMUNITY)
Admission: RE | Admit: 2015-05-17 | Discharge: 2015-05-17 | Disposition: A | Discharge status: Home / Self Care | Payer: Medicare Other | Source: Ambulatory Visit | Attending: Internal Medicine | Admitting: Internal Medicine

## 2015-05-17 VITALS — BP 118/58 | HR 84 | Wt 243.5 lb

## 2015-05-17 DIAGNOSIS — I739 Peripheral vascular disease, unspecified: Secondary | ICD-10-CM | POA: Diagnosis not present

## 2015-05-17 DIAGNOSIS — I6523 Occlusion and stenosis of bilateral carotid arteries: Secondary | ICD-10-CM

## 2015-05-17 DIAGNOSIS — Z7902 Long term (current) use of antithrombotics/antiplatelets: Secondary | ICD-10-CM | POA: Diagnosis not present

## 2015-05-17 DIAGNOSIS — R5383 Other fatigue: Secondary | ICD-10-CM | POA: Diagnosis not present

## 2015-05-17 DIAGNOSIS — I2510 Atherosclerotic heart disease of native coronary artery without angina pectoris: Secondary | ICD-10-CM | POA: Insufficient documentation

## 2015-05-17 DIAGNOSIS — I6529 Occlusion and stenosis of unspecified carotid artery: Secondary | ICD-10-CM | POA: Insufficient documentation

## 2015-05-17 DIAGNOSIS — E785 Hyperlipidemia, unspecified: Secondary | ICD-10-CM | POA: Diagnosis not present

## 2015-05-17 DIAGNOSIS — Z79899 Other long term (current) drug therapy: Secondary | ICD-10-CM | POA: Diagnosis not present

## 2015-05-17 DIAGNOSIS — Z9641 Presence of insulin pump (external) (internal): Secondary | ICD-10-CM | POA: Insufficient documentation

## 2015-05-17 DIAGNOSIS — Z951 Presence of aortocoronary bypass graft: Secondary | ICD-10-CM | POA: Diagnosis not present

## 2015-05-17 DIAGNOSIS — I10 Essential (primary) hypertension: Secondary | ICD-10-CM | POA: Diagnosis not present

## 2015-05-17 DIAGNOSIS — I252 Old myocardial infarction: Secondary | ICD-10-CM | POA: Diagnosis not present

## 2015-05-17 DIAGNOSIS — E109 Type 1 diabetes mellitus without complications: Secondary | ICD-10-CM | POA: Insufficient documentation

## 2015-05-17 DIAGNOSIS — K219 Gastro-esophageal reflux disease without esophagitis: Secondary | ICD-10-CM | POA: Diagnosis not present

## 2015-05-17 DIAGNOSIS — Z7982 Long term (current) use of aspirin: Secondary | ICD-10-CM | POA: Insufficient documentation

## 2015-05-17 NOTE — Progress Notes (Signed)
Patient ID: Derek Hogan, male   DOB: 16-Jul-1945, 70 y.o.   MRN: [id number]    Advanced Heart Failure Clinic Note  Primary HF: Dr. Haroldine Laws   HPI: Derek Hogan is a delightful 70 year old male with a history of CAD S/P previous inferior wall myocardial infarction followed by CABG 1996. Last cardiac catheterization was in Nov 2010 for Myoview with mild inferior ischemia. This showed three-vessel coronary artery disease with patent grafts. There was significant lesion in distal RCA after the graft insertion which compromised flow to distal PL. There was also some mild flow to this region antegrade via native vessel. I reviewed with Dr. Burt Knack and while ths area may be a source of mild ischemia we felt that trying to angioplasty the distal native RCA might compromise the flow in the SVG-PDA via competitive flow. Thus we elected to continue medical management.     Admitted to [facility] in 6/12 for CP and presyncope. BP was low. Given IVFs. Lisinopril cut back to 2.5 daily and Atenolol stopped. Cardiac markers negative.  F/u Myoview EF 50%, inferior infarct, no ischemia.   Carotids 05/27/13: 0-39% bilaterally Echo 12/14: EF 60-65% moderate LAE CPX in 12/14 showed RER 1.21, peak VO2 22.5, VE/VCO2 slope 25.9. Myoview 10/15 EF 47% Fixed defect inferiorly  Echo at [facility] EF > 55%   He reports today for 6 month follow up: Has been feeling a little more tired than usual.  Thinks he has less "get up and go". Still working out on elliptical 5 days a week, but has been doing less the past week. Legs get tired after working, denies pain, just a lot of fatigue. Is tired after 18 holes of golf. Occasionally lightheaded, but no clear aggravating factor. Breathing has been fine.  No DOE with working out, does get SOB with hills. OK on stairs. Weight stable. Doesn't check his weight at home.     ROS: All systems negative except as listed in HPI, PMH and Problem List.  Labs (9/14): K 4.4, creatinine 0.86, LDL 75, HDL  49 Labs (2/15): K 4.8, creatinine 0.9, HDL 39, LDL 76 Labs (8/15): TC 127 TG 58 LDL 73 HDL 47 Labs (3/16): creatinine1.04 Labs (2/17):  Past Medical History  Diagnosis Date  . Hypertension   . UNSPECIFIED PERIPHERAL VASCULAR DISEASE 10/06/2008  . HYPERTENSION, UNSPECIFIED 06/09/2008  . HYPERLIPIDEMIA-MIXED 06/09/2008  . ERECTILE DYSFUNCTION 06/09/2008  . Hypotension   . Viral gastroenteritis     04/19/2010 improved  . Dyslipidemia   . GERD (gastroesophageal reflux disease)   . Weakness   . PVD (peripheral vascular disease) ([place])     -- s/p right carotid endarterectomy by Dr. Drucie Opitz -- carotid u/s 0-39% (9/10) -- ABI L 0.95 R 1.0 (2/07), LE angiography by Dr. Fletcher Anon 11/9 stent to R pop artery  . Erectile dysfunction   . CAD, ARTERY BYPASS GRAFT 06/09/2008  . Heart attack ([place]) 1996  . Type I diabetes mellitus ([place]) dx'd 08/20/1966  . Orthostatic hypotension     positive by orthostatic vital signs, encourage IV hydration    Current Outpatient Prescriptions  Medication Sig Dispense Refill  . aspirin 162 MG EC tablet Take 162 mg by mouth daily.    Marland Kitchen atorvastatin (LIPITOR) 80 MG tablet Take 1 tablet (80 mg total) by mouth daily.    . clopidogrel (PLAVIX) 75 MG tablet Take 1 tablet (75 mg total) by mouth daily. 30 tablet 6  . insulin aspart (NOVOLOG) 100 unit/mL injection Inject into the skin continuous.  Insulin Pump    . lisinopril (PRINIVIL,ZESTRIL) 2.5 MG tablet Take 2.5 mg by mouth at bedtime.     . nitroGLYCERIN (NITROSTAT) 0.4 MG SL tablet Place 0.4 mg under the tongue every 5 (five) minutes as needed for chest pain.    Marland Kitchen omeprazole (PRILOSEC) 20 MG capsule Take 20 mg by mouth daily.      Marland Kitchen zolpidem (AMBIEN) 10 MG tablet Take 10 mg by mouth at bedtime as needed for sleep.     No current facility-administered medications for this encounter.    PHYSICAL EXAM: Filed Vitals:   05/17/15 1346  BP: 118/58  Pulse: 84  Weight: 243 lb 8 oz (110.451 kg)  SpO2: 95%   Wt Readings from  Last 3 Encounters:  05/17/15 243 lb 8 oz (110.451 kg)  03/16/15 244 lb (110.678 kg)  01/06/15 245 lb 4 oz (111.245 kg)     General: Well appearing. No resp difficulty.  HEENT: normal Neck: supple. JVP flat. CEA scar. Carotids 2+ bilaterally; right bruit. No thyromegaly or nodule noted Cor: PMI normal. Regular rate & rhythm. No rubs, gallops or murmurs. Lungs: CTAB, normal effort Abdomen: Obese, soft, NT, ND, no HSM. No bruits or masses. +BS  Extremities: no cyanosis, clubbing, rash, edema Neuro: alert & orientedx3, cranial nerves grossly intact. Moves all 4 extremities w/o difficulty. Affect pleasant. FTN ok. No pronator drift  ASSESSMENT & PLAN:  1) CAD, s/p CABG 1996 2) HTN:  - Continue lisinopril 2.5 mg at bedtime 3) HLD: Good lipids in 6/16, continue atorvastatin 80 mg daily 4) Carotid stenosis: s/p CEA.  Mild disease on [place] 12/2014.  Volume status stable on exam.    With his fatigue and CAD, may benefit from Echo. Last done in 2014.  Shirley Friar, PA-C 05/17/2015   Patient seen and examined with Oda Kilts, PA-C. We discussed all aspects of the encounter. I agree with the assessment and plan as stated above.   He continues to do very well. No HF or angina. BP looks good. Carotid u/s look good. Will see back in 12 months. Repeat echo.   Mckaela Howley,MD 3:24 PM

## 2015-05-17 NOTE — Progress Notes (Signed)
Advanced Heart Failure Medication Review by a Pharmacist  Does the patient  feel that his/her medications are working for him/her?  yes  Has the patient been experiencing any side effects to the medications prescribed?  no  Does the patient measure his/her own blood pressure or blood glucose at home?  no   Does the patient have any problems obtaining medications due to transportation or finances?   no  Understanding of regimen: good Understanding of indications: good Potential of compliance: good Patient understands to avoid NSAIDs. Patient understands to avoid decongestants.   Pharmacist comments: Reports taking all morning doses prior to visit today. Denies missed doses or adverse effects. No complaints or concerns at the present time. All specific medication questions addressedd.   Time with patient: 5 min Preparation and documentation time: 5 min Total time: 10 min  Stephens November, PharmD Clinical Pharmacy Resident 2:06 PM, 05/17/2015

## 2015-05-17 NOTE — Patient Instructions (Signed)
Your physician has requested that you have an echocardiogram. Echocardiography is a painless test that uses sound waves to create images of your heart. It provides your doctor with information about the size and shape of your heart and how well your heart's chambers and valves are working. This procedure takes approximately one hour. There are no restrictions for this procedure.  We will contact you in 1 year to schedule your next appointment.  

## 2015-05-23 ENCOUNTER — Other Ambulatory Visit: Payer: Self-pay | Admitting: Cardiovascular Disease

## 2015-05-23 DIAGNOSIS — I739 Peripheral vascular disease, unspecified: Secondary | ICD-10-CM

## 2015-06-06 ENCOUNTER — Other Ambulatory Visit: Payer: Self-pay

## 2015-06-06 ENCOUNTER — Ambulatory Visit (INDEPENDENT_AMBULATORY_CARE_PROVIDER_SITE_OTHER): Payer: Medicare Other

## 2015-06-06 ENCOUNTER — Ambulatory Visit: Payer: Medicare Other

## 2015-06-06 ENCOUNTER — Telehealth: Payer: Self-pay | Admitting: Cardiovascular Disease

## 2015-06-06 DIAGNOSIS — I2510 Atherosclerotic heart disease of native coronary artery without angina pectoris: Secondary | ICD-10-CM

## 2015-06-06 DIAGNOSIS — I6523 Occlusion and stenosis of bilateral carotid arteries: Secondary | ICD-10-CM

## 2015-06-06 DIAGNOSIS — I739 Peripheral vascular disease, unspecified: Secondary | ICD-10-CM

## 2015-06-06 NOTE — Telephone Encounter (Signed)
S/w pt who understands reports will be mailed to his home as soon as available.

## 2015-06-06 NOTE — Telephone Encounter (Signed)
Patient wants reports and cd images of tests done today LE arterial and echo.  [facility]   Please mail these to home address.   ROI signed and scanned to documents.

## 2015-06-07 DIAGNOSIS — L570 Actinic keratosis: Secondary | ICD-10-CM | POA: Diagnosis not present

## 2015-06-07 DIAGNOSIS — L821 Other seborrheic keratosis: Secondary | ICD-10-CM | POA: Diagnosis not present

## 2015-06-07 DIAGNOSIS — D0462 Carcinoma in situ of skin of left upper limb, including shoulder: Secondary | ICD-10-CM | POA: Diagnosis not present

## 2015-06-07 DIAGNOSIS — C44629 Squamous cell carcinoma of skin of left upper limb, including shoulder: Secondary | ICD-10-CM | POA: Diagnosis not present

## 2015-06-07 DIAGNOSIS — D485 Neoplasm of uncertain behavior of skin: Secondary | ICD-10-CM | POA: Diagnosis not present

## 2015-06-07 DIAGNOSIS — X32XXXA Exposure to sunlight, initial encounter: Secondary | ICD-10-CM | POA: Diagnosis not present

## 2015-06-09 ENCOUNTER — Other Ambulatory Visit: Payer: Self-pay

## 2015-06-09 DIAGNOSIS — I739 Peripheral vascular disease, unspecified: Secondary | ICD-10-CM

## 2015-06-17 ENCOUNTER — Telehealth: Payer: Self-pay

## 2015-06-17 NOTE — Telephone Encounter (Signed)
Notified Derek Hogan per Dr. Fletcher Anon, he can come off plavix now and that he doesn't need anymore.

## 2015-06-17 NOTE — Telephone Encounter (Signed)
He does not need to stay on Plavix anymore. We can just go ahead and stop it now.

## 2015-06-17 NOTE — Telephone Encounter (Signed)
Patient needs to have a squamous cell carcinoma removed, he would like to come off Plavix prior to the procedure.  Please advise how many days you think he should come off Plavix; DOS is Jun 23, 2015.

## 2015-06-21 DIAGNOSIS — I739 Peripheral vascular disease, unspecified: Secondary | ICD-10-CM | POA: Diagnosis not present

## 2015-06-21 DIAGNOSIS — E109 Type 1 diabetes mellitus without complications: Secondary | ICD-10-CM | POA: Diagnosis not present

## 2015-06-21 DIAGNOSIS — I10 Essential (primary) hypertension: Secondary | ICD-10-CM | POA: Diagnosis not present

## 2015-06-21 DIAGNOSIS — B9689 Other specified bacterial agents as the cause of diseases classified elsewhere: Secondary | ICD-10-CM | POA: Diagnosis not present

## 2015-06-21 DIAGNOSIS — I25118 Atherosclerotic heart disease of native coronary artery with other forms of angina pectoris: Secondary | ICD-10-CM | POA: Diagnosis not present

## 2015-06-21 DIAGNOSIS — J208 Acute bronchitis due to other specified organisms: Secondary | ICD-10-CM | POA: Diagnosis not present

## 2015-06-23 DIAGNOSIS — L905 Scar conditions and fibrosis of skin: Secondary | ICD-10-CM | POA: Diagnosis not present

## 2015-06-23 DIAGNOSIS — C44629 Squamous cell carcinoma of skin of left upper limb, including shoulder: Secondary | ICD-10-CM | POA: Diagnosis not present

## 2015-06-30 ENCOUNTER — Ambulatory Visit (INDEPENDENT_AMBULATORY_CARE_PROVIDER_SITE_OTHER): Payer: Medicare Other | Admitting: Cardiovascular Disease

## 2015-06-30 ENCOUNTER — Encounter: Payer: Self-pay | Admitting: Cardiovascular Disease

## 2015-06-30 VITALS — BP 138/66 | HR 76 | Ht 72.0 in | Wt 241.2 lb

## 2015-06-30 DIAGNOSIS — I6523 Occlusion and stenosis of bilateral carotid arteries: Secondary | ICD-10-CM | POA: Diagnosis not present

## 2015-06-30 DIAGNOSIS — I739 Peripheral vascular disease, unspecified: Secondary | ICD-10-CM

## 2015-06-30 DIAGNOSIS — I2510 Atherosclerotic heart disease of native coronary artery without angina pectoris: Secondary | ICD-10-CM | POA: Diagnosis not present

## 2015-06-30 DIAGNOSIS — I10 Essential (primary) hypertension: Secondary | ICD-10-CM | POA: Diagnosis not present

## 2015-06-30 NOTE — Progress Notes (Signed)
Cardiology Office Note   Date:  06/30/2015   ID:  [name], DOB October 12, 1945, MRN [id number]  PCP:  Derek Hogan., MD  Cardiologist:  Dr. Haroldine Laws  Chief Complaint  Patient presents with  . other    6 month f/u no complaints. Meds reviewed verbally with pt.      History of Present Illness: Derek Hogan is a 70 y.o. male who presents for a follow-up visit regarding peripheral arterial disease.  He has known history of CAD S/P previous inferior wall myocardial infarction followed by CABG 1996. He had questionable TIA in March 2016. He quit smoking in 1996 after CABG. He has prolonged history of diabetes currently on insulin pump, Carotid disease status post right carotid endarterectomy, hyperlipidemia and hypertension. He was seen last year for severe right calf claudication.  He underwent noninvasive vascular evaluation at the [facility]. ABI was 0.98 on the right and noncompressible on the left side. There was monophasic waveform in the popliteal artery compared to triphasic waveform in the common femoral artery. Angiography in 12/2014 showed occluded proximal right popliteal artery with reconstitution above the knee. This was treated successfully with drug-coated balloon angioplasty. He had a flow-limiting dissection which required placement of a self-expanding stent. He reports resolution of claudication since then. No chest pain or shortness of breath. He complains of occasional left calf discomfort but this is not consistent with exercise and usually is random.   Past Medical History  Diagnosis Date  . Hypertension   . UNSPECIFIED PERIPHERAL VASCULAR DISEASE 10/06/2008  . HYPERTENSION, UNSPECIFIED 06/09/2008  . HYPERLIPIDEMIA-MIXED 06/09/2008  . ERECTILE DYSFUNCTION 06/09/2008  . Hypotension   . Viral gastroenteritis     04/19/2010 improved  . Dyslipidemia   . GERD (gastroesophageal reflux disease)   . Weakness   . PVD (peripheral vascular disease) ([place])    -- s/p right carotid endarterectomy by Dr. Drucie Opitz -- carotid u/s 0-39% (9/10) -- ABI L 0.95 R 1.0 (2/07), LE angiography by Dr. Fletcher Anon 11/9 stent to R pop artery  . Erectile dysfunction   . CAD, ARTERY BYPASS GRAFT 06/09/2008  . Heart attack ([place]) 1996  . Type I diabetes mellitus ([place]) dx'd 08/20/1966  . Orthostatic hypotension     positive by orthostatic vital signs, encourage IV hydration    Past Surgical History  Procedure Laterality Date  . Refractive surgery Right     "before cataract OR"  . Carotid endarterectomy Right   . Coronary artery bypass graft  1996    "CABG X4"  . Cataract extraction, bilateral Bilateral   . Cardiac catheterization  1996; ~ 2010  . Coronary angioplasty    . Peripheral vascular catheterization N/A 12/15/2014    Procedure: Abdominal Aortogram;  Surgeon: Wellington Hampshire, MD;  Location: [place] [facility];  Service: Cardiovascular;  Laterality: N/A;  . Mohs surgery       Current Outpatient Prescriptions  Medication Sig Dispense Refill  . aspirin 162 MG EC tablet Take 162 mg by mouth daily.    Marland Kitchen atorvastatin (LIPITOR) 80 MG tablet Take 1 tablet (80 mg total) by mouth daily.    . insulin aspart (NOVOLOG) 100 unit/mL injection Inject into the skin continuous. Insulin Pump    . lisinopril (PRINIVIL,ZESTRIL) 2.5 MG tablet Take 2.5 mg by mouth at bedtime.     . nitroGLYCERIN (NITROSTAT) 0.4 MG SL tablet Place 0.4 mg under the tongue every 5 (five) minutes as needed for chest pain.    Marland Kitchen  omeprazole (PRILOSEC) 20 MG capsule Take 20 mg by mouth daily.      Marland Kitchen zolpidem (AMBIEN) 10 MG tablet Take 10 mg by mouth at bedtime as needed for sleep.     No current facility-administered medications for this visit.    Allergies:   Review of patient's allergies indicates no known allergies.    Social History:  The patient  reports that he quit smoking about 21 years ago. His smoking use included Cigarettes. He has a 87.5 pack-year smoking history. He has never used  smokeless tobacco. He reports that he drinks alcohol. He reports that he does not use illicit drugs.   Family History:  The patient's family history includes Coronary artery disease in his father and mother; Diabetes in his father and mother.    ROS:  Please see the history of present illness.   Otherwise, review of systems are positive for none.   All other systems are reviewed and negative.    PHYSICAL EXAM: VS:  BP 138/66 mmHg  Ht 6' (1.829 m)  Wt 241 lb 4 oz (109.43 kg)  BMI 32.71 kg/m2 , BMI Body mass index is 32.71 kg/(m^2). GEN: Well nourished, well developed, in no acute distress HEENT: normal Neck: no JVD, or masses. Right carotid bruit Cardiac: RRR; no murmurs, rubs, or gallops,no edema  Respiratory:  clear to auscultation bilaterally, normal work of breathing GI: soft, nontender, nondistended, + BS MS: no deformity or atrophy Skin: warm and dry, no rash Neuro:  Strength and sensation are intact Psych: euthymic mood, full affect   EKG:  EKG is not ordered today.   Recent Labs: 03/16/2015: ALT 23; BUN 11; Creatinine, Ser 1.02; Hemoglobin 14.4; Platelets 232; Potassium 4.0; Sodium 142    Lipid Panel    Component Value Date/Time   CHOL 116 07/08/2014 0830   CHOL 126 07/19/2010 0839   TRIG 51 07/08/2014 0830   HDL 43 07/08/2014 0830   HDL 47 07/19/2010 0839   CHOLHDL 2.7 07/08/2014 0830   CHOLHDL 2.7 07/19/2010 0839   VLDL 10 07/19/2010 0839   LDLCALC 63 07/08/2014 0830   LDLCALC 69 07/19/2010 0839      Wt Readings from Last 3 Encounters:  06/30/15 241 lb 4 oz (109.43 kg)  05/17/15 243 lb 8 oz (110.451 kg)  03/16/15 244 lb (110.678 kg)        ASSESSMENT AND PLAN:  1.  Peripheral arterial disease: Status post angioplasty and stent placement to the right popliteal artery. No claudication since then. Recent noninvasive evaluation showed normal ABI and patent right popliteal artery stent. Repeat studies in 6 months. There was mild disease affecting the left  popliteal artery .   2. Coronary artery disease involving native coronary arteries without angina: He continues to be asymptomatic. Continue medical therapy.  3. Hyperlipidemia: Continue treatment with high dose atorvastatin with a target LDL of less than 70. Most recent lipid profile in December 2016 showed an LDL of 82. Hemoglobin A1c was 7.5.  4. Carotid disease status post right carotid endarterectomy: Most recent duplex in November 2016 showed mild disease on the right.   Disposition:   FU with me in 6 months  Signed,  Kathlyn Sacramento, MD  06/30/2015 10:02 AM    [place]

## 2015-06-30 NOTE — Patient Instructions (Signed)
Medication Instructions:  Your physician recommends that you continue on your current medications as directed. Please refer to the Current Medication list given to you today.   Labwork: none  Testing/Procedures: Your physician has requested that you have a lower extremity arterial exercise duplex in 6 months. During this test, exercise and ultrasound are used to evaluate arterial blood flow in the legs. Allow one hour for this exam. There are no restrictions or special instructions.  Follow-Up: Your physician wants you to follow-up in: six months with Dr. Fletcher Anon.  You will receive a reminder letter in the mail two months in advance. If you don't receive a letter, please call our office to schedule the follow-up appointment.   Any Other Special Instructions Will Be Listed Below (If Applicable).     If you need a refill on your cardiac medications before your next appointment, please call your pharmacy.

## 2015-07-07 DIAGNOSIS — D0462 Carcinoma in situ of skin of left upper limb, including shoulder: Secondary | ICD-10-CM | POA: Diagnosis not present

## 2015-10-24 ENCOUNTER — Emergency Department
Admission: EM | Admit: 2015-10-24 | Discharge: 2015-10-24 | Disposition: A | Discharge status: Home / Self Care | Payer: Medicare Other | Attending: Emergency Medicine | Admitting: Emergency Medicine

## 2015-10-24 ENCOUNTER — Telehealth: Payer: Self-pay | Admitting: Cardiovascular Disease

## 2015-10-24 ENCOUNTER — Encounter (HOSPITAL_COMMUNITY): Payer: Self-pay

## 2015-10-24 ENCOUNTER — Emergency Department: Payer: Medicare Other

## 2015-10-24 ENCOUNTER — Encounter: Payer: Self-pay | Admitting: Medical Oncology

## 2015-10-24 DIAGNOSIS — Z87891 Personal history of nicotine dependence: Secondary | ICD-10-CM | POA: Insufficient documentation

## 2015-10-24 DIAGNOSIS — R079 Chest pain, unspecified: Secondary | ICD-10-CM | POA: Insufficient documentation

## 2015-10-24 DIAGNOSIS — Z7982 Long term (current) use of aspirin: Secondary | ICD-10-CM | POA: Insufficient documentation

## 2015-10-24 DIAGNOSIS — Z951 Presence of aortocoronary bypass graft: Secondary | ICD-10-CM | POA: Insufficient documentation

## 2015-10-24 DIAGNOSIS — Z79899 Other long term (current) drug therapy: Secondary | ICD-10-CM | POA: Insufficient documentation

## 2015-10-24 DIAGNOSIS — I10 Essential (primary) hypertension: Secondary | ICD-10-CM | POA: Diagnosis not present

## 2015-10-24 DIAGNOSIS — E119 Type 2 diabetes mellitus without complications: Secondary | ICD-10-CM | POA: Insufficient documentation

## 2015-10-24 DIAGNOSIS — R0602 Shortness of breath: Secondary | ICD-10-CM | POA: Diagnosis not present

## 2015-10-24 DIAGNOSIS — I2510 Atherosclerotic heart disease of native coronary artery without angina pectoris: Secondary | ICD-10-CM | POA: Insufficient documentation

## 2015-10-24 DIAGNOSIS — R0789 Other chest pain: Secondary | ICD-10-CM | POA: Diagnosis not present

## 2015-10-24 LAB — CBC
HCT: 43.9 % (ref 40.0–52.0)
HEMOGLOBIN: 15 g/dL (ref 13.0–18.0)
MCH: 31.3 pg (ref 26.0–34.0)
MCHC: 34.2 g/dL (ref 32.0–36.0)
MCV: 91.6 fL (ref 80.0–100.0)
Platelets: 216 10*3/uL (ref 150–440)
RBC: 4.79 MIL/uL (ref 4.40–5.90)
RDW: 13.6 % (ref 11.5–14.5)
WBC: 11.3 10*3/uL — AB (ref 3.8–10.6)

## 2015-10-24 LAB — BASIC METABOLIC PANEL
ANION GAP: 6 (ref 5–15)
BUN: 12 mg/dL (ref 6–20)
CALCIUM: 9.3 mg/dL (ref 8.9–10.3)
CO2: 26 mmol/L (ref 22–32)
Chloride: 106 mmol/L (ref 101–111)
Creatinine, Ser: 1 mg/dL (ref 0.61–1.24)
GFR calc non Af Amer: >60 mL/min (ref >60)
Glucose, Bld: 137 mg/dL — ABNORMAL HIGH (ref 65–99)
Potassium: 4.7 mmol/L (ref 3.5–5.1)
SODIUM: 138 mmol/L (ref 135–145)

## 2015-10-24 LAB — GLUCOSE, CAPILLARY: GLUCOSE-CAPILLARY: 133 mg/dL — AB (ref 65–99)

## 2015-10-24 LAB — TROPONIN I: Troponin I: <0.03 ng/mL (ref <0.03)

## 2015-10-24 MED ORDER — NITROGLYCERIN 0.4 MG SL SUBL: 0.4000 mg | SUBLINGUAL_TABLET | SUBLINGUAL | 3 refills | Status: DC | PRN

## 2015-10-24 NOTE — ED Notes (Signed)
Pt c/o intermittent chest pain/heart burn throughout the day with accompanying SOB. Pt denies current SOB/chest pain/heart burn

## 2015-10-24 NOTE — ED Notes (Signed)
MD Williams at bedside.  

## 2015-10-24 NOTE — ED Notes (Signed)
Patient is resting comfortably. 

## 2015-10-24 NOTE — ED Provider Notes (Signed)
[facility] Emergency Department Provider Note        Time seen: ----------------------------------------- 5:48 PM on 10/24/2015 -----------------------------------------    I have reviewed the triage vital signs and the nursing notes.   HISTORY  Chief Complaint Heartburn; Chest Pain; and Fatigue    HPI Derek Hogan is a 70 y.o. male who presents to ER for symptoms of "acid reflux" for the past few weeks. Patient states today he began having chest heaviness and states his been feeling more fatigued than usual. Patient was playing golf in [place] with his wife when he became confused, agitated, short of breath and flushed and overall felt like something wasn't right. His indigestion symptoms were worse today, his blood sugar was elevated this morning. Patient was instructed to come to the ER for evaluation.   Past Medical History:  Diagnosis Date  . CAD, ARTERY BYPASS GRAFT 06/09/2008  . Dyslipidemia   . ERECTILE DYSFUNCTION 06/09/2008  . Erectile dysfunction   . GERD (gastroesophageal reflux disease)   . Heart attack ([place]) 1996  . HYPERLIPIDEMIA-MIXED 06/09/2008  . Hypertension   . HYPERTENSION, UNSPECIFIED 06/09/2008  . Hypotension   . Orthostatic hypotension    positive by orthostatic vital signs, encourage IV hydration  . PVD (peripheral vascular disease) ([place])    -- s/p right carotid endarterectomy by Dr. Drucie Opitz -- carotid u/s 0-39% (9/10) -- ABI L 0.95 R 1.0 (2/07), LE angiography by Dr. Fletcher Anon 11/9 stent to R pop artery  . Type I diabetes mellitus ([place]) dx'd 08/20/1966  . UNSPECIFIED PERIPHERAL VASCULAR DISEASE 10/06/2008  . Viral gastroenteritis    04/19/2010 improved  . Weakness     Patient Active Problem List   Diagnosis Date Noted  . Peripheral vascular angioplasty status   . PAD (peripheral artery disease) ([place]) 12/13/2014  . Malaise and fatigue 06/16/2012  . Cough 12/16/2011  . Precordial pain 01/05/2011  . Syncope 07/25/2010   . Leukocytosis 07/25/2010  . DIZZINESS 05/18/2009  . MYOCARDIAL PERFUSION SCAN, WITH STRESS TEST, ABNORMAL 12/20/2008  . SHORTNESS OF BREATH 11/25/2008  . Diabetes mellitus, insulin dependent (IDDM), controlled ([place]) 10/01/2008  . HYPOTENSION, ORTHOSTATIC 07/28/2008  . Hyperlipidemia LDL goal <70 06/09/2008  . ERECTILE DYSFUNCTION 06/09/2008  . Essential hypertension 06/09/2008  . Coronary atherosclerosis of native coronary artery 06/09/2008  . Carotid stenosis 06/09/2008  . HYPOTENSION, UNSPECIFIED 06/09/2008    Past Surgical History:  Procedure Laterality Date  . [place]; ~ 2010  . CAROTID ENDARTERECTOMY Right   . CATARACT EXTRACTION, BILATERAL Bilateral   . CORONARY ANGIOPLASTY    . CORONARY ARTERY BYPASS GRAFT  1996   "CABG X4"  . MOHS SURGERY    . PERIPHERAL VASCULAR CATHETERIZATION N/A 12/15/2014   Procedure: Abdominal Aortogram;  Surgeon: Wellington Hampshire, MD;  Location: [place] [facility];  Service: Cardiovascular;  Laterality: N/A;  . REFRACTIVE SURGERY Right    "before cataract OR"    Allergies Review of patient's allergies indicates no known allergies.  Social History Social History  Substance Use Topics  . Smoking status: Former Smoker    Packs/day: 2.50    Years: 35.00    Types: Cigarettes    Quit date: 02/05/1994  . Smokeless tobacco: Never Used  . Alcohol use Yes    Review of Systems Constitutional: Negative for fever. Cardiovascular: Positive for chest pain Respiratory: Positive for shortness of breath Gastrointestinal: Negative for abdominal pain, vomiting and diarrhea. Genitourinary: Negative for dysuria. Musculoskeletal: Negative for back pain.  Skin: Negative for rash. Neurological: Negative for headaches, focal weakness or numbness.  10-point ROS otherwise negative.  ____________________________________________   PHYSICAL EXAM:  VITAL SIGNS: ED Triage Vitals  Enc Vitals Group     BP 10/24/15 1552 (!) 146/62      Pulse Rate 10/24/15 1552 76     Resp 10/24/15 1552 18     Temp 10/24/15 1552 97.8 F (36.6 C)     Temp Source 10/24/15 1552 Oral     SpO2 10/24/15 1552 99 %     Weight 10/24/15 1550 244 lb (110.7 kg)     Height 10/24/15 1550 6' (1.829 m)     Head Circumference --      Peak Flow --      Pain Score 10/24/15 1550 4     Pain Loc --      Pain Edu? --      Excl. in [place]? --     Constitutional: Alert and oriented. Well appearing and in no distress. Eyes: Conjunctivae are normal. PERRL. Normal extraocular movements. ENT   Head: Normocephalic and atraumatic.   Nose: No congestion/rhinnorhea.   Mouth/Throat: Mucous membranes are moist.   Neck: No stridor. Cardiovascular: Normal rate, regular rhythm. No murmurs, rubs, or gallops. Respiratory: Normal respiratory effort without tachypnea nor retractions. Breath sounds are clear and equal bilaterally. No wheezes/rales/rhonchi. Gastrointestinal: Soft and nontender. Normal bowel sounds Musculoskeletal: Nontender with normal range of motion in all extremities. No lower extremity tenderness nor edema. Neurologic:  Normal speech and language. No gross focal neurologic deficits are appreciated.  Skin:  Skin is warm, dry and intact. No rash noted. Psychiatric: Mood and affect are normal. Speech and behavior are normal.  ____________________________________________  EKG: Interpreted by me. Sinus rhythm with a rate of 83 bpm, normal PR interval, normal QRS size, normal QT interval. Possible anterior and inferior infarct age and determinate  ____________________________________________  ED COURSE:  Pertinent labs & imaging results that were available during my care of the patient were reviewed by me and considered in my medical decision making (see chart for details). Clinical Course  Patient is in no distress, symptoms have resolved at the time my evaluation. We will assess with basic labs and  imaging.  Procedures ____________________________________________   LABS (pertinent positives/negatives)  Labs Reviewed  BASIC METABOLIC PANEL - Abnormal; Notable for the following:       Result Value   Glucose, Bld 137 (*)    All other components within normal limits  CBC - Abnormal; Notable for the following:    WBC 11.3 (*)    All other components within normal limits  GLUCOSE, CAPILLARY - Abnormal; Notable for the following:    Glucose-Capillary 133 (*)    All other components within normal limits  TROPONIN I    RADIOLOGY  Chest x-ray Is unremarkable ____________________________________________  FINAL ASSESSMENT AND PLAN  Chest pain  Plan: Patient with labs and imaging as dictated above. I'm concerned that the patient's symptoms represent unstable angina. I have advised the patient he needs to be admitted into the hospital and have a stress test to further delineate his symptoms. Patient has declined this understanding the risks, he is leaving [place]. I did discuss with Dr. Lovena Le who is on call for his cardiology group who states he will pass along this information to the patient's cardiologist.   Earleen Newport, MD   Note: This dictation was prepared with Dragon dictation. Any transcriptional errors that result from this process  are unintentional    Earleen Newport, MD 10/24/15 Vernelle Emerald

## 2015-10-24 NOTE — ED Notes (Signed)
Reviewed d/c instructions, follow-up care and prescription with pt. Pt verbalized understanding. Reviewed the fact that pt is leaving AMA. PT verbalized risks and understanding

## 2015-10-24 NOTE — Progress Notes (Signed)
Patient walked in to CHF clinic c/o SOB and "not feeling well today". States this started today, very moody and agitated and unsure why, SOB, and just not feeling right. Denies CP, dizziness, N/V.  Color appears flushed, patient seems agitated and SOB talking while triaging the patient. Advised to go to ED to be seen as CHF clinic is closed and no providers available today. Patient declines ED and wishes to go to [place] office to see his other Dr.  Rollene Fare to let this RN push patient to the car in wheelchair, patient declines as well.  Accompanied patient and wife to elevator to get back to car to be seen at [facility] office per patient request.  Advised if patient's s/s worsened during car ride to pull over and call 911.  Wife agreeable.  Renee Pain, RN

## 2015-10-24 NOTE — ED Triage Notes (Signed)
Pt reports he has been having "acid reflux" like pains for a few weeks, today he began having chest heaviness. Pt states that he has been feeling more fatigued than usual.

## 2015-10-24 NOTE — Telephone Encounter (Addendum)
Pt and his wife presented to the office this afternoon. Pt reports he was playing golf in [place] w/wife and became confused, agitated, SOB, flushed and overall felt that something was not right. He has had what he thinks to be indigestion x 2 weeks w/no relief from omeprazole. Indigestion was worse today. He now reports having some "chest heaviness". BS was over 500 this morning then dropped to 50s. He ate a sandwich and orange juice at home. He originally went to the [facility] CHF clinic in an attempt to see Dr. Haroldine Laws, was told no providers were there and instructed to go to the ER to be evaluated. He instead came to our office. He appears flushed and lethargic. Due to current sx and questionable TIA Nov 2016, I instructed pt to be seen at [facility] ER. I accompanied pt via wheelchair and his wife to the ER. Charge nurse was notified by Meriam Sprague.

## 2015-10-27 DIAGNOSIS — K219 Gastro-esophageal reflux disease without esophagitis: Secondary | ICD-10-CM | POA: Diagnosis not present

## 2015-10-27 DIAGNOSIS — I25118 Atherosclerotic heart disease of native coronary artery with other forms of angina pectoris: Secondary | ICD-10-CM | POA: Diagnosis not present

## 2015-10-27 DIAGNOSIS — Z23 Encounter for immunization: Secondary | ICD-10-CM | POA: Diagnosis not present

## 2015-11-07 ENCOUNTER — Ambulatory Visit (HOSPITAL_COMMUNITY)
Admission: RE | Admit: 2015-11-07 | Discharge: 2015-11-07 | Disposition: A | Discharge status: Home / Self Care | Payer: Medicare Other | Source: Ambulatory Visit | Attending: Internal Medicine | Admitting: Internal Medicine

## 2015-11-07 VITALS — BP 116/50 | HR 75 | Wt 244.2 lb

## 2015-11-07 DIAGNOSIS — Z7982 Long term (current) use of aspirin: Secondary | ICD-10-CM | POA: Insufficient documentation

## 2015-11-07 DIAGNOSIS — Z79899 Other long term (current) drug therapy: Secondary | ICD-10-CM | POA: Diagnosis not present

## 2015-11-07 DIAGNOSIS — E785 Hyperlipidemia, unspecified: Secondary | ICD-10-CM | POA: Diagnosis not present

## 2015-11-07 DIAGNOSIS — Z794 Long term (current) use of insulin: Secondary | ICD-10-CM | POA: Diagnosis not present

## 2015-11-07 DIAGNOSIS — I6529 Occlusion and stenosis of unspecified carotid artery: Secondary | ICD-10-CM | POA: Insufficient documentation

## 2015-11-07 DIAGNOSIS — I2510 Atherosclerotic heart disease of native coronary artery without angina pectoris: Secondary | ICD-10-CM | POA: Insufficient documentation

## 2015-11-07 DIAGNOSIS — Z951 Presence of aortocoronary bypass graft: Secondary | ICD-10-CM | POA: Diagnosis not present

## 2015-11-07 DIAGNOSIS — I6523 Occlusion and stenosis of bilateral carotid arteries: Secondary | ICD-10-CM | POA: Diagnosis not present

## 2015-11-07 DIAGNOSIS — I10 Essential (primary) hypertension: Secondary | ICD-10-CM | POA: Diagnosis not present

## 2015-11-07 DIAGNOSIS — I252 Old myocardial infarction: Secondary | ICD-10-CM | POA: Diagnosis not present

## 2015-11-07 NOTE — Progress Notes (Signed)
Patient ID: HABEN BILLINGSLEY, male   DOB: [birth year], 70 y.o.   MRN: [id number]    Advanced Heart Failure Clinic Note  Primary HF: Dr. Haroldine Laws   HPI: [name] is a delightful 70 year old male with a history of CAD S/P previous inferior wall myocardial infarction followed by CABG 1996. Last cardiac catheterization was in Nov 2010 for Myoview with mild inferior ischemia. This showed three-vessel coronary artery disease with patent grafts. There was significant lesion in distal RCA after the graft insertion which compromised flow to distal PL. There was also some mild flow to this region antegrade via native vessel. I reviewed with Dr. Burt Knack and while ths area may be a source of mild ischemia we felt that trying to angioplasty the distal native RCA might compromise the flow in the SVG-PDA via competitive flow. Thus we elected to continue medical management.     Admitted to [facility] in 6/12 for CP and presyncope. BP was low. Given IVFs. Lisinopril cut back to 2.5 daily and Atenolol stopped. Cardiac markers negative.  F/u Myoview EF 50%, inferior infarct, no ischemia.   Carotids 05/27/13: 0-39% bilaterally Echo 12/14: EF 60-65% moderate LAE CPX in 12/14 showed RER 1.21, peak VO2 22.5, VE/VCO2 slope 25.9. Myoview 10/15 EF 47% Fixed defect inferiorly  Echo at [facility] EF > 55%   He reports today for  follow up: Has been doing very well. Going to gym most days of the week doing elliptical and TM without much difficulty. Does 30 min on TM at 3mph no slope. No problem with that. 2 weeks ago was out playing golf and had indigestion. Then got very upset and had worsening indigestion and CP and SOB. Went to ER and troponin and ECG were ok. Feels great now.     ROS: All systems negative except as listed in HPI, PMH and Problem List.  Labs (9/14): K 4.4, creatinine 0.86, LDL 75, HDL 49 Labs (2/15): K 4.8, creatinine 0.9, HDL 39, LDL 76 Labs (8/15): TC 127 TG 58 LDL 73 HDL 47 Labs (3/16): creatinine1.04 Labs  (2/17):  Past Medical History:  Diagnosis Date  . CAD, ARTERY BYPASS GRAFT 06/09/2008  . Dyslipidemia   . ERECTILE DYSFUNCTION 06/09/2008  . Erectile dysfunction   . GERD (gastroesophageal reflux disease)   . Heart attack 1996  . HYPERLIPIDEMIA-MIXED 06/09/2008  . Hypertension   . HYPERTENSION, UNSPECIFIED 06/09/2008  . Hypotension   . Orthostatic hypotension    positive by orthostatic vital signs, encourage IV hydration  . PVD (peripheral vascular disease) ([place])    -- s/p right carotid endarterectomy by Dr. Drucie Opitz -- carotid u/s 0-39% (9/10) -- ABI L 0.95 R 1.0 (2/07), LE angiography by Dr. Fletcher Anon 11/9 stent to R pop artery  . Type I diabetes mellitus ([place]) dx'd 08/20/1966  . UNSPECIFIED PERIPHERAL VASCULAR DISEASE 10/06/2008  . Viral gastroenteritis    04/19/2010 improved  . Weakness     Current Outpatient Prescriptions  Medication Sig Dispense Refill  . aspirin 162 MG EC tablet Take 162 mg by mouth daily.    Marland Kitchen atorvastatin (LIPITOR) 80 MG tablet Take 1 tablet (80 mg total) by mouth daily.    . insulin aspart (NOVOLOG) 100 unit/mL injection Inject into the skin continuous. Insulin Pump    . lisinopril (PRINIVIL,ZESTRIL) 2.5 MG tablet Take 2.5 mg by mouth at bedtime.     . nitroGLYCERIN (NITROSTAT) 0.4 MG SL tablet Place 0.4 mg under the tongue every 5 (five) minutes as needed for chest pain.    Marland Kitchen  omeprazole (PRILOSEC) 20 MG capsule Take 20 mg by mouth daily.      . sertraline (ZOLOFT) 50 MG tablet Take 50 mg by mouth daily.    Marland Kitchen zolpidem (AMBIEN) 10 MG tablet Take 10 mg by mouth at bedtime as needed for sleep.     No current facility-administered medications for this encounter.     PHYSICAL EXAM: Vitals:   11/07/15 1435  BP: (!) 116/50  Pulse: 75  SpO2: 97%  Weight: 244 lb 4 oz (110.8 kg)   Wt Readings from Last 3 Encounters:  11/07/15 244 lb 4 oz (110.8 kg)  10/24/15 244 lb (110.7 kg)  06/30/15 241 lb 4 oz (109.4 kg)     General: Well appearing. No resp difficulty.    HEENT: normal Neck: supple. JVP flat. CEA scar. Carotids 2+ bilaterally; right bruit. No thyromegaly or nodule noted Cor: PMI normal. Regular rate & rhythm. No rubs, gallops or murmurs. Lungs: CTAB, normal effort Abdomen: Obese, soft, NT, ND, no HSM. No bruits or masses. +BS  Extremities: no cyanosis, clubbing, rash, edema Neuro: alert & orientedx3, cranial nerves grossly intact. Moves all 4 extremities w/o difficulty. Affect pleasant.   ASSESSMENT & PLAN:  1) CAD, s/p CABG 1996 -He is doing very well. Recent episode of indigestion but ED work-up ok. We discussed repeating stress test but he remains active now without symptoms. Will defer for now. If symptoms return can proceed.  2) HTN:  - Well controlled. Continue current regimen.  3) HLD: Good lipids in 6/16, continue atorvastatin 80 mg daily 4) Carotid stenosis: s/p CEA.  Mild disease on [place] 12/2014.   Erin Obando,MD 3:20 PM

## 2015-11-07 NOTE — Patient Instructions (Signed)
Follow up in 6months with Dr.Bensimhon.  

## 2016-01-06 DIAGNOSIS — D225 Melanocytic nevi of trunk: Secondary | ICD-10-CM | POA: Diagnosis not present

## 2016-01-06 DIAGNOSIS — L821 Other seborrheic keratosis: Secondary | ICD-10-CM | POA: Diagnosis not present

## 2016-01-06 DIAGNOSIS — Z85828 Personal history of other malignant neoplasm of skin: Secondary | ICD-10-CM | POA: Diagnosis not present

## 2016-01-06 DIAGNOSIS — D2272 Melanocytic nevi of left lower limb, including hip: Secondary | ICD-10-CM | POA: Diagnosis not present

## 2016-03-12 DIAGNOSIS — J069 Acute upper respiratory infection, unspecified: Secondary | ICD-10-CM | POA: Diagnosis not present

## 2016-05-27 ENCOUNTER — Ambulatory Visit
Admission: EM | Admit: 2016-05-27 | Discharge: 2016-05-27 | Disposition: A | Discharge status: Home / Self Care | Payer: Medicare Other | Attending: Family Medicine | Admitting: Family Medicine

## 2016-05-27 ENCOUNTER — Encounter: Payer: Self-pay | Admitting: Emergency Medicine

## 2016-05-27 DIAGNOSIS — S61411A Laceration without foreign body of right hand, initial encounter: Secondary | ICD-10-CM

## 2016-05-27 DIAGNOSIS — S60221A Contusion of right hand, initial encounter: Secondary | ICD-10-CM

## 2016-05-27 NOTE — ED Triage Notes (Addendum)
Patient states that he hit his right hand on the lawn mower steering wheel today.  Patient c/o pain and skin tear on his right thumb.

## 2016-05-30 ENCOUNTER — Telehealth: Payer: Self-pay | Admitting: Cardiovascular Disease

## 2016-05-30 NOTE — Telephone Encounter (Signed)
lmov to schedule LEA + ABI

## 2016-06-04 NOTE — Telephone Encounter (Signed)
Scheduled 5/30  °

## 2016-06-05 ENCOUNTER — Telehealth (HOSPITAL_COMMUNITY): Payer: Self-pay | Admitting: *Deleted

## 2016-06-05 DIAGNOSIS — R0600 Dyspnea, unspecified: Secondary | ICD-10-CM

## 2016-06-05 DIAGNOSIS — R0609 Other forms of dyspnea: Principal | ICD-10-CM

## 2016-06-05 DIAGNOSIS — R0602 Shortness of breath: Secondary | ICD-10-CM

## 2016-06-05 NOTE — Telephone Encounter (Signed)
Patient called and left message on triage line saying Dr. Haroldine Laws wants him to have a stress test.  I spoke with Dr. Haroldine Laws and he has ordered for patient to have a lexiscan myocardial perfusion test.  Order placed and message routed to scheduling to schedule stress test.

## 2016-06-06 ENCOUNTER — Telehealth (HOSPITAL_COMMUNITY): Payer: Self-pay | Admitting: *Deleted

## 2016-06-06 NOTE — Telephone Encounter (Signed)
Left message on voicemail per DPR in reference to upcoming appointment scheduled on 06/07/16 with detailed instructions given per Myocardial Perfusion Study Information Sheet for the test. LM to arrive 15 minutes early, and that it is imperative to arrive on time for appointment to keep from having the test rescheduled. If you need to cancel or reschedule your appointment, please call the office within 24 hours of your appointment. Failure to do so may result in a cancellation of your appointment, and a $50 no show fee. Phone number given for call back for any questions. Kirstie Peri

## 2016-06-07 ENCOUNTER — Ambulatory Visit (HOSPITAL_COMMUNITY): Payer: Medicare Other | Attending: Cardiology

## 2016-06-07 DIAGNOSIS — R0602 Shortness of breath: Secondary | ICD-10-CM

## 2016-06-07 DIAGNOSIS — I510 Cardiac septal defect, acquired: Secondary | ICD-10-CM | POA: Diagnosis not present

## 2016-06-07 DIAGNOSIS — I259 Chronic ischemic heart disease, unspecified: Secondary | ICD-10-CM | POA: Insufficient documentation

## 2016-06-07 DIAGNOSIS — R0600 Dyspnea, unspecified: Secondary | ICD-10-CM

## 2016-06-07 DIAGNOSIS — R9439 Abnormal result of other cardiovascular function study: Secondary | ICD-10-CM | POA: Diagnosis not present

## 2016-06-07 DIAGNOSIS — R0609 Other forms of dyspnea: Secondary | ICD-10-CM

## 2016-06-07 LAB — MYOCARDIAL PERFUSION IMAGING
CHL CUP NUCLEAR SDS: 4
CHL CUP RESTING HR STRESS: 67 {beats}/min
CSEPPHR: 88 {beats}/min
LVDIAVOL: 144 mL (ref 62–150)
LVSYSVOL: 77 mL
RATE: 0.37
SRS: 8
SSS: 12
TID: 1.08

## 2016-06-07 MED ORDER — TECHNETIUM TC 99M TETROFOSMIN IV KIT
32.8000 | PACK | Freq: Once | INTRAVENOUS | Status: AC | PRN
  Administered 2016-06-07: 32.8 via INTRAVENOUS
  Filled 2016-06-07: qty 33

## 2016-06-07 MED ORDER — TECHNETIUM TC 99M TETROFOSMIN IV KIT
10.3000 | PACK | Freq: Once | INTRAVENOUS | Status: AC | PRN
  Administered 2016-06-07: 10.3 via INTRAVENOUS
  Filled 2016-06-07: qty 11

## 2016-06-07 MED ORDER — REGADENOSON 0.4 MG/5ML IV SOLN
0.4000 mg | Freq: Once | INTRAVENOUS | Status: AC
Start: 2016-06-07 — End: 2016-06-07
  Administered 2016-06-07: 0.4 mg via INTRAVENOUS

## 2016-06-13 ENCOUNTER — Encounter (HOSPITAL_COMMUNITY): Payer: Medicare Other

## 2016-07-04 ENCOUNTER — Ambulatory Visit: Payer: Medicare Other

## 2016-07-04 DIAGNOSIS — I739 Peripheral vascular disease, unspecified: Secondary | ICD-10-CM

## 2016-07-04 LAB — VAS US LOWER EXTREMITY ARTERIAL DUPLEX
LEFT POPLITEAL PROX DYS VEL: 0 cm/s
LEFT SFA DIST DYS VEL: -3 cm/s
LPOPDPSV: -69 cm/s
LPOPPPSV: 237 cm/s
LSFAMIDVEL: 0 cm/s
LSFAPROXDYS: 0 cm/s
LSFDPSV: -199 cm/s
Left super femoral mid sys PSV: -109 cm/s
Left super femoral prox sys PSV: 185 cm/s
RATIBMIDSYS: 72 cm/s
RIGHT POPLITEAL DIST EDV: 0 cm/s
RIGHT POPLITEAL PROX EDV: 0 cm/s
RPOPDPSV: -152 cm/s
RSFMPSV: -137 cm/s
Right popliteal prox sys PSV: 93 cm/s
Right super femoral dist sys PSV: -181 cm/s
Right super femoral prox sys PSV: 166 cm/s

## 2016-07-06 ENCOUNTER — Other Ambulatory Visit: Payer: Self-pay

## 2016-07-06 DIAGNOSIS — I739 Peripheral vascular disease, unspecified: Secondary | ICD-10-CM

## 2016-07-17 NOTE — ED Provider Notes (Signed)
[facility]    CSN: [id number] Arrival date & time: 05/27/16  1450     History   Chief Complaint Chief Complaint  Patient presents with  . Hand Injury    right  . Laceration    skin tear    HPI Derek Hogan is a 71 y.o. male.   71 yo male with a c/o right hand skin tear and pain after hitting his right hand on the lawn mower steering wheel today. States he cleaned the area and bleeding is controlled. Tetanus up to date.     Hand Injury  Laceration    Past Medical History:  Diagnosis Date  . CAD, ARTERY BYPASS GRAFT 06/09/2008  . Dyslipidemia   . ERECTILE DYSFUNCTION 06/09/2008  . Erectile dysfunction   . GERD (gastroesophageal reflux disease)   . Heart attack ([place]) 1996  . HYPERLIPIDEMIA-MIXED 06/09/2008  . Hypertension   . HYPERTENSION, UNSPECIFIED 06/09/2008  . Hypotension   . Orthostatic hypotension    positive by orthostatic vital signs, encourage IV hydration  . PVD (peripheral vascular disease) ([place])    -- s/p right carotid endarterectomy by Dr. Drucie Opitz -- carotid u/s 0-39% (9/10) -- ABI L 0.95 R 1.0 (2/07), LE angiography by Dr. Fletcher Anon 11/9 stent to R pop artery  . Type I diabetes mellitus ([place]) dx'd 08/20/1966  . UNSPECIFIED PERIPHERAL VASCULAR DISEASE 10/06/2008  . Viral gastroenteritis    04/19/2010 improved  . Weakness     Patient Active Problem List   Diagnosis Date Noted  . Peripheral vascular angioplasty status   . PAD (peripheral artery disease) ([place]) 12/13/2014  . Malaise and fatigue 06/16/2012  . Cough 12/16/2011  . Precordial pain 01/05/2011  . Syncope 07/25/2010  . Leukocytosis 07/25/2010  . DIZZINESS 05/18/2009  . MYOCARDIAL PERFUSION SCAN, WITH STRESS TEST, ABNORMAL 12/20/2008  . SHORTNESS OF BREATH 11/25/2008  . Diabetes mellitus, insulin dependent (IDDM), controlled ([place]) 10/01/2008  . HYPOTENSION, ORTHOSTATIC 07/28/2008  . Hyperlipidemia LDL goal <70 06/09/2008  . ERECTILE DYSFUNCTION 06/09/2008  . Essential  hypertension 06/09/2008  . Coronary atherosclerosis of native coronary artery 06/09/2008  . Carotid stenosis 06/09/2008  . HYPOTENSION, UNSPECIFIED 06/09/2008    Past Surgical History:  Procedure Laterality Date  . [place]; ~ 2010  . CAROTID ENDARTERECTOMY Right   . CATARACT EXTRACTION, BILATERAL Bilateral   . CORONARY ANGIOPLASTY    . CORONARY ARTERY BYPASS GRAFT  1996   "CABG X4"  . MOHS SURGERY    . PERIPHERAL VASCULAR CATHETERIZATION N/A 12/15/2014   Procedure: Abdominal Aortogram;  Surgeon: Wellington Hampshire, MD;  Location: [place] [facility];  Service: Cardiovascular;  Laterality: N/A;  . REFRACTIVE SURGERY Right    "before cataract OR"       Home Medications    Prior to Admission medications   Medication Sig Start Date End Date Taking? Authorizing Provider  aspirin 162 MG EC tablet Take 162 mg by mouth daily.    [provider]  atorvastatin (LIPITOR) 80 MG tablet Take 1 tablet (80 mg total) by mouth daily. 05/23/11   Bensimhon, Shaune Pascal, MD  insulin aspart (NOVOLOG) 100 unit/mL injection Inject into the skin continuous. Insulin Pump    [provider]  lisinopril (PRINIVIL,ZESTRIL) 2.5 MG tablet Take 2.5 mg by mouth at bedtime.  07/18/10   Bensimhon, Shaune Pascal, MD  nitroGLYCERIN (NITROSTAT) 0.4 MG SL tablet Place 0.4 mg under the tongue every 5 (five) minutes as needed for chest pain.  [provider]  omeprazole (PRILOSEC) 20 MG capsule Take 20 mg by mouth daily.      [provider]  sertraline (ZOLOFT) 50 MG tablet Take 50 mg by mouth daily.    [provider]  zolpidem (AMBIEN) 10 MG tablet Take 10 mg by mouth at bedtime as needed for sleep.    [provider]    Family History Family History  Problem Relation Age of Onset  . Coronary artery disease Mother   . Diabetes Mother   . Stroke Unknown   . Coronary artery disease Father   . Diabetes Father     Social History Social History    Substance Use Topics  . Smoking status: Former Smoker    Packs/day: 2.50    Years: 35.00    Types: Cigarettes    Quit date: 02/05/1994  . Smokeless tobacco: Never Used  . Alcohol use Yes     Allergies   Patient has no known allergies.   Review of Systems Review of Systems   Physical Exam Triage Vital Signs ED Triage Vitals  Enc Vitals Group     BP 05/27/16 1505 128/64     Pulse Rate 05/27/16 1505 79     Resp 05/27/16 1505 16     Temp 05/27/16 1505 97.9 F (36.6 C)     Temp Source 05/27/16 1505 Oral     SpO2 05/27/16 1505 98 %     Weight 05/27/16 1503 250 lb (113.4 kg)     Height 05/27/16 1503 6' (1.829 m)     Head Circumference --      Peak Flow --      Pain Score 05/27/16 1504 0     Pain Loc --      Pain Edu? --      Excl. in [place]? --    No data found.   Updated Vital Signs BP 128/64 (BP Location: Left Arm)   Pulse 79   Temp 97.9 F (36.6 C) (Oral)   Resp 16   Ht 6' (1.829 m)   Wt 250 lb (113.4 kg)   SpO2 98%   BMI 33.91 kg/m   Visual Acuity Right Eye Distance:   Left Eye Distance:   Bilateral Distance:    Right Eye Near:   Left Eye Near:    Bilateral Near:     Physical Exam  Constitutional: He appears well-developed and well-nourished. No distress.  Musculoskeletal:       Right hand: He exhibits tenderness and laceration (skin tear over dorsum of thumb). He exhibits normal range of motion, normal two-point discrimination, normal capillary refill, no deformity and no swelling. Normal sensation noted. Normal strength noted.  Skin: He is not diaphoretic.  Nursing note and vitals reviewed.    UC Treatments / Results  Labs (all labs ordered are listed, but only abnormal results are displayed) Labs Reviewed - No data to display  EKG  EKG Interpretation None       Radiology No results found.  Procedures Procedures (including critical care time)  Medications Ordered in UC Medications - No data to display   Initial Impression /  Assessment and Plan / UC Course  I have reviewed the triage vital signs and the nursing notes.  Pertinent labs & imaging results that were available during my care of the patient were reviewed by me and considered in my medical decision making (see chart for details).       Final Clinical Impressions(s) / UC Diagnoses  Final diagnoses:  Contusion of right hand, initial encounter  Skin tear of right hand without complication, initial encounter    New Prescriptions Discharge Medication List as of 05/27/2016  3:34 PM     1. diagnosis reviewed with patient 2. Area cleaned, steri strips applied to hold skin tear down, and dressed 3. Recommend supportive treatment with routine wound care 4. Follow-up prn if symptoms worsen or don't improve   Norval Gable, MD 07/17/16 1134

## 2016-08-03 ENCOUNTER — Telehealth (HOSPITAL_COMMUNITY): Payer: Self-pay | Admitting: *Deleted

## 2016-08-03 NOTE — Telephone Encounter (Signed)
Pt came by office w/a bottle of Carvedilol 6.25 mg he got from the [place] and was told he needed to take it Twice daily but he would like Dr Bensimhon's opinion.  Dr Haroldine Laws states pt had been on this in the past and was stopped b/c pt couldn't tolerate.  He states if they want him to take it he should start with 3.125 mg BID and see if he tolerates that dose.  Pt is aware and agreeable.  Med list updated

## 2016-10-19 DIAGNOSIS — L570 Actinic keratosis: Secondary | ICD-10-CM | POA: Diagnosis not present

## 2016-10-19 DIAGNOSIS — L821 Other seborrheic keratosis: Secondary | ICD-10-CM | POA: Diagnosis not present

## 2016-10-19 DIAGNOSIS — X32XXXA Exposure to sunlight, initial encounter: Secondary | ICD-10-CM | POA: Diagnosis not present

## 2016-10-19 DIAGNOSIS — Z85828 Personal history of other malignant neoplasm of skin: Secondary | ICD-10-CM | POA: Diagnosis not present

## 2016-10-19 DIAGNOSIS — Z08 Encounter for follow-up examination after completed treatment for malignant neoplasm: Secondary | ICD-10-CM | POA: Diagnosis not present

## 2016-10-27 DIAGNOSIS — Z23 Encounter for immunization: Secondary | ICD-10-CM | POA: Diagnosis not present

## 2016-10-29 ENCOUNTER — Encounter: Payer: Self-pay | Admitting: Podiatry

## 2016-10-29 ENCOUNTER — Ambulatory Visit (INDEPENDENT_AMBULATORY_CARE_PROVIDER_SITE_OTHER): Payer: Medicare Other | Admitting: Podiatry

## 2016-10-29 DIAGNOSIS — L600 Ingrowing nail: Secondary | ICD-10-CM | POA: Diagnosis not present

## 2016-10-29 DIAGNOSIS — L03031 Cellulitis of right toe: Secondary | ICD-10-CM | POA: Diagnosis not present

## 2016-10-29 NOTE — Progress Notes (Signed)
This patient presents the office with chief complaint of pain noted along the inside border the big toe of the right foot.  He says that his been painful to the touch the last 4 weeks  . He is experiencing pain walking and wearing his shoes.  He says he had previously had surgery for the correction of this problem, but the nail has returned and become ingrown again. He denies any drainage or pus.  He believes the nail like his big toenail, left foot has fungus noted in the nail plate itself.  Patient presents the office today for an evaluation and treatment of this painful ingrown toenail, right foot   General Appearance  Alert, conversant and in no acute stress.  Vascular  Dorsalis pedis and posterior pulses are palpable  bilaterally.  Capillary return is within normal limits  Bilaterally. Temperature is within normal limits  Bilaterally  Neurologic  Senn-Weinstein monofilament wire test within normal limits  bilaterally. Muscle power  Within normal limits bilaterally.  Nails marked incurvation noted along the medial border of the right great toenail.  Redness and swelling noted at the distal aspect of the medial nail.   Orthopedic  No limitations of motion of motion feet bilaterally.  No crepitus or effusions noted.  No bony pathology or digital deformities noted.  Skin  normotropic skin with no porokeratosis noted bilaterally.  No signs of infections or ulcers noted.    Ingrown toenail  Paronychia right hallux.   ROV  Nail surgery.  Treatment options and alternatives discussed.  Recommended permanent phenol matrixectomy and patient agreed. Right hallux  was prepped with alcohol and a toe block of 3cc of 2% lidocaine plain was administered in a digital toe block. .  The toe was then prepped with betadine solution .  The offending nail border was then excised and matrix tissue exposed.  Phenol was then applied to the matrix tissue followed by an alcohol wash.  Antibiotic ointment and a dry sterile  dressing was applied.  The patient was dispensed instructions for aftercare. RTC 1 week.     Gardiner Barefoot DPM .

## 2016-11-08 ENCOUNTER — Encounter: Payer: Self-pay | Admitting: Podiatry

## 2016-11-08 ENCOUNTER — Ambulatory Visit (INDEPENDENT_AMBULATORY_CARE_PROVIDER_SITE_OTHER): Payer: Medicare Other | Admitting: Podiatry

## 2016-11-08 DIAGNOSIS — Z09 Encounter for follow-up examination after completed treatment for conditions other than malignant neoplasm: Secondary | ICD-10-CM

## 2016-11-08 NOTE — Progress Notes (Signed)
This patient returns to the office following nail surgery one week ago.  The patient says toe has been soaked and bandaged as directed.  There has been improvement of the toe since the surgery has been performed. The patient presents for continued evaluation and treatment.  GENERAL APPEARANCE: Alert, conversant. Appropriately groomed. No acute distress.  VASCULAR: Pedal pulses palpable at  DP and PT bilateral.  Capillary refill time is immediate to all digits,  Normal temperature gradient.    NEUROLOGIC: sensation is normal to 5.07 monofilament at 5/5 sites bilateral.  Light touch is intact bilateral, Muscle strength normal.  MUSCULOSKELETAL: acceptable muscle strength, tone and stability bilateral.  Intrinsic muscluature intact bilateral.  Rectus appearance of foot and digits noted bilateral.   DERMATOLOGIC: skin color, texture, and turgor are within normal limits.  No preulcerative lesions or ulcers  are seen, no interdigital maceration noted.   NAILS  There is necrotic tissue along the nail groove  In the absence of redness swelling and pain.  DX  S/p nail surgery  ROV  Home instructions were discussed.  Patient to call the office if there are any questions or concerns.   Catia Todorov DPM   

## 2016-11-20 DIAGNOSIS — L570 Actinic keratosis: Secondary | ICD-10-CM | POA: Diagnosis not present

## 2016-11-20 DIAGNOSIS — D485 Neoplasm of uncertain behavior of skin: Secondary | ICD-10-CM | POA: Diagnosis not present

## 2016-11-20 DIAGNOSIS — C44622 Squamous cell carcinoma of skin of right upper limb, including shoulder: Secondary | ICD-10-CM | POA: Diagnosis not present

## 2016-11-20 DIAGNOSIS — X32XXXA Exposure to sunlight, initial encounter: Secondary | ICD-10-CM | POA: Diagnosis not present

## 2016-11-29 DIAGNOSIS — C44622 Squamous cell carcinoma of skin of right upper limb, including shoulder: Secondary | ICD-10-CM | POA: Diagnosis not present

## 2016-12-21 ENCOUNTER — Ambulatory Visit (INDEPENDENT_AMBULATORY_CARE_PROVIDER_SITE_OTHER): Payer: Medicare Other | Admitting: Cardiovascular Disease

## 2016-12-21 ENCOUNTER — Encounter: Payer: Self-pay | Admitting: Cardiovascular Disease

## 2016-12-21 VITALS — BP 128/64 | HR 70 | Ht 73.0 in | Wt 252.2 lb

## 2016-12-21 DIAGNOSIS — I10 Essential (primary) hypertension: Secondary | ICD-10-CM | POA: Diagnosis not present

## 2016-12-21 DIAGNOSIS — I6523 Occlusion and stenosis of bilateral carotid arteries: Secondary | ICD-10-CM | POA: Diagnosis not present

## 2016-12-21 DIAGNOSIS — I739 Peripheral vascular disease, unspecified: Secondary | ICD-10-CM | POA: Diagnosis not present

## 2016-12-21 DIAGNOSIS — E785 Hyperlipidemia, unspecified: Secondary | ICD-10-CM

## 2016-12-21 NOTE — H&P (View-Only) (Signed)
Cardiology Office Note   Date:  12/21/2016   ID:  Derek Hogan, DOB November 29, 1945, MRN [id number]  PCP:  Kirk Ruths, MD  Cardiologist:  Dr. Haroldine Laws  Chief Complaint  Patient presents with  . other    OD appointment c/o leg pain. Meds reviewed verbally with pt.      History of Present Illness: Derek Hogan is a 71 y.o. male who presents for a follow-up visit regarding peripheral arterial disease.  He has known history of CAD S/P previous inferior wall myocardial infarction followed by CABG 1996. He had questionable TIA in March 2016. He quit smoking in 1996 after CABG. He has prolonged history of diabetes currently on insulin pump, Carotid disease status post right carotid endarterectomy, hyperlipidemia and hypertension. He is known to have peripheral arterial disease.  He had drug-coated balloon angioplasty followed by self-expanding stent placement to the proximal right popliteal artery for severe claudication. Most recent noninvasive vascular evaluation in May of this year showed patent right popliteal artery stent with moderate left SFA disease. He reports no calf claudication but does complain of discomfort in both thighs with walking and at rest.  He reports having an MRI done at the [place] which showed some spinal stenosis but he is worried that his symptoms might be related to peripheral arterial disease.  No chest pain or shortness of breath.   Past Medical History:  Diagnosis Date  . CAD, ARTERY BYPASS GRAFT 06/09/2008  . Dyslipidemia   . ERECTILE DYSFUNCTION 06/09/2008  . Erectile dysfunction   . GERD (gastroesophageal reflux disease)   . Heart attack ([place]) 1996  . HYPERLIPIDEMIA-MIXED 06/09/2008  . Hypertension   . HYPERTENSION, UNSPECIFIED 06/09/2008  . Hypotension   . Orthostatic hypotension    positive by orthostatic vital signs, encourage IV hydration  . PVD (peripheral vascular disease) ([place])    -- s/p right carotid endarterectomy by Dr. Drucie Opitz --  carotid u/s 0-39% (9/10) -- ABI L 0.95 R 1.0 (2/07), LE angiography by Dr. Fletcher Anon 11/9 stent to R pop artery  . Type I diabetes mellitus ([place]) dx'd 08/20/1966  . UNSPECIFIED PERIPHERAL VASCULAR DISEASE 10/06/2008  . Viral gastroenteritis    04/19/2010 improved  . Weakness     Past Surgical History:  Procedure Laterality Date  . [place]; ~ 2010  . CAROTID ENDARTERECTOMY Right   . CATARACT EXTRACTION, BILATERAL Bilateral   . CORONARY ANGIOPLASTY    . CORONARY ARTERY BYPASS GRAFT  1996   "CABG X4"  . MOHS SURGERY    . PERIPHERAL VASCULAR CATHETERIZATION N/A 12/15/2014   Procedure: Abdominal Aortogram;  Surgeon: Wellington Hampshire, MD;  Location: [facility];  Service: Cardiovascular;  Laterality: N/A;  . REFRACTIVE SURGERY Right    "before cataract OR"     Current Outpatient Medications  Medication Sig Dispense Refill  . aspirin 162 MG EC tablet Take 162 mg by mouth daily.    Marland Kitchen atorvastatin (LIPITOR) 80 MG tablet Take 1 tablet (80 mg total) by mouth daily.    . insulin aspart (NOVOLOG) 100 unit/mL injection Inject into the skin continuous. Insulin Pump    . lisinopril (PRINIVIL,ZESTRIL) 2.5 MG tablet Take 2.5 mg by mouth at bedtime.     . nitroGLYCERIN (NITROSTAT) 0.4 MG SL tablet Place under the tongue.    Marland Kitchen omeprazole (PRILOSEC) 20 MG capsule Take by mouth.    . sertraline (ZOLOFT) 50 MG tablet Take 50 mg by mouth daily.    Marland Kitchen  zolpidem (AMBIEN) 10 MG tablet Take 10 mg by mouth at bedtime as needed for sleep.     No current facility-administered medications for this visit.     Allergies:   Patient has no known allergies.    Social History:  The patient  reports that he quit smoking about 22 years ago. His smoking use included cigarettes. He has a 87.50 pack-year smoking history. he has never used smokeless tobacco. He reports that he drinks alcohol. He reports that he does not use drugs.   Family History:  The patient's family history includes Coronary  artery disease in his father and mother; Diabetes in his father and mother; Stroke in his unknown relative.    ROS:  Please see the history of present illness.   Otherwise, review of systems are positive for none.   All other systems are reviewed and negative.    PHYSICAL EXAM: VS:  BP 128/64 (BP Location: Left Arm, Patient Position: Sitting, Cuff Size: Normal)   Pulse 70   Ht 6\' 1"  (1.854 m)   Wt 252 lb 4 oz (114.4 kg)   BMI 33.28 kg/m  , BMI Body mass index is 33.28 kg/m. GEN: Well nourished, well developed, in no acute distress  HEENT: normal  Neck: no JVD, or masses. Right carotid bruit Cardiac: RRR; no murmurs, rubs, or gallops,no edema  Respiratory:  clear to auscultation bilaterally, normal work of breathing GI: soft, nontender, nondistended, + BS MS: no deformity or atrophy  Skin: warm and dry, no rash Neuro:  Strength and sensation are intact Psych: euthymic mood, full affect Vascular: Femoral pulses but palpable bilaterally.  EKG:  EKG is ordered today. EKG showed normal sinus rhythm with possible old septal and inferior infarct.  Recent Labs: No results found for requested labs within last 8760 hours.    Lipid Panel    Component Value Date/Time   CHOL 116 07/08/2014 0830   TRIG 51 07/08/2014 0830   HDL 43 07/08/2014 0830   CHOLHDL 2.7 07/08/2014 0830   CHOLHDL 2.7 07/19/2010 0839   VLDL 10 07/19/2010 0839   LDLCALC 63 07/08/2014 0830      Wt Readings from Last 3 Encounters:  12/21/16 252 lb 4 oz (114.4 kg)  05/27/16 250 lb (113.4 kg)  11/07/15 244 lb 4 oz (110.8 kg)        ASSESSMENT AND PLAN:  1.  Peripheral arterial disease: Status post angioplasty and stent placement to the right popliteal artery.  No calf claudication.  However, he reports bilateral thigh discomfort at rest and with walking.  I suspect that this is likely due to his spinal disease however, it is reasonable to exclude aortoiliac disease and thus I requested an aortoiliac  duplex.  2. Coronary artery disease involving native coronary arteries without angina: He continues to be asymptomatic. Continue medical therapy.  3. Hyperlipidemia: Continue treatment with high dose atorvastatin with a target LDL of less than 70.   4. Carotid disease status post right carotid endarterectomy: Most recent duplex in November 2016 showed mild disease on the right.  I requested a follow-up carotid Doppler.   Disposition:   FU with me in 12 months  Signed,  Kathlyn Sacramento, MD  12/21/2016 8:29 AM    [place]

## 2016-12-21 NOTE — Progress Notes (Signed)
Cardiology Office Note   Date:  12/21/2016   ID:  Derek Hogan, DOB [birth year], MRN [id number]  PCP:  Kirk Ruths, MD  Cardiologist:  Dr. Haroldine Laws  Chief Complaint  Patient presents with  . other    OD appointment c/o leg pain. Meds reviewed verbally with pt.      History of Present Illness: Derek Hogan is a 71 y.o. male who presents for a follow-up visit regarding peripheral arterial disease.  He has known history of CAD S/P previous inferior wall myocardial infarction followed by CABG 1996. He had questionable TIA in March 2016. He quit smoking in 1996 after CABG. He has prolonged history of diabetes currently on insulin pump, Carotid disease status post right carotid endarterectomy, hyperlipidemia and hypertension. He is known to have peripheral arterial disease.  He had drug-coated balloon angioplasty followed by self-expanding stent placement to the proximal right popliteal artery for severe claudication. Most recent noninvasive vascular evaluation in May of this year showed patent right popliteal artery stent with moderate left SFA disease. He reports no calf claudication but does complain of discomfort in both thighs with walking and at rest.  He reports having an MRI done at the [place] which showed some spinal stenosis but he is worried that his symptoms might be related to peripheral arterial disease.  No chest pain or shortness of breath.   Past Medical History:  Diagnosis Date  . CAD, ARTERY BYPASS GRAFT 06/09/2008  . Dyslipidemia   . ERECTILE DYSFUNCTION 06/09/2008  . Erectile dysfunction   . GERD (gastroesophageal reflux disease)   . Heart attack ([place]) 1996  . HYPERLIPIDEMIA-MIXED 06/09/2008  . Hypertension   . HYPERTENSION, UNSPECIFIED 06/09/2008  . Hypotension   . Orthostatic hypotension    positive by orthostatic vital signs, encourage IV hydration  . PVD (peripheral vascular disease) ([place])    -- s/p right carotid endarterectomy by Dr. Drucie Opitz --  carotid u/s 0-39% (9/10) -- ABI L 0.95 R 1.0 (2/07), LE angiography by Dr. Fletcher Anon 11/9 stent to R pop artery  . Type I diabetes mellitus ([place]) dx'd 08/20/1966  . UNSPECIFIED PERIPHERAL VASCULAR DISEASE 10/06/2008  . Viral gastroenteritis    04/19/2010 improved  . Weakness     Past Surgical History:  Procedure Laterality Date  . [place]; ~ 2010  . CAROTID ENDARTERECTOMY Right   . CATARACT EXTRACTION, BILATERAL Bilateral   . CORONARY ANGIOPLASTY    . CORONARY ARTERY BYPASS GRAFT  1996   "CABG X4"  . MOHS SURGERY    . PERIPHERAL VASCULAR CATHETERIZATION N/A 12/15/2014   Procedure: Abdominal Aortogram;  Surgeon: Wellington Hampshire, MD;  Location: [facility];  Service: Cardiovascular;  Laterality: N/A;  . REFRACTIVE SURGERY Right    "before cataract OR"     Current Outpatient Medications  Medication Sig Dispense Refill  . aspirin 162 MG EC tablet Take 162 mg by mouth daily.    Marland Kitchen atorvastatin (LIPITOR) 80 MG tablet Take 1 tablet (80 mg total) by mouth daily.    . insulin aspart (NOVOLOG) 100 unit/mL injection Inject into the skin continuous. Insulin Pump    . lisinopril (PRINIVIL,ZESTRIL) 2.5 MG tablet Take 2.5 mg by mouth at bedtime.     . nitroGLYCERIN (NITROSTAT) 0.4 MG SL tablet Place under the tongue.    Marland Kitchen omeprazole (PRILOSEC) 20 MG capsule Take by mouth.    . sertraline (ZOLOFT) 50 MG tablet Take 50 mg by mouth daily.    Marland Kitchen  zolpidem (AMBIEN) 10 MG tablet Take 10 mg by mouth at bedtime as needed for sleep.     No current facility-administered medications for this visit.     Allergies:   Patient has no known allergies.    Social History:  The patient  reports that he quit smoking about 22 years ago. His smoking use included cigarettes. He has a 87.50 pack-year smoking history. he has never used smokeless tobacco. He reports that he drinks alcohol. He reports that he does not use drugs.   Family History:  The patient's family history includes Coronary  artery disease in his father and mother; Diabetes in his father and mother; Stroke in his unknown relative.    ROS:  Please see the history of present illness.   Otherwise, review of systems are positive for none.   All other systems are reviewed and negative.    PHYSICAL EXAM: VS:  BP 128/64 (BP Location: Left Arm, Patient Position: Sitting, Cuff Size: Normal)   Pulse 70   Ht 6\' 1"  (1.854 m)   Wt 252 lb 4 oz (114.4 kg)   BMI 33.28 kg/m  , BMI Body mass index is 33.28 kg/m. GEN: Well nourished, well developed, in no acute distress  HEENT: normal  Neck: no JVD, or masses. Right carotid bruit Cardiac: RRR; no murmurs, rubs, or gallops,no edema  Respiratory:  clear to auscultation bilaterally, normal work of breathing GI: soft, nontender, nondistended, + BS MS: no deformity or atrophy  Skin: warm and dry, no rash Neuro:  Strength and sensation are intact Psych: euthymic mood, full affect Vascular: Femoral pulses but palpable bilaterally.  EKG:  EKG is ordered today. EKG showed normal sinus rhythm with possible old septal and inferior infarct.  Recent Labs: No results found for requested labs within last 8760 hours.    Lipid Panel    Component Value Date/Time   CHOL 116 07/08/2014 0830   TRIG 51 07/08/2014 0830   HDL 43 07/08/2014 0830   CHOLHDL 2.7 07/08/2014 0830   CHOLHDL 2.7 07/19/2010 0839   VLDL 10 07/19/2010 0839   LDLCALC 63 07/08/2014 0830      Wt Readings from Last 3 Encounters:  12/21/16 252 lb 4 oz (114.4 kg)  05/27/16 250 lb (113.4 kg)  11/07/15 244 lb 4 oz (110.8 kg)        ASSESSMENT AND PLAN:  1.  Peripheral arterial disease: Status post angioplasty and stent placement to the right popliteal artery.  No calf claudication.  However, he reports bilateral thigh discomfort at rest and with walking.  I suspect that this is likely due to his spinal disease however, it is reasonable to exclude aortoiliac disease and thus I requested an aortoiliac  duplex.  2. Coronary artery disease involving native coronary arteries without angina: He continues to be asymptomatic. Continue medical therapy.  3. Hyperlipidemia: Continue treatment with high dose atorvastatin with a target LDL of less than 70.   4. Carotid disease status post right carotid endarterectomy: Most recent duplex in November 2016 showed mild disease on the right.  I requested a follow-up carotid Doppler.   Disposition:   FU with me in 12 months  Signed,  Kathlyn Sacramento, MD  12/21/2016 8:29 AM    [place]

## 2016-12-21 NOTE — Patient Instructions (Signed)
Medication Instructions:  Your physician recommends that you continue on your current medications as directed. Please refer to the Current Medication list given to you today.   Labwork: none  Testing/Procedures: Your physician has requested that you have a carotid duplex. This test is an ultrasound of the carotid arteries in your neck. It looks at blood flow through these arteries that supply the brain with blood. Allow one hour for this exam. There are no restrictions or special instructions.  Your physician has requested that you have an aorta iliac duplex. During this test, an ultrasound is used to evaluate the aorta. Allow 30 minutes for this exam. Do not eat after midnight the day before and avoid carbonated beverages   Follow-Up: Your physician wants you to follow-up in: 1 year with Dr. Fletcher Anon. You will receive a reminder letter in the mail two months in advance. If you don't receive a letter, please call our office to schedule the follow-up appointment.   Any Other Special Instructions Will Be Listed Below (If Applicable).     If you need a refill on your cardiac medications before your next appointment, please call your pharmacy.

## 2017-01-02 ENCOUNTER — Ambulatory Visit: Payer: Medicare Other | Admitting: Nurse Practitioner

## 2017-01-03 ENCOUNTER — Ambulatory Visit (INDEPENDENT_AMBULATORY_CARE_PROVIDER_SITE_OTHER): Payer: Medicare Other

## 2017-01-03 DIAGNOSIS — I739 Peripheral vascular disease, unspecified: Secondary | ICD-10-CM | POA: Diagnosis not present

## 2017-01-03 DIAGNOSIS — I6523 Occlusion and stenosis of bilateral carotid arteries: Secondary | ICD-10-CM

## 2017-01-04 LAB — VAS US CAROTID
LCCADDIAS: -16 cm/s
LCCADSYS: -100 cm/s
LCCAPDIAS: 16 cm/s
LCCAPSYS: 86 cm/s
LEFT ECA DIAS: -17 cm/s
LEFT VERTEBRAL DIAS: 11 cm/s
LICADDIAS: -19 cm/s
LICADSYS: -63 cm/s
LICAPSYS: -74 cm/s
Left ICA prox dias: -21 cm/s
RCCAPSYS: 83 cm/s
RIGHT ECA DIAS: -12 cm/s
RIGHT VERTEBRAL DIAS: 10 cm/s
Right CCA prox dias: 17 cm/s
Right cca dist sys: -69 cm/s

## 2017-01-08 ENCOUNTER — Other Ambulatory Visit: Payer: Self-pay

## 2017-01-08 DIAGNOSIS — I6523 Occlusion and stenosis of bilateral carotid arteries: Secondary | ICD-10-CM

## 2017-01-09 ENCOUNTER — Ambulatory Visit: Payer: Medicare Other | Admitting: Nurse Practitioner

## 2017-01-09 ENCOUNTER — Telehealth: Payer: Self-pay | Admitting: Cardiovascular Disease

## 2017-01-09 NOTE — Telephone Encounter (Signed)
Called patient to let him know we are working on getting additional imaging cd and it should arrive by the end of the week (expected)   Patient would now like to know most recent Vascular test result   Please call

## 2017-01-09 NOTE — Telephone Encounter (Signed)
Results of aorta/iliac doppler given to patient and he verbalized understanding.

## 2017-01-10 ENCOUNTER — Ambulatory Visit (HOSPITAL_COMMUNITY)
Admission: RE | Admit: 2017-01-10 | Discharge: 2017-01-10 | Disposition: A | Discharge status: Home / Self Care | Payer: Non-veteran care | Source: Ambulatory Visit | Attending: Internal Medicine | Admitting: Internal Medicine

## 2017-01-10 ENCOUNTER — Encounter (HOSPITAL_COMMUNITY): Payer: Self-pay | Admitting: *Deleted

## 2017-01-10 ENCOUNTER — Other Ambulatory Visit (HOSPITAL_COMMUNITY): Payer: Self-pay | Admitting: *Deleted

## 2017-01-10 ENCOUNTER — Telehealth (HOSPITAL_COMMUNITY): Payer: Self-pay | Admitting: *Deleted

## 2017-01-10 DIAGNOSIS — R9439 Abnormal result of other cardiovascular function study: Secondary | ICD-10-CM

## 2017-01-10 DIAGNOSIS — I739 Peripheral vascular disease, unspecified: Secondary | ICD-10-CM

## 2017-01-10 LAB — CBC
HCT: 42.1 % (ref 39.0–52.0)
HEMOGLOBIN: 14.1 g/dL (ref 13.0–17.0)
MCH: 31.6 pg (ref 26.0–34.0)
MCHC: 33.5 g/dL (ref 30.0–36.0)
MCV: 94.4 fL (ref 78.0–100.0)
PLATELETS: 221 10*3/uL (ref 150–400)
RBC: 4.46 MIL/uL (ref 4.22–5.81)
RDW: 13.2 % (ref 11.5–15.5)
WBC: 9.1 10*3/uL (ref 4.0–10.5)

## 2017-01-10 LAB — BASIC METABOLIC PANEL
ANION GAP: 6 (ref 5–15)
BUN: 10 mg/dL (ref 6–20)
CALCIUM: 8.9 mg/dL (ref 8.9–10.3)
CO2: 26 mmol/L (ref 22–32)
CREATININE: 0.95 mg/dL (ref 0.61–1.24)
Chloride: 105 mmol/L (ref 101–111)
GLUCOSE: 209 mg/dL — AB (ref 65–99)
Potassium: 4.8 mmol/L (ref 3.5–5.1)
Sodium: 137 mmol/L (ref 135–145)

## 2017-01-10 LAB — PROTIME-INR
INR: 1.07
PROTHROMBIN TIME: 13.8 s (ref 11.4–15.2)

## 2017-01-10 NOTE — Telephone Encounter (Signed)
Per Dr Haroldine Laws pt had abnormal stress test at the [facility] and needs [facility] to further evaluate.  Pt aware and cath scheduled for 12/7 at 7:30, labs done today as pt was in office to schedule cath.

## 2017-01-11 ENCOUNTER — Ambulatory Visit (HOSPITAL_COMMUNITY)
Admission: RE | Admit: 2017-01-11 | Discharge: 2017-01-11 | Disposition: A | Discharge status: Home / Self Care | Payer: Medicare Other | Source: Ambulatory Visit | Attending: Internal Medicine | Admitting: Internal Medicine

## 2017-01-11 ENCOUNTER — Encounter (HOSPITAL_COMMUNITY)
Admission: RE | Disposition: A | Discharge status: Home / Self Care | Payer: Self-pay | Source: Ambulatory Visit | Attending: Internal Medicine

## 2017-01-11 DIAGNOSIS — I252 Old myocardial infarction: Secondary | ICD-10-CM | POA: Insufficient documentation

## 2017-01-11 DIAGNOSIS — I25810 Atherosclerosis of coronary artery bypass graft(s) without angina pectoris: Secondary | ICD-10-CM | POA: Diagnosis not present

## 2017-01-11 DIAGNOSIS — Z79899 Other long term (current) drug therapy: Secondary | ICD-10-CM | POA: Insufficient documentation

## 2017-01-11 DIAGNOSIS — E1151 Type 2 diabetes mellitus with diabetic peripheral angiopathy without gangrene: Secondary | ICD-10-CM | POA: Diagnosis not present

## 2017-01-11 DIAGNOSIS — R9439 Abnormal result of other cardiovascular function study: Secondary | ICD-10-CM

## 2017-01-11 DIAGNOSIS — Z7982 Long term (current) use of aspirin: Secondary | ICD-10-CM | POA: Diagnosis not present

## 2017-01-11 DIAGNOSIS — Z87891 Personal history of nicotine dependence: Secondary | ICD-10-CM | POA: Insufficient documentation

## 2017-01-11 DIAGNOSIS — I10 Essential (primary) hypertension: Secondary | ICD-10-CM | POA: Insufficient documentation

## 2017-01-11 DIAGNOSIS — E782 Mixed hyperlipidemia: Secondary | ICD-10-CM | POA: Insufficient documentation

## 2017-01-11 DIAGNOSIS — I2510 Atherosclerotic heart disease of native coronary artery without angina pectoris: Secondary | ICD-10-CM | POA: Insufficient documentation

## 2017-01-11 HISTORY — PX: LEFT HEART CATH AND CORS/GRAFTS ANGIOGRAPHY: CATH118250

## 2017-01-11 LAB — POCT ACTIVATED CLOTTING TIME
ACTIVATED CLOTTING TIME: 180 s
ACTIVATED CLOTTING TIME: 197 s

## 2017-01-11 LAB — GLUCOSE, CAPILLARY
GLUCOSE-CAPILLARY: 243 mg/dL — AB (ref 65–99)
GLUCOSE-CAPILLARY: 173 mg/dL — AB (ref 65–99)

## 2017-01-11 SURGERY — LEFT HEART CATH AND CORS/GRAFTS ANGIOGRAPHY
Anesthesia: LOCAL

## 2017-01-11 MED ORDER — FENTANYL CITRATE (PF) 100 MCG/2ML IJ SOLN
INTRAMUSCULAR | Status: DC | PRN
  Administered 2017-01-11 (×2): 25 ug via INTRAVENOUS

## 2017-01-11 MED ORDER — LIDOCAINE HCL (PF) 1 % IJ SOLN
INTRAMUSCULAR | Status: AC
  Filled 2017-01-11: qty 30

## 2017-01-11 MED ORDER — HEPARIN (PORCINE) IN NACL 2-0.9 UNIT/ML-% IJ SOLN
INTRAMUSCULAR | Status: AC | PRN
  Administered 2017-01-11: 1000 mL

## 2017-01-11 MED ORDER — VERAPAMIL HCL 2.5 MG/ML IV SOLN
INTRAVENOUS | Status: DC | PRN
  Administered 2017-01-11: 10 mL via INTRA_ARTERIAL

## 2017-01-11 MED ORDER — SODIUM CHLORIDE 0.9% FLUSH: 3.0000 mL | INTRAVENOUS | Status: DC | PRN

## 2017-01-11 MED ORDER — MIDAZOLAM HCL 2 MG/2ML IJ SOLN
INTRAMUSCULAR | Status: AC
  Filled 2017-01-11: qty 2

## 2017-01-11 MED ORDER — HEPARIN SODIUM (PORCINE) 1000 UNIT/ML IJ SOLN
INTRAMUSCULAR | Status: AC
  Filled 2017-01-11: qty 1

## 2017-01-11 MED ORDER — VERAPAMIL HCL 2.5 MG/ML IV SOLN
INTRAVENOUS | Status: AC
  Filled 2017-01-11: qty 2

## 2017-01-11 MED ORDER — MIDAZOLAM HCL 2 MG/2ML IJ SOLN
INTRAMUSCULAR | Status: DC | PRN
  Administered 2017-01-11: 2 mg via INTRAVENOUS

## 2017-01-11 MED ORDER — SODIUM CHLORIDE 0.9% FLUSH: 3.0000 mL | Freq: Two times a day (BID) | INTRAVENOUS | Status: DC

## 2017-01-11 MED ORDER — SODIUM CHLORIDE 0.9 % IV SOLN
INTRAVENOUS | Status: DC
  Administered 2017-01-11: 07:00:00 via INTRAVENOUS

## 2017-01-11 MED ORDER — SODIUM CHLORIDE 0.9 % IV SOLN: 250.0000 mL | INTRAVENOUS | Status: DC | PRN

## 2017-01-11 MED ORDER — IOPAMIDOL (ISOVUE-370) INJECTION 76%
INTRAVENOUS | Status: AC
  Filled 2017-01-11: qty 125

## 2017-01-11 MED ORDER — HEPARIN (PORCINE) IN NACL 2-0.9 UNIT/ML-% IJ SOLN
INTRAMUSCULAR | Status: AC
  Filled 2017-01-11: qty 1000

## 2017-01-11 MED ORDER — SODIUM CHLORIDE 0.9 % IV SOLN: INTRAVENOUS | Status: AC

## 2017-01-11 MED ORDER — ASPIRIN 81 MG PO CHEW
CHEWABLE_TABLET | ORAL | Status: AC
  Administered 2017-01-11: 81 mg
  Filled 2017-01-11: qty 1

## 2017-01-11 MED ORDER — IOPAMIDOL (ISOVUE-370) INJECTION 76%
INTRAVENOUS | Status: DC | PRN
  Administered 2017-01-11: 90 mL via INTRA_ARTERIAL

## 2017-01-11 MED ORDER — ACETAMINOPHEN 325 MG PO TABS: 650.0000 mg | ORAL_TABLET | ORAL | Status: DC | PRN

## 2017-01-11 MED ORDER — FENTANYL CITRATE (PF) 100 MCG/2ML IJ SOLN
INTRAMUSCULAR | Status: AC
  Filled 2017-01-11: qty 2

## 2017-01-11 MED ORDER — HEPARIN SODIUM (PORCINE) 1000 UNIT/ML IJ SOLN
INTRAMUSCULAR | Status: DC | PRN
  Administered 2017-01-11: 5000 [IU] via INTRAVENOUS

## 2017-01-11 MED ORDER — LIDOCAINE HCL (PF) 1 % IJ SOLN
INTRAMUSCULAR | Status: DC | PRN
  Administered 2017-01-11: 10 mL via SUBCUTANEOUS
  Administered 2017-01-11: 5 mL via SUBCUTANEOUS

## 2017-01-11 MED ORDER — ONDANSETRON HCL 4 MG/2ML IJ SOLN: 4.0000 mg | Freq: Four times a day (QID) | INTRAMUSCULAR | Status: DC | PRN

## 2017-01-11 SURGICAL SUPPLY — 17 items
CATH INFINITI 5 FR RCB (CATHETERS) ×2 IMPLANT
CATH INFINITI 5FR JL4 (CATHETERS) ×2 IMPLANT
CATH INFINITI JR4 5F (CATHETERS) ×2 IMPLANT
DEVICE RAD COMP TR BAND LRG (VASCULAR PRODUCTS) ×2 IMPLANT
GLIDESHEATH SLEND SS 6F .021 (SHEATH) ×2 IMPLANT
GUIDEWIRE INQWIRE 1.5J.035X260 (WIRE) ×1 IMPLANT
INQWIRE 1.5J .035X260CM (WIRE) ×2
KIT HEART LEFT (KITS) ×2 IMPLANT
PACK CARDIAC CATHETERIZATION (CUSTOM PROCEDURE TRAY) ×2 IMPLANT
SHEATH GLIDE SLENDER 4/5FR (SHEATH) IMPLANT
SHEATH PINNACLE 5F 10CM (SHEATH) ×2 IMPLANT
SYR MEDRAD MARK V 150ML (SYRINGE) IMPLANT
TRANSDUCER W/STOPCOCK (MISCELLANEOUS) ×2 IMPLANT
TUBING CIL FLEX 10 FLL-RA (TUBING) ×2 IMPLANT
WIRE EMERALD 3MM-J .035X150CM (WIRE) ×2 IMPLANT
WIRE EMERALD ST .035X150CM (WIRE) IMPLANT
WIRE HI TORQ VERSACORE-J 145CM (WIRE) ×4 IMPLANT

## 2017-01-11 NOTE — Progress Notes (Signed)
Site area: rt groin fa sheath Site Prior to Removal:  Level 0 Pressure Applied For:  20 minutes Manual:   yes Patient Status During Pull:  stable Post Pull Site:  Level  0 Post Pull Instructions Given:  yes Post Pull Pulses Present: palpable Dressing Applied:  Gauze and tegaderm Bedrest begins @ 0935 Comments:

## 2017-01-11 NOTE — Discharge Instructions (Signed)
Femoral Site Care °Refer to this sheet in the next few weeks. These instructions provide you with information about caring for yourself after your procedure. Your health care provider may also give you more specific instructions. Your treatment has been planned according to current medical practices, but problems sometimes occur. Call your health care provider if you have any problems or questions after your procedure. °What can I expect after the procedure? °After your procedure, it is typical to have the following: °· Bruising at the site that usually fades within 1-2 weeks. °· Blood collecting in the tissue (hematoma) that may be painful to the touch. It should usually decrease in size and tenderness within 1-2 weeks. ° °Follow these instructions at home: °· Take medicines only as directed by your health care provider. °· You may shower 24-48 hours after the procedure or as directed by your health care provider. Remove the bandage (dressing) and gently wash the site with plain soap and water. Pat the area dry with a clean towel. Do not rub the site, because this may cause bleeding. °· Do not take baths, swim, or use a hot tub until your health care provider approves. °· Check your insertion site every day for redness, swelling, or drainage. °· Do not apply powder or lotion to the site. °· Limit use of stairs to twice a day for the first 2-3 days or as directed by your health care provider. °· Do not squat for the first 2-3 days or as directed by your health care provider. °· Do not lift over 10 lb (4.5 kg) for 5 days after your procedure or as directed by your health care provider. °· Ask your health care provider when it is okay to: °? Return to work or school. °? Resume usual physical activities or sports. °? Resume sexual activity. °· Do not drive home if you are discharged the same day as the procedure. Have someone else drive you. °· You may drive 24 hours after the procedure unless otherwise instructed by  your health care provider. °· Do not operate machinery or power tools for 24 hours after the procedure or as directed by your health care provider. °· If your procedure was done as an outpatient procedure, which means that you went home the same day as your procedure, a responsible adult should be with you for the first 24 hours after you arrive home. °· Keep all follow-up visits as directed by your health care provider. This is important. °Contact a health care provider if: °· You have a fever. °· You have chills. °· You have increased bleeding from the site. Hold pressure on the site. °Get help right away if: °· You have unusual pain at the site. °· You have redness, warmth, or swelling at the site. °· You have drainage (other than a small amount of blood on the dressing) from the site. °· The site is bleeding, and the bleeding does not stop after 30 minutes of holding steady pressure on the site. °· Your leg or foot becomes pale, cool, tingly, or numb. °This information is not intended to replace advice given to you by your health care provider. Make sure you discuss any questions you have with your health care provider. °Document Released: 09/25/2013 Document Revised: 06/30/2015 Document Reviewed: 08/11/2013 °Elsevier Interactive Patient Education © 2018 [employer] ° °Radial Site Care °Refer to this sheet in the next few weeks. These instructions provide you with information about caring for yourself after your procedure. Your health   care provider may also give you more specific instructions. Your treatment has been planned according to current medical practices, but problems sometimes occur. Call your health care provider if you have any problems or questions after your procedure. °What can I expect after the procedure? °After your procedure, it is typical to have the following: °· Bruising at the radial site that usually fades within 1-2 weeks. °· Blood collecting in the tissue (hematoma) that may be  painful to the touch. It should usually decrease in size and tenderness within 1-2 weeks. ° °Follow these instructions at home: °· Take medicines only as directed by your health care provider. °· You may shower 24-48 hours after the procedure or as directed by your health care provider. Remove the bandage (dressing) and gently wash the site with plain soap and water. Pat the area dry with a clean towel. Do not rub the site, because this may cause bleeding. °· Do not take baths, swim, or use a hot tub until your health care provider approves. °· Check your insertion site every day for redness, swelling, or drainage. °· Do not apply powder or lotion to the site. °· Do not flex or bend the affected arm for 24 hours or as directed by your health care provider. °· Do not push or pull heavy objects with the affected arm for 24 hours or as directed by your health care provider. °· Do not lift over 10 lb (4.5 kg) for 5 days after your procedure or as directed by your health care provider. °· Ask your health care provider when it is okay to: °? Return to work or school. °? Resume usual physical activities or sports. °? Resume sexual activity. °· Do not drive home if you are discharged the same day as the procedure. Have someone else drive you. °· You may drive 24 hours after the procedure unless otherwise instructed by your health care provider. °· Do not operate machinery or power tools for 24 hours after the procedure. °· If your procedure was done as an outpatient procedure, which means that you went home the same day as your procedure, a responsible adult should be with you for the first 24 hours after you arrive home. °· Keep all follow-up visits as directed by your health care provider. This is important. °Contact a health care provider if: °· You have a fever. °· You have chills. °· You have increased bleeding from the radial site. Hold pressure on the site. °Get help right away if: °· You have unusual pain at the  radial site. °· You have redness, warmth, or swelling at the radial site. °· You have drainage (other than a small amount of blood on the dressing) from the radial site. °· The radial site is bleeding, and the bleeding does not stop after 30 minutes of holding steady pressure on the site. °· Your arm or hand becomes pale, cool, tingly, or numb. °This information is not intended to replace advice given to you by your health care provider. Make sure you discuss any questions you have with your health care provider. °Document Released: 02/24/2010 Document Revised: 06/30/2015 Document Reviewed: 08/10/2013 °Elsevier Interactive Patient Education © 2018 [employer] ° ° °

## 2017-01-11 NOTE — Interval H&P Note (Signed)
History and Physical Interval Note:  01/11/2017 7:47 AM   Patient with DOE. Underwent nuclear stress at [facility] showing anterior ischemia. Plan repeat cath.  Derek Hogan  has presented today for surgery, with the diagnosis of Abnormal Stress Test  The various methods of treatment have been discussed with the patient and family. After consideration of risks, benefits and other options for treatment, the patient has consented to  Procedure(s): LEFT HEART CATH AND CORS/GRAFTS ANGIOGRAPHY (N/A) and possible coronary angioplasty as a surgical intervention .  The patient's history has been reviewed, patient examined, no change in status, stable for surgery.  I have reviewed the patient's chart and labs.  Questions were answered to the patient's satisfaction.     Derek Hogan

## 2017-01-13 ENCOUNTER — Encounter (HOSPITAL_COMMUNITY): Payer: Self-pay | Admitting: Internal Medicine

## 2017-02-19 DIAGNOSIS — Z85828 Personal history of other malignant neoplasm of skin: Secondary | ICD-10-CM | POA: Diagnosis not present

## 2017-02-19 DIAGNOSIS — D2261 Melanocytic nevi of right upper limb, including shoulder: Secondary | ICD-10-CM | POA: Diagnosis not present

## 2017-02-19 DIAGNOSIS — D225 Melanocytic nevi of trunk: Secondary | ICD-10-CM | POA: Diagnosis not present

## 2017-02-19 DIAGNOSIS — L821 Other seborrheic keratosis: Secondary | ICD-10-CM | POA: Diagnosis not present

## 2017-07-26 DIAGNOSIS — L538 Other specified erythematous conditions: Secondary | ICD-10-CM | POA: Diagnosis not present

## 2017-07-26 DIAGNOSIS — L570 Actinic keratosis: Secondary | ICD-10-CM | POA: Diagnosis not present

## 2017-07-26 DIAGNOSIS — L820 Inflamed seborrheic keratosis: Secondary | ICD-10-CM | POA: Diagnosis not present

## 2017-07-26 DIAGNOSIS — X32XXXA Exposure to sunlight, initial encounter: Secondary | ICD-10-CM | POA: Diagnosis not present

## 2017-08-20 IMAGING — CR DG CHEST 2V
2 series · 2 of 2 positions shown · non-contrast
Comparison: 06/16/2012

CLINICAL DATA: Chest pain.  Confusion, shortness of breath.

EXAM:
CHEST  2 VIEW

[chest pa]
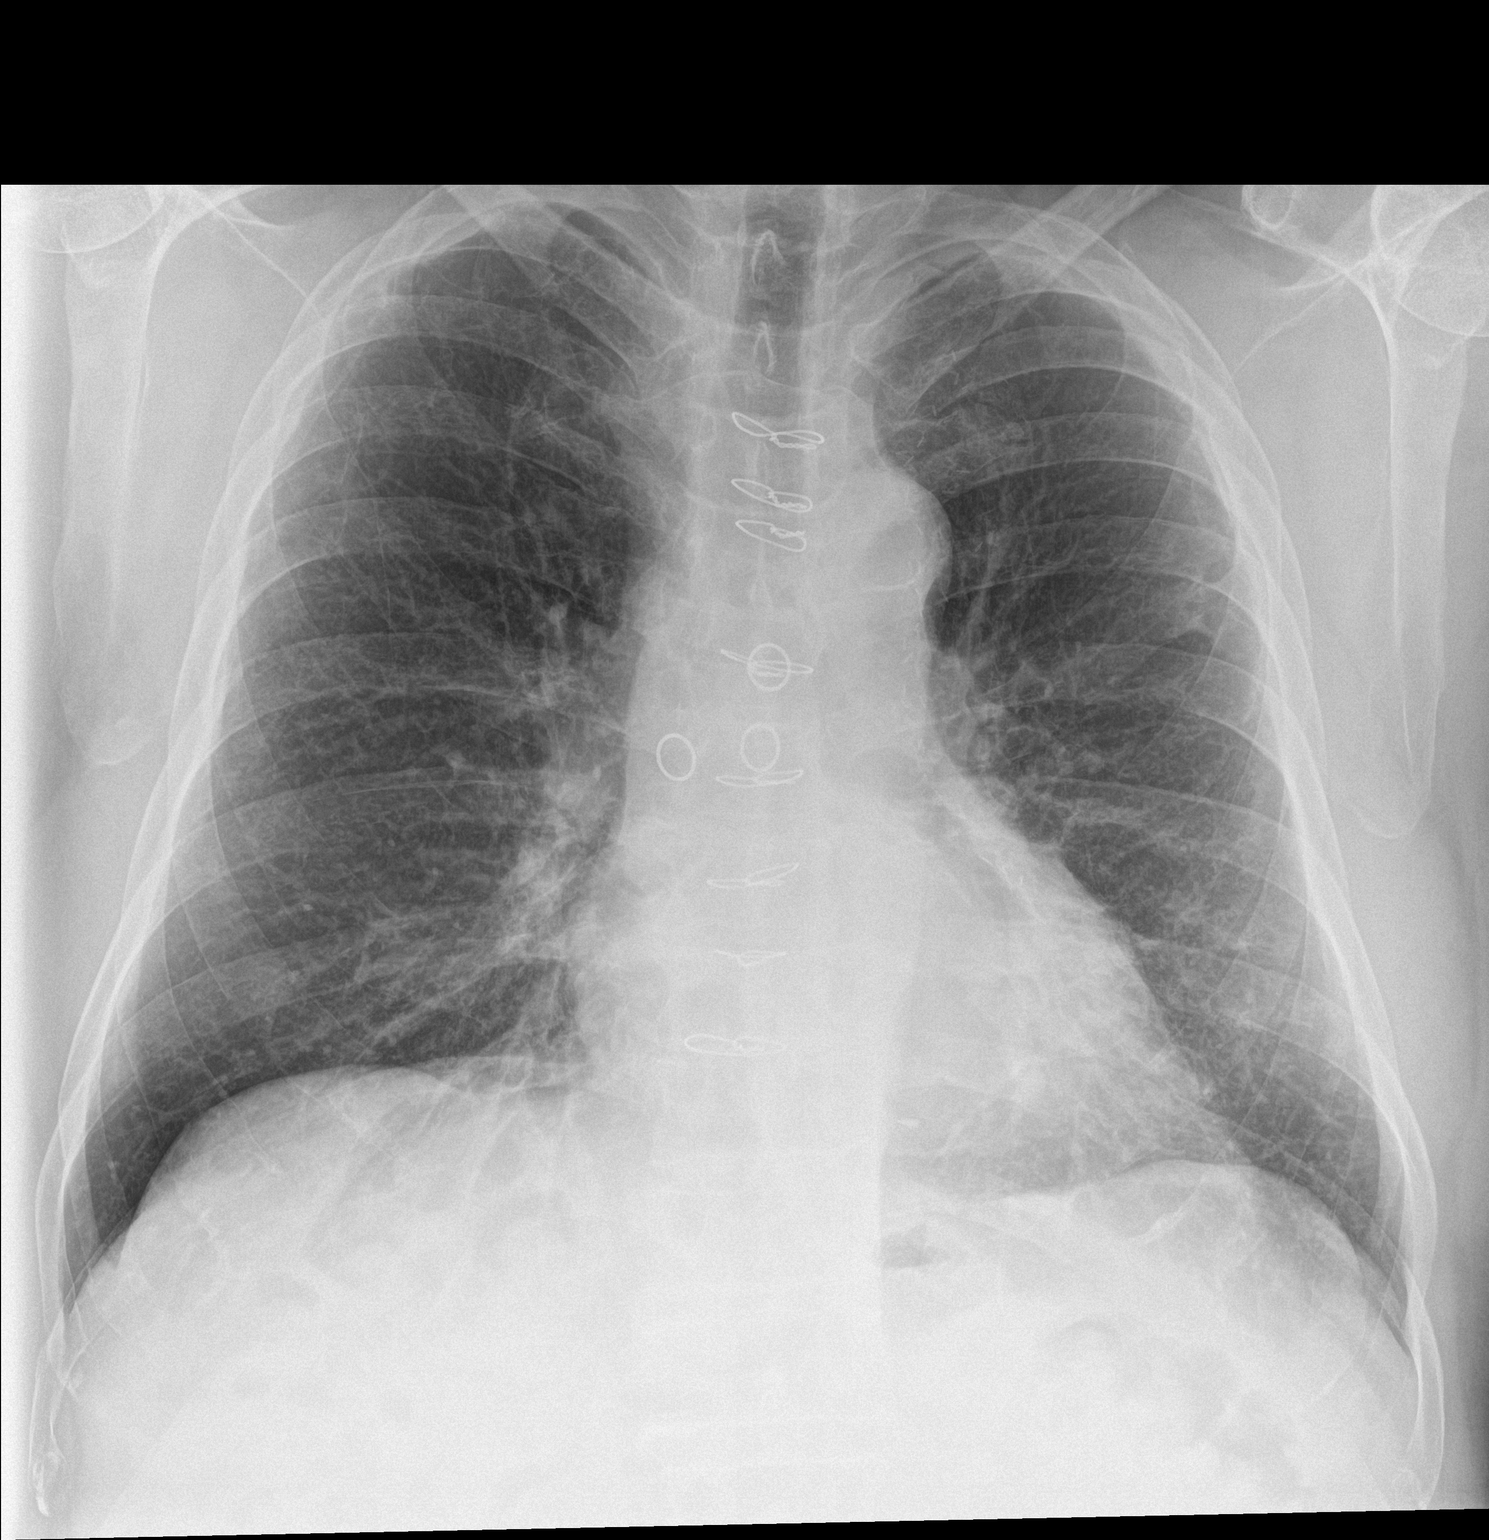

[chest lat]
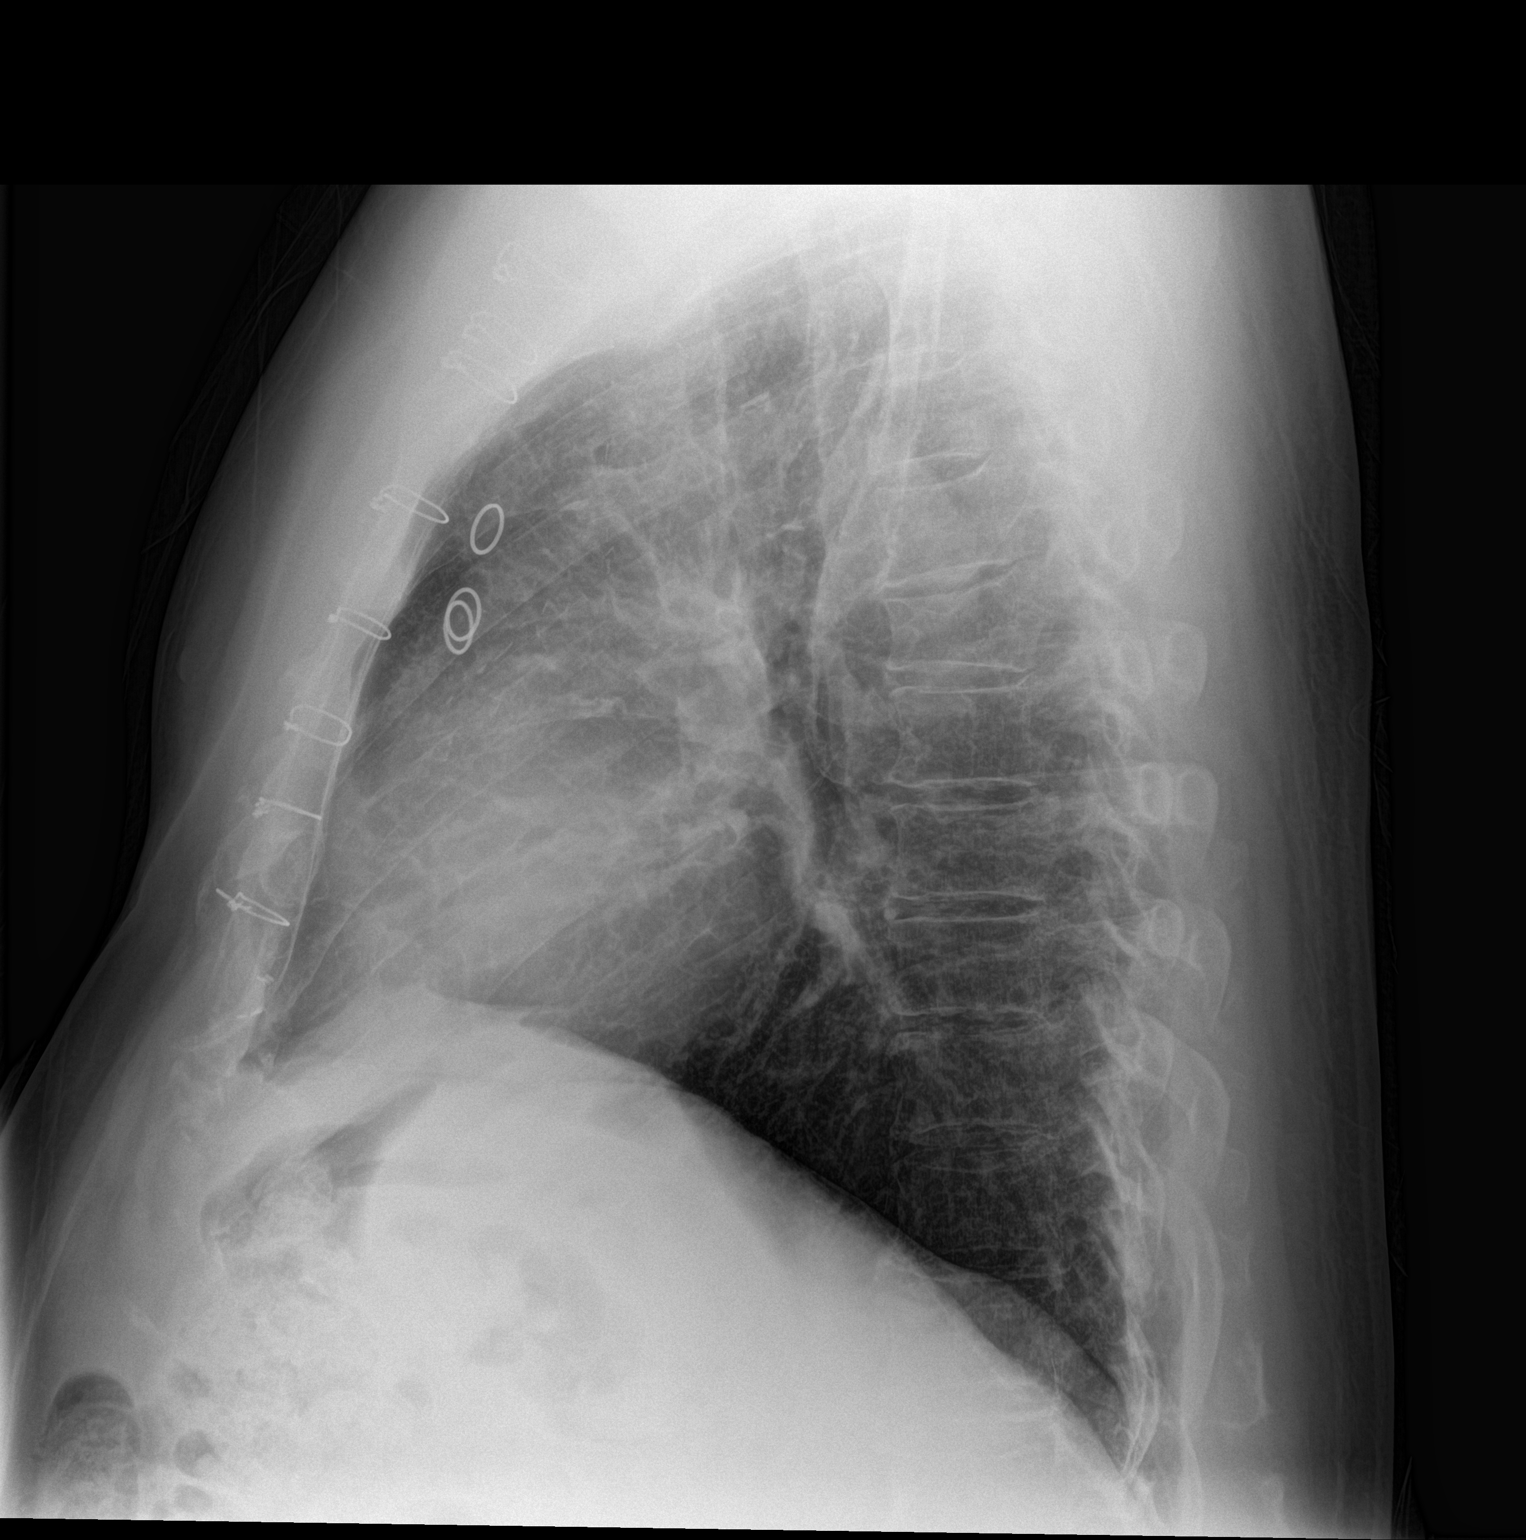

[2 of 2 positions shown; findings below may reference images not displayed]

FINDINGS: Prior CABG. Heart and mediastinal contours are within normal limits.
No focal opacities or effusions. No acute bony abnormality.
IMPRESSION: No active cardiopulmonary disease.

## 2017-08-23 ENCOUNTER — Other Ambulatory Visit: Payer: Self-pay | Admitting: Cardiovascular Disease

## 2017-08-23 DIAGNOSIS — I739 Peripheral vascular disease, unspecified: Secondary | ICD-10-CM

## 2017-08-27 ENCOUNTER — Ambulatory Visit (INDEPENDENT_AMBULATORY_CARE_PROVIDER_SITE_OTHER): Payer: Medicare Other

## 2017-08-27 DIAGNOSIS — I739 Peripheral vascular disease, unspecified: Secondary | ICD-10-CM

## 2017-09-02 ENCOUNTER — Telehealth: Payer: Self-pay | Admitting: *Deleted

## 2017-09-02 DIAGNOSIS — I739 Peripheral vascular disease, unspecified: Secondary | ICD-10-CM

## 2017-09-02 NOTE — Telephone Encounter (Signed)
Patient made aware of results and verbalized understanding. ° °Orders placed.  °

## 2017-09-02 NOTE — Telephone Encounter (Signed)
-----   Message from Wellington Hampshire, MD sent at 08/30/2017  5:31 PM EDT ----- Stable ABI and patent right popliteal artery stent.  Repeat studies in 1 year.

## 2017-10-22 DIAGNOSIS — Z23 Encounter for immunization: Secondary | ICD-10-CM | POA: Diagnosis not present

## 2017-11-19 DIAGNOSIS — D2261 Melanocytic nevi of right upper limb, including shoulder: Secondary | ICD-10-CM | POA: Diagnosis not present

## 2017-11-19 DIAGNOSIS — L821 Other seborrheic keratosis: Secondary | ICD-10-CM | POA: Diagnosis not present

## 2017-11-19 DIAGNOSIS — D2271 Melanocytic nevi of right lower limb, including hip: Secondary | ICD-10-CM | POA: Diagnosis not present

## 2017-11-19 DIAGNOSIS — T63421A Toxic effect of venom of ants, accidental (unintentional), initial encounter: Secondary | ICD-10-CM | POA: Diagnosis not present

## 2017-11-19 DIAGNOSIS — D225 Melanocytic nevi of trunk: Secondary | ICD-10-CM | POA: Diagnosis not present

## 2017-11-19 DIAGNOSIS — D2272 Melanocytic nevi of left lower limb, including hip: Secondary | ICD-10-CM | POA: Diagnosis not present

## 2017-11-19 DIAGNOSIS — D2262 Melanocytic nevi of left upper limb, including shoulder: Secondary | ICD-10-CM | POA: Diagnosis not present

## 2017-11-19 DIAGNOSIS — Z08 Encounter for follow-up examination after completed treatment for malignant neoplasm: Secondary | ICD-10-CM | POA: Diagnosis not present

## 2017-11-19 DIAGNOSIS — Z85828 Personal history of other malignant neoplasm of skin: Secondary | ICD-10-CM | POA: Diagnosis not present

## 2017-12-05 DIAGNOSIS — I6381 Other cerebral infarction due to occlusion or stenosis of small artery: Secondary | ICD-10-CM | POA: Insufficient documentation

## 2017-12-18 DIAGNOSIS — J40 Bronchitis, not specified as acute or chronic: Secondary | ICD-10-CM | POA: Diagnosis not present

## 2018-01-08 ENCOUNTER — Ambulatory Visit (INDEPENDENT_AMBULATORY_CARE_PROVIDER_SITE_OTHER): Payer: Medicare Other

## 2018-01-08 DIAGNOSIS — I6523 Occlusion and stenosis of bilateral carotid arteries: Secondary | ICD-10-CM

## 2018-01-09 ENCOUNTER — Telehealth: Payer: Self-pay | Admitting: *Deleted

## 2018-01-09 DIAGNOSIS — I6523 Occlusion and stenosis of bilateral carotid arteries: Secondary | ICD-10-CM

## 2018-01-09 NOTE — Telephone Encounter (Signed)
Patient made aware of results and verbalized understanding. Repeat orders placed.    

## 2018-01-09 NOTE — Telephone Encounter (Signed)
-----   Message from Wellington Hampshire, MD sent at 01/08/2018 10:31 AM EST ----- Inform patient that carotid Doppler showed mild nonobstructive disease bilaterally.  Recommend repeat study in 2 years.

## 2018-01-17 ENCOUNTER — Encounter: Payer: Self-pay | Admitting: Nurse Practitioner

## 2018-01-17 ENCOUNTER — Ambulatory Visit (INDEPENDENT_AMBULATORY_CARE_PROVIDER_SITE_OTHER): Payer: Medicare Other | Admitting: Nurse Practitioner

## 2018-01-17 VITALS — BP 130/72 | HR 62 | Ht 72.0 in | Wt 251.0 lb

## 2018-01-17 DIAGNOSIS — E785 Hyperlipidemia, unspecified: Secondary | ICD-10-CM | POA: Diagnosis not present

## 2018-01-17 DIAGNOSIS — E119 Type 2 diabetes mellitus without complications: Secondary | ICD-10-CM | POA: Diagnosis not present

## 2018-01-17 DIAGNOSIS — Z794 Long term (current) use of insulin: Secondary | ICD-10-CM

## 2018-01-17 DIAGNOSIS — I6523 Occlusion and stenosis of bilateral carotid arteries: Secondary | ICD-10-CM | POA: Diagnosis not present

## 2018-01-17 DIAGNOSIS — I2510 Atherosclerotic heart disease of native coronary artery without angina pectoris: Secondary | ICD-10-CM

## 2018-01-17 DIAGNOSIS — I739 Peripheral vascular disease, unspecified: Secondary | ICD-10-CM

## 2018-01-17 DIAGNOSIS — I10 Essential (primary) hypertension: Secondary | ICD-10-CM

## 2018-01-17 NOTE — Progress Notes (Signed)
Office Visit    Patient Name: Derek Hogan Date of Encounter: 01/17/2018  Primary Care Provider:  Kirk Ruths, MD Primary Cardiologist:  Kathlyn Sacramento, MD  Chief Complaint    73 year old male with a history of coronary artery disease status post remote CABG, peripheral arterial disease, questionable TIA, remote tobacco abuse, diabetes, carotid arterial disease status post right carotid endarterectomy, hypertension, and hyperlipidemia, who presents for annual follow-up related to the above.  Past Medical History    Past Medical History:  Diagnosis Date  . CAD, ARTERY BYPASS GRAFT    a. 1996 s/p CABG x 4 (LIMA->LAD, VG->D1, VG->OM3, VG->RPDA; b. 12/2016 [facility]): EF 47%, anteroapical and inf infarcts w/ anteroapical peri-infarct ischemia; c. 01/2017: Cath: LM 5m/d, LAD 100p/m, D1 90ost, 70p, LCX 80ost/p, 111m, RCA 35m, 75d, RPDA 95ost, VG->RPDA 30p/m, VG->OM3 nl, LIMA->LAD nl, VG->D1 nl.  . Carotid arterial disease ([place])    a. s/p remote R CEA (Dr. Drucie Opitz); b. 01/2018 Carotid U/S: RICA 2-42%, RCCA <68%, LICA 3-41%. F/u 12 mos.  . Dyslipidemia   . ERECTILE DYSFUNCTION 06/09/2008  . Erectile dysfunction   . GERD (gastroesophageal reflux disease)   . Heart attack ([place]) 1996  . HYPERLIPIDEMIA-MIXED 06/09/2008  . Hypertension   . HYPERTENSION, UNSPECIFIED 06/09/2008  . Orthostatic hypotension   . PVD (peripheral vascular disease) ([place])    a. 02/2014 s/p DBA/stent to prox R popliteal; b.  08/2017 Vasc u/s/TBI: Patent mid R pop stent. LSFA 30-40%. TBI: R 0.79, L 0.51 - no significant change, f/u 1 yr.  . Type I diabetes mellitus ([place]) dx'd 08/20/1966  . UNSPECIFIED PERIPHERAL VASCULAR DISEASE 10/06/2008  . Viral gastroenteritis    04/19/2010 improved  . Weakness    Past Surgical History:  Procedure Laterality Date  . [place]; ~ 2010  . CAROTID ENDARTERECTOMY Right   . CATARACT EXTRACTION, BILATERAL Bilateral   . CORONARY ANGIOPLASTY    .  CORONARY ARTERY BYPASS GRAFT  1996   "CABG X4"  . LEFT HEART CATH AND CORS/GRAFTS ANGIOGRAPHY N/A 01/11/2017   Procedure: LEFT HEART CATH AND CORS/GRAFTS ANGIOGRAPHY;  Surgeon: Jolaine Artist, MD;  Location: [facility];  Service: Cardiovascular;  Laterality: N/A;  . MOHS SURGERY    . PERIPHERAL VASCULAR CATHETERIZATION N/A 12/15/2014   Procedure: Abdominal Aortogram;  Surgeon: Wellington Hampshire, MD;  Location: [facility];  Service: Cardiovascular;  Laterality: N/A;  . REFRACTIVE SURGERY Right    "before cataract OR"    Allergies  No Known Allergies  History of Present Illness    72 year old male with the above complex past medical history including coronary artery disease status post inferior MI and four-vessel bypass in 1996, hypertension, hyperlipidemia, diabetes, peripheral arterial disease status post drug-eluting balloon angioplasty and self-expanding stent placement to the proximal right popliteal artery in 2016, carotid arterial disease status post right CEA, obesity, and questionable TIA in March 2016.  He was last seen in cardiology clinic in November 2018, at which time he was doing reasonably well.  He subsequently underwent stress testing through the [facility], which was abnormal and showed anteroapical ischemia.  As result, he was seen by Dr. Haroldine Laws, and underwent diagnostic catheterization revealing native multivessel disease with 4 4 patent grafts.  He has been medically managed since.  Most recent peripheral vascular studies in July showed patent mid right popliteal stent with 30 to 40% left SFA stenosis.  TBI's were stable compared to the prior year.  He recently underwent carotid ultrasound which showed no significant plaque.  He says the [facility] just recently repeated his lower extremity arterial Dopplers and he was again told that they were stable.  He has done well since last year.  He walks or rides a stationary bike about 30 minutes a day.  He denies chest  pain, palpitations, dyspnea, PND, orthopnea, dizziness, syncope, edema, or early satiety.  He has not experienced any claudication over the past year.  Home Medications    Prior to Admission medications   Medication Sig Start Date End Date Taking? Authorizing Provider  aspirin 81 MG tablet Take 162 mg by mouth at bedtime.     [provider]  atorvastatin (LIPITOR) 80 MG tablet Take 1 tablet (80 mg total) by mouth daily. 05/23/11   Bensimhon, Shaune Pascal, MD  insulin aspart (NOVOLOG) 100 unit/mL injection Inject into the skin continuous. Insulin Pump    [provider]  lisinopril (PRINIVIL,ZESTRIL) 2.5 MG tablet Take 2.5 mg by mouth at bedtime.  07/18/10   Bensimhon, Shaune Pascal, MD  nitroGLYCERIN (NITROSTAT) 0.4 MG SL tablet Place 0.4 mg under the tongue every 5 (five) minutes as needed for chest pain.  09/21/15   [provider]  omeprazole (PRILOSEC) 20 MG capsule Take 20 mg by mouth every morning.     [provider]  sertraline (ZOLOFT) 50 MG tablet Take 50 mg by mouth daily.    [provider]  traZODone (DESYREL) 50 MG tablet Take 25 mg by mouth at bedtime.    [provider]    Review of Systems    He denies chest pain, palpitations, dyspnea, pnd, orthopnea, n, v, dizziness, syncope, edema, weight gain, claudication, or early satiety.  All other systems reviewed and are otherwise negative except as noted above.  Physical Exam    VS:  BP 130/72 (BP Location: Left Arm, Patient Position: Sitting, Cuff Size: Normal)   Pulse 62   Ht 6' (1.829 m)   Wt 251 lb (113.9 kg)   BMI 34.04 kg/m  , BMI Body mass index is 34.04 kg/m. GEN: Well nourished, well developed, in no acute distress. HEENT: normal. Neck: Supple, no JVD, carotid bruits, or masses. Cardiac: RRR, no murmurs, rubs, or gallops. No clubbing, cyanosis, edema.  Radials/PT 1+ and equal bilaterally.  Respiratory:  Respirations regular and unlabored, clear to auscultation  bilaterally. GI: Soft, nontender, nondistended, BS + x 4. MS: no deformity or atrophy. Skin: warm and dry, no rash. Neuro:  Strength and sensation are intact. Psych: Normal affect.  Accessory Clinical Findings    ECG personally reviewed by me today -regular sinus rhythm, 62, leftward axis with prior inferior infarct- no acute changes.  Assessment & Plan    1.  Coronary artery disease: Status post remote inferior MI and CABG times 05/25/1994.  He underwent catheterization in December 2018 following abnormal stress test at the [place].  This showed 4 of 4 patent grafts without targets for intervention.  Patient has been doing well over the past year without chest pain or dyspnea and rides a stationary bike 30 minutes a day.  He remains on aspirin, statin, and ARB therapy.  2.  Essential hypertension: Stable on ARB therapy.  3.  Hyperlipidemia: He remains on high potency statin therapy with lipids followed at the [facility] [place] in [place].  4.  Type 2 diabetes mellitus: Insulin pump per the [place].  5.  Peripheral arterial disease: Status post prior right popliteal stenting.  Lower extremity Doppler/TBI  in July were stable.  Patient says he had repeat studies done at the [place] [facility] within the past couple of weeks and have asked him to forward [place] those studies as our plan was to follow-up studies in 1 year from July of and we could potentially push this back to 1 year from now.  He remains on aspirin and statin therapy.  6.  Carotid arterial disease: Status post right CEA.  Recent carotid ultrasound showed stable, minimal plaque.  Continue aspirin and statin.  7.  Disposition: Follow-up with Dr. Fletcher Anon in 1 year.  Plan for follow-up peripheral vascular and carotid studies as previously outlined.  Murray Hodgkins, NP 01/17/2018, 9:55 AM

## 2018-01-17 NOTE — Patient Instructions (Signed)
Medication Instructions:  Your physician recommends that you continue on your current medications as directed. Please refer to the Current Medication list given to you today.  If you need a refill on your cardiac medications before your next appointment, please call your pharmacy.   Lab work: None ordered   If you have labs (blood work) drawn today and your tests are completely normal, you will receive your results only by: Marland Kitchen MyChart Message (if you have MyChart) OR . A paper copy in the mail If you have any lab test that is abnormal or we need to change your treatment, we will call you to review the results.  Testing/Procedures: None ordered   Follow-Up: At [facility], you and your health needs are our priority.  As part of our continuing mission to provide you with exceptional heart care, we have created designated Provider Care Teams.  These Care Teams include your primary Cardiologist (physician) and Advanced Practice Providers (APPs -  Physician Assistants and Nurse Practitioners) who all work together to provide you with the care you need, when you need it. You will need a follow up appointment in 1 years.  Please call our office 2 months in advance to schedule this appointment.  You may see Kathlyn Sacramento, MD or one of the following Advanced Practice Providers on your designated Care Team:   Murray Hodgkins, NP Christell Faith, PA-C . Marrianne Mood, PA-C

## 2018-02-15 ENCOUNTER — Ambulatory Visit
Admission: EM | Admit: 2018-02-15 | Discharge: 2018-02-15 | Disposition: A | Discharge status: Home / Self Care | Payer: Medicare Other | Attending: Family Medicine | Admitting: Family Medicine

## 2018-02-15 ENCOUNTER — Other Ambulatory Visit: Payer: Self-pay

## 2018-02-15 ENCOUNTER — Encounter: Payer: Self-pay | Admitting: Gynecology

## 2018-02-15 DIAGNOSIS — B9789 Other viral agents as the cause of diseases classified elsewhere: Secondary | ICD-10-CM | POA: Diagnosis not present

## 2018-02-15 DIAGNOSIS — J069 Acute upper respiratory infection, unspecified: Secondary | ICD-10-CM | POA: Insufficient documentation

## 2018-02-15 DIAGNOSIS — Z87891 Personal history of nicotine dependence: Secondary | ICD-10-CM | POA: Diagnosis not present

## 2018-02-15 MED ORDER — PREDNISONE 20 MG PO TABS: ORAL_TABLET | ORAL | 0 refills | Status: DC

## 2018-02-15 NOTE — ED Triage Notes (Signed)
Er patient with cough x 4 days

## 2018-02-15 NOTE — ED Provider Notes (Signed)
[facility]    CSN: [id number] Arrival date & time: 02/15/18  1010     History   Chief Complaint Chief Complaint  Patient presents with  . Cough    HPI Derek Hogan is a 73 y.o. male.   The history is provided by the patient.  URI  Presenting symptoms: cough   Severity:  Moderate Onset quality:  Sudden Duration:  4 days Timing:  Constant Progression:  Unchanged Chronicity:  New Relieved by:  None tried Ineffective treatments:  None tried Associated symptoms: no wheezing   Risk factors: chronic cardiac disease and sick contacts     Past Medical History:  Diagnosis Date  . CAD, ARTERY BYPASS GRAFT    a. 1996 s/p CABG x 4 (LIMA->LAD, VG->D1, VG->OM3, VG->RPDA; b. 12/2016 [facility]): EF 47%, anteroapical and inf infarcts w/ anteroapical peri-infarct ischemia; c. 01/2017: Cath: LM 85m/d, LAD 100p/m, D1 90ost, 70p, LCX 80ost/p, 127m, RCA 44m, 75d, RPDA 95ost, VG->RPDA 30p/m, VG->OM3 nl, LIMA->LAD nl, VG->D1 nl.  . Carotid arterial disease ([place])    a. s/p remote R CEA (Dr. Drucie Opitz); b. 01/2018 Carotid U/S: RICA 6-37%, RCCA <85%, LICA 8-85%. F/u 12 mos.  . Dyslipidemia   . ERECTILE DYSFUNCTION 06/09/2008  . Erectile dysfunction   . GERD (gastroesophageal reflux disease)   . Heart attack ([place]) 1996  . HYPERLIPIDEMIA-MIXED 06/09/2008  . Hypertension   . HYPERTENSION, UNSPECIFIED 06/09/2008  . Orthostatic hypotension   . PVD (peripheral vascular disease) ([place])    a. 02/2014 s/p DBA/stent to prox R popliteal; b.  08/2017 Vasc u/s/TBI: Patent mid R pop stent. LSFA 30-40%. TBI: R 0.79, L 0.51 - no significant change, f/u 1 yr.  . Type I diabetes mellitus ([place]) dx'd 08/20/1966  . UNSPECIFIED PERIPHERAL VASCULAR DISEASE 10/06/2008  . Viral gastroenteritis    04/19/2010 improved  . Weakness     Patient Active Problem List   Diagnosis Date Noted  . Peripheral vascular angioplasty status   . PAD (peripheral artery disease) ([place]) 12/13/2014  . Malaise and fatigue  06/16/2012  . Cough 12/16/2011  . Precordial pain 01/05/2011  . Syncope 07/25/2010  . Leukocytosis 07/25/2010  . DIZZINESS 05/18/2009  . MYOCARDIAL PERFUSION SCAN, WITH STRESS TEST, ABNORMAL 12/20/2008  . SHORTNESS OF BREATH 11/25/2008  . Diabetes mellitus, insulin dependent (IDDM), controlled ([place]) 10/01/2008  . HYPOTENSION, ORTHOSTATIC 07/28/2008  . Hyperlipidemia LDL goal <70 06/09/2008  . ERECTILE DYSFUNCTION 06/09/2008  . Essential hypertension 06/09/2008  . Coronary atherosclerosis of native coronary artery 06/09/2008  . Carotid stenosis 06/09/2008  . HYPOTENSION, UNSPECIFIED 06/09/2008    Past Surgical History:  Procedure Laterality Date  . [place]; ~ 2010  . CAROTID ENDARTERECTOMY Right   . CATARACT EXTRACTION, BILATERAL Bilateral   . CORONARY ANGIOPLASTY    . CORONARY ARTERY BYPASS GRAFT  1996   "CABG X4"  . LEFT HEART CATH AND CORS/GRAFTS ANGIOGRAPHY N/A 01/11/2017   Procedure: LEFT HEART CATH AND CORS/GRAFTS ANGIOGRAPHY;  Surgeon: Jolaine Artist, MD;  Location: [facility];  Service: Cardiovascular;  Laterality: N/A;  . MOHS SURGERY    . PERIPHERAL VASCULAR CATHETERIZATION N/A 12/15/2014   Procedure: Abdominal Aortogram;  Surgeon: Wellington Hampshire, MD;  Location: [facility];  Service: Cardiovascular;  Laterality: N/A;  . REFRACTIVE SURGERY Right    "before cataract OR"       Home Medications    Prior to Admission medications   Medication Sig Start Date End Date Taking?  Authorizing Provider  aspirin 81 MG tablet Take 162 mg by mouth at bedtime.    Yes [provider]  atorvastatin (LIPITOR) 80 MG tablet Take 1 tablet (80 mg total) by mouth daily. 05/23/11  Yes Bensimhon, Shaune Pascal, MD  insulin aspart (NOVOLOG) 100 unit/mL injection Inject into the skin continuous. Insulin Pump   Yes [provider]  losartan (COZAAR) 50 MG tablet Take 50 mg by mouth daily.  12/05/17  Yes [provider]    nitroGLYCERIN (NITROSTAT) 0.4 MG SL tablet Place 0.4 mg under the tongue every 5 (five) minutes as needed for chest pain.  09/21/15  Yes [provider]  omeprazole (PRILOSEC) 20 MG capsule Take 20 mg by mouth every morning.    Yes [provider]  sertraline (ZOLOFT) 50 MG tablet Take 50 mg by mouth daily.   Yes [provider]  traZODone (DESYREL) 50 MG tablet Take 25 mg by mouth at bedtime.   Yes [provider]  predniSONE (DELTASONE) 20 MG tablet 3 tabs po once day 1, then 2 tabs po qd x 2 days, then 1 tab po qd x 2 days, then half a tab po qd x 2 days 02/15/18   Norval Gable, MD    Family History Family History  Problem Relation Age of Onset  . Coronary artery disease Mother   . Diabetes Mother   . Stroke Other   . Coronary artery disease Father   . Diabetes Father     Social History Social History   Tobacco Use  . Smoking status: Former Smoker    Packs/day: 2.50    Years: 35.00    Pack years: 87.50    Types: Cigarettes    Last attempt to quit: 02/05/1994    Years since quitting: 24.0  . Smokeless tobacco: Never Used  Substance Use Topics  . Alcohol use: Yes  . Drug use: No     Allergies   Patient has no known allergies.   Review of Systems Review of Systems  Respiratory: Positive for cough. Negative for wheezing.      Physical Exam Triage Vital Signs ED Triage Vitals [02/15/18 1046]  Enc Vitals Group     BP (!) 151/69     Pulse Rate 77     Resp 18     Temp 98.3 F (36.8 C)     Temp Source Oral     SpO2 97 %     Weight 250 lb (113.4 kg)     Height 6' (1.829 m)     Head Circumference      Peak Flow      Pain Score 0     Pain Loc      Pain Edu?      Excl. in [place]?    No data found.  Updated Vital Signs BP (!) 151/69 (BP Location: Left Arm)   Pulse 77   Temp 98.3 F (36.8 C) (Oral)   Resp 18   Ht 6' (1.829 m)   Wt 113.4 kg   SpO2 97%   BMI 33.91 kg/m   Visual Acuity Right Eye Distance:   Left Eye  Distance:   Bilateral Distance:    Right Eye Near:   Left Eye Near:    Bilateral Near:     Physical Exam Vitals signs and nursing note reviewed.  Constitutional:      General: He is not in acute distress.    Appearance: He is well-developed. He is not toxic-appearing  or diaphoretic.  HENT:     Head: Normocephalic and atraumatic.     Nose: Nose normal.     Mouth/Throat:     Pharynx: Uvula midline. No oropharyngeal exudate.     Tonsils: No tonsillar abscesses.  Eyes:     General: No scleral icterus.       Right eye: No discharge.        Left eye: No discharge.  Neck:     Musculoskeletal: Normal range of motion and neck supple.     Thyroid: No thyromegaly.     Trachea: No tracheal deviation.  Cardiovascular:     Rate and Rhythm: Normal rate and regular rhythm.     Heart sounds: Normal heart sounds.  Pulmonary:     Effort: Pulmonary effort is normal. No respiratory distress.     Breath sounds: Normal breath sounds. No stridor. No wheezing, rhonchi or rales.  Chest:     Chest wall: No tenderness.  Lymphadenopathy:     Cervical: No cervical adenopathy.  Skin:    General: Skin is warm and dry.     Findings: No rash.  Neurological:     Mental Status: He is alert.      UC Treatments / Results  Labs (all labs ordered are listed, but only abnormal results are displayed) Labs Reviewed - No data to display  EKG None  Radiology No results found.  Procedures Procedures (including critical care time)  Medications Ordered in UC Medications - No data to display  Initial Impression / Assessment and Plan / UC Course  I have reviewed the triage vital signs and the nursing notes.  Pertinent labs & imaging results that were available during my care of the patient were reviewed by me and considered in my medical decision making (see chart for details).      Final Clinical Impressions(s) / UC Diagnoses   Final diagnoses:  Viral URI with cough    ED Prescriptions     Medication Sig Dispense Auth. Provider   predniSONE (DELTASONE) 20 MG tablet 3 tabs po once day 1, then 2 tabs po qd x 2 days, then 1 tab po qd x 2 days, then half a tab po qd x 2 days 10 tablet Norval Gable, MD     1. Lab results and diagnosis reviewed with patient 2. rx as per orders above; reviewed possible side effects, interactions, risks and benefits  3. Recommend supportive treatment with otc meds prn, rest, fluids 4. Follow-up prn if symptoms worsen or don't improve   Controlled Substance Prescriptions [place] Controlled Substance Registry consulted? Not Applicable   Norval Gable, MD 02/15/18 1228

## 2018-07-07 DIAGNOSIS — R220 Localized swelling, mass and lump, head: Secondary | ICD-10-CM | POA: Diagnosis not present

## 2018-07-07 DIAGNOSIS — K130 Diseases of lips: Secondary | ICD-10-CM | POA: Diagnosis not present

## 2018-07-31 ENCOUNTER — Other Ambulatory Visit: Payer: Self-pay

## 2018-07-31 ENCOUNTER — Ambulatory Visit (INDEPENDENT_AMBULATORY_CARE_PROVIDER_SITE_OTHER): Payer: Medicare Other | Admitting: Podiatry

## 2018-07-31 ENCOUNTER — Encounter: Payer: Self-pay | Admitting: Podiatry

## 2018-07-31 DIAGNOSIS — S9031XA Contusion of right foot, initial encounter: Secondary | ICD-10-CM

## 2018-07-31 DIAGNOSIS — S9030XA Contusion of unspecified foot, initial encounter: Secondary | ICD-10-CM | POA: Insufficient documentation

## 2018-07-31 NOTE — Progress Notes (Signed)
This patient presents the office with chief complaint of a blackened nonpainful area of skin under his big toe joint right foot.  He says approximately 4 weeks ago he developed a blister wearing sandals.  He states that it has become blackened but he has not experienced any pain or drainage.  He presents the office today for an evaluation and treatment of this black skin lesion.   General Appearance  Alert, conversant and in no acute stress.  Vascular  Dorsalis pedis and posterior pulses are palpable  bilaterally.  Capillary return is within normal limits  Bilaterally. Temperature is within normal limits  Bilaterally  Neurologic  Senn-Weinstein monofilament wire test within normal limits  bilaterally. Muscle power  Within normal limits bilaterally.  Nails  Normal nails noted with no infection or drainage noted.  Orthopedic  No limitations of motion of motion feet bilaterally.  No crepitus or effusions noted.  No bony pathology or digital deformities noted.  Skin  normotropic skin with no porokeratosis noted bilaterally.  No signs of infections or ulcers noted.  Black non fluctuant painless skin lesion under big toe joint right foot.   Contusion/Healing blister right foot.  ROV  Nail surgery.  The black skin lesion was removed using a dremel tool.  He has normal skin at the contusion/blister site.  RTC prn.     Gardiner Barefoot DPM .

## 2018-08-07 ENCOUNTER — Telehealth: Payer: Self-pay | Admitting: Cardiovascular Disease

## 2018-08-07 NOTE — Telephone Encounter (Signed)
Will route to the provider Christell Faith, PA to advise if the patient's appt should be converted from in office to virtual due to his current symptoms.

## 2018-08-07 NOTE — Telephone Encounter (Signed)
Called to give the pt Derek Hogan's recommendation regarding his upcoming in office appt. lmtcb.

## 2018-08-07 NOTE — Telephone Encounter (Signed)
Spoke with the pt. Pt sts that he was seen by the [facility] today and had lab work and a repeat Carotid Dopp. Pt sts that he feels as though he is doing ok from a cardiac standpoint and would like to cancel his appt with Thurmond Butts. appt cancelled. Adv the pt to f/u as planned and to contact the office sooner if symptoms develop. Pt verbalized understanding.

## 2018-08-07 NOTE — Telephone Encounter (Signed)
Patient calling to schedule a sooner f/u appointment  States he has been experiencing dizziness and SOB, he is also having some chills, aches but no fever and no cough Patient is scheduled for 7/7 in office with R Dunn Please advise if patient may come to office - is aware he may have to switch to virtual

## 2018-08-07 NOTE — Telephone Encounter (Signed)
Given his symptoms of dizziness and SOB, it would be best if he keeps this as an in person visit for EKG and likely labs. However, if he develops fever or further symptoms including sore throat, decreased taste, or cough this will need to be transitioned to a virtual visit and I would recommend he then contact his PCP for testing.

## 2018-08-12 ENCOUNTER — Ambulatory Visit: Payer: Medicare Other | Admitting: Physician Assistant

## 2018-10-20 DIAGNOSIS — Z08 Encounter for follow-up examination after completed treatment for malignant neoplasm: Secondary | ICD-10-CM | POA: Diagnosis not present

## 2018-10-20 DIAGNOSIS — L821 Other seborrheic keratosis: Secondary | ICD-10-CM | POA: Diagnosis not present

## 2018-10-20 DIAGNOSIS — L729 Follicular cyst of the skin and subcutaneous tissue, unspecified: Secondary | ICD-10-CM | POA: Diagnosis not present

## 2018-10-20 DIAGNOSIS — C44629 Squamous cell carcinoma of skin of left upper limb, including shoulder: Secondary | ICD-10-CM | POA: Diagnosis not present

## 2018-10-20 DIAGNOSIS — D485 Neoplasm of uncertain behavior of skin: Secondary | ICD-10-CM | POA: Diagnosis not present

## 2018-10-20 DIAGNOSIS — L570 Actinic keratosis: Secondary | ICD-10-CM | POA: Diagnosis not present

## 2018-10-20 DIAGNOSIS — D0462 Carcinoma in situ of skin of left upper limb, including shoulder: Secondary | ICD-10-CM | POA: Diagnosis not present

## 2018-10-20 DIAGNOSIS — X32XXXA Exposure to sunlight, initial encounter: Secondary | ICD-10-CM | POA: Diagnosis not present

## 2018-10-20 DIAGNOSIS — D692 Other nonthrombocytopenic purpura: Secondary | ICD-10-CM | POA: Diagnosis not present

## 2018-10-20 DIAGNOSIS — Z85828 Personal history of other malignant neoplasm of skin: Secondary | ICD-10-CM | POA: Diagnosis not present

## 2018-11-03 DIAGNOSIS — D485 Neoplasm of uncertain behavior of skin: Secondary | ICD-10-CM | POA: Diagnosis not present

## 2018-11-08 DIAGNOSIS — Z23 Encounter for immunization: Secondary | ICD-10-CM | POA: Diagnosis not present

## 2018-11-17 DIAGNOSIS — D0462 Carcinoma in situ of skin of left upper limb, including shoulder: Secondary | ICD-10-CM | POA: Diagnosis not present

## 2018-11-17 DIAGNOSIS — D485 Neoplasm of uncertain behavior of skin: Secondary | ICD-10-CM | POA: Diagnosis not present

## 2018-12-02 DIAGNOSIS — C44629 Squamous cell carcinoma of skin of left upper limb, including shoulder: Secondary | ICD-10-CM | POA: Diagnosis not present

## 2018-12-02 DIAGNOSIS — D0462 Carcinoma in situ of skin of left upper limb, including shoulder: Secondary | ICD-10-CM | POA: Diagnosis not present

## 2019-01-19 ENCOUNTER — Ambulatory Visit: Payer: Medicare Other | Admitting: Family

## 2019-01-27 ENCOUNTER — Other Ambulatory Visit (HOSPITAL_COMMUNITY): Payer: Self-pay | Admitting: Cardiovascular Disease

## 2019-01-27 DIAGNOSIS — I739 Peripheral vascular disease, unspecified: Secondary | ICD-10-CM

## 2019-02-09 ENCOUNTER — Ambulatory Visit (INDEPENDENT_AMBULATORY_CARE_PROVIDER_SITE_OTHER): Payer: Medicare Other

## 2019-02-09 ENCOUNTER — Other Ambulatory Visit: Payer: Self-pay | Admitting: Cardiovascular Disease

## 2019-02-09 ENCOUNTER — Other Ambulatory Visit: Payer: Self-pay

## 2019-02-09 DIAGNOSIS — I739 Peripheral vascular disease, unspecified: Secondary | ICD-10-CM | POA: Diagnosis not present

## 2019-02-09 DIAGNOSIS — I6523 Occlusion and stenosis of bilateral carotid arteries: Secondary | ICD-10-CM

## 2019-02-09 DIAGNOSIS — Z9889 Other specified postprocedural states: Secondary | ICD-10-CM

## 2019-02-16 ENCOUNTER — Other Ambulatory Visit: Payer: Self-pay

## 2019-02-16 DIAGNOSIS — I739 Peripheral vascular disease, unspecified: Secondary | ICD-10-CM

## 2019-02-16 DIAGNOSIS — I6523 Occlusion and stenosis of bilateral carotid arteries: Secondary | ICD-10-CM

## 2019-02-19 DIAGNOSIS — Z9641 Presence of insulin pump (external) (internal): Secondary | ICD-10-CM | POA: Diagnosis not present

## 2019-02-19 DIAGNOSIS — Z87891 Personal history of nicotine dependence: Secondary | ICD-10-CM | POA: Diagnosis not present

## 2019-02-19 DIAGNOSIS — E119 Type 2 diabetes mellitus without complications: Secondary | ICD-10-CM | POA: Diagnosis not present

## 2019-02-19 DIAGNOSIS — K219 Gastro-esophageal reflux disease without esophagitis: Secondary | ICD-10-CM | POA: Diagnosis not present

## 2019-02-19 DIAGNOSIS — Z20822 Contact with and (suspected) exposure to covid-19: Secondary | ICD-10-CM | POA: Diagnosis not present

## 2019-02-19 DIAGNOSIS — J40 Bronchitis, not specified as acute or chronic: Secondary | ICD-10-CM | POA: Diagnosis not present

## 2019-02-19 DIAGNOSIS — E785 Hyperlipidemia, unspecified: Secondary | ICD-10-CM | POA: Diagnosis not present

## 2019-02-19 DIAGNOSIS — I2510 Atherosclerotic heart disease of native coronary artery without angina pectoris: Secondary | ICD-10-CM | POA: Diagnosis not present

## 2019-02-19 DIAGNOSIS — I10 Essential (primary) hypertension: Secondary | ICD-10-CM | POA: Diagnosis not present

## 2019-02-24 DIAGNOSIS — Z20822 Contact with and (suspected) exposure to covid-19: Secondary | ICD-10-CM | POA: Diagnosis not present

## 2019-02-26 ENCOUNTER — Ambulatory Visit: Payer: Medicare Other | Admitting: Cardiovascular Disease

## 2019-03-11 DIAGNOSIS — M545 Low back pain: Secondary | ICD-10-CM | POA: Diagnosis not present

## 2019-03-11 DIAGNOSIS — M4856XA Collapsed vertebra, not elsewhere classified, lumbar region, initial encounter for fracture: Secondary | ICD-10-CM | POA: Diagnosis not present

## 2019-03-19 ENCOUNTER — Ambulatory Visit (INDEPENDENT_AMBULATORY_CARE_PROVIDER_SITE_OTHER): Payer: Medicare Other | Admitting: Cardiovascular Disease

## 2019-03-19 ENCOUNTER — Other Ambulatory Visit: Payer: Self-pay

## 2019-03-19 ENCOUNTER — Encounter: Payer: Self-pay | Admitting: Cardiovascular Disease

## 2019-03-19 VITALS — BP 136/58 | HR 70 | Ht 72.0 in | Wt 243.0 lb

## 2019-03-19 DIAGNOSIS — I739 Peripheral vascular disease, unspecified: Secondary | ICD-10-CM

## 2019-03-19 DIAGNOSIS — I10 Essential (primary) hypertension: Secondary | ICD-10-CM | POA: Diagnosis not present

## 2019-03-19 DIAGNOSIS — I6523 Occlusion and stenosis of bilateral carotid arteries: Secondary | ICD-10-CM | POA: Diagnosis not present

## 2019-03-19 DIAGNOSIS — I779 Disorder of arteries and arterioles, unspecified: Secondary | ICD-10-CM | POA: Diagnosis not present

## 2019-03-19 DIAGNOSIS — I2510 Atherosclerotic heart disease of native coronary artery without angina pectoris: Secondary | ICD-10-CM

## 2019-03-19 NOTE — Progress Notes (Signed)
Cardiology Office Note   Date:  03/19/2019   ID:  Derek Hogan, DOB May 09, 1945, MRN [id number]  PCP:  Derek Ruths, MD  Cardiologist:  Dr. Fletcher Hogan  Chief Complaint  Patient presents with  . office visit    pt 12 month f/u.n pt concern w/ increase heart rate and decrease in BP. Meds verbally reviewed w/ pt.      History of Present Illness: Derek Hogan is a 74 y.o. male who presents for a follow-up visit regarding coronary artery disease and peripheral arterial disease.  He has known history of CAD S/P previous inferior wall myocardial infarction followed by CABG 1996. He had questionable TIA in March 2016. He quit smoking in 1996 after CABG. He has prolonged history of diabetes currently on insulin pump, Carotid disease status post right carotid endarterectomy, hyperlipidemia and hypertension. He is known to have peripheral arterial disease.  He had drug-coated balloon angioplasty followed by self-expanding stent placement to the proximal right popliteal artery for severe claudication in November 2016. He had COVID-19 infection in January but did not require hospitalization.  He did have low blood pressure and elevated heart rate during that time but he is back to his baseline. no chest pain or shortness of breath.  No lower extremity claudication. He had an echocardiogram done at [facility] requested by the [facility] and it was reported to be normal.  Past Medical History:  Diagnosis Date  . CAD, ARTERY BYPASS GRAFT    a. 1996 s/p CABG x 4 (LIMA->LAD, VG->D1, VG->OM3, VG->RPDA; b. 12/2016 [facility]): EF 47%, anteroapical and inf infarcts w/ anteroapical peri-infarct ischemia; c. 01/2017: Cath: LM 66m/d, LAD 100p/m, D1 90ost, 70p, LCX 80ost/p, 183m, RCA 54m, 75d, RPDA 95ost, VG->RPDA 30p/m, VG->OM3 nl, LIMA->LAD nl, VG->D1 nl.  . Carotid arterial disease ([facility])    a. s/p remote R CEA (Dr. Drucie Hogan); b. 01/2018 Carotid U/S: RICA 123456, RCCA Q000111Q, LICA 123456. F/u 12 mos.  .  Dyslipidemia   . ERECTILE DYSFUNCTION 06/09/2008  . Erectile dysfunction   . GERD (gastroesophageal reflux disease)   . Heart attack ([place]) 1996  . HYPERLIPIDEMIA-MIXED 06/09/2008  . Hypertension   . HYPERTENSION, UNSPECIFIED 06/09/2008  . Orthostatic hypotension   . PVD (peripheral vascular disease) ([place])    a. 02/2014 s/p DBA/stent to prox R popliteal; b.  08/2017 Vasc u/s/TBI: Patent mid R pop stent. LSFA 30-40%. TBI: R 0.79, L 0.51 - no significant change, f/u 1 yr.  . Type I diabetes mellitus ([place]) dx'd 08/20/1966  . UNSPECIFIED PERIPHERAL VASCULAR DISEASE 10/06/2008  . Viral gastroenteritis    04/19/2010 improved  . Weakness     Past Surgical History:  Procedure Laterality Date  . [place]; ~ 2010  . CAROTID ENDARTERECTOMY Right   . CATARACT EXTRACTION, BILATERAL Bilateral   . CORONARY ANGIOPLASTY    . CORONARY ARTERY BYPASS GRAFT  1996   "CABG X4"  . LEFT HEART CATH AND CORS/GRAFTS ANGIOGRAPHY N/A 01/11/2017   Procedure: LEFT HEART CATH AND CORS/GRAFTS ANGIOGRAPHY;  Surgeon: Derek Artist, MD;  Location: [facility];  Service: Cardiovascular;  Laterality: N/A;  . MOHS SURGERY    . PERIPHERAL VASCULAR CATHETERIZATION N/A 12/15/2014   Procedure: Abdominal Aortogram;  Surgeon: Derek Hampshire, MD;  Location: [facility];  Service: Cardiovascular;  Laterality: N/A;  . REFRACTIVE SURGERY Right    "before cataract OR"     Current Outpatient Medications  Medication Sig Dispense Refill  .  aspirin 81 MG tablet Take 162 mg by mouth at bedtime.     Marland Kitchen atorvastatin (LIPITOR) 80 MG tablet Take 1 tablet (80 mg total) by mouth daily.    . insulin aspart (NOVOLOG) 100 unit/mL injection Inject into the skin continuous. Insulin Pump    . losartan (COZAAR) 50 MG tablet Take 50 mg by mouth daily.     . nitroGLYCERIN (NITROSTAT) 0.4 MG SL tablet Place 0.4 mg under the tongue every 5 (five) minutes as needed for chest pain.     Marland Kitchen omeprazole (PRILOSEC) 20 MG  capsule Take 20 mg by mouth every morning.     . sertraline (ZOLOFT) 50 MG tablet Take 50 mg by mouth daily.    . traZODone (DESYREL) 50 MG tablet Take 25 mg by mouth at bedtime.     No current facility-administered medications for this visit.    Allergies:   Patient has no known allergies.    Social History:  The patient  reports that he quit smoking about 25 years ago. His smoking use included cigarettes. He has a 87.50 pack-year smoking history. He has never used smokeless tobacco. He reports current alcohol use. He reports that he does not use drugs.   Family History:  The patient's family history includes Coronary artery disease in his father and mother; Diabetes in his father and mother; Stroke in an other family member.    ROS:  Please see the history of present illness.   Otherwise, review of systems are positive for none.   All other systems are reviewed and negative.    PHYSICAL EXAM: VS:  BP (!) 136/58 (BP Location: Left Arm, Patient Position: Sitting, Cuff Size: Normal)   Pulse 70   Ht 6' (1.829 m)   Wt 243 lb (110.2 kg)   SpO2 98%   BMI 32.96 kg/m  , BMI Body mass index is 32.96 kg/m. GEN: Well nourished, well developed, in no acute distress  HEENT: normal  Neck: no JVD, or masses. Right carotid bruit Cardiac: RRR; no murmurs, rubs, or gallops,no edema  Respiratory:  clear to auscultation bilaterally, normal work of breathing GI: soft, nontender, nondistended, + BS MS: no deformity or atrophy  Skin: warm and dry, no rash Neuro:  Strength and sensation are intact Psych: euthymic mood, full affect   EKG:  EKG is ordered today. EKG showed normal sinus rhythm with old inferior infarct and poor R wave progression in the anterior leads.  Recent Labs: No results found for requested labs within last 8760 hours.    Lipid Panel    Component Value Date/Time   CHOL 116 07/08/2014 0830   TRIG 51 07/08/2014 0830   HDL 43 07/08/2014 0830   CHOLHDL 2.7 07/08/2014 0830    CHOLHDL 2.7 07/19/2010 0839   VLDL 10 07/19/2010 0839   LDLCALC 63 07/08/2014 0830      Wt Readings from Last 3 Encounters:  03/19/19 243 lb (110.2 kg)  02/15/18 250 lb (113.4 kg)  01/17/18 251 lb (113.9 kg)        ASSESSMENT AND PLAN:  1.  Peripheral arterial disease: Status post angioplasty and stent placement to the right popliteal artery.  No calf claudication.  Vascular studies in January showed patent stent with no significant obstructive disease.  Repeats studies in 1 year.  2. Coronary artery disease involving native coronary arteries without angina: He continues to be asymptomatic. Continue medical therapy.  3. Hyperlipidemia: Continue treatment with high dose atorvastatin with a target LDL of  less than 70.   4. Carotid disease status post right carotid endarterectomy: Carotid Doppler in January showed no obstructive disease.   Disposition:   FU with me in 12 months  Signed,  Kathlyn Sacramento, MD  03/19/2019 10:51 AM    [place]

## 2019-03-19 NOTE — Patient Instructions (Signed)
Medication Instructions:  Your physician recommends that you continue on your current medications as directed. Please refer to the Current Medication list given to you today.  *If you need a refill on your cardiac medications before your next appointment, please call your pharmacy*  Lab Work: None ordered If you have labs (blood work) drawn today and your tests are completely normal, you will receive your results only by: Marland Kitchen MyChart Message (if you have MyChart) OR . A paper copy in the mail If you have any lab test that is abnormal or we need to change your treatment, we will call you to review the results.  Testing/Procedures: None ordered  Follow-Up: At [facility], you and your health needs are our priority.  As part of our continuing mission to provide you with exceptional heart care, we have created designated Provider Care Teams.  These Care Teams include your primary Cardiologist (physician) and Advanced Practice Providers (APPs -  Physician Assistants and Nurse Practitioners) who all work together to provide you with the care you need, when you need it.  Your next appointment:   12 month(s)  The format for your next appointment:   In Person  Provider:    You may see Kathlyn Sacramento, MD or one of the following Advanced Practice Providers on your designated Care Team:    Murray Hodgkins, NP  Christell Faith, PA-C  Marrianne Mood, PA-C   Other Instructions N/A

## 2019-04-16 DIAGNOSIS — M5416 Radiculopathy, lumbar region: Secondary | ICD-10-CM | POA: Diagnosis not present

## 2019-04-20 DIAGNOSIS — L570 Actinic keratosis: Secondary | ICD-10-CM | POA: Diagnosis not present

## 2019-04-20 DIAGNOSIS — X32XXXA Exposure to sunlight, initial encounter: Secondary | ICD-10-CM | POA: Diagnosis not present

## 2019-04-20 DIAGNOSIS — Z85828 Personal history of other malignant neoplasm of skin: Secondary | ICD-10-CM | POA: Diagnosis not present

## 2019-04-20 DIAGNOSIS — D225 Melanocytic nevi of trunk: Secondary | ICD-10-CM | POA: Diagnosis not present

## 2019-04-20 DIAGNOSIS — L821 Other seborrheic keratosis: Secondary | ICD-10-CM | POA: Diagnosis not present

## 2019-04-20 DIAGNOSIS — D2262 Melanocytic nevi of left upper limb, including shoulder: Secondary | ICD-10-CM | POA: Diagnosis not present

## 2019-04-20 DIAGNOSIS — D2261 Melanocytic nevi of right upper limb, including shoulder: Secondary | ICD-10-CM | POA: Diagnosis not present

## 2019-04-21 DIAGNOSIS — M48061 Spinal stenosis, lumbar region without neurogenic claudication: Secondary | ICD-10-CM | POA: Diagnosis not present

## 2019-04-21 DIAGNOSIS — M5126 Other intervertebral disc displacement, lumbar region: Secondary | ICD-10-CM | POA: Diagnosis not present

## 2019-04-21 DIAGNOSIS — M5416 Radiculopathy, lumbar region: Secondary | ICD-10-CM | POA: Diagnosis not present

## 2019-04-23 DIAGNOSIS — M5416 Radiculopathy, lumbar region: Secondary | ICD-10-CM | POA: Diagnosis not present

## 2019-04-28 DIAGNOSIS — M545 Low back pain: Secondary | ICD-10-CM | POA: Diagnosis not present

## 2019-04-29 DIAGNOSIS — M5416 Radiculopathy, lumbar region: Secondary | ICD-10-CM | POA: Diagnosis not present

## 2019-05-14 DIAGNOSIS — M5416 Radiculopathy, lumbar region: Secondary | ICD-10-CM | POA: Diagnosis not present

## 2019-06-03 ENCOUNTER — Other Ambulatory Visit: Payer: Self-pay

## 2019-06-03 ENCOUNTER — Ambulatory Visit (HOSPITAL_COMMUNITY)
Admission: RE | Admit: 2019-06-03 | Discharge: 2019-06-03 | Disposition: A | Discharge status: Home / Self Care | Payer: Medicare Other | Source: Ambulatory Visit | Attending: Internal Medicine | Admitting: Internal Medicine

## 2019-06-03 ENCOUNTER — Encounter (HOSPITAL_COMMUNITY): Payer: Self-pay | Admitting: Internal Medicine

## 2019-06-03 VITALS — BP 126/62 | HR 71 | Wt 247.4 lb

## 2019-06-03 DIAGNOSIS — I739 Peripheral vascular disease, unspecified: Secondary | ICD-10-CM | POA: Diagnosis not present

## 2019-06-03 DIAGNOSIS — Z79899 Other long term (current) drug therapy: Secondary | ICD-10-CM | POA: Insufficient documentation

## 2019-06-03 DIAGNOSIS — E785 Hyperlipidemia, unspecified: Secondary | ICD-10-CM | POA: Diagnosis not present

## 2019-06-03 DIAGNOSIS — R0602 Shortness of breath: Secondary | ICD-10-CM | POA: Insufficient documentation

## 2019-06-03 DIAGNOSIS — K219 Gastro-esophageal reflux disease without esophagitis: Secondary | ICD-10-CM | POA: Insufficient documentation

## 2019-06-03 DIAGNOSIS — I252 Old myocardial infarction: Secondary | ICD-10-CM | POA: Diagnosis not present

## 2019-06-03 DIAGNOSIS — Z7982 Long term (current) use of aspirin: Secondary | ICD-10-CM | POA: Insufficient documentation

## 2019-06-03 DIAGNOSIS — Z794 Long term (current) use of insulin: Secondary | ICD-10-CM | POA: Diagnosis not present

## 2019-06-03 DIAGNOSIS — I2510 Atherosclerotic heart disease of native coronary artery without angina pectoris: Secondary | ICD-10-CM | POA: Diagnosis not present

## 2019-06-03 DIAGNOSIS — E109 Type 1 diabetes mellitus without complications: Secondary | ICD-10-CM | POA: Insufficient documentation

## 2019-06-03 DIAGNOSIS — Z951 Presence of aortocoronary bypass graft: Secondary | ICD-10-CM | POA: Insufficient documentation

## 2019-06-03 DIAGNOSIS — I10 Essential (primary) hypertension: Secondary | ICD-10-CM | POA: Insufficient documentation

## 2019-06-03 DIAGNOSIS — M549 Dorsalgia, unspecified: Secondary | ICD-10-CM | POA: Diagnosis not present

## 2019-06-03 DIAGNOSIS — Z9641 Presence of insulin pump (external) (internal): Secondary | ICD-10-CM | POA: Diagnosis not present

## 2019-06-03 MED ORDER — RIVAROXABAN 2.5 MG PO TABS: 2.5000 mg | ORAL_TABLET | Freq: Two times a day (BID) | ORAL | 6 refills | Status: DC

## 2019-06-03 NOTE — Progress Notes (Signed)
Medication Samples have been provided to the patient.  Drug name: Xarelto       Strength: 2.5 mg        Qty: 6  LOT: [id number], [id number], 18MG [id number], [id number]  Exp.Date: 9/21, 8/21, 8/21, 5/22  Dosing instructions: take 1 tab Twice daily   The patient has been instructed regarding the correct time, dose, and frequency of taking this medication, including desired effects and most common side effects.   Derek Hogan 2:46 PM 06/03/2019

## 2019-06-03 NOTE — Patient Instructions (Signed)
Start Xarelto 2.5 mg Twice daily   Your physician has requested that you have a lexiscan myoview. For further information please visit [email or web address]. Please follow instruction sheet, as given.  Please call us in 1 year to schedule your follow up appointment  How to Prepare for Your Myoview Test (stress test):  1. Please do not take these medications before your test: INSULIN (please note if this is an exercise test pt should hold beta blocker prior) 2. Your remaining medications may be taken with water. 3. Nothing to eat or drink, except water, 4 hours prior to arrival time.  NO caffeine/decaffeinated products, or chocolate 12 hours prior to arrival. 4. Ladies, please do not wear dresses.  Skirts or pants are approprate, please wear a short sleeve shirt. 5. NO perfume, cologne or lotion 6. Wear comfortable walking shoes.  NO HEELS! 7. Total time is 3 to 4 hours; you may want to bring reading material for the waiting time. 8. Please report to [facility] for your test  What to expect after you arrive:  Once you arrive and check in for your appointment an IV will be started in your arm.  Then the Technoligist will inject a small amount of radioactive tracer.  There will be a 1 hour waiting period after this injection.  A series of pictures will be taken of your heart following this waiting period.  You will be prepped for the stress portion of the test.  During the stress portion of your test you will either walk on a treadmill or receive a small, safe amount of radioactive tracer injected in your IV.  After the stress portion, there is a short rest period during which time your heart and blood pressure will be monitored.  After the short rest period the Technologist will begin your second set of pictures.  Your doctor will inform you of your test results within 7-10 business days.  In preparation for your appointment, medication and supplies will be purchased.  Appointment availability  is limited, so if you need to cancel or reschedule please call the office at [phone] 24 hours in advance to avoid a cancellation fee of $100.00  IF [place], [place] TECHNOLOGIST.

## 2019-06-03 NOTE — Progress Notes (Signed)
Patient ID: Derek Hogan, male   DOB: [birth year], 74 y.o.   MRN: [id number]    Advanced Heart Failure Clinic Note  Primary HF: Dr. Haroldine Laws   HPI: [name] is a delightful 74 year old male with a history of DM1on insulin pump, carotis stenosis s/p R CEA, CAD S/P previous inferior wall myocardial infarction followed by CABG 1996, PAD  Cardiac catheterization in Nov 2010 for Myoview with mild inferior ischemia. This showed three-vessel coronary artery disease with patent grafts. There was significant lesion in distal RCA after the graft insertion which compromised flow to distal PL. There was also some mild flow to this region antegrade via native vessel. I reviewed with Dr. Burt Knack and while ths area may be a source of mild ischemia we felt that trying to angioplasty the distal native RCA might compromise the flow in the SVG-PDA via competitive flow. Thus we elected to continue medical management.     Last cath 12/18 was stable with grafts patent,   Carotids 05/27/13: 0-39% bilaterally Echo 12/14: EF 60-65% moderate LAE CPX in 12/14 showed RER 1.21, peak VO2 22.5, VE/VCO2 slope 25.9. Myoview 10/15 EF 47% Fixed defect inferiorly  [facility]cho at [facility] EF > 55%   He presents for routine f/u. Doing ok. Not exercising as much because gyms closed with Covid. Went back to gym and then was having back pain. Gets SOB walking th dog. Worse from previous. No CP. Walking the dog a bit. Doing yard work. No edema, orthopnea or PND.   ROS: All systems negative except as listed in HPI, PMH and Problem List.  Labs (9/14): K 4.4, creatinine 0.86, LDL 75, HDL 49 Labs (2/15): K 4.8, creatinine 0.9, HDL 39, LDL 76 Labs (8/15): TC 127 TG 58 LDL 73 HDL 47 Labs (3/16): creatinine1.04 Labs (2/17):  Past Medical History:  Diagnosis Date  . CAD, ARTERY BYPASS GRAFT    a. 1996 s/p CABG x 4 (LIMA->LAD, VG->D1, VG->OM3, VG->RPDA; b. 12/2016 [facility]): EF 47%, anteroapical and inf infarcts w/ anteroapical peri-infarct  ischemia; c. 01/2017: Cath: LM 31m/d, LAD 100p/m, D1 90ost, 70p, LCX 80ost/p, 124m, RCA 76m, 75d, RPDA 95ost, VG->RPDA 30p/m, VG->OM3 nl, LIMA->LAD nl, VG->D1 nl.  . Carotid arterial disease ([place])    a. s/p remote R CEA (Dr. Drucie Opitz); b. 01/2018 Carotid U/S: RICA 123456, RCCA Q000111Q, LICA 123456. F/u 12 mos.  . Dyslipidemia   . ERECTILE DYSFUNCTION 06/09/2008  . Erectile dysfunction   . GERD (gastroesophageal reflux disease)   . Heart attack ([place]) 1996  . HYPERLIPIDEMIA-MIXED 06/09/2008  . Hypertension   . HYPERTENSION, UNSPECIFIED 06/09/2008  . Orthostatic hypotension   . PVD (peripheral vascular disease) ([place])    a. 02/2014 s/p DBA/stent to prox R popliteal; b.  08/2017 Vasc u/s/TBI: Patent mid R pop stent. LSFA 30-40%. TBI: R 0.79, L 0.51 - no significant change, f/u 1 yr.  . Type I diabetes mellitus ([place]) dx'd 08/20/1966  . UNSPECIFIED PERIPHERAL VASCULAR DISEASE 10/06/2008  . Viral gastroenteritis    04/19/2010 improved  . Weakness     Current Outpatient Medications  Medication Sig Dispense Refill  . aspirin 81 MG tablet Take 162 mg by mouth at bedtime.     Marland Kitchen atorvastatin (LIPITOR) 80 MG tablet Take 1 tablet (80 mg total) by mouth daily.    . carvedilol (COREG) 3.125 MG tablet Take 3.125 mg by mouth 2 (two) times daily with a meal.    . insulin aspart (NOVOLOG) 100 unit/mL injection Inject into the skin continuous.  Insulin Pump    . losartan (COZAAR) 100 MG tablet Take 100 mg by mouth daily.    Marland Kitchen omeprazole (PRILOSEC) 20 MG capsule Take 20 mg by mouth every morning.     . sertraline (ZOLOFT) 50 MG tablet Take 50 mg by mouth daily.    . traZODone (DESYREL) 50 MG tablet Take 25 mg by mouth at bedtime.    . nitroGLYCERIN (NITROSTAT) 0.4 MG SL tablet Place 0.4 mg under the tongue every 5 (five) minutes as needed for chest pain.      No current facility-administered medications for this encounter.    PHYSICAL EXAM: Vitals:   06/03/19 1346  BP: 126/62  Pulse: 71  SpO2: 95%  Weight: 112.2  kg (247 lb 6.4 oz)   Wt Readings from Last 3 Encounters:  06/03/19 112.2 kg (247 lb 6.4 oz)  03/19/19 110.2 kg (243 lb)  02/15/18 113.4 kg (250 lb)     General:  Well appearing. No resp difficulty HEENT: normal Neck: supple. no JVD. R CEA scar Carotids 2+ bilat; soft right bruits. No lymphadenopathy or thryomegaly appreciated. Cor: PMI nondisplaced. Regular rate & rhythm. No rubs, gallops or murmurs. Lungs: clear Abdomen: soft, nontender, nondistended. No hepatosplenomegaly. No bruits or masses. Good bowel sounds. Extremities: no cyanosis, clubbing, rash, edema Neuro: alert & orientedx3, cranial nerves grossly intact. moves all 4 extremities w/o difficulty. Affect pleasant   ASSESSMENT & PLAN:  1) CAD, s/p CABG 1996 - Last cath 2018. Grafts patent - More SOB with activity, Unable to do ETT due to back pain. Will do Lexiscan Mypvoew - given CAD/PAD/carotid disease will start Xarelto 2.5 bid per the Compass trial 2) HTN:  - Well controlled. Continue current regimen.  3) HLD:  - [facility]. Goal LDL < 70 - continue atorva 80 daily 4) PAD & Carotid stenosis: s/p CEA.  Mild disease on [place] 12/2014. - follows with Dr. Georgina Peer Marlyce Mcdougald,MD 1:58 PM

## 2019-06-03 NOTE — Addendum Note (Signed)
Encounter addended by: Scarlette Calico, RN on: 06/03/2019 2:48 PM  Actions taken: Clinical Note Signed

## 2019-06-04 ENCOUNTER — Telehealth (HOSPITAL_COMMUNITY): Payer: Self-pay

## 2019-06-04 NOTE — Telephone Encounter (Signed)
Spoke to the patient, detailed instructions given. He stated that he understood and would be here for test. Asked to call back with any questions. S.Kaleeah Gingerich EMTP

## 2019-06-09 ENCOUNTER — Ambulatory Visit (HOSPITAL_COMMUNITY): Payer: Medicare Other | Attending: Cardiology

## 2019-06-09 ENCOUNTER — Other Ambulatory Visit: Payer: Self-pay

## 2019-06-09 DIAGNOSIS — I2510 Atherosclerotic heart disease of native coronary artery without angina pectoris: Secondary | ICD-10-CM | POA: Insufficient documentation

## 2019-06-09 LAB — MYOCARDIAL PERFUSION IMAGING
LV dias vol: 113 mL (ref 62–150)
LV sys vol: 55 mL
Peak HR: 78 {beats}/min
Rest HR: 66 {beats}/min
SDS: 8
SRS: 6
SSS: 15
TID: 1.07

## 2019-06-09 MED ORDER — TECHNETIUM TC 99M TETROFOSMIN IV KIT
31.3000 | PACK | Freq: Once | INTRAVENOUS | Status: AC | PRN
  Administered 2019-06-09: 31.3 via INTRAVENOUS
  Filled 2019-06-09: qty 32

## 2019-06-09 MED ORDER — TECHNETIUM TC 99M TETROFOSMIN IV KIT
9.9000 | PACK | Freq: Once | INTRAVENOUS | Status: AC | PRN
  Administered 2019-06-09: 9.9 via INTRAVENOUS
  Filled 2019-06-09: qty 10

## 2019-06-09 MED ORDER — REGADENOSON 0.4 MG/5ML IV SOLN
0.4000 mg | Freq: Once | INTRAVENOUS | Status: AC
  Administered 2019-06-09: 0.4 mg via INTRAVENOUS

## 2019-06-18 ENCOUNTER — Telehealth (HOSPITAL_COMMUNITY): Payer: Self-pay | Admitting: *Deleted

## 2019-06-18 NOTE — Telephone Encounter (Signed)
Received fax from [facility], pt needs clearance to hold Xarelto x3 days and ASA x6 days for a lumbar epidural steroid injection  Per Dr Haroldine Laws ok to do this, note faxed back to them at [phone] atten: [place]

## 2019-07-01 DIAGNOSIS — M5416 Radiculopathy, lumbar region: Secondary | ICD-10-CM | POA: Diagnosis not present

## 2019-09-16 ENCOUNTER — Encounter: Payer: Self-pay | Admitting: Family

## 2019-09-16 ENCOUNTER — Ambulatory Visit (INDEPENDENT_AMBULATORY_CARE_PROVIDER_SITE_OTHER): Payer: Medicare Other | Admitting: Family

## 2019-09-16 ENCOUNTER — Other Ambulatory Visit: Payer: Self-pay

## 2019-09-16 VITALS — BP 146/64 | HR 78 | Ht 72.0 in | Wt 251.2 lb

## 2019-09-16 DIAGNOSIS — I872 Venous insufficiency (chronic) (peripheral): Secondary | ICD-10-CM

## 2019-09-16 DIAGNOSIS — I739 Peripheral vascular disease, unspecified: Secondary | ICD-10-CM | POA: Diagnosis not present

## 2019-09-16 DIAGNOSIS — R600 Localized edema: Secondary | ICD-10-CM | POA: Diagnosis not present

## 2019-09-16 DIAGNOSIS — I2510 Atherosclerotic heart disease of native coronary artery without angina pectoris: Secondary | ICD-10-CM | POA: Diagnosis not present

## 2019-09-16 DIAGNOSIS — E785 Hyperlipidemia, unspecified: Secondary | ICD-10-CM | POA: Diagnosis not present

## 2019-09-16 DIAGNOSIS — I10 Essential (primary) hypertension: Secondary | ICD-10-CM

## 2019-09-16 DIAGNOSIS — I6523 Occlusion and stenosis of bilateral carotid arteries: Secondary | ICD-10-CM

## 2019-09-16 MED ORDER — FUROSEMIDE 20 MG PO TABS
20.0000 mg | ORAL_TABLET | Freq: Every day | ORAL | 5 refills | Status: DC | PRN
Start: 2019-09-16 — End: 2019-10-13

## 2019-09-16 NOTE — Patient Instructions (Addendum)
Medication Instructions:  Your physician has recommended you make the following change in your medication:   START Lasix 20mg  daily for 3 days  After 3 days, transition to Lasix 20mg  daily as needed for swelling  *If you need a refill on your cardiac medications before your next appointment, please call your pharmacy*   Lab Work: No lab work today.   Testing/Procedures: Your EKG today shows normal sinus rhythm with an occasional PVC (premature ventricular contraction) this is an early beat in the bottom chamber of your heart that is not dangerous but can feel like your heart skips a beat.  Your physician has requested that you have a lower extremity venous dopplers.  Follow-Up: At [facility], you and your health needs are our priority.  As part of our continuing mission to provide you with exceptional heart care, we have created designated Provider Care Teams.  These Care Teams include your primary Cardiologist (physician) and Advanced Practice Providers (APPs -  Physician Assistants and Nurse Practitioners) who all work together to provide you with the care you need, when you need it.  We recommend signing up for the patient portal called "MyChart".  Sign up information is provided on this After Visit Summary.  MyChart is used to connect with patients for Virtual Visits (Telemedicine).  Patients are able to view lab/test results, encounter notes, upcoming appointments, etc.  Non-urgent messages can be sent to your provider as well.   To learn more about what you can do with MyChart, go to [email or web address].    Your next appointment:   In February 2022  The format for your next appointment:   In Person  Provider:    You may see Kathlyn Sacramento, MD or one of the following Advanced Practice Providers on your designated Care Team:    Murray Hodgkins, NP  Christell Faith, PA-C  Marrianne Mood, PA-C  Laurann Montana, NP  Other Instructions Continue low salt diet.   Keep  your legs elevated when sitting.   Chronic Venous Insufficiency Chronic venous insufficiency is a condition where the leg veins cannot effectively pump blood from the legs to the heart. This happens when the vein walls are either stretched, weakened, or damaged, or when the valves inside the vein are damaged. With the right treatment, you should be able to continue with an active life. This condition is also called venous stasis. What are the causes? Common causes of this condition include:  High blood pressure inside the veins (venous hypertension).  Sitting or standing too long, causing increased blood pressure in the leg veins.  A blood clot that blocks blood flow in a vein (deep vein thrombosis, DVT).  Inflammation of a vein (phlebitis) that causes a blood clot to form.  Tumors in the pelvis that cause blood to back up. What increases the risk? The following factors may make you more likely to develop this condition:  Having a family history of this condition.  Obesity.  Pregnancy.  Living without enough regular physical activity or exercise (sedentary lifestyle).  Smoking.  Having a job that requires long periods of standing or sitting in one place.  Being a certain age. Women in their 33s and 80s and men in their 38s are more likely to develop this condition. What are the signs or symptoms? Symptoms of this condition include:  Veins that are enlarged, bulging, or twisted (varicose veins).  Skin breakdown or ulcers.  Reddened skin or dark discoloration of skin on the leg between the knee  and ankle.  Brown, smooth, tight, and painful skin just above the ankle, usually on the inside of the leg (lipodermatosclerosis).  Swelling of the legs. How is this diagnosed? This condition may be diagnosed based on:  Your medical history.  A physical exam.  Tests, such as: ? A procedure that creates an image of a blood vessel and nearby organs and provides information about  blood flow through the blood vessel (duplex ultrasound). ? A procedure that tests blood flow (plethysmography). ? A procedure that looks at the veins using X-ray and dye (venogram). How is this treated? The goals of treatment are to help you return to an active life and to minimize pain or disability. Treatment depends on the severity of your condition, and it may include:  Wearing compression stockings. These can help relieve symptoms and help prevent your condition from getting worse. However, they do not cure the condition.  Sclerotherapy. This procedure involves an injection of a solution that shrinks damaged veins. Follow these instructions at home:      Wear compression stockings as told by your health care provider. These stockings help to prevent blood clots and reduce swelling in your legs.  Take over-the-counter and prescription medicines only as told by your health care provider.  Stay active by exercising, walking, or doing different activities. Ask your health care provider what activities are safe for you and how much exercise you need.  Drink enough fluid to keep your urine pale yellow.  Do not use any products that contain nicotine or tobacco, such as cigarettes, e-cigarettes, and chewing tobacco. If you need help quitting, ask your health care provider.  Keep all follow-up visits as told by your health care provider. This is important. Contact a health care provider if you:  Have redness, swelling, or more pain in the affected area.  See a red streak or line that goes up or down from the affected area.  Have skin breakdown or skin loss in the affected area, even if the breakdown is small.  Get an injury in the affected area. Get help right away if:  You get an injury and an open wound in the affected area.  You have: ? Severe pain that does not get better with medicine. ? Sudden numbness or weakness in the foot or ankle below the affected area. ? Trouble  moving your foot or ankle. ? A fever. ? Worse or persistent symptoms. ? Chest pain. ? Shortness of breath. Summary  Chronic venous insufficiency is a condition where the leg veins cannot effectively pump blood from the legs to the heart.  Chronic venous insufficiency occurs when the vein walls become stretched, weakened, or damaged, or when valves within the vein are damaged.  Treatment depends on how severe your condition is. It often involves wearing compression stockings and may involve having a procedure.  Make sure you stay active by exercising, walking, or doing different activities. Ask your health care provider what activities are safe for you and how much exercise you need. This information is not intended to replace advice given to you by your health care provider. Make sure you discuss any questions you have with your health care provider. Document Revised: 10/15/2017 Document Reviewed: 10/15/2017 Elsevier Patient Education  [place].

## 2019-09-16 NOTE — Progress Notes (Signed)
Office Visit    Patient Name: Derek Hogan Date of Encounter: 09/16/2019  Primary Care Provider:  Kirk Ruths, MD Primary Cardiologist:  Kathlyn Sacramento, MD Electrophysiologist:  None   Chief Complaint    Derek Hogan is a 74 y.o. male with a hx of CAD s/p previous inferior wall MI followed by CABG 1996, PAD s/p drug-coated balloon angioplasty and stent of the proximal right popliteal artery 12/2014, questionable TIA 04/2014, previous tobacco use having quit 1996, DM1 on insulin pump, carotid artery disease s/p right carotid endarterectomy, HLD, HTN presents today for RLE swelling  Past Medical History    Past Medical History:  Diagnosis Date  . CAD, ARTERY BYPASS GRAFT    a. 1996 s/p CABG x 4 (LIMA->LAD, VG->D1, VG->OM3, VG->RPDA; b. 12/2016 [facility]): EF 47%, anteroapical and inf infarcts w/ anteroapical peri-infarct ischemia; c. 01/2017: Cath: LM 48m/d, LAD 100p/m, D1 90ost, 70p, LCX 80ost/p, 169m, RCA 76m, 75d, RPDA 95ost, VG->RPDA 30p/m, VG->OM3 nl, LIMA->LAD nl, VG->D1 nl.  . Carotid arterial disease ([place])    a. s/p remote R CEA (Dr. Drucie Opitz); b. 01/2018 Carotid U/S: RICA 0-27%, RCCA <74%, LICA 1-28%. F/u 12 mos.  . Dyslipidemia   . ERECTILE DYSFUNCTION 06/09/2008  . Erectile dysfunction   . GERD (gastroesophageal reflux disease)   . Heart attack ([place]) 1996  . HYPERLIPIDEMIA-MIXED 06/09/2008  . Hypertension   . HYPERTENSION, UNSPECIFIED 06/09/2008  . Orthostatic hypotension   . PVD (peripheral vascular disease) ([place])    a. 02/2014 s/p DBA/stent to prox R popliteal; b.  08/2017 Vasc u/s/TBI: Patent mid R pop stent. LSFA 30-40%. TBI: R 0.79, L 0.51 - no significant change, f/u 1 yr.  . Type I diabetes mellitus ([place]) dx'd 08/20/1966  . UNSPECIFIED PERIPHERAL VASCULAR DISEASE 10/06/2008  . Viral gastroenteritis    04/19/2010 improved  . Weakness    Past Surgical History:  Procedure Laterality Date  . [place]; ~ 2010  . CAROTID  ENDARTERECTOMY Right   . CATARACT EXTRACTION, BILATERAL Bilateral   . CORONARY ANGIOPLASTY    . CORONARY ARTERY BYPASS GRAFT  1996   "CABG X4"  . LEFT HEART CATH AND CORS/GRAFTS ANGIOGRAPHY N/A 01/11/2017   Procedure: LEFT HEART CATH AND CORS/GRAFTS ANGIOGRAPHY;  Surgeon: Jolaine Artist, MD;  Location: [facility];  Service: Cardiovascular;  Laterality: N/A;  . MOHS SURGERY    . PERIPHERAL VASCULAR CATHETERIZATION N/A 12/15/2014   Procedure: Abdominal Aortogram;  Surgeon: Wellington Hampshire, MD;  Location: [facility];  Service: Cardiovascular;  Laterality: N/A;  . REFRACTIVE SURGERY Right    "before cataract OR"    Allergies  No Known Allergies  History of Present Illness    Derek Hogan is a 74 y.o. male with a hx of CAD s/p previous inferior wall MI followed by CABG 1996, PAD s/p drug-coated balloon angioplasty and stent of the proximal right popliteal artery 12/2014, questionable TIA 04/2014, previous tobacco use having quit 1996, DM1 on insulin pump, carotid artery disease s/p right carotid endarterectomy, HLD, HTN. He was last seen by Dr. Haroldine Laws on 05/26/2019 and by Dr. Fletcher Anon 03/19/2019.  He had Covid January 2021 but did not require hospitalization.  Follows with Dr. Fletcher Anon regarding his vascular disease.  He had duplex performed January 2021.  Noted patent stent in the right popliteal artery.  Carotid duplex January 2021 with right-sided nonhemodynamically significant plaque less than 50% in CCA.  Left carotid with 1 to  39% stenosis of left ICA.  Recommended for repeat study 02/2020.  Echocardiogram February 2021 ordered by [facility] performed at [facility] with EF 55%, mild LVH, grade 1 diastolic dysfunction, trivial AI/MR/TR without stenosis.  Presents today for RLE edema. Reports RLE over the last few months. No LLE edema. Reports no pain, no color change.   He is having some pain in his back and down his posterior thigh. He broke his back several years ago. He has had  epidural shots int he past. Had the last one 6-8 weeks ago. Hurts most significantly when he is walking but was "hurting like crazy" this morning. Goes away when he sits down.  Tells me this feels different than his previous claudication symptoms.  Reports no shortness of breath. Reports his dyspnea on exertion is stable. Reports no chest pain, pressure, or tightness. No orthopnea, PND. Reports no palpitations.   He sits most of the day but does sit with his feet elevated. Does not add salt. Drinks Powerade zero throughout the day (2-3 bottles), 12 oz cup of coffee in the morning.   BP at home 130/62.   EKGs/Labs/Other Studies Reviewed:   The following studies were reviewed today:  Lexiscan Myoview 06/2019  Nuclear stress EF: 52%.  There was no ST segment deviation noted during stress.  Defect 1: There is a defect present in the basal inferior, mid anterior, mid inferior, apical anterior and apex location.  This is a low risk study.  Findings consistent with prior myocardial infarction.  The left ventricular ejection fraction is mildly decreased (45-54%).   Abnormal, low risk stress nuclear study with a small prior distal anterior/apical infarct and basal inferior infarct.  No ischemia.  Gated ejection fraction 52% with hypokinesis of the inferior wall.  Echo 03/2019  NORMAL LEFT VENTRICULAR SYSTOLIC FUNCTION WITH MILD LVH    NORMAL RIGHT VENTRICULAR SYSTOLIC FUNCTION    VALVULAR REGURGITATION: TRIVIAL AR, TRIVIAL MR, TRIVIAL TR    NO VALVULAR STENOSIS    POOR SOUND TRANSMISSION   EKG:  EKG is ordered today.  The ekg ordered today demonstrates sinus rhythm 70 bpm single PVC, stable prior inferior infarct, poor R wave progression of the anterior leads.  Recent Labs: No results found for requested labs within last 8760 hours.  Recent Lipid Panel    Component Value Date/Time   CHOL 116 07/08/2014 0830   TRIG 51 07/08/2014 0830   HDL 43 07/08/2014 0830   CHOLHDL 2.7 07/08/2014  0830   CHOLHDL 2.7 07/19/2010 0839   VLDL 10 07/19/2010 0839   LDLCALC 63 07/08/2014 0830    Home Medications   Current Meds  Medication Sig  . aspirin 81 MG tablet Take 162 mg by mouth at bedtime.   Marland Kitchen atorvastatin (LIPITOR) 80 MG tablet Take 1 tablet (80 mg total) by mouth daily.  . carvedilol (COREG) 3.125 MG tablet Take 3.125 mg by mouth 2 (two) times daily with a meal.  . insulin aspart (NOVOLOG) 100 unit/mL injection Inject into the skin continuous. Insulin Pump  . losartan (COZAAR) 100 MG tablet Take 100 mg by mouth daily.  . nitroGLYCERIN (NITROSTAT) 0.4 MG SL tablet Place 0.4 mg under the tongue every 5 (five) minutes as needed for chest pain.   Marland Kitchen omeprazole (PRILOSEC) 20 MG capsule Take 20 mg by mouth every morning.   . rivaroxaban (XARELTO) 2.5 MG TABS tablet Take 1 tablet (2.5 mg total) by mouth 2 (two) times daily.  . sertraline (ZOLOFT) 50 MG tablet Take 50  mg by mouth daily.  . traZODone (DESYREL) 50 MG tablet Take 25 mg by mouth at bedtime.    Review of Systems    Review of Systems  Constitutional: Negative for chills, fever and malaise/fatigue.  Cardiovascular: Positive for leg swelling. Negative for chest pain, dyspnea on exertion, irregular heartbeat, near-syncope, orthopnea, palpitations and syncope.  Respiratory: Negative for cough, shortness of breath and wheezing.   Musculoskeletal: Positive for back pain.  Gastrointestinal: Negative for melena, nausea and vomiting.  Genitourinary: Negative for hematuria.  Neurological: Negative for dizziness, light-headedness and weakness.   All other systems reviewed and are otherwise negative except as noted above.  Physical Exam    VS:  BP (!) 146/64 (BP Location: Left Arm, Patient Position: Sitting, Cuff Size: Normal)   Pulse 78   Ht 6' (1.829 m)   Wt 251 lb 4 oz (114 kg)   SpO2 98%   BMI 34.08 kg/m  , BMI Body mass index is 34.08 kg/m. GEN: Well nourished, overweight, well developed, in no acute  distress. HEENT: normal. Neck: Supple, no JVD, carotid bruits, or masses. Cardiac: RRR, no murmurs, rubs, or gallops. No clubbing, cyanosis.  RLE with 1+ pitting edema from foot to mid calf..  Radials/DP/PT 2+ and equal bilaterally.  Respiratory:  Respirations regular and unlabored, clear to auscultation bilaterally. GI: Soft, nontender, nondistended. MS: No deformity or atrophy. Skin: Warm and dry, no rash. Neuro:  Strength and sensation are intact. Psych: Normal affect.   Assessment & Plan    1. RLE edema/Venous insufficiency -reports worsening right lower extremity edema over the last few months.  Tells me the swelling used to go down but has not been able to get rid of it completely.  He was on Lasix previously though this has been discontinued as he did not need it every day.  Reports no color change nor pain with edema.  Does try to sit with his leg up in a low-salt diet.  Of note, his weight has increased 8 pounds over the last 6 months.  Reports no worsening shortness of breath nor orthopnea, PND. Low suspicion HF, will defer echocardiogram at this time. Bilateral DP 1+. Anticipate venous insufficiency as etiology.  However, will obtain limited RLE duplex to rule out DVT.  He reports no worsening symptoms of claudication and tells me this feels "nothing like "when he needed his stent in 2016.  Rx for Lasix 20 mg daily for 3 days then 20 mg as needed for swelling.  2. CAD s/p CABG 1996 -most recent cath in 2018 with patent graft.  Lexiscan Myoview 06/2019 low risk study with EF 52% and no evidence of ischemia, findings consistent with prior MI.  He is on Xarelto 2.5 mg twice daily per the Compass trial as prescribed by Dr.Bensimhon.  Stable with no anginal symptoms.  No negation for ischemic evaluation at this time.  3. HTN -blood pressure mildly elevated today though reports it is routinely 130s over 60s at home.  Continue present antihypertensive regimen.  Encouraged to report blood  pressure consistently greater than 130/80.  4. HLD, LDL <70 - [facility] continue at[facility]atin 40 mg daily.  5. PAD and carotid stenosis - s/p right CEA.  S/p right popliteal artery stenting in 2016. Repeat studies in January per previous recommendation. Continue Aspirin 81mg  daily, Xarelto 2.5 mg BID. Denies bleeding complications. No symptoms concerning for worsening stenosis.  Disposition: Follow up In February as previously scheduled with Dr. Fletcher Anon or APP.  [employer]  Thomes Dinning, NP 09/16/2019, 2:55 PM

## 2019-09-17 ENCOUNTER — Ambulatory Visit (INDEPENDENT_AMBULATORY_CARE_PROVIDER_SITE_OTHER): Payer: Medicare Other

## 2019-09-17 DIAGNOSIS — R600 Localized edema: Secondary | ICD-10-CM | POA: Diagnosis not present

## 2019-09-17 DIAGNOSIS — I872 Venous insufficiency (chronic) (peripheral): Secondary | ICD-10-CM | POA: Diagnosis not present

## 2019-09-21 DIAGNOSIS — Z20822 Contact with and (suspected) exposure to covid-19: Secondary | ICD-10-CM | POA: Diagnosis not present

## 2019-10-13 ENCOUNTER — Telehealth: Payer: Self-pay | Admitting: Family

## 2019-10-13 MED ORDER — FUROSEMIDE 20 MG PO TABS: 40.0000 mg | ORAL_TABLET | ORAL | 5 refills | Status: DC

## 2019-10-13 NOTE — Addendum Note (Signed)
Addended by: Verlon Au on: 10/13/2019 11:27 AM   Modules accepted: Orders

## 2019-10-13 NOTE — Telephone Encounter (Signed)
Attempted to call patient. LMTCB 10/13/2019   

## 2019-10-13 NOTE — Telephone Encounter (Signed)
Right leg swelling has not improved since office visit. Denies decrease in weight since addition of Lasix. Is currently taking it daily. Does not find he has been urinating more.   Current weight 250 lbs.  Denies SOB.  BP at BL.   Routing to Laurann Montana, NP to further advise.

## 2019-10-13 NOTE — Telephone Encounter (Signed)
Pt c/o swelling: STAT is pt has developed SOB within 24 hours  1) How much weight have you gained and in what time span? Yes not sure but no increase noticed since visit   2) If swelling, where is the swelling located? RLE  3) Are you currently taking a fluid pill? Yes Has not seen improvement since visit and med change 4) Are you currently SOB? no  5) Do you have a log of your daily weights (if so, list)? no  6) Have you gained 3 pounds in a day or 5 pounds in a week? No   7) Have you traveled recently? no

## 2019-10-13 NOTE — Telephone Encounter (Signed)
Call to patient to review plan of care from provider.    Pt verbalized understanding and has no further questions at this time.    Advised pt to call for any further questions or concerns.  Orders updated.

## 2019-10-13 NOTE — Telephone Encounter (Signed)
He had LE duplex with showed no DVT in the RLE. Etiology of his RLE swelling is likely venous insufficiency. Recommend he keep his legs elevated and wear compression stockings.   Rather than Lasix 20mg  daily, let's change to Lasix 40mg  every other day.   He can call us back next week to let us know if he notices any improvement.   Derek Dubonnet, NP

## 2019-10-26 DIAGNOSIS — Z85828 Personal history of other malignant neoplasm of skin: Secondary | ICD-10-CM | POA: Diagnosis not present

## 2019-10-26 DIAGNOSIS — D2272 Melanocytic nevi of left lower limb, including hip: Secondary | ICD-10-CM | POA: Diagnosis not present

## 2019-10-26 DIAGNOSIS — L821 Other seborrheic keratosis: Secondary | ICD-10-CM | POA: Diagnosis not present

## 2019-10-26 DIAGNOSIS — D2262 Melanocytic nevi of left upper limb, including shoulder: Secondary | ICD-10-CM | POA: Diagnosis not present

## 2019-10-26 DIAGNOSIS — D225 Melanocytic nevi of trunk: Secondary | ICD-10-CM | POA: Diagnosis not present

## 2019-10-26 DIAGNOSIS — D2261 Melanocytic nevi of right upper limb, including shoulder: Secondary | ICD-10-CM | POA: Diagnosis not present

## 2019-11-12 DIAGNOSIS — D649 Anemia, unspecified: Secondary | ICD-10-CM | POA: Diagnosis not present

## 2019-12-05 DIAGNOSIS — Z23 Encounter for immunization: Secondary | ICD-10-CM | POA: Diagnosis not present

## 2020-02-01 DIAGNOSIS — Z20822 Contact with and (suspected) exposure to covid-19: Secondary | ICD-10-CM | POA: Diagnosis not present

## 2020-02-08 DIAGNOSIS — D509 Iron deficiency anemia, unspecified: Secondary | ICD-10-CM | POA: Diagnosis not present

## 2020-03-16 ENCOUNTER — Other Ambulatory Visit: Admission: RE | Admit: 2020-03-16 | Payer: No Typology Code available for payment source | Source: Ambulatory Visit

## 2020-03-29 ENCOUNTER — Other Ambulatory Visit: Payer: Self-pay

## 2020-03-29 ENCOUNTER — Encounter: Payer: Self-pay | Admitting: Cardiovascular Disease

## 2020-03-29 ENCOUNTER — Ambulatory Visit (INDEPENDENT_AMBULATORY_CARE_PROVIDER_SITE_OTHER): Payer: Medicare Other | Admitting: Cardiovascular Disease

## 2020-03-29 VITALS — BP 132/72 | HR 76 | Ht 72.0 in | Wt 258.5 lb

## 2020-03-29 DIAGNOSIS — I6529 Occlusion and stenosis of unspecified carotid artery: Secondary | ICD-10-CM | POA: Diagnosis not present

## 2020-03-29 DIAGNOSIS — I739 Peripheral vascular disease, unspecified: Secondary | ICD-10-CM

## 2020-03-29 DIAGNOSIS — I2510 Atherosclerotic heart disease of native coronary artery without angina pectoris: Secondary | ICD-10-CM | POA: Diagnosis not present

## 2020-03-29 DIAGNOSIS — E785 Hyperlipidemia, unspecified: Secondary | ICD-10-CM | POA: Diagnosis not present

## 2020-03-29 NOTE — Patient Instructions (Signed)
Medication Instructions:  Your physician recommends that you continue on your current medications as directed. Please refer to the Current Medication list given to you today.  *If you need a refill on your cardiac medications before your next appointment, please call your pharmacy*   Lab Work: None ordered If you have labs (blood work) drawn today and your tests are completely normal, you will receive your results only by: Marland Kitchen MyChart Message (if you have MyChart) OR . A paper copy in the mail If you have any lab test that is abnormal or we need to change your treatment, we will call you to review the results.   Testing/Procedures: Your physician has requested that you have a aorta/ivc/iliac duplex.   Your physician has requested that you have an ankle brachial index (ABI). During this test an ultrasound and blood pressure cuff are used to evaluate the arteries that supply the arms and legs with blood. Allow thirty minutes for this exam. There are no restrictions or special instructions.     Follow-Up: At [facility] you and your health needs are our priority.  As part of our continuing mission to provide you with exceptional heart care, we have created designated Provider Care Teams.  These Care Teams include your primary Cardiologist (physician) and Advanced Practice Providers (APPs -  Physician Assistants and Nurse Practitioners) who all work together to provide you with the care you need, when you need it.  We recommend signing up for the patient portal called "MyChart".  Sign up information is provided on this After Visit Summary.  MyChart is used to connect with patients for Virtual Visits (Telemedicine).  Patients are able to view lab/test results, encounter notes, upcoming appointments, etc.  Non-urgent messages can be sent to your provider as well.   To learn more about what you can do with MyChart, go to [email or web address].    Your next appointment:   6 month(s)  The  format for your next appointment:   In Person  Provider:   You may see Kathlyn Sacramento, MD or one of the following Advanced Practice Providers on your designated Care Team:    Murray Hodgkins, NP  Christell Faith, PA-C  Marrianne Mood, PA-C  Cadence Addison, [place]  Laurann Montana, NP    Other Instructions N/A

## 2020-03-29 NOTE — Progress Notes (Signed)
Cardiology Office Note   Date:  03/29/2020   ID:  Baird, Polinski Jun 11, 1945, MRN [id number]  PCP:  Kirk Ruths, MD  Cardiologist:  Dr. Fletcher Anon  Chief Complaint  Patient presents with   6 month follow up     Patient c/o LE edema and shortness of breath. Medications reviewed by the patient verbally.       History of Present Illness: Derek Hogan is a 75 y.o. male who presents for a follow-up visit regarding coronary artery disease and peripheral arterial disease.  He has known history of CAD S/P previous inferior wall myocardial infarction followed by CABG in 1996. He had questionable TIA in March 2016. He quit smoking in 1996 after CABG. He has prolonged history of diabetes currently on insulin pump, Carotid disease status post right carotid endarterectomy, hyperlipidemia and hypertension. He is known to have peripheral arterial disease.  He had drug-coated balloon angioplasty followed by self-expanding stent placement to the proximal right popliteal artery for severe claudication in November 2016.  He had a [place] Myoview last year that showed no evidence of ischemia.  He was seen last summer for worsening right leg edema.  Lower extremity venous duplex showed no evidence of DVT.  He has been doing reasonably well overall.  His biggest complaint seems to be lower back pain radiating to his hips and the back of his thighs.  This happens with walking mostly especially if he is going uphill.  Past Medical History:  Diagnosis Date   CAD, ARTERY BYPASS GRAFT    a. 1996 s/p CABG x 4 (LIMA->LAD, VG->D1, VG->OM3, VG->RPDA; b. 12/2016 [facility]): EF 47%, anteroapical and inf infarcts w/ anteroapical peri-infarct ischemia; c. 01/2017: Cath: LM 22md, LAD 100p/m, D1 90ost, 70p, LCX 80ost/p, 1070mRCA 402m5d, RPDA 95ost, VG->RPDA 30p/m, VG->OM3 nl, LIMA->LAD nl, VG->D1 nl.   [place] arterial disease ([place]  a. s/p remote R CEA (Dr. GreDrucie Opitzb. 01/2018 Carotid U/S:  RICA 1-33-41%CCA <50<93%ICA 1-37-90%/u 12 mos.   Dyslipidemia    ERECTILE DYSFUNCTION 06/09/2008   Erectile dysfunction    GERD (gastroesophageal reflux disease)    Heart [place]   HYPERLIPIDEMIA-MIXED 06/09/2008   Hypertension    HYPERTENSION, UNSPECIFIED 06/09/2008   Orthostatic hypotension    PVD (peripheral vascular disease) ([place]  a. 02/2014 s/p DBA/stent to prox R popliteal; b.  08/2017 Vasc u/s/TBI: Patent mid R pop stent. LSFA 30-40%. TBI: R 0.79, L 0.51 - no significant change, f/u 1 yr.   Type I diabetes mellitus ([place] 08/20/1966   UNSPECIFIED PERIPHERAL VASCULAR DISEASE 10/06/2008   Viral gastroenteritis    04/19/2010 improved   Weakness     Past Surgical History:  Procedure Laterality Date   CARDIAC CATHETERIZATION  1996; ~ 2010   CAROTID ENDARTERECTOMY Right    CATARACT EXTRACTION, BILATERAL Bilateral    CORONARY ANGIOPLASTY     CORONARY ARTERY BYPASS GRAFT  1996   "CABG X4"   LEFT HEART CATH AND CORS/GRAFTS ANGIOGRAPHY N/A 01/11/2017   Procedure: LEFT HEART CATH AND CORS/GRAFTS ANGIOGRAPHY;  Surgeon: BenJolaine ArtistD;  Location: [facility];  Service: Cardiovascular;  Laterality: N/A;   MOHS SURGERY     PERIPHERAL VASCULAR CATHETERIZATION N/A 12/15/2014   Procedure: Abdominal Aortogram;  Surgeon: MuhWellington HampshireD;  Location: [facility];  Service: Cardiovascular;  Laterality: N/A;   REFRACTIVE SURGERY Right    "before cataract OR"  Current Outpatient Medications  Medication Sig Dispense Refill   aspirin 81 MG tablet Take 162 mg by mouth at bedtime.      atorvastatin (LIPITOR) 80 MG tablet Take 1 tablet (80 mg total) by mouth daily.     Calcium Carb-Cholecalciferol 500-100 MG-UNIT CHEW CHEW 1 TABLET BY MOUTH TWO TIMES A DAY FOR BONE HEALTH     carvedilol (COREG) 3.125 MG tablet Take 3.125 mg by mouth 2 (two) times daily with a meal.     Cholecalciferol 25 MCG (1000 UT) tablet Take 1 tablet by mouth  daily.     furosemide (LASIX) 20 MG tablet Take 40 mg by mouth as needed.     glucagon 1 MG injection INJECT 1MG KIT INTRAMUSCULARLY  AS NEEDED FOR EMERGENCY HYPOGLYCEMIA     hydrOXYzine (ATARAX/VISTARIL) 25 MG tablet Take 1 tablet by mouth daily as needed.     insulin aspart (NOVOLOG) 100 unit/mL injection Inject into the skin continuous. Insulin Pump     losartan (COZAAR) 100 MG tablet Take 100 mg by mouth daily.     nitroGLYCERIN (NITROSTAT) 0.4 MG SL tablet Place 0.4 mg under the tongue every 5 (five) minutes as needed for chest pain.      omeprazole (PRILOSEC) 20 MG capsule Take 20 mg by mouth every morning.      rivaroxaban (XARELTO) 2.5 MG TABS tablet Take 1 tablet (2.5 mg total) by mouth 2 (two) times daily. 60 tablet 6   sertraline (ZOLOFT) 100 MG tablet Take 100 mg by mouth daily.     traZODone (DESYREL) 50 MG tablet Take 25 mg by mouth at bedtime.     No current facility-administered medications for this visit.    Allergies:   Lisinopril and Simvastatin    Social History:  The patient  reports that he quit smoking about 26 years ago. His smoking use included cigarettes. He has a 87.50 pack-year smoking history. He has never used smokeless tobacco. He reports current alcohol use. He reports that he does not use drugs.   Family History:  The patient's family history includes Coronary artery disease in his father and mother; Diabetes in his father and mother; Stroke in an other family member.    ROS:  Please see the history of present illness.   Otherwise, review of systems are positive for none.   All other systems are reviewed and negative.    PHYSICAL EXAM: VS:  BP 132/72 (BP Location: Left Arm, Patient Position: Sitting, Cuff Size: Normal)    Pulse 76    Ht 6' (1.829 m)    Wt 258 lb 8 oz (117.3 kg)    SpO2 97%    BMI 35.06 kg/m  , BMI Body mass index is 35.06 kg/m. GEN: Well nourished, well developed, in no acute distress  HEENT: normal  Neck: no JVD, or masses.  Right carotid bruit Cardiac: RRR; no murmurs, rubs, or gallops,no edema  Respiratory:  clear to auscultation bilaterally, normal work of breathing GI: soft, nontender, nondistended, + BS MS: no deformity or atrophy  Skin: warm and dry, no rash Neuro:  Strength and sensation are intact Psych: euthymic mood, full affect Vascular: Femoral pulse is difficult to palpate bilaterally.  EKG:  EKG is ordered today. EKG showed normal sinus rhythm with nonspecific T wave changes.  Recent Labs: No results found for requested labs within last 8760 hours.    Lipid Panel    Component Value Date/Time   CHOL 116 07/08/2014 0830   TRIG 51  07/08/2014 0830   HDL 43 07/08/2014 0830   CHOLHDL 2.7 07/08/2014 0830   CHOLHDL 2.7 07/19/2010 0839   VLDL 10 07/19/2010 0839   LDLCALC 63 07/08/2014 0830      Wt Readings from Last 3 Encounters:  03/29/20 258 lb 8 oz (117.3 kg)  09/16/19 251 lb 4 oz (114 kg)  06/09/19 247 lb (112 kg)        ASSESSMENT AND PLAN:  1.  Peripheral arterial disease: Status post angioplasty and stent placement to the right popliteal artery.  No calf claudication.  He complains of exertional hip and thigh pain.  Previous angiogram in 2016 showed no significant aortoiliac disease.  However, by today's exam, his femoral pulses are very difficult to palpate.  Going to obtain an ABI and aortoiliac duplex for evaluation. Other possible etiology for his symptoms could be spinal stenosis.  He is going to have evaluation for that in the near future. I agree with small dose Xarelto given PAD and CAD.  2. Coronary artery disease involving native coronary arteries without angina: He continues to be asymptomatic. Continue medical therapy.  Stress test last year was normal.  3. Hyperlipidemia: Continue treatment with high dose atorvastatin with a target LDL of less than 70.   4. Carotid disease status post right carotid endarterectomy: Carotid Doppler in January of last year showed no  obstructive disease.  5.  Right lower extremity swelling: Likely due to chronic venous insufficiency.   Disposition:   FU with me in 6 months  Signed,  Kathlyn Sacramento, MD  03/29/2020 4:42 PM    [place]

## 2020-04-01 DIAGNOSIS — M545 Low back pain, unspecified: Secondary | ICD-10-CM | POA: Diagnosis not present

## 2020-04-05 DIAGNOSIS — M5137 Other intervertebral disc degeneration, lumbosacral region: Secondary | ICD-10-CM | POA: Diagnosis not present

## 2020-04-05 DIAGNOSIS — M4807 Spinal stenosis, lumbosacral region: Secondary | ICD-10-CM | POA: Diagnosis not present

## 2020-04-05 DIAGNOSIS — M48061 Spinal stenosis, lumbar region without neurogenic claudication: Secondary | ICD-10-CM | POA: Diagnosis not present

## 2020-04-05 DIAGNOSIS — M4856XA Collapsed vertebra, not elsewhere classified, lumbar region, initial encounter for fracture: Secondary | ICD-10-CM | POA: Diagnosis not present

## 2020-04-05 DIAGNOSIS — M47817 Spondylosis without myelopathy or radiculopathy, lumbosacral region: Secondary | ICD-10-CM | POA: Diagnosis not present

## 2020-04-05 DIAGNOSIS — M545 Low back pain, unspecified: Secondary | ICD-10-CM | POA: Diagnosis not present

## 2020-04-18 ENCOUNTER — Ambulatory Visit: Payer: Medicare Other

## 2020-04-18 ENCOUNTER — Other Ambulatory Visit: Payer: Self-pay

## 2020-04-22 ENCOUNTER — Encounter: Admission: RE | Payer: Self-pay | Source: Ambulatory Visit

## 2020-04-22 ENCOUNTER — Ambulatory Visit: Admission: RE | Admit: 2020-04-22 | Payer: Medicare Other | Source: Ambulatory Visit

## 2020-04-22 SURGERY — EGD (ESOPHAGOGASTRODUODENOSCOPY)
Anesthesia: General

## 2020-05-02 DIAGNOSIS — E109 Type 1 diabetes mellitus without complications: Secondary | ICD-10-CM | POA: Diagnosis not present

## 2020-05-02 DIAGNOSIS — Z0000 Encounter for general adult medical examination without abnormal findings: Secondary | ICD-10-CM | POA: Diagnosis not present

## 2020-05-02 DIAGNOSIS — I25118 Atherosclerotic heart disease of native coronary artery with other forms of angina pectoris: Secondary | ICD-10-CM | POA: Diagnosis not present

## 2020-05-02 DIAGNOSIS — I10 Essential (primary) hypertension: Secondary | ICD-10-CM | POA: Diagnosis not present

## 2020-05-02 DIAGNOSIS — I700 Atherosclerosis of aorta: Secondary | ICD-10-CM | POA: Diagnosis not present

## 2020-05-04 DIAGNOSIS — M545 Low back pain, unspecified: Secondary | ICD-10-CM | POA: Diagnosis not present

## 2020-05-12 DIAGNOSIS — M47816 Spondylosis without myelopathy or radiculopathy, lumbar region: Secondary | ICD-10-CM | POA: Diagnosis not present

## 2020-05-12 DIAGNOSIS — M545 Low back pain, unspecified: Secondary | ICD-10-CM | POA: Diagnosis not present

## 2020-05-13 ENCOUNTER — Other Ambulatory Visit: Payer: Self-pay | Admitting: Cardiovascular Disease

## 2020-05-13 DIAGNOSIS — I739 Peripheral vascular disease, unspecified: Secondary | ICD-10-CM

## 2020-05-17 ENCOUNTER — Ambulatory Visit (INDEPENDENT_AMBULATORY_CARE_PROVIDER_SITE_OTHER): Payer: Medicare Other

## 2020-05-17 ENCOUNTER — Other Ambulatory Visit: Payer: Self-pay

## 2020-05-17 DIAGNOSIS — Z95828 Presence of other vascular implants and grafts: Secondary | ICD-10-CM | POA: Diagnosis not present

## 2020-05-17 DIAGNOSIS — I739 Peripheral vascular disease, unspecified: Secondary | ICD-10-CM | POA: Diagnosis not present

## 2020-05-18 ENCOUNTER — Telehealth: Payer: Self-pay | Admitting: Cardiovascular Disease

## 2020-05-18 NOTE — Telephone Encounter (Addendum)
Spoke with the patient. Adv him that his results are pending Dr. Tyrell Antonio review. Adv the patient that Dr. Fletcher Anon is out of the office and will return on 05/23/20. Adv the patient that once available I will contact with the results and Dr. Tyrell Antonio recommendation. Patient verbalized understanding.

## 2020-05-18 NOTE — Telephone Encounter (Signed)
Patient calling for recent ultrasound results

## 2020-05-23 NOTE — Telephone Encounter (Signed)
See result note.  

## 2020-05-23 NOTE — Telephone Encounter (Signed)
Patient calling for results.

## 2020-05-24 ENCOUNTER — Telehealth: Payer: Self-pay

## 2020-05-24 NOTE — Telephone Encounter (Signed)
Patient made aware of results with verbalized understanding. 

## 2020-05-24 NOTE — Telephone Encounter (Signed)
-----   Message from Wellington Hampshire, MD sent at 05/23/2020  5:02 PM EDT ----- Stable ABI with patent right popliteal stent and no evidence of aortic aneurysm or iliac disease.  Continue medical therapy.

## 2020-05-26 DIAGNOSIS — M47816 Spondylosis without myelopathy or radiculopathy, lumbar region: Secondary | ICD-10-CM | POA: Diagnosis not present

## 2020-06-02 DIAGNOSIS — M47816 Spondylosis without myelopathy or radiculopathy, lumbar region: Secondary | ICD-10-CM | POA: Diagnosis not present

## 2020-06-07 ENCOUNTER — Encounter: Payer: Self-pay | Admitting: *Deleted

## 2020-06-07 ENCOUNTER — Encounter
Admission: RE | Disposition: A | Discharge status: Home / Self Care | Payer: Self-pay | Source: Home / Self Care | Attending: Gastroenterology

## 2020-06-07 ENCOUNTER — Ambulatory Visit: Payer: Medicare Other | Admitting: Anesthesiology

## 2020-06-07 ENCOUNTER — Ambulatory Visit
Admission: RE | Admit: 2020-06-07 | Discharge: 2020-06-07 | Disposition: A | Discharge status: Home / Self Care | Payer: Medicare Other | Attending: Gastroenterology | Admitting: Gastroenterology

## 2020-06-07 ENCOUNTER — Other Ambulatory Visit: Payer: Self-pay

## 2020-06-07 DIAGNOSIS — K219 Gastro-esophageal reflux disease without esophagitis: Secondary | ICD-10-CM | POA: Insufficient documentation

## 2020-06-07 DIAGNOSIS — Z7901 Long term (current) use of anticoagulants: Secondary | ICD-10-CM | POA: Insufficient documentation

## 2020-06-07 DIAGNOSIS — Z7982 Long term (current) use of aspirin: Secondary | ICD-10-CM | POA: Diagnosis not present

## 2020-06-07 DIAGNOSIS — Z79899 Other long term (current) drug therapy: Secondary | ICD-10-CM | POA: Diagnosis not present

## 2020-06-07 DIAGNOSIS — Z888 Allergy status to other drugs, medicaments and biological substances status: Secondary | ICD-10-CM | POA: Diagnosis not present

## 2020-06-07 DIAGNOSIS — K449 Diaphragmatic hernia without obstruction or gangrene: Secondary | ICD-10-CM | POA: Diagnosis not present

## 2020-06-07 DIAGNOSIS — D509 Iron deficiency anemia, unspecified: Secondary | ICD-10-CM | POA: Diagnosis not present

## 2020-06-07 DIAGNOSIS — I10 Essential (primary) hypertension: Secondary | ICD-10-CM | POA: Insufficient documentation

## 2020-06-07 DIAGNOSIS — E785 Hyperlipidemia, unspecified: Secondary | ICD-10-CM | POA: Diagnosis not present

## 2020-06-07 HISTORY — DX: Transient cerebral ischemic attack, unspecified: G45.9

## 2020-06-07 HISTORY — PX: COLONOSCOPY WITH PROPOFOL: SHX5780

## 2020-06-07 HISTORY — PX: ESOPHAGOGASTRODUODENOSCOPY: SHX5428

## 2020-06-07 LAB — GLUCOSE, CAPILLARY: Glucose-Capillary: 133 mg/dL — ABNORMAL HIGH (ref 70–99)

## 2020-06-07 SURGERY — EGD (ESOPHAGOGASTRODUODENOSCOPY)
Anesthesia: General

## 2020-06-07 MED ORDER — PROPOFOL 500 MG/50ML IV EMUL
INTRAVENOUS | Status: AC
  Filled 2020-06-07: qty 50

## 2020-06-07 MED ORDER — PHENYLEPHRINE HCL (PRESSORS) 10 MG/ML IV SOLN
INTRAVENOUS | Status: DC | PRN
  Administered 2020-06-07: 200 ug via INTRAVENOUS

## 2020-06-07 MED ORDER — LIDOCAINE HCL (PF) 2 % IJ SOLN
INTRAMUSCULAR | Status: AC
  Filled 2020-06-07: qty 5

## 2020-06-07 MED ORDER — PROPOFOL 500 MG/50ML IV EMUL
INTRAVENOUS | Status: DC | PRN
  Administered 2020-06-07: 50 ug/kg/min via INTRAVENOUS

## 2020-06-07 MED ORDER — FENTANYL CITRATE (PF) 100 MCG/2ML IJ SOLN
INTRAMUSCULAR | Status: AC
  Filled 2020-06-07: qty 2

## 2020-06-07 MED ORDER — PROPOFOL 10 MG/ML IV BOLUS
INTRAVENOUS | Status: DC | PRN
  Administered 2020-06-07: 10 mg via INTRAVENOUS
  Administered 2020-06-07 (×2): 20 mg via INTRAVENOUS

## 2020-06-07 MED ORDER — SODIUM CHLORIDE 0.9 % IV SOLN: INTRAVENOUS | Status: DC

## 2020-06-07 MED ORDER — MIDAZOLAM HCL 2 MG/2ML IJ SOLN
INTRAMUSCULAR | Status: AC
  Filled 2020-06-07: qty 2

## 2020-06-07 MED ORDER — LIDOCAINE HCL (CARDIAC) PF 100 MG/5ML IV SOSY
PREFILLED_SYRINGE | INTRAVENOUS | Status: DC | PRN
  Administered 2020-06-07: 80 mg via INTRAVENOUS

## 2020-06-07 MED ORDER — INDOMETHACIN 50 MG RE SUPP
RECTAL | Status: AC
  Filled 2020-06-07: qty 2

## 2020-06-07 NOTE — Interval H&P Note (Signed)
History and Physical Interval Note:  06/07/2020 9:28 AM  [name]  has presented today for surgery, with the diagnosis of IDA.  The various methods of treatment have been discussed with the patient and family. After consideration of risks, benefits and other options for treatment, the patient has consented to  Procedure(s): ESOPHAGOGASTRODUODENOSCOPY (EGD) (N/A) COLONOSCOPY WITH PROPOFOL (N/A) as a surgical intervention.  The patient's history has been reviewed, patient examined, no change in status, stable for surgery.  I have reviewed the patient's chart and labs.  Questions were answered to the patient's satisfaction.     Lesly Rubenstein  Ok to proceed with EGD/Colonoscopy

## 2020-06-07 NOTE — Op Note (Signed)
[facility] Gastroenterology Patient Name: Derek Hogan Procedure Date: 06/07/2020 9:24 AM MRN: [id number] Account #: [id number] Date of Birth: 1945/06/12 Admit Type: Outpatient Age: 75 Room: [facility] ENDO ROOM 1 Gender: Male Note Status: Finalized Procedure:             Colonoscopy Indications:           Iron deficiency anemia Providers:             Andrey Farmer MD, MD Referring MD:          Ocie Cornfield. Ouida Sills MD, MD (Referring MD) Medicines:             Monitored Anesthesia Care Complications:         No immediate complications. Procedure:             Pre-Anesthesia Assessment:                        - Prior to the procedure, a History and Physical was                         performed, and patient medications and allergies were                         reviewed. The patient is competent. The risks and                         benefits of the procedure and the sedation options and                         risks were discussed with the patient. All questions                         were answered and informed consent was obtained.                         Patient identification and proposed procedure were                         verified by the physician, the nurse, the anesthetist                         and the technician in the endoscopy suite. Mental                         Status Examination: alert and oriented. Airway                         Examination: normal oropharyngeal airway and neck                         mobility. Respiratory Examination: clear to                         auscultation. CV Examination: normal. Prophylactic                         Antibiotics: The patient does not require prophylactic  antibiotics. Prior Anticoagulants: The patient has                         taken Xarelto (rivaroxaban), last dose was 3 days                         prior to procedure. ASA Grade Assessment: III - A                         patient  with severe systemic disease. After reviewing                         the risks and benefits, the patient was deemed in                         satisfactory condition to undergo the procedure. The                         anesthesia plan was to use monitored anesthesia care                         (MAC). Immediately prior to administration of                         medications, the patient was re-assessed for adequacy                         to receive sedatives. The heart rate, respiratory                         rate, oxygen saturations, blood pressure, adequacy of                         pulmonary ventilation, and response to care were                         monitored throughout the procedure. The physical                         status of the patient was re-assessed after the                         procedure.                        After obtaining informed consent, the colonoscope was                         passed under direct vision. Throughout the procedure,                         the patient's blood pressure, pulse, and oxygen                         saturations were monitored continuously. The                         Colonoscope was introduced through the anus and  advanced to the the cecum, identified by appendiceal                         orifice and ileocecal valve. The colonoscopy was                         technically difficult and complex due to a redundant                         colon. Successful completion of the procedure was                         aided by changing the patient to a supine position and                         applying abdominal pressure. The patient tolerated the                         procedure well. The quality of the bowel preparation                         was fair. Findings:      The perianal and digital rectal examinations were normal.      Semi-liquid stool was found in the entire colon, making visualization        difficult. Lavage of the area was performed using a large amount,       resulting in clearance with fair visualization.      The exam was otherwise without abnormality on direct and retroflexion       views. Impression:            - Preparation of the colon was fair.                        - Stool in the entire examined colon.                        - The examination was otherwise normal on direct and                         retroflexion views.                        - No specimens collected. Recommendation:        - Discharge patient to home.                        - Resume previous diet.                        - Resume Xarelto (rivaroxaban) at prior dose today.                        - No masses or large polyps seen but prep was fair.                         This colonoscopy would not count as a screening so  given blood donation, if IDA resolved then likely                         would attribute to blood donation, if not then patient                         would need repeat colonoscopy given prep. Procedure Code(s):     --- Professional ---                        [phone], Colonoscopy, flexible; diagnostic, including                         collection of specimen(s) by brushing or washing, when                         performed (separate procedure) Diagnosis Code(s):     --- Professional ---                        D50.9, Iron deficiency anemia, unspecified CPT copyright 2019 American Medical Association. All rights reserved. The codes documented in this report are preliminary and upon coder review may  be revised to meet current compliance requirements. Andrey Farmer MD, MD 06/07/2020 10:21:50 AM Number of Addenda: 0 Note Initiated On: 06/07/2020 9:24 AM Scope Withdrawal Time: 0 hours 6 minutes 10 seconds  Total Procedure Duration: 0 hours 31 minutes 31 seconds  Estimated Blood Loss:  Estimated blood loss: none.      [facility]

## 2020-06-07 NOTE — Transfer of Care (Signed)
Immediate Anesthesia Transfer of Care Note  Patient: Derek Hogan  Procedure(s) Performed: ESOPHAGOGASTRODUODENOSCOPY (EGD) (N/A ) COLONOSCOPY WITH PROPOFOL (N/A )  Patient Location: PACU  Anesthesia Type:General  Level of Consciousness: sedated  Airway & Oxygen Therapy: Patient Spontanous Breathing and Patient connected to nasal cannula oxygen  Post-op Assessment: Report given to RN and Post -op Vital signs reviewed and stable  Post vital signs: Reviewed and stable  Last Vitals:  Vitals Value Taken Time  BP 94/46 06/07/20 1020  Temp    Pulse 67 06/07/20 1021  Resp 16 06/07/20 1021  SpO2 99 % 06/07/20 1021  Vitals shown include unvalidated device data.  Last Pain:  Vitals:   06/07/20 1010  TempSrc: Temporal  PainSc:          Complications: No complications documented.

## 2020-06-07 NOTE — Op Note (Signed)
[facility] Gastroenterology Patient Name: Derek Hogan Procedure Date: 06/07/2020 9:25 AM MRN: [id number] Account #: [id number] Date of Birth: 1945/07/28 Admit Type: Outpatient Age: 75 Room: [facility] ENDO ROOM 1 Gender: Male Note Status: Finalized Procedure:             Upper GI endoscopy Indications:           Iron deficiency anemia Providers:             Andrey Farmer MD, MD Referring MD:          Ocie Cornfield. Ouida Sills MD, MD (Referring MD) Medicines:             Monitored Anesthesia Care Complications:         No immediate complications. Procedure:             Pre-Anesthesia Assessment:                        - Prior to the procedure, a History and Physical was                         performed, and patient medications and allergies were                         reviewed. The patient is competent. The risks and                         benefits of the procedure and the sedation options and                         risks were discussed with the patient. All questions                         were answered and informed consent was obtained.                         Patient identification and proposed procedure were                         verified by the physician, the nurse, the anesthetist                         and the technician in the endoscopy suite. Mental                         Status Examination: alert and oriented. Airway                         Examination: normal oropharyngeal airway and neck                         mobility. Respiratory Examination: clear to                         auscultation. CV Examination: normal. Prophylactic                         Antibiotics: The patient does not require prophylactic  antibiotics. Prior Anticoagulants: The patient has                         taken Xarelto (rivaroxaban), last dose was 3 days                         prior to procedure. ASA Grade Assessment: III - A                          patient with severe systemic disease. After reviewing                         the risks and benefits, the patient was deemed in                         satisfactory condition to undergo the procedure. The                         anesthesia plan was to use monitored anesthesia care                         (MAC). Immediately prior to administration of                         medications, the patient was re-assessed for adequacy                         to receive sedatives. The heart rate, respiratory                         rate, oxygen saturations, blood pressure, adequacy of                         pulmonary ventilation, and response to care were                         monitored throughout the procedure. The physical                         status of the patient was re-assessed after the                         procedure.                        After obtaining informed consent, the endoscope was                         passed under direct vision. Throughout the procedure,                         the patient's blood pressure, pulse, and oxygen                         saturations were monitored continuously. The Endoscope                         was introduced through the mouth, and advanced to the  second part of duodenum. The upper GI endoscopy was                         accomplished without difficulty. The patient tolerated                         the procedure well. Findings:      A 3 cm hiatal hernia was present.      The examined esophagus was normal.      The entire examined stomach was normal.      The examined duodenum was normal. Impression:            - 3 cm hiatal hernia.                        - Normal esophagus.                        - Normal stomach.                        - Normal examined duodenum.                        - No specimens collected. Recommendation:        - Perform a colonoscopy today. Procedure Code(s):     --- Professional ---                         [phone], Esophagogastroduodenoscopy, flexible,                         transoral; diagnostic, including collection of                         specimen(s) by brushing or washing, when performed                         (separate procedure) Diagnosis Code(s):     --- Professional ---                        K44.9, Diaphragmatic hernia without obstruction or                         gangrene                        D50.9, Iron deficiency anemia, unspecified CPT copyright 2019 American Medical Association. All rights reserved. The codes documented in this report are preliminary and upon coder review may  be revised to meet current compliance requirements. Andrey Farmer MD, MD 06/07/2020 10:16:37 AM Number of Addenda: 0 Note Initiated On: 06/07/2020 9:25 AM Estimated Blood Loss:  Estimated blood loss: none.      [facility]

## 2020-06-07 NOTE — Anesthesia Postprocedure Evaluation (Signed)
Anesthesia Post Note  Patient: Derek Hogan  Procedure(s) Performed: ESOPHAGOGASTRODUODENOSCOPY (EGD) (N/A ) COLONOSCOPY WITH PROPOFOL (N/A )  Patient location during evaluation: Endoscopy Anesthesia Type: General Level of consciousness: awake and alert and oriented Pain management: pain level controlled Vital Signs Assessment: post-procedure vital signs reviewed and stable Respiratory status: spontaneous breathing Cardiovascular status: blood pressure returned to baseline Anesthetic complications: no   No complications documented.   Last Vitals:  Vitals:   06/07/20 1010 06/07/20 1020  BP: 97/60 (!) 94/46  Pulse: 65 64  Resp: (!) 8 15  Temp: 36.5 C   SpO2: 97% 99%    Last Pain:  Vitals:   06/07/20 1010  TempSrc: Temporal  PainSc:                  Felix Pratt

## 2020-06-07 NOTE — Interval H&P Note (Signed)
History and Physical Interval Note:  06/07/2020 9:28 AM  Derek Hogan  has presented today for surgery, with the diagnosis of IDA.  The various methods of treatment have been discussed with the patient and family. After consideration of risks, benefits and other options for treatment, the patient has consented to  Procedure(s): ESOPHAGOGASTRODUODENOSCOPY (EGD) (N/A) COLONOSCOPY WITH PROPOFOL (N/A) as a surgical intervention.  The patient's history has been reviewed, patient examined, no change in status, stable for surgery.  I have reviewed the patient's chart and labs.  Questions were answered to the patient's satisfaction.     Lesly Rubenstein  Ok to proceed with colonoscopy

## 2020-06-07 NOTE — Anesthesia Preprocedure Evaluation (Signed)
Anesthesia Evaluation  Patient identified by MRN, date of birth, ID band Patient awake    Reviewed: Allergy & Precautions, NPO status , Patient's Chart, lab work & pertinent test results  Airway Mallampati: III  TM Distance: >3 FB     Dental   Pulmonary shortness of breath and with exertion, former smoker,    Pulmonary exam normal        Cardiovascular hypertension, + CAD, + Past MI and + Peripheral Vascular Disease  Normal cardiovascular exam     Neuro/Psych TIAnegative psych ROS   GI/Hepatic Neg liver ROS, GERD  ,  Endo/Other  diabetes  Renal/GU negative Renal ROS  negative genitourinary   Musculoskeletal   Abdominal Normal abdominal exam  (+)   Peds negative pediatric ROS (+)  Hematology negative hematology ROS (+)   Anesthesia Other Findings Past Medical History: No date: CAD, ARTERY BYPASS GRAFT     Comment:  a. 1996 s/p CABG x 4 (LIMA->LAD, VG->D1, VG->OM3,               VG->RPDA; b. 12/2016 [facility]): EF 47%, anteroapical and              inf infarcts w/ anteroapical peri-infarct ischemia; c.               01/2017: Cath: LM 33m/d, LAD 100p/m, D1 90ost, 70p, LCX               80ost/p, 134m, RCA 67m, 75d, RPDA 95ost, VG->RPDA 30p/m,               VG->OM3 nl, LIMA->LAD nl, VG->D1 nl. No date: Carotid arterial disease ([place])     Comment:  a. s/p remote R CEA (Dr. Drucie Opitz); b. 01/2018 Carotid              U/S: RICA 6-27%, RCCA <03%, LICA 5-00%. F/u 12 mos. No date: Dyslipidemia 06/09/2008: ERECTILE DYSFUNCTION No date: Erectile dysfunction No date: GERD (gastroesophageal reflux disease) 1996: Heart attack ([place]) 06/09/2008: HYPERLIPIDEMIA-MIXED No date: Hypertension 06/09/2008: HYPERTENSION, UNSPECIFIED No date: Orthostatic hypotension No date: PVD (peripheral vascular disease) ([place])     Comment:  a. 02/2014 s/p DBA/stent to prox R popliteal; b.  08/2017               Vasc u/s/TBI: Patent mid R pop stent. LSFA  30-40%. TBI: R              0.79, L 0.51 - no significant change, f/u 1 yr. No date: Transient ischemic attack (TIA) dx'd 08/20/1966: Type I diabetes mellitus ([place]) 10/06/2008: UNSPECIFIED PERIPHERAL VASCULAR DISEASE No date: Viral gastroenteritis     Comment:  04/19/2010 improved No date: Weakness  Reproductive/Obstetrics                             Anesthesia Physical Anesthesia Plan  ASA: III  Anesthesia Plan: General   Post-op Pain Management:    Induction: Intravenous  PONV Risk Score and Plan:   Airway Management Planned: Nasal Cannula  Additional Equipment:   Intra-op Plan:   Post-operative Plan:   Informed Consent: I have reviewed the patients History and Physical, chart, labs and discussed the procedure including the risks, benefits and alternatives for the proposed anesthesia with the patient or authorized representative who has indicated his/her understanding and acceptance.     Dental advisory given  Plan Discussed with: CRNA and Surgeon  Anesthesia Plan Comments:  Anesthesia Quick Evaluation  

## 2020-06-07 NOTE — H&P (Signed)
Outpatient short stay form Pre-procedure 06/07/2020 9:25 AM Derek Miyamoto MD, MPH  Primary Physician: Dr. Ouida Sills  Reason for visit:  IDA  History of present illness:   75 y/o gentleman with history of IDA but was donating blood at the time. Had negative cologuard last year. No family history of GI malignancies. No abdominal surgeries. He does takes a NOAC with last dose 3 days ago. No overt GI bleeding.    Current Facility-Administered Medications:  .  0.9 %  sodium chloride infusion, , Intravenous, Continuous, Zaineb Nowaczyk, Hilton Cork, MD, Last Rate: 20 mL/hr at 06/07/20 0920, New Bag at 06/07/20 0920  Medications Prior to Admission  Medication Sig Dispense Refill Last Dose  . aspirin 81 MG tablet Take 162 mg by mouth at bedtime.    Past Week at Unknown time  . atorvastatin (LIPITOR) 80 MG tablet Take 1 tablet (80 mg total) by mouth daily.   06/06/2020 at 1800  . Calcium Carb-Cholecalciferol 500-100 MG-UNIT CHEW CHEW 1 TABLET BY MOUTH TWO TIMES A DAY FOR BONE HEALTH   06/06/2020 at 0800  . carvedilol (COREG) 3.125 MG tablet Take 3.125 mg by mouth 2 (two) times daily with a meal.   06/06/2020 at 1800  . Cholecalciferol 25 MCG (1000 UT) tablet Take 1 tablet by mouth daily.   06/06/2020 at 08500  . furosemide (LASIX) 20 MG tablet Take 40 mg by mouth as needed.   Past Week at Unknown time  . glucagon 1 MG injection INJECT 1MG KIT INTRAMUSCULARLY  AS NEEDED FOR EMERGENCY HYPOGLYCEMIA   Past Month at Unknown time  . hydrOXYzine (ATARAX/VISTARIL) 25 MG tablet Take 1 tablet by mouth daily as needed.   Past Week at Unknown time  . insulin aspart (NOVOLOG) 100 unit/mL injection Inject into the skin continuous. Insulin Pump   06/07/2020 at Unknown time  . lidocaine (LIDODERM) 5 % Place 1 patch onto the skin daily. Remove & Discard patch within 12 hours or as directed by MD   Past Week at Unknown time  . losartan (COZAAR) 100 MG tablet Take 100 mg by mouth daily.   06/06/2020 at 0800  . nitroGLYCERIN (NITROSTAT)  0.4 MG SL tablet Place 0.4 mg under the tongue every 5 (five) minutes as needed for chest pain.    Past Month at Unknown time  . omeprazole (PRILOSEC) 20 MG capsule Take 20 mg by mouth every morning.    06/06/2020 at 0800  . rivaroxaban (XARELTO) 2.5 MG TABS tablet Take 1 tablet (2.5 mg total) by mouth 2 (two) times daily. 60 tablet 6 06/04/2020 at Unknown time  . sertraline (ZOLOFT) 100 MG tablet Take 100 mg by mouth daily.   06/06/2020 at 0800  . traZODone (DESYREL) 50 MG tablet Take 25 mg by mouth at bedtime.   06/06/2020 at 2200  . HYDROcodone-acetaminophen (NORCO/VICODIN) 5-325 MG tablet Take 1 tablet by mouth every 6 (six) hours as needed for moderate pain. (Patient not taking: Reported on 06/07/2020)   Completed Course at Unknown time  . methocarbamol (ROBAXIN) 500 MG tablet Take 500 mg by mouth. (Patient not taking: Reported on 06/07/2020)   Completed Course at Unknown time     Allergies  Allergen Reactions  . Lisinopril Other (See Comments)  . Simvastatin     Other reaction(s): NONE STATED     Past Medical History:  Diagnosis Date  . CAD, ARTERY BYPASS GRAFT    a. 1996 s/p CABG x 4 (LIMA->LAD, VG->D1, VG->OM3, VG->RPDA; b. 12/2016 [facility]): EF 47%,  anteroapical and inf infarcts w/ anteroapical peri-infarct ischemia; c. 01/2017: Cath: LM 25md, LAD 100p/m, D1 90ost, 70p, LCX 80ost/p, 1056mRCA 4067m5d, RPDA 95ost, VG->RPDA 30p/m, VG->OM3 nl, LIMA->LAD nl, VG->D1 nl.  . Car[place]id arterial disease ([place]  a. s/p remote R CEA (Dr. GreDrucie Opitzb. 01/2018 Carotid U/S: RICA 1-37-02%CCA <50<63%ICA 1-37-85%/u 12 mos.  . Dyslipidemia   . ERECTILE DYSFUNCTION 06/09/2008  . Erectile dysfunction   . GERD (gastroesophageal reflux disease)   . [place]  . HYPERLIPIDEMIA-MIXED 06/09/2008  . Hypertension   . HYPERTENSION, UNSPECIFIED 06/09/2008  . Orthostatic hypotension   . PVD (peripheral vascular disease) ([place]  a. 02/2014 s/p DBA/stent to prox R popliteal; b.  08/2017 Vasc u/s/TBI:  Patent mid R pop stent. LSFA 30-40%. TBI: R 0.79, L 0.51 - no significant change, f/u 1 yr.  . Transient ischemic attack (TIA)   . Type I diabetes mellitus ([place] 08/20/1966  . UNSPECIFIED PERIPHERAL VASCULAR DISEASE 10/06/2008  . Viral gastroenteritis    04/19/2010 improved  . Weakness     Review of systems:  Otherwise negative.    Physical Exam  Gen: Alert, oriented. Appears stated age.  HEENT: PERRLA. Lungs: No respiratory distress CV: RRR Abd: soft, benign, no masses Ext: No edema    Planned procedures: Proceed with EGD/colonoscopy. The patient understands the nature of the planned procedure, indications, risks, alternatives and potential complications including but not limited to bleeding, infection, perforation, damage to internal organs and possible oversedation/side effects from anesthesia. The patient agrees and gives consent to proceed.  Please refer to procedure notes for findings, recommendations and patient disposition/instructions.     TreRaylene Hogan, MPH Gastroenterology 06/07/2020  9:25 AM

## 2020-06-09 ENCOUNTER — Encounter: Payer: Self-pay | Admitting: Gastroenterology

## 2020-06-23 DIAGNOSIS — M47816 Spondylosis without myelopathy or radiculopathy, lumbar region: Secondary | ICD-10-CM | POA: Diagnosis not present

## 2020-08-10 ENCOUNTER — Telehealth: Payer: Self-pay | Admitting: Cardiovascular Disease

## 2020-08-10 DIAGNOSIS — M47816 Spondylosis without myelopathy or radiculopathy, lumbar region: Secondary | ICD-10-CM | POA: Diagnosis not present

## 2020-08-10 NOTE — Telephone Encounter (Signed)
Spoke with the patient. Patient reports increased LE edema in his right leg up to his knee only. He denies injury to his leg. He denies pain (no pain w/ambulation), warmth  or redness to the leg. He denies sob or weight gain.  This has been going on for several weeks.  He did take his prn lasix 40 mg daily for 1 week with no improvement. Adv him to elevate his legs as much as possible, he could try compression socks as well. Adv the patient that I will fwd the message to Dr. Fletcher Anon and call back with his response and recommendation. Patient verbalized understanding and voiced appreciation for the call.

## 2020-08-10 NOTE — Telephone Encounter (Signed)
Agree with your management.  If symptoms persist, will evaluate when he comes for follow-up next month.

## 2020-08-10 NOTE — Telephone Encounter (Signed)
Pt c/o swelling: STAT is pt has developed SOB within 24 hours  If swelling, where is the swelling located? R Leg 2 weeks   How much weight have you gained and in what time span? Denies weight gain  Have you gained 3 pounds in a day or 5 pounds in a week? no  Do you have a log of your daily weights (if so, list)? no  Are you currently taking a fluid pill? yes  Are you currently SOB? no  Have you traveled recently?  unknown

## 2020-08-10 NOTE — Telephone Encounter (Signed)
Patient made aware of Dr. Arida's response and recommendation. Patient verbalized understanding and voiced appreciation for the call.  

## 2020-08-20 ENCOUNTER — Telehealth: Payer: Self-pay | Admitting: Physician Assistant

## 2020-08-20 NOTE — Telephone Encounter (Signed)
   After Hours Note  Patient's niece, who is a Software engineer with our clinic [facility], reached out to me this afternoon as patient is established with our practice and he has had increased lower extremity edema. Chart reviewed - h/o CAD s/p prior MI, CABG, TIA, carotid disease, DM, HTN, HLD, PAD, RLE edema/venous insufficiency. He was previously seen in 09/2019 for worsening right lower extremity edema felt due to venous insufficiency. LE venous duplex 09/2019 was negative for DVT. At that time he was prescribed Lasix 20mg  daily but did not have significant relief so required 40mg  dose for a period of time. When seen in follow-up 03/2020 this was felt to be stable. Last labs 04/2020 showed K 5.0, Cr 0.90. Last EF assessment was by nuc in 06/2019, EF 52%.  Prior to recently he had only been taking 20mg  daily PRN but a few weeks ago had to go to 40mg  daily for a week because of increased RLE edema (see 08/10/20 phone note). This did not improve his swelling but did produce extra urination so he went back to 20mg  daily and swelling has persisted. Furthermore, he is is visiting out of town and has been out of his Lasix for 2 days so swelling further increased. Melissa encouraged him to seek care in person where he is for labs/exam, but he does not wish to do so. Melissa spoke with family member who is also medical and they felt it would be reasonable for patient to increase Lasix further for a few days - this seems reasonable to do Lasix 40mg  BID x 2 days then return to 40mg  daily - based on chart it would seem this swelling was previously felt venous insufficiency rather than CHF, so encouraged mainstay of rx to be compression/elevation/limitation of sodium. I spoke with patient who agrees with plan and also granted permission to speak with Melissa to help relay further correspondence if needed.  Will route to Dr. Fletcher Anon for further review and advisement - next OV is late August, may need to be seen sooner if edema  dose not improve. Query whether a trial of torsemide instead may be of use.  Escher Harr PA-C

## 2020-08-23 ENCOUNTER — Telehealth: Payer: Self-pay | Admitting: Cardiovascular Disease

## 2020-08-23 NOTE — Telephone Encounter (Cosign Needed Addendum)
Called the patient. Derek Hogan. When patient returns the call ok to offer him the held DOD appt with Dr. Fletcher Anon 08/26/20 @ 3:20pm.

## 2020-08-23 NOTE — Telephone Encounter (Signed)
Scheduled per below

## 2020-08-23 NOTE — Telephone Encounter (Signed)
Pt c/o swelling: STAT is pt has developed SOB within 24 hours  If swelling, where is the swelling located? R leg only   How much weight have you gained and in what time span? 8 lbs over the last 6 months  Have you gained 3 pounds in a day or 5 pounds in a week? no  Do you have a log of your daily weights (if so, list)? \no  Are you currently taking a fluid pill? yes  Are you currently SOB? With exertion   Have you traveled recently? no

## 2020-08-24 NOTE — Telephone Encounter (Signed)
Ok thanks. He is scheduled to see me in 2 days.

## 2020-08-25 DIAGNOSIS — M47816 Spondylosis without myelopathy or radiculopathy, lumbar region: Secondary | ICD-10-CM | POA: Diagnosis not present

## 2020-08-26 ENCOUNTER — Ambulatory Visit (INDEPENDENT_AMBULATORY_CARE_PROVIDER_SITE_OTHER): Payer: Medicare Other | Admitting: Cardiovascular Disease

## 2020-08-26 ENCOUNTER — Other Ambulatory Visit: Payer: Self-pay

## 2020-08-26 ENCOUNTER — Encounter: Payer: Self-pay | Admitting: Cardiovascular Disease

## 2020-08-26 VITALS — BP 120/60 | HR 76 | Ht 74.0 in | Wt 263.5 lb

## 2020-08-26 DIAGNOSIS — R609 Edema, unspecified: Secondary | ICD-10-CM

## 2020-08-26 DIAGNOSIS — E785 Hyperlipidemia, unspecified: Secondary | ICD-10-CM | POA: Diagnosis not present

## 2020-08-26 DIAGNOSIS — R0602 Shortness of breath: Secondary | ICD-10-CM | POA: Diagnosis not present

## 2020-08-26 DIAGNOSIS — I6529 Occlusion and stenosis of unspecified carotid artery: Secondary | ICD-10-CM

## 2020-08-26 DIAGNOSIS — I2510 Atherosclerotic heart disease of native coronary artery without angina pectoris: Secondary | ICD-10-CM

## 2020-08-26 DIAGNOSIS — I739 Peripheral vascular disease, unspecified: Secondary | ICD-10-CM | POA: Diagnosis not present

## 2020-08-26 NOTE — Progress Notes (Signed)
Cardiology Office Note   Date:  08/26/2020   ID:  Derek, Hogan [birth year], MRN [id number]  PCP:  Kirk Ruths, MD  Cardiologist:  Dr. Fletcher Anon  Chief Complaint  Patient presents with   Other    Edema ankles/feet. Meds reviewed verbally with pt.      History of Present Illness: Derek Hogan is a 75 y.o. male who presents for a follow-up visit regarding coronary artery disease and peripheral arterial disease.  He has known history of CAD S/P previous inferior wall myocardial infarction followed by CABG in 1996. He had questionable TIA in March 2016. He quit smoking in 1996 after CABG. He has prolonged history of diabetes currently on insulin pump, Carotid disease status post right carotid endarterectomy, hyperlipidemia and hypertension. He is known to have peripheral arterial disease.  He had drug-coated balloon angioplasty followed by self-expanding stent placement to the proximal right popliteal artery for severe claudication in November 2016.  He had a Lexiscan Myoview in May 2021 which showed no evidence of ischemia.   He is known to have right leg edema due to suspected venous insufficiency.  He had lower extremity venous Doppler in August 2021 which showed no evidence of DVT.  He now reports worsening right leg swelling which is usually worse at the end of the day.  No significant edema affecting the left leg.  He describes mild exertional dyspnea.  He does have furosemide at home and took that for a while but did not notice any improvement in his leg edema.  No chest pain.  No significant claudication.   Past Medical History:  Diagnosis Date   CAD, ARTERY BYPASS GRAFT    a. 1996 s/p CABG x 4 (LIMA->LAD, VG->D1, VG->OM3, VG->RPDA; b. 12/2016 [facility]): EF 47%, anteroapical and inf infarcts w/ anteroapical peri-infarct ischemia; c. 01/2017: Cath: LM 58md, LAD 100p/m, D1 90ost, 70p, LCX 80ost/p, 1054mRCA 4059m5d, RPDA 95ost, VG->RPDA 30p/m, VG->OM3 nl,  LIMA->LAD nl, VG->D1 nl.   Carotid arterial disease ([facility]CC[facility]  a. s/p remote R CEA (Dr. GreDrucie Opitzb. 01/2018 Carotid U/S: RICA 1-33-30%CCA <50<07%ICA 1-36-22%/u 12 mos.   Dyslipidemia    ERECTILE DYSFUNCTION 06/09/2008   Erectile dysfunction    GERD (gastroesophageal reflux disease)    Heart attack ([facility]996   HYPERLIPIDEMIA-MIXED 06/09/2008   Hypertension    HYPERTENSION, UNSPECIFIED 06/09/2008   Orthostatic hypotension    PVD (peripheral vascular disease) (HCCNebo  a. 02/2014 s/p DBA/stent to prox R popliteal; b.  08/2017 Vasc u/s/TBI: Patent mid R pop stent. LSFA 30-40%. TBI: R 0.79, L 0.51 - no significant change, f/u 1 yr.   Transient ischemic attack (TIA)    Type I diabetes mellitus (HCC[place]irleysburgx'd 08/20/1966   UNSPECIFIED PERIPHERAL VASCULAR DISEASE 10/06/2008   Viral gastroenteritis    04/19/2010 improved   Weakness     Past Surgical History:  Procedure Laterality Date   CARDIAC CATHETERIZATION  1996; ~ 2010   CAROTID ENDARTERECTOMY Right    CATARACT EXTRACTION, BILATERAL Bilateral    COLONOSCOPY WITH PROPOFOL N/A 06/07/2020   Procedure: COLONOSCOPY WITH PROPOFOL;  Surgeon: LocLesly RubensteinD;  Location: [facility] ENDOSCOPY;  Service: Endoscopy;  Laterality: N/A;   CORONARY ANGIOPLASTY     CORONARY ARTERY BYPASS GRAFT  1996   "CABG X4"   ESOPHAGOGASTRODUODENOSCOPY N/A 06/07/2020   Procedure: ESOPHAGOGASTRODUODENOSCOPY (EGD);  Surgeon: LocLesly RubensteinD;  [facility];  Service: Endoscopy;  Laterality:  N/A;   LEFT HEART CATH AND CORS/GRAFTS ANGIOGRAPHY N/A 01/11/2017   Procedure: LEFT HEART CATH AND CORS/GRAFTS ANGIOGRAPHY;  Surgeon: Jolaine Artist, MD;  Location: [place] [facility];  Service: Cardiovascular;  Laterality: N/A;   MOHS SURGERY     PERIPHERAL VASCULAR CATHETERIZATION N/A 12/15/2014   Procedure: Abdominal Aortogram;  Surgeon: Wellington Hampshire, MD;  Location: [facility];  Service: Cardiovascular;  Laterality: N/A;   REFRACTIVE SURGERY Right     "before cataract OR"     Current Outpatient Medications  Medication Sig Dispense Refill   aspirin 81 MG tablet Take 162 mg by mouth at bedtime.      atorvastatin (LIPITOR) 80 MG tablet Take 1 tablet (80 mg total) by mouth daily.     Calcium Carb-Cholecalciferol 500-100 MG-UNIT CHEW CHEW 1 TABLET BY MOUTH TWO TIMES A DAY FOR BONE HEALTH     carvedilol (COREG) 3.125 MG tablet Take 3.125 mg by mouth 2 (two) times daily with a meal.     Cholecalciferol 25 MCG (1000 UT) tablet Take 1 tablet by mouth daily.     furosemide (LASIX) 20 MG tablet Take 40 mg by mouth as needed.     glucagon 1 MG injection INJECT 1MG KIT INTRAMUSCULARLY  AS NEEDED FOR EMERGENCY HYPOGLYCEMIA     HYDROcodone-acetaminophen (NORCO/VICODIN) 5-325 MG tablet Take 1 tablet by mouth every 6 (six) hours as needed for moderate pain.     hydrOXYzine (ATARAX/VISTARIL) 25 MG tablet Take 1 tablet by mouth daily as needed.     insulin aspart (NOVOLOG) 100 unit/mL injection Inject into the skin continuous. Insulin Pump     lidocaine (LIDODERM) 5 % Place 1 patch onto the skin daily. Remove & Discard patch within 12 hours or as directed by MD     losartan (COZAAR) 100 MG tablet Take 100 mg by mouth daily.     nitroGLYCERIN (NITROSTAT) 0.4 MG SL tablet Place 0.4 mg under the tongue every 5 (five) minutes as needed for chest pain.      omeprazole (PRILOSEC) 20 MG capsule Take 20 mg by mouth every morning.      rivaroxaban (XARELTO) 2.5 MG TABS tablet Take 1 tablet (2.5 mg total) by mouth 2 (two) times daily. 60 tablet 6   sertraline (ZOLOFT) 100 MG tablet Take 100 mg by mouth daily.     traZODone (DESYREL) 50 MG tablet Take 25 mg by mouth at bedtime.     methocarbamol (ROBAXIN) 500 MG tablet Take 500 mg by mouth. (Patient not taking: No sig reported)     No current facility-administered medications for this visit.    Allergies:   Lisinopril and Simvastatin    Social History:  The patient  reports that he quit smoking about 26 years  ago. His smoking use included cigarettes. He has a 87.50 pack-year smoking history. He has never used smokeless tobacco. He reports previous alcohol use. He reports that he does not use drugs.   Family History:  The patient's family history includes Coronary artery disease in his father and mother; Diabetes in his father and mother; Stroke in an other family member.    ROS:  Please see the history of present illness.   Otherwise, review of systems are positive for none.   All other systems are reviewed and negative.    PHYSICAL EXAM: VS:  BP 120/60 (BP Location: Left Arm, Patient Position: Sitting, Cuff Size: Large)   Pulse 76   Ht '6\' 2"'  (1.88 m)  Wt 263 lb 8 oz (119.5 kg)   SpO2 98%   BMI 33.83 kg/m  , BMI Body mass index is 33.83 kg/m. GEN: Well nourished, well developed, in no acute distress  HEENT: normal  Neck: no JVD, or masses. Right carotid bruit Cardiac: RRR; no murmurs, rubs, or gallops, moderate right leg swelling. Respiratory:  clear to auscultation bilaterally, normal work of breathing GI: soft, nontender, nondistended, + BS MS: no deformity or atrophy  Skin: warm and dry, no rash Neuro:  Strength and sensation are intact Psych: euthymic mood, full affect Vascular: Femoral pulse is difficult to palpate bilaterally.  EKG:  EKG is ordered today. EKG showed n normal sinus rhythm with possible old inferior infarct  Recent Labs: No results found for requested labs within last 8760 hours.    Lipid Panel    Component Value Date/Time   CHOL 116 07/08/2014 0830   TRIG 51 07/08/2014 0830   HDL 43 07/08/2014 0830   CHOLHDL 2.7 07/08/2014 0830   CHOLHDL 2.7 07/19/2010 0839   VLDL 10 07/19/2010 0839   LDLCALC 63 07/08/2014 0830      Wt Readings from Last 3 Encounters:  08/26/20 263 lb 8 oz (119.5 kg)  06/07/20 257 lb (116.6 kg)  03/29/20 258 lb 8 oz (117.3 kg)        ASSESSMENT AND PLAN:  1.  Peripheral arterial disease: Status post angioplasty and stent  placement to the right popliteal artery.  The patient is overall stable with no calf claudication.  Continue medical therapy.  He is on aspirin and low-dose Xarelto. I reviewed most recent Doppler studies which were done in April which showed patent right popliteal artery stent with no significant aortoiliac disease.  2. Coronary artery disease involving native coronary arteries without angina: He continues to be asymptomatic. Continue medical therapy.    3. Hyperlipidemia: Continue treatment with high dose atorvastatin with a target LDL of less than 70.   4. Carotid disease status post right carotid endarterectomy: Carotid Doppler in January of last year showed no obstructive disease.  5.  Right lower extremity swelling: This is likely due to chronic venous insufficiency.  Less likely DVT due to heart failure but given some associated dyspnea, will obtain an echocardiogram.  He did not respond to furosemide and thus we will try torsemide 20 mg daily.  We will check basic metabolic profile and BNP in 1 week. I discussed with him the importance of leg elevation during the day and I also prescribed low pressure knee-high support stockings to be used during the day.   Disposition:   FU with me in 3 months  Signed,  Kathlyn Sacramento, MD  08/26/2020 3:45 PM    [place]

## 2020-08-26 NOTE — Patient Instructions (Signed)
Medication Instructions:   Your physician has recommended you make the following change in your medication:    STOP taking Lasix (Furosemide).  2.    START taking Torsemide 20 MG once a day.  *If you need a refill on your cardiac medications before your next appointment, please call your pharmacy*   Lab Work:  Your physician recommends that you return for lab work in: [place]  - Please go to the [facility]. You will check in at the front desk to the right as you walk into the atrium. Valet Parking is offered if needed. - No appointment needed. You may go any day between 7 am and 6 pm.      Testing/Procedures:   Your physician has requested that you have an echocardiogram. Echocardiography is a painless test that uses sound waves to create images of your heart. It provides your doctor with information about the size and shape of your heart and how well your heart's chambers and valves are working. This procedure takes approximately one hour. There are no restrictions for this procedure.    Follow-Up: At [facility], you and your health needs are our priority.  As part of our continuing mission to provide you with exceptional heart care, we have created designated Provider Care Teams.  These Care Teams include your primary Cardiologist (physician) and Advanced Practice Providers (APPs -  Physician Assistants and Nurse Practitioners) who all work together to provide you with the care you need, when you need it.  We recommend signing up for the patient portal called "MyChart".  Sign up information is provided on this After Visit Summary.  MyChart is used to connect with patients for Virtual Visits (Telemedicine).  Patients are able to view lab/test results, encounter notes, upcoming appointments, etc.  Non-urgent messages can be sent to your provider as well.   To learn more about what you can do with MyChart, go to [email or web address].    Your next appointment:   3  month(s)  The format for your next appointment:   In Person  Provider:   You may see Kathlyn Sacramento, MD or one of the following Advanced Practice Providers on your designated Care Team:   Murray Hodgkins, NP Christell Faith, PA-C Marrianne Mood, PA-C Cadence Kathlen Mody, [place]   Other Instructions

## 2020-08-29 ENCOUNTER — Telehealth: Payer: Self-pay | Admitting: Cardiovascular Disease

## 2020-08-29 MED ORDER — TORSEMIDE 20 MG PO TABS
20.0000 mg | ORAL_TABLET | Freq: Every day | ORAL | 0 refills | Status: DC
Start: 2020-08-29 — End: 2020-12-01

## 2020-08-29 NOTE — Telephone Encounter (Signed)
*  STAT* If patient is at the pharmacy, call can be transferred to refill team.   1. Which medications need to be refilled? (please list name of each medication and dose if known) Torsemide 20 MG once a day  2. Which pharmacy/location (including street and city if local pharmacy) is medication to be sent to? [facility] [address] [place]  3. Do they need a 30 day or 90 day supply? 90 day

## 2020-08-29 NOTE — Telephone Encounter (Signed)
Requested Prescriptions   Signed Prescriptions Disp Refills   torsemide (DEMADEX) 20 MG tablet 90 tablet 0    Sig: Take 1 tablet (20 mg total) by mouth daily.    Authorizing Provider: Kathlyn Sacramento A    Ordering User: Raelene Bott, Winta Barcelo L

## 2020-09-07 ENCOUNTER — Other Ambulatory Visit
Admission: RE | Admit: 2020-09-07 | Discharge: 2020-09-07 | Disposition: A | Discharge status: Home / Self Care | Payer: Medicare Other | Attending: Cardiovascular Disease | Admitting: Cardiovascular Disease

## 2020-09-07 DIAGNOSIS — R609 Edema, unspecified: Secondary | ICD-10-CM | POA: Diagnosis not present

## 2020-09-07 DIAGNOSIS — R0602 Shortness of breath: Secondary | ICD-10-CM | POA: Diagnosis not present

## 2020-09-07 LAB — BRAIN NATRIURETIC PEPTIDE: B Natriuretic Peptide: 83.2 pg/mL (ref 0.0–100.0)

## 2020-09-07 LAB — BASIC METABOLIC PANEL
Anion gap: 9 (ref 5–15)
BUN: 18 mg/dL (ref 8–23)
CO2: 25 mmol/L (ref 22–32)
Calcium: 9.4 mg/dL (ref 8.9–10.3)
Chloride: 106 mmol/L (ref 98–111)
Creatinine, Ser: 0.96 mg/dL (ref 0.61–1.24)
GFR, Estimated: >60 mL/min (ref >60)
Glucose, Bld: 151 mg/dL — ABNORMAL HIGH (ref 70–99)
Potassium: 4.4 mmol/L (ref 3.5–5.1)
Sodium: 140 mmol/L (ref 135–145)

## 2020-09-08 DIAGNOSIS — M47816 Spondylosis without myelopathy or radiculopathy, lumbar region: Secondary | ICD-10-CM | POA: Diagnosis not present

## 2020-09-12 ENCOUNTER — Telehealth: Payer: Self-pay | Admitting: *Deleted

## 2020-09-12 NOTE — Telephone Encounter (Signed)
Results reviewed with patient and he verbalized understanding with no further questions at this time.

## 2020-09-12 NOTE — Telephone Encounter (Signed)
-----   Message from Wellington Hampshire, MD sent at 09/08/2020  2:14 PM EDT ----- Normal labs after the addition of torsemide.  BNP is normal indicating no significant heart failure.

## 2020-09-12 NOTE — Telephone Encounter (Signed)
Left voicemail message to call back for review of results.  

## 2020-09-19 ENCOUNTER — Ambulatory Visit (INDEPENDENT_AMBULATORY_CARE_PROVIDER_SITE_OTHER): Payer: Medicare Other

## 2020-09-19 ENCOUNTER — Other Ambulatory Visit: Payer: Self-pay

## 2020-09-19 DIAGNOSIS — R0602 Shortness of breath: Secondary | ICD-10-CM | POA: Diagnosis not present

## 2020-09-19 LAB — ECHOCARDIOGRAM COMPLETE
Area-P 1/2: 2.26 cm2
Calc EF: 61.6 %
S' Lateral: 3.7 cm
Single Plane A2C EF: 68 %
Single Plane A4C EF: 54.3 %

## 2020-09-19 MED ORDER — PERFLUTREN LIPID MICROSPHERE
1.0000 mL | INTRAVENOUS | Status: AC | PRN
Start: 2020-09-19 — End: 2020-09-19
  Administered 2020-09-19: 2 mL via INTRAVENOUS

## 2020-09-22 DIAGNOSIS — M47816 Spondylosis without myelopathy or radiculopathy, lumbar region: Secondary | ICD-10-CM | POA: Diagnosis not present

## 2020-09-27 ENCOUNTER — Ambulatory Visit: Payer: Medicare Other | Admitting: Cardiovascular Disease

## 2020-10-04 DIAGNOSIS — C44529 Squamous cell carcinoma of skin of other part of trunk: Secondary | ICD-10-CM | POA: Diagnosis not present

## 2020-10-04 DIAGNOSIS — X32XXXA Exposure to sunlight, initial encounter: Secondary | ICD-10-CM | POA: Diagnosis not present

## 2020-10-04 DIAGNOSIS — D2271 Melanocytic nevi of right lower limb, including hip: Secondary | ICD-10-CM | POA: Diagnosis not present

## 2020-10-04 DIAGNOSIS — Z85828 Personal history of other malignant neoplasm of skin: Secondary | ICD-10-CM | POA: Diagnosis not present

## 2020-10-04 DIAGNOSIS — D2272 Melanocytic nevi of left lower limb, including hip: Secondary | ICD-10-CM | POA: Diagnosis not present

## 2020-10-04 DIAGNOSIS — D485 Neoplasm of uncertain behavior of skin: Secondary | ICD-10-CM | POA: Diagnosis not present

## 2020-10-04 DIAGNOSIS — Z08 Encounter for follow-up examination after completed treatment for malignant neoplasm: Secondary | ICD-10-CM | POA: Diagnosis not present

## 2020-10-04 DIAGNOSIS — L570 Actinic keratosis: Secondary | ICD-10-CM | POA: Diagnosis not present

## 2020-10-28 DIAGNOSIS — R197 Diarrhea, unspecified: Secondary | ICD-10-CM | POA: Diagnosis not present

## 2020-10-28 DIAGNOSIS — R110 Nausea: Secondary | ICD-10-CM | POA: Diagnosis not present

## 2020-10-28 DIAGNOSIS — I25119 Atherosclerotic heart disease of native coronary artery with unspecified angina pectoris: Secondary | ICD-10-CM | POA: Diagnosis not present

## 2020-10-28 DIAGNOSIS — I10 Essential (primary) hypertension: Secondary | ICD-10-CM | POA: Diagnosis not present

## 2020-11-02 DIAGNOSIS — M461 Sacroiliitis, not elsewhere classified: Secondary | ICD-10-CM | POA: Diagnosis not present

## 2020-11-02 DIAGNOSIS — M7918 Myalgia, other site: Secondary | ICD-10-CM | POA: Insufficient documentation

## 2020-11-02 DIAGNOSIS — M47816 Spondylosis without myelopathy or radiculopathy, lumbar region: Secondary | ICD-10-CM | POA: Diagnosis not present

## 2020-11-08 DIAGNOSIS — C44529 Squamous cell carcinoma of skin of other part of trunk: Secondary | ICD-10-CM | POA: Diagnosis not present

## 2020-11-17 DIAGNOSIS — M461 Sacroiliitis, not elsewhere classified: Secondary | ICD-10-CM | POA: Diagnosis not present

## 2020-11-25 DIAGNOSIS — Z23 Encounter for immunization: Secondary | ICD-10-CM | POA: Diagnosis not present

## 2020-12-01 ENCOUNTER — Other Ambulatory Visit: Payer: Self-pay

## 2020-12-01 ENCOUNTER — Encounter: Payer: Self-pay | Admitting: Cardiovascular Disease

## 2020-12-01 ENCOUNTER — Ambulatory Visit (INDEPENDENT_AMBULATORY_CARE_PROVIDER_SITE_OTHER): Payer: Medicare Other | Admitting: Cardiovascular Disease

## 2020-12-01 VITALS — BP 140/70 | HR 73 | Ht 72.0 in | Wt 251.2 lb

## 2020-12-01 DIAGNOSIS — I10 Essential (primary) hypertension: Secondary | ICD-10-CM

## 2020-12-01 DIAGNOSIS — I872 Venous insufficiency (chronic) (peripheral): Secondary | ICD-10-CM | POA: Diagnosis not present

## 2020-12-01 DIAGNOSIS — I739 Peripheral vascular disease, unspecified: Secondary | ICD-10-CM

## 2020-12-01 DIAGNOSIS — I2510 Atherosclerotic heart disease of native coronary artery without angina pectoris: Secondary | ICD-10-CM | POA: Diagnosis not present

## 2020-12-01 DIAGNOSIS — I6529 Occlusion and stenosis of unspecified carotid artery: Secondary | ICD-10-CM | POA: Diagnosis not present

## 2020-12-01 DIAGNOSIS — E785 Hyperlipidemia, unspecified: Secondary | ICD-10-CM

## 2020-12-01 MED ORDER — TORSEMIDE 20 MG PO TABS: 20.0000 mg | ORAL_TABLET | Freq: Every day | ORAL | 3 refills | Status: DC | PRN

## 2020-12-01 NOTE — Progress Notes (Signed)
Cardiology Office Note   Date:  12/01/2020   ID:  Derek Hogan, DOB [birth year], MRN [id number]  PCP:  Kirk Ruths, MD  Cardiologist:  Dr. Fletcher Anon  Chief Complaint  Patient presents with   Other    3 month f/u no complaints today. Meds reviewed verbally with pt.      History of Present Illness: Derek Hogan is a 75 y.o. male who presents for a follow-up visit regarding coronary artery disease and peripheral arterial disease.  He has known history of CAD S/P previous inferior wall myocardial infarction followed by CABG in 1996. He had questionable TIA in March 2016. He quit smoking in 1996 after CABG. He has prolonged history of diabetes currently on insulin pump, Carotid disease status post right carotid endarterectomy, hyperlipidemia and hypertension. He is known to have peripheral arterial disease.  He had drug-coated balloon angioplasty followed by self-expanding stent placement to the proximal right popliteal artery for severe claudication in November 2016.  He had a Lexiscan Myoview in May 2021 which showed no evidence of ischemia.   He is known to have right leg edema due to suspected venous insufficiency.  He had lower extremity venous Doppler in August 2021 which showed no evidence of DVT.  He was seen recently for worsening right lower extremity swelling.  BNP was normal.  Echocardiogram showed normal LV systolic function with grade 1 diastolic dysfunction and no significant valvular abnormalities.  I asked him to start wearing knee-high support stockings and switch furosemide to torsemide.  Swelling almost completely resolved.  His initial labs showed normal renal function but he had labs done by his primary care physician last month which showed slight worsening of creatinine at 1.5.  Since then, he cut down on torsemide to every other day. No chest pain or shortness of breath.  No claudication.   Past Medical History:  Diagnosis Date   CAD, ARTERY BYPASS  GRAFT    a. 1996 s/p CABG x 4 (LIMA->LAD, VG->D1, VG->OM3, VG->RPDA; b. 12/2016 [facility]): EF 47%, anteroapical and inf infarcts w/ anteroapical peri-infarct ischemia; c. 01/2017: Cath: LM 48md, LAD 100p/m, D1 90ost, 70p, LCX 80ost/p, 1053mRCA 4058m5d, RPDA 95ost, VG->RPDA 30p/m, VG->OM3 nl, LIMA->LAD nl, VG->D1 nl.   Carotid arterial disease ([place]CC[place]  a. s/p remote R CEA (Dr. GreDrucie Opitzb. 01/2018 Carotid U/S: RICA 1-33-14%CCA <50<97%ICA 03-06-24%/u 12 mos.   Dyslipidemia    ERECTILE DYSFUNCTION 06/09/2008   Erectile dysfunction    GERD (gastroesophageal reflux disease)    Heart attack (HCC[place]996   HYPERLIPIDEMIA-MIXED 06/09/2008   Hypertension    HYPERTENSION, UNSPECIFIED 06/09/2008   Orthostatic hypotension    PVD (peripheral vascular disease) ([place]CC[place]  a. 02/2014 s/p DBA/stent to prox R popliteal; b.  08/2017 Vasc u/s/TBI: Patent mid R pop stent. LSFA 30-40%. TBI: R 0.79, L 0.51 - no significant change, f/u 1 yr.   Transient ischemic attack (TIA)    Type I diabetes mellitus (HCCSmith Millsx'd 08/20/1966   UNSPECIFIED PERIPHERAL VASCULAR DISEASE 10/06/2008   Viral gastroenteritis    04/19/2010 improved   Weakness     Past Surgical History:  Procedure Laterality Date   CARDIAC CATHETERIZATION  1996; ~ 2010   CAROTID ENDARTERECTOMY Right    CATARACT EXTRACTION, BILATERAL Bilateral    COLONOSCOPY WITH PROPOFOL N/A 06/07/2020   Procedure: COLONOSCOPY WITH PROPOFOL;  Surgeon: LocLesly RubensteinD;  Location: [facility] ENDOSCOPY;  Service: Endoscopy;  Laterality: N/A;  CORONARY ANGIOPLASTY     CORONARY ARTERY BYPASS GRAFT  1996   "CABG X4"   ESOPHAGOGASTRODUODENOSCOPY N/A 06/07/2020   Procedure: ESOPHAGOGASTRODUODENOSCOPY (EGD);  Surgeon: Lesly Rubenstein, MD;  Location[facility]COPY;  Service: Endoscopy;  Laterality: N/A;   LEFT HEART CATH AND CORS/GRAFTS ANGIOGRAPHY N/A 01/11/2017   Procedure: LEFT HEART CATH AND CORS/GRAFTS ANGIOGRAPHY;  Surgeon: Jolaine Artist, MD;  Location: [place]  LAB;  Service: Cardiovascular;  Laterality: N/A;   MOHS SURGERY     PERIPHERAL VASCULAR CATHETERIZATION N/A 12/15/2014   Procedure: Abdominal Aortogram;  Surgeon: Wellington Hampshire, MD;  Location: [facility] LAB;  Service: Cardiovascular;  Laterality: N/A;   REFRACTIVE SURGERY Right    "before cataract OR"     Current Outpatient Medications  Medication Sig Dispense Refill   aspirin 81 MG tablet Take 162 mg by mouth at bedtime.      atorvastatin (LIPITOR) 80 MG tablet Take 1 tablet (80 mg total) by mouth daily.     Calcium Carb-Cholecalciferol 500-100 MG-UNIT CHEW CHEW 1 TABLET BY MOUTH TWO TIMES A DAY FOR BONE HEALTH     carvedilol (COREG) 3.125 MG tablet Take 3.125 mg by mouth 2 (two) times daily with a meal.     Cholecalciferol 25 MCG (1000 UT) tablet Take 1 tablet by mouth daily.     glucagon 1 MG injection INJECT 1MG KIT INTRAMUSCULARLY  AS NEEDED FOR EMERGENCY HYPOGLYCEMIA     hydrOXYzine (ATARAX/VISTARIL) 25 MG tablet Take 1 tablet by mouth daily as needed.     insulin aspart (NOVOLOG) 100 unit/mL injection Inject into the skin continuous. Insulin Pump     losartan (COZAAR) 100 MG tablet Take 100 mg by mouth daily.     methocarbamol (ROBAXIN) 500 MG tablet Take 500 mg by mouth.     nitroGLYCERIN (NITROSTAT) 0.4 MG SL tablet Place 0.4 mg under the tongue every 5 (five) minutes as needed for chest pain.      omeprazole (PRILOSEC) 20 MG capsule Take 20 mg by mouth every morning.      rivaroxaban (XARELTO) 2.5 MG TABS tablet Take 1 tablet (2.5 mg total) by mouth 2 (two) times daily. 60 tablet 6   sertraline (ZOLOFT) 100 MG tablet Take 100 mg by mouth daily.     torsemide (DEMADEX) 20 MG tablet Take 1 tablet (20 mg total) by mouth daily. 90 tablet 0   traZODone (DESYREL) 50 MG tablet Take 25 mg by mouth at bedtime.     No current facility-administered medications for this visit.    Allergies:   Lisinopril and Simvastatin    Social History:  The patient  reports that he quit  smoking about 26 years ago. His smoking use included cigarettes. He has a 87.50 pack-year smoking history. He has never used smokeless tobacco. He reports that he does not currently use alcohol. He reports that he does not use drugs.   Family History:  The patient's family history includes Coronary artery disease in his father and mother; Diabetes in his father and mother; Stroke in an other family member.    ROS:  Please see the history of present illness.   Otherwise, review of systems are positive for none.   All other systems are reviewed and negative.    PHYSICAL EXAM: VS:  BP 140/70 (BP Location: Left Arm, Patient Position: Sitting, Cuff Size: Normal)   Pulse 73   Ht 6' (1.829 m)   Wt 251 lb 4 oz (114 kg)  SpO2 97%   BMI 34.08 kg/m  , BMI Body mass index is 34.08 kg/m. GEN: Well nourished, well developed, in no acute distress  HEENT: normal  Neck: no JVD, or masses. Right carotid bruit Cardiac: RRR; no murmurs, rubs, or gallops, trace right leg swelling. Respiratory:  clear to auscultation bilaterally, normal work of breathing GI: soft, nontender, nondistended, + BS MS: no deformity or atrophy  Skin: warm and dry, no rash Neuro:  Strength and sensation are intact Psych: euthymic mood, full affect Vascular: Femoral pulse is difficult to palpate bilaterally.  EKG:  EKG is not ordered today.    Recent Labs: 09/07/2020: B Natriuretic Peptide 83.2; BUN 18; Creatinine, Ser 0.96; Potassium 4.4; Sodium 140    Lipid Panel    Component Value Date/Time   CHOL 116 07/08/2014 0830   TRIG 51 07/08/2014 0830   HDL 43 07/08/2014 0830   CHOLHDL 2.7 07/08/2014 0830   CHOLHDL 2.7 07/19/2010 0839   VLDL 10 07/19/2010 0839   LDLCALC 63 07/08/2014 0830      Wt Readings from Last 3 Encounters:  12/01/20 251 lb 4 oz (114 kg)  08/26/20 263 lb 8 oz (119.5 kg)  06/07/20 257 lb (116.6 kg)        ASSESSMENT AND PLAN:  1.  Peripheral arterial disease: Status post angioplasty and  stent placement to the right popliteal artery.  The patient is overall stable with no calf claudication.  Continue medical therapy.  He is on aspirin and low-dose Xarelto. Most recent Doppler studies in April showed patent right popliteal artery stent with no significant aortoiliac disease.  2. Coronary artery disease involving native coronary arteries without angina: He continues to be asymptomatic. Continue medical therapy.    3. Hyperlipidemia: Continue treatment with high dose atorvastatin with a target LDL of less than 70.   4. Carotid disease status post right carotid endarterectomy: Carotid Doppler in January of last year showed no obstructive disease.  5.  Chronic venous insufficiency affecting the right lower extremity: Improved significantly after switching to torsemide and using knee-high support stockings.  Given slight worsening of renal function, I asked him to start taking torsemide as needed.     Disposition:   FU with me in 6 months  Signed,  Kathlyn Sacramento, MD  12/01/2020 8:11 AM    [place]

## 2020-12-01 NOTE — Patient Instructions (Signed)
Medication Instructions:  Torsemide 20 mg daily As needed *If you need a refill on your cardiac medications before your next appointment, please call your pharmacy*   Lab Work: None  Testing/Procedures: None  Follow-Up: At [facility], you and your health needs are our priority.  As part of our continuing mission to provide you with exceptional heart care, we have created designated Provider Care Teams.  These Care Teams include your primary Cardiologist (physician) and Advanced Practice Providers (APPs -  Physician Assistants and Nurse Practitioners) who all work together to provide you with the care you need, when you need it.   Your next appointment:   6 month(s)  The format for your next appointment:   In Person  Provider:   You may see Kathlyn Sacramento, MD or one of the following Advanced Practice Providers on your designated Care Team:   Murray Hodgkins, NP Christell Faith, PA-C Cadence Kathlen Mody, [place]

## 2021-01-12 DIAGNOSIS — Z79899 Other long term (current) drug therapy: Secondary | ICD-10-CM | POA: Diagnosis not present

## 2021-01-12 DIAGNOSIS — M461 Sacroiliitis, not elsewhere classified: Secondary | ICD-10-CM | POA: Diagnosis not present

## 2021-01-25 ENCOUNTER — Telehealth: Payer: Self-pay | Admitting: Cardiovascular Disease

## 2021-01-25 NOTE — Telephone Encounter (Signed)
Pt c/o Shortness Of Breath: STAT if SOB developed within the last 24 hours or pt is noticeably SOB on the phone  1. Are you currently SOB (can you hear that pt is SOB on the phone)? no  2. How long have you been experiencing SOB? Since Sunday   3. Are you SOB when sitting or when up moving around? Both   4. Are you currently experiencing any other symptoms? Tingling in left hand

## 2021-01-25 NOTE — Telephone Encounter (Signed)
Returned the patients call.  Patient sts that he does not feel his sob is related to fluid retention but instead deconditioning.  He is taking his torsemide every other day and he denies increased LE swelling. Pt sts that his chronic LE edema is stable.  Pt denies chest pain, n/v, palpitations, orthopnea, pnd. Patient sts that he is more concerned about the tingling in his left hand. He denies left arm pain and  the tingling in his hand does not radiate. He denies injury to the hand. Adv the patient that his symptoms are difficult to evaluate over the phone. Offered the patient an appt to be seen. Patient declined an appt at this time. Pt sts that he will continue to monitor his symptoms.  Adv the patient to contact the office for an appt if his symptoms persist or worsen. Pt given ER precautions.  Patient verbalized understanding and voiced appreciation for the call back.

## 2021-02-08 ENCOUNTER — Other Ambulatory Visit: Payer: Self-pay

## 2021-02-08 ENCOUNTER — Ambulatory Visit (INDEPENDENT_AMBULATORY_CARE_PROVIDER_SITE_OTHER): Payer: Medicare Other | Admitting: Podiatry

## 2021-02-08 DIAGNOSIS — L02612 Cutaneous abscess of left foot: Secondary | ICD-10-CM

## 2021-02-08 DIAGNOSIS — L03032 Cellulitis of left toe: Secondary | ICD-10-CM

## 2021-02-08 MED ORDER — CEPHALEXIN 500 MG PO CAPS: 500.0000 mg | ORAL_CAPSULE | Freq: Three times a day (TID) | ORAL | 0 refills | Status: DC

## 2021-02-13 ENCOUNTER — Encounter: Payer: Self-pay | Admitting: Podiatry

## 2021-02-13 LAB — WOUND CULTURE: Organism ID, Bacteria: NONE SEEN

## 2021-02-13 NOTE — Progress Notes (Signed)
°  Subjective:  Patient ID: Derek Hogan, male    DOB: 03/16/1945,  MRN: [id number]  Chief Complaint  Patient presents with   Toe Pain    Patient presents today for sore/blister on tip of left 2nd toe and painful crack on left heel    76 y.o. male presents with the above complaint. History confirmed with patient.   Objective:  Physical Exam: warm, good capillary refill, no trophic changes or ulcerative lesions, normal DP and PT pulses, and normal sensory exam. Left Foot: Second toe DIPJ has a hemorrhagic bulla with purulent drainage on debridement, no cellulitis or major edema  Assessment:   1. Cellulitis and abscess of toe of left foot      Plan:  Patient was evaluated and treated and all questions answered.   Has a small infected hemorrhagic blister of the tip of the toe.  Expect this to resolve uneventfully.  Was going to prescribe him cephalexin but he has doxycycline at home already he will finish this.  Wound culture of the drainage was taken.  Return in about 2 weeks (around 02/22/2021) for wound care.

## 2021-02-22 ENCOUNTER — Ambulatory Visit: Payer: Medicare Other | Admitting: Podiatry

## 2021-04-12 ENCOUNTER — Encounter: Payer: Self-pay | Admitting: Podiatry

## 2021-04-12 ENCOUNTER — Other Ambulatory Visit: Payer: Self-pay

## 2021-04-12 ENCOUNTER — Ambulatory Visit (INDEPENDENT_AMBULATORY_CARE_PROVIDER_SITE_OTHER): Payer: Medicare Other | Admitting: Podiatry

## 2021-04-12 DIAGNOSIS — E11628 Type 2 diabetes mellitus with other skin complications: Secondary | ICD-10-CM

## 2021-04-12 DIAGNOSIS — L97521 Non-pressure chronic ulcer of other part of left foot limited to breakdown of skin: Secondary | ICD-10-CM | POA: Diagnosis not present

## 2021-04-12 DIAGNOSIS — I739 Peripheral vascular disease, unspecified: Secondary | ICD-10-CM | POA: Diagnosis not present

## 2021-04-12 DIAGNOSIS — L03032 Cellulitis of left toe: Secondary | ICD-10-CM | POA: Diagnosis not present

## 2021-04-12 DIAGNOSIS — L089 Local infection of the skin and subcutaneous tissue, unspecified: Secondary | ICD-10-CM

## 2021-04-12 DIAGNOSIS — L02612 Cutaneous abscess of left foot: Secondary | ICD-10-CM

## 2021-04-12 MED ORDER — MUPIROCIN 2 % EX OINT: 1.0000 "application " | TOPICAL_OINTMENT | Freq: Two times a day (BID) | CUTANEOUS | 2 refills | Status: DC

## 2021-04-12 MED ORDER — CEPHALEXIN 500 MG PO CAPS: 500.0000 mg | ORAL_CAPSULE | Freq: Three times a day (TID) | ORAL | 0 refills | Status: DC

## 2021-04-12 NOTE — Progress Notes (Signed)
?  Subjective:  ?Patient ID: Derek Hogan, male    DOB: 10-13-1945,  MRN: [id number] ? ?Chief Complaint  ?Patient presents with  ? Nail Problem  ?  "I had a pedicure about a month ago.  I thought it was going to get better but it didn't."  ? ? ?76 y.o. male presents with the above complaint. History confirmed with patient.  He has a new issue that is developed on the medial right great toe over the swollen red and is painful ? ?Objective:  ?Physical Exam: ?Partial-thickness skin breakdown with ulceration to the medial nail fold of the left hallux, cellulitis to the IPJ, no purulence or malodor no exposed bone tendon or joint.  Difficult to palpate pedal pulses.  Capillary fill time is sluggish. ? ?Assessment:  ? ?1. Ulcer of great toe, left, limited to breakdown of skin ([place])   ?2. Cellulitis and abscess of toe of left foot   ?3. PAD (peripheral artery disease) ([place])   ?4. Diabetic foot infection ([place])   ? ? ? ?Plan:  ?Patient was evaluated and treated and all questions answered. ? ?I discussed with him the seriousness of an ulceration in his left hallux.  He appears to have cellulitis secondary to this as well.  I placed him on Keflex 3 times daily for 10 days and prescribe mupirocin ointment.  I discussed with him that his A1c is elevated at over 7% and this combined with his history of peripheral arterial disease could put him at high risk of limb loss.  I would like to see him back in 2 weeks for follow-up and reevaluation.  Discussed return precautions for the office and emergency room if any of the following develop: Severe redness swelling purulence nausea fevers chills vomiting. ? ?Return in about 2 weeks (around 04/26/2021) for wound check.  ? ?

## 2021-04-26 ENCOUNTER — Other Ambulatory Visit: Payer: Self-pay

## 2021-04-26 ENCOUNTER — Ambulatory Visit (INDEPENDENT_AMBULATORY_CARE_PROVIDER_SITE_OTHER): Payer: Medicare Other | Admitting: Podiatry

## 2021-04-26 DIAGNOSIS — I739 Peripheral vascular disease, unspecified: Secondary | ICD-10-CM

## 2021-04-26 DIAGNOSIS — L97521 Non-pressure chronic ulcer of other part of left foot limited to breakdown of skin: Secondary | ICD-10-CM | POA: Diagnosis not present

## 2021-04-26 NOTE — Progress Notes (Signed)
?  Subjective:  ?Patient ID: Derek Hogan, male    DOB: 10-19-1945,  MRN: [id number] ? ?Chief Complaint  ?Patient presents with  ? Foot Ulcer  ?     2 weeks wound check left great toe  ? ? ?76 y.o. male returns for follow-up with the above complaint. History confirmed with patient.  He took the antibiotics and this is improved but is still painful and tender ? ?Objective:  ?Physical Exam: ?Partial-thickness skin breakdown to the medial nail fold of the left hallux, no longer has any cellulitis no purulence or malodor no exposed bone tendon or joint.  Difficult to palpate pedal pulses.  Capillary fill time is sluggish. ? ?Assessment:  ? ?1. PAD (peripheral artery disease) ([place])   ?2. Ulcer of great toe, left, limited to breakdown of skin ([place])   ? ? ? ?Plan:  ?Patient was evaluated and treated and all questions answered. ? ?He is improved with oral antibiotics.  I recommend he continue to do twice daily soaks with Epsom salt and warm water and apply antibiotic ointment.  I recommended evaluation with noninvasive vascular testing and this was sent to [facility] in [place], he is known to Dr. Fletcher Anon there.  I will see him back after his testing of reevaluation in a few weeks he may require partial permanent nail avulsion ? ?Return in about 3 weeks (around 05/17/2021) for ingrowing nail .  ? ?

## 2021-04-26 NOTE — Patient Instructions (Signed)
Call [phone] to schedule your testing ?

## 2021-05-02 ENCOUNTER — Ambulatory Visit (INDEPENDENT_AMBULATORY_CARE_PROVIDER_SITE_OTHER): Payer: Medicare Other | Admitting: Physician Assistant

## 2021-05-02 ENCOUNTER — Ambulatory Visit (INDEPENDENT_AMBULATORY_CARE_PROVIDER_SITE_OTHER): Payer: Medicare Other

## 2021-05-02 ENCOUNTER — Other Ambulatory Visit: Payer: Self-pay

## 2021-05-02 ENCOUNTER — Encounter: Payer: Self-pay | Admitting: Physician Assistant

## 2021-05-02 VITALS — BP 130/54 | HR 71 | Ht 72.0 in | Wt 264.0 lb

## 2021-05-02 DIAGNOSIS — I2510 Atherosclerotic heart disease of native coronary artery without angina pectoris: Secondary | ICD-10-CM

## 2021-05-02 DIAGNOSIS — I10 Essential (primary) hypertension: Secondary | ICD-10-CM

## 2021-05-02 DIAGNOSIS — I6521 Occlusion and stenosis of right carotid artery: Secondary | ICD-10-CM

## 2021-05-02 DIAGNOSIS — E785 Hyperlipidemia, unspecified: Secondary | ICD-10-CM

## 2021-05-02 DIAGNOSIS — M7989 Other specified soft tissue disorders: Secondary | ICD-10-CM

## 2021-05-02 DIAGNOSIS — I739 Peripheral vascular disease, unspecified: Secondary | ICD-10-CM | POA: Diagnosis not present

## 2021-05-02 DIAGNOSIS — R42 Dizziness and giddiness: Secondary | ICD-10-CM

## 2021-05-02 DIAGNOSIS — T148XXA Other injury of unspecified body region, initial encounter: Secondary | ICD-10-CM

## 2021-05-02 DIAGNOSIS — I5033 Acute on chronic diastolic (congestive) heart failure: Secondary | ICD-10-CM | POA: Diagnosis not present

## 2021-05-02 DIAGNOSIS — I872 Venous insufficiency (chronic) (peripheral): Secondary | ICD-10-CM

## 2021-05-02 DIAGNOSIS — R2681 Unsteadiness on feet: Secondary | ICD-10-CM

## 2021-05-02 NOTE — Progress Notes (Addendum)
? ?Cardiology Office Note   ? ?Date:  05/02/2021  ? ?ID:  Derek Hogan, DOB 11-Dec-1945, MRN [id number] ? ?PCP:  Kirk Ruths, MD  ?Cardiologist:  Kathlyn Sacramento, MD  ?Electrophysiologist:  None  ? ?Chief Complaint: Gait unsteadiness, lower extremity swelling, review sleep study ? ?History of Present Illness:  ? ?Derek Hogan is a 76 y.o. male with history of CAD with prior inferior MI status post 4-vessel CABG in 1996, PAD status post drug-coated balloon angioplasty followed by self-expanding stenting to the proximal right popliteal artery for severe claudication in 12/2014, carotid artery disease status post right-sided CEA, insulin-dependent diabetes, possible TIA in 04/2014, HTN, HLD, known right lower extremity swelling due to suspected venous insufficiency with negative lower extremity venous Doppler in 09/2019, lumbar spinal stenosis with radiculopathy, obesity, and prior tobacco use quitting in 1996 after his CABG who presents for evaluation of the above. ? ?LHC in 2018 showed multivessel native CAD with patent LIMA to LAD, SVG to D1, SVG to OM 3, and 30% proximal to mid SVG to RPDA stenosis.  Most recent ischemic evaluation via [facility] in 06/2019 showed no evidence of ischemia. ? ?He was seen in 08/2020 with worsening right lower extremity swelling.  BNP was normal.  Echo in 09/2020 demonstrated an EF of 55 to 60%, mild LVH, grade 1 diastolic dysfunction, normal RV systolic function and ventricular cavity size, mildly dilated left atrium, and mild aortic valve sclerosis without evidence of stenosis.  It was recommended he start wearing knee-high compression stockings and furosemide was transitioned to torsemide.  He was seen in follow-up in 11/2020 it was swelling almost completely resolved.  However, he was noted to have a degree of mild AKI leading his torsemide to be decreased to every other day.  He was without symptoms of angina or decompensation. ? ?With regards to his PAD, noninvasive  imaging in 05/2020 showed stable ABIs with patent right popliteal stent and no evidence of aortic aneurysm or iliac disease.  More recently, he has been followed by podiatry in 04/2021 with a right great toe ulcer that was noted after a pedicure.  He was placed on Keflex with noted improvement at his last visit with them on 04/26/2021.  They have ordered ABIs, which are scheduled for 06/07/2021. ? ?He comes in today with several concerns. ? ?He recently underwent a home sleep study through the [place] [facility] which demonstrated mild OSA.  The report also indicated a significantly variable heart rate while sleeping with a minimum pulse of 57, maximum pulse of 179, and average heart rate of 71 bpm.  However, upon reviewing the supplied graph with these heart rates, I do not see evidence of any significant tachycardic rates.  He has not yet heard back from the [place] regarding the sleep study results. ? ?He also notes progressive swelling involving the left lower extremity.  At baseline, he has chronic right lower extremity swelling and wears a compression stocking on this leg.  However, over the past several weeks to month he has noted progressive swelling involving the left lower extremity.  No associated abdominal distention, orthopnea, PND, or early satiety.  He does not add salt to food and drinks less than 2 L of liquid per day.  He has been taking torsemide 20 mg on a as needed basis secondary to cramping.  He indicates he typically takes this 2 to 3 days/week and when he notes swelling in the right lower extremity following removal of his  compression stocking.  He last took a as needed torsemide 5 to 6 days ago.  No symptoms of chest pain. ? ?He also notes intermittent unsteadiness, particularly if he rotates his head.  He does not describe this as dizziness.  No presyncope or syncope.  No falls.  No associated palpitations. ? ?His left great toe wound does not appear to be infected any longer, though this wound has  not resolved over the past couple of months. ? ? ?Labs independently reviewed: ?03/2021 ([place]) - A1c 7.7, TC 144, TG 94, HDL 46, LDL 88, serum creatinine 1.0, BUN 16, potassium 4.6, Hgb 13.2, PLT 249 ?10/2020 - Hgb 12.6, PLT 224, potassium 4.8, BUN 32, serum creatinine 1.5, albumin 4.1, AST/ALT normal ?04/2020 - A1c 8.2, TC 137, TG 150, HDL 39, LDL 68 ? ?Past Medical History:  ?Diagnosis Date  ? CAD, ARTERY BYPASS GRAFT   ? a. 1996 s/p CABG x 4 (LIMA->LAD, VG->D1, VG->OM3, VG->RPDA; b. 12/2016 [facility]): EF 47%, anteroapical and inf infarcts w/ anteroapical peri-infarct ischemia; c. 01/2017: Cath: LM 44md, LAD 100p/m, D1 90ost, 70p, LCX 80ost/p, 1027mRCA 4027m5d, RPDA 95ost, VG->RPDA 30p/m, VG->OM3 nl, LIMA->LAD nl, VG->D1 nl.  ? Carotid arterial disease (HCC[place]oulds[place] ? a. s/p remote R CEA (Dr. GreDrucie Opitzb. 01/2018 Carotid U/S: RICA 03-08-59%CCA <50<09%ICA 1-36-04%/u 12 mos.  ? Dyslipidemia   ? ERECTILE DYSFUNCTION 06/09/2008  ? Erectile dysfunction   ? GERD (gastroesophageal reflux disease)   ? Heart attack (HCCHouserville996  ? HYPERLIPIDEMIA-MIXED 06/09/2008  ? Hypertension   ? HYPERTENSION, UNSPECIFIED 06/09/2008  ? Orthostatic hypotension   ? PVD (peripheral vascular disease) ([place]CCDec[place] ? a. 02/2014 s/p DBA/stent to prox R popliteal; b.  08/2017 Vasc u/s/TBI: Patent mid R pop stent. LSFA 30-40%. TBI: R 0.79, L 0.51 - no significant change, f/u 1 yr.  ? Transient ischemic attack (TIA)   ? Type I diabetes mellitus (HCCKunax'd 08/20/1966  ? UNSPECIFIED PERIPHERAL VASCULAR DISEASE 10/06/2008  ? Viral gastroenteritis   ? 04/19/2010 improved  ? Weakness   ? ? ?Past Surgical History:  ?Procedure Laterality Date  ? [place]  ? CAROTID ENDARTERECTOMY Right   ? CATARACT EXTRACTION, BILATERAL Bilateral   ? COLONOSCOPY WITH PROPOFOL N/A 06/07/2020  ? Procedure: COLONOSCOPY WITH PROPOFOL;  Surgeon: LocLesly RubensteinD;  [facility];  Service: Endoscopy;  Laterality: N/A;  ? CORONARY ANGIOPLASTY     ? CORONARY ARTERY BYPASS GRAFT  1996  ? "CABG X4"  ? ESOPHAGOGASTRODUODENOSCOPY N/A 06/07/2020  ? Procedure: ESOPHAGOGASTRODUODENOSCOPY (EGD);  Surgeon: LocLesly RubensteinD;  [facility];  Service: Endoscopy;  Laterality: N/A;  ? LEFT HEART CATH AND CORS/GRAFTS ANGIOGRAPHY N/A 01/11/2017  ? Procedure: LEFT HEART CATH AND CORS/GRAFTS ANGIOGRAPHY;  Surgeon: BenJolaine ArtistD;  Location[facility];  Service: Cardiovascular;  Laterality: N/A;  ? MOHS SURGERY    ? PERIPHERAL VASCULAR CATHETERIZATION N/A 12/15/2014  ? Procedure: Abdominal Aortogram;  Surgeon: MuhWellington HampshireD;  Location: [facility];  Service: Cardiovascular;  Laterality: N/A;  ? REFRACTIVE SURGERY Right   ? "before cataract OR"  ? ? ?Current Medications: ?Current Meds  ?Medication Sig  ? aspirin 81 MG tablet Take 162 mg by mouth at bedtime.   ? atorvastatin (LIPITOR) 80 MG tablet Take 1 tablet (80 mg total) by mouth daily.  ? Calcium Carb-Cholecalciferol 500-100 MG-UNIT CHEW CHEW 1 TABLET BY MOUTH  TWO TIMES A DAY FOR BONE HEALTH  ? carvedilol (COREG) 3.125 MG tablet Take 3.125 mg by mouth 2 (two) times daily with a meal.  ? Cholecalciferol 25 MCG (1000 UT) tablet Take 1 tablet by mouth daily.  ? doxycycline (MONODOX) 100 MG capsule Take 100 mg by mouth 2 (two) times daily.  ? glucagon 1 MG injection INJECT 1MG KIT INTRAMUSCULARLY  AS NEEDED FOR EMERGENCY HYPOGLYCEMIA  ? hydrOXYzine (ATARAX/VISTARIL) 25 MG tablet Take 1 tablet by mouth daily as needed.  ? insulin aspart (NOVOLOG) 100 unit/mL injection Inject into the skin continuous. Insulin Pump  ? losartan (COZAAR) 100 MG tablet Take 100 mg by mouth daily.  ? methocarbamol (ROBAXIN) 500 MG tablet Take 500 mg by mouth.  ? mupirocin ointment (BACTROBAN) 2 % Apply 1 application. topically 2 (two) times daily.  ? nitroGLYCERIN (NITROSTAT) 0.4 MG SL tablet Place 0.4 mg under the tongue every 5 (five) minutes as needed for chest pain.   ? omeprazole (PRILOSEC) 20 MG  capsule Take 20 mg by mouth every morning.   ? rivaroxaban (XARELTO) 2.5 MG TABS tablet Take 1 tablet (2.5 mg total) by mouth 2 (two) times daily.  ? sertraline (ZOLOFT) 100 MG tablet Take 100 mg by mou

## 2021-05-02 NOTE — Patient Instructions (Addendum)
Medication Instructions:  ?Your physician has recommended you make the following change in your medication:  ? ?TAKE Torsemide 20 mg once daily for 5 days THEN take every other day ? ? ?*If you need a refill on your cardiac medications before your next appointment, please call your pharmacy* ? ? ?Lab Work: ?BMET in one week.  ? ?If you have labs (blood work) drawn today and your tests are completely normal, you will receive your results only by: ?MyChart Message (if you have MyChart) OR ?A paper copy in the mail ?If you have any lab test that is abnormal or we need to change your treatment, we will call you to review the results. ? ? ?Testing/Procedures: ?Your physician has requested that you have an echocardiogram. Echocardiography is a painless test that uses sound waves to create images of your heart. It provides your doctor with information about the size and shape of your heart and how well your heart?s chambers and valves are working. This procedure takes approximately one hour. There are no restrictions for this procedure. ? ?Your physician has requested that you have a lower or upper extremity arterial duplex. Please try to schedule same time as his ABI.  ? ?This test is an ultrasound of the arteries in the legs or arms. It looks at arterial blood flow in the legs and arms. Allow one hour for Lower and Upper Arterial scans. There are no restrictions or special instructions ? ?Your physician has requested that you have a carotid duplex. This test is an ultrasound of the carotid arteries in your neck. It looks at blood flow through these arteries that supply the brain with blood. Allow one hour for this exam. There are no restrictions or special instructions. ? ?Your provider has ordered a heart monitor to wear for 14 days. This will be mailed to your home with instructions on placement. Once you have finished the time frame requested, you will return monitor in box provided. ? ? ? ? ? ?Follow-Up: ?At [facility], you and your health needs are our priority.  As part of our continuing mission to provide you with exceptional heart care, we have created designated Provider Care Teams.  These Care Teams include your primary Cardiologist (physician) and Advanced Practice Providers (APPs -  Physician Assistants and Nurse Practitioners) who all work together to provide you with the care you need, when you need it. ? ? ?Your next appointment:   ?2 month(s) ? ?The format for your next appointment:   ?In Person ? ?Provider:   ?Kathlyn [place], MD or Christell Faith, PA-C ?

## 2021-05-04 DIAGNOSIS — R42 Dizziness and giddiness: Secondary | ICD-10-CM | POA: Diagnosis not present

## 2021-05-09 ENCOUNTER — Other Ambulatory Visit (INDEPENDENT_AMBULATORY_CARE_PROVIDER_SITE_OTHER): Payer: Medicare Other

## 2021-05-09 DIAGNOSIS — T148XXA Other injury of unspecified body region, initial encounter: Secondary | ICD-10-CM

## 2021-05-09 DIAGNOSIS — R2681 Unsteadiness on feet: Secondary | ICD-10-CM

## 2021-05-09 DIAGNOSIS — I739 Peripheral vascular disease, unspecified: Secondary | ICD-10-CM | POA: Diagnosis not present

## 2021-05-09 DIAGNOSIS — M7989 Other specified soft tissue disorders: Secondary | ICD-10-CM

## 2021-05-09 DIAGNOSIS — I6521 Occlusion and stenosis of right carotid artery: Secondary | ICD-10-CM

## 2021-05-09 DIAGNOSIS — I2510 Atherosclerotic heart disease of native coronary artery without angina pectoris: Secondary | ICD-10-CM

## 2021-05-10 LAB — BASIC METABOLIC PANEL
BUN/Creatinine Ratio: 17 (ref 10–24)
BUN: 16 mg/dL (ref 8–27)
CO2: 22 mmol/L (ref 20–29)
Calcium: 8.9 mg/dL (ref 8.6–10.2)
Chloride: 105 mmol/L (ref 96–106)
Creatinine, Ser: 0.93 mg/dL (ref 0.76–1.27)
Glucose: 126 mg/dL — ABNORMAL HIGH (ref 70–99)
Potassium: 4.9 mmol/L (ref 3.5–5.2)
Sodium: 140 mmol/L (ref 134–144)
eGFR: 85 mL/min/{1.73_m2} (ref >59)

## 2021-05-17 ENCOUNTER — Ambulatory Visit (INDEPENDENT_AMBULATORY_CARE_PROVIDER_SITE_OTHER): Payer: Medicare Other | Admitting: Podiatry

## 2021-05-17 ENCOUNTER — Encounter: Payer: Self-pay | Admitting: Podiatry

## 2021-05-17 DIAGNOSIS — I739 Peripheral vascular disease, unspecified: Secondary | ICD-10-CM

## 2021-05-17 DIAGNOSIS — L97521 Non-pressure chronic ulcer of other part of left foot limited to breakdown of skin: Secondary | ICD-10-CM | POA: Diagnosis not present

## 2021-05-17 NOTE — Progress Notes (Signed)
?  Subjective:  ?Patient ID: Derek Hogan, male    DOB: [birth year],  MRN: [id number] ? ?Chief Complaint  ?Patient presents with  ? Wound Check  ?  "Its doing worse.  Its painful and the nail seems to be coming off"  ? ? ?76 y.o. male returns for follow-up with the above complaint. History confirmed with patient.  Still having quite a bit of pain in the right hallux, has not had his vascular testing completed yet ? ?Objective:  ?Physical Exam: ?Partial-thickness skin breakdown to the medial nail fold of the left hallux, no longer has any cellulitis no purulence or malodor no exposed bone tendon or joint.  Difficult to palpate pedal pulses.  Capillary fill time is sluggish. ? ?Assessment:  ? ?1. Ulcer of great toe, left, limited to breakdown of skin ([place])   ?2. PAD (peripheral artery disease) ([place])   ? ? ? ?Plan:  ?Patient was evaluated and treated and all questions answered. ? ?Vascular testing scheduled for next week.  I will reach out to Dr. Fletcher Anon and discussed this case with him.  I would like to know if his perfusion is adequate he will a partial permanent matricectomy prior to proceeding. ? ?Return in about 2 weeks (around 05/31/2021) for wound check / nail removal .  ? ?

## 2021-05-22 ENCOUNTER — Ambulatory Visit
Admission: EM | Admit: 2021-05-22 | Discharge: 2021-05-22 | Disposition: A | Discharge status: Home / Self Care | Payer: Medicare Other | Attending: Emergency Medicine | Admitting: Emergency Medicine

## 2021-05-22 ENCOUNTER — Encounter: Payer: Self-pay | Admitting: Emergency Medicine

## 2021-05-22 DIAGNOSIS — S61411A Laceration without foreign body of right hand, initial encounter: Secondary | ICD-10-CM | POA: Diagnosis not present

## 2021-05-22 DIAGNOSIS — Z23 Encounter for immunization: Secondary | ICD-10-CM | POA: Diagnosis not present

## 2021-05-22 MED ORDER — TETANUS-DIPHTH-ACELL PERTUSSIS 5-2.5-18.5 LF-MCG/0.5 IM SUSY
0.5000 mL | PREFILLED_SYRINGE | Freq: Once | INTRAMUSCULAR | Status: AC
  Administered 2021-05-22: 0.5 mL via INTRAMUSCULAR

## 2021-05-22 NOTE — Discharge Instructions (Addendum)
Your tetanus was updated today.   ? ?Your stitches need to be removed in 7 to 10 days.   ? ?Keep your wound clean and dry.  Wash it gently twice a day with soap and water.  For the next 2 days, apply an antibiotic ointment and bandage.  After that, allow the wound to be open to air.   ? ?Return immediately if you note signs of infection such as redness, pus, fever, or other concerns.   ? ? ?

## 2021-05-22 NOTE — ED Triage Notes (Signed)
Pt presents with a laceration to his right hand with a tool today. Last tetanus 7 years ago  ?

## 2021-05-22 NOTE — ED Provider Notes (Signed)
?[facility] ? ? ? ?CSN: [id number] ?Arrival date & time: 05/22/21  1808 ? ? ?  ? ?History   ?Chief Complaint ?Chief Complaint  ?Patient presents with  ? Hand Problem  ?  Cut my hand - Entered by patient  ? ? ?HPI ?Derek Hogan is a 76 y.o. male.  Patient presents with a laceration on his right palm that occurred today when he cut his hand with a tool.  Bleeding controlled with direct pressure.  Patient is on Xarelto.  He denies numbness, weakness, paresthesias, or other symptoms.  Last tetanus 7 years ago.  His medical history includes diabetes, hypertension, CAD, MI, carotid stenosis, TIA, PVD.   ? ? ?The history is provided by the patient and medical records.  ? ?Past Medical History:  ?Diagnosis Date  ? CAD, ARTERY BYPASS GRAFT   ? a. 1996 s/p CABG x 4 (LIMA->LAD, VG->D1, VG->OM3, VG->RPDA; b. 12/2016 MV [facility]): EF 47%, anteroapical and inf infarcts w/ anteroapical peri-infarct ischemia; c. 01/2017: Cath: LM 20md, LAD 100p/m, D1 90ost, 70p, LCX 80ost/p, 1040mRCA 4066m5d, RPDA 95ost, VG->RPDA 30p/m, VG->OM3 nl, LIMA->LAD nl, VG->D1 nl.  ? Carotid arterial disease ([place]CC[place] ? a. s/p remote R CEA (Dr. GreDrucie Opitzb. 01/2018 Carotid U/S: RICA 1-39-67%CCA <50<89%ICA 1-33-81%/u 12 mos.  ? Dyslipidemia   ? ERECTILE DYSFUNCTION 06/09/2008  ? Erectile dysfunction   ? GERD (gastroesophageal reflux disease)   ? Heart attack ([place]  ? HYPERLIPIDEMIA-MIXED 06/09/2008  ? Hypertension   ? HYPERTENSION, UNSPECIFIED 06/09/2008  ? Orthostatic hypotension   ? PVD (peripheral vascular disease) ([place]CC[place] ? a. 02/2014 s/p DBA/stent to prox R popliteal; b.  08/2017 Vasc u/s/TBI: Patent mid R pop stent. LSFA 30-40%. TBI: R 0.79, L 0.51 - no significant change, f/u 1 yr.  ? Transient ischemic attack (TIA)   ? Type I diabetes mellitus (HCCHuntlandx'd 08/20/1966  ? UNSPECIFIED PERIPHERAL VASCULAR DISEASE 10/06/2008  ? Viral gastroenteritis   ? 04/19/2010 improved  ? Weakness   ? ? ?Patient Active Problem List  ? Diagnosis Date Noted  ?  Contusion of foot 07/31/2018  ? Peripheral vascular angioplasty status   ? PAD (peripheral artery disease) (HCC[place]1/08/2014  ? Malaise and fatigue 06/16/2012  ? Cough 12/16/2011  ? Precordial pain 01/05/2011  ? Syncope 07/25/2010  ? Leukocytosis 07/25/2010  ? DIZZINESS 05/18/2009  ? MYOCARDIAL PERFUSION SCAN, WITH STRESS TEST, ABNORMAL 12/20/2008  ? SHORTNESS OF BREATH 11/25/2008  ? Diabetes mellitus, insulin dependent (IDDM), controlled 10/01/2008  ? HYPOTENSION, ORTHOSTATIC 07/28/2008  ? Hyperlipidemia LDL goal <70 06/09/2008  ? ERECTILE DYSFUNCTION 06/09/2008  ? Essential hypertension 06/09/2008  ? Coronary atherosclerosis of native coronary artery 06/09/2008  ? Carotid stenosis 06/09/2008  ? HYPOTENSION, UNSPECIFIED 06/09/2008  ? ? ?Past Surgical History:  ?Procedure Laterality Date  ? [place]  ? CAROTID ENDARTERECTOMY Right   ? CATARACT EXTRACTION, BILATERAL Bilateral   ? COLONOSCOPY WITH PROPOFOL N/A 06/07/2020  ? Procedure: COLONOSCOPY WITH PROPOFOL;  Surgeon: LocLesly RubensteinD;  [facility];  Service: Endoscopy;  Laterality: N/A;  ? CORONARY ANGIOPLASTY    ? CORONARY ARTERY BYPASS GRAFT  1996  ? "CABG X4"  ? ESOPHAGOGASTRODUODENOSCOPY N/A 06/07/2020  ? Procedure: ESOPHAGOGASTRODUODENOSCOPY (EGD);  Surgeon: LocLesly RubensteinD;  [facility];  Service: Endoscopy;  Laterality: N/A;  ? LEFT HEART CATH AND CORS/GRAFTS ANGIOGRAPHY N/A 01/11/2017  ? Procedure: LEFT HEART CATH AND CORS/GRAFTS  ANGIOGRAPHY;  Surgeon: Jolaine Artist, MD;  Location: [place] CV LAB;  Service: Cardiovascular;  Laterality: N/A;  ? MOHS SURGERY    ? PERIPHERAL VASCULAR CATHETERIZATION N/A 12/15/2014  ? Procedure: Abdominal Aortogram;  Surgeon: Wellington Hampshire, MD;  Location: [place] CV LAB;  Service: Cardiovascular;  Laterality: N/A;  ? REFRACTIVE SURGERY Right   ? "before cataract OR"  ? ? ? ? ? ?Home Medications   ? ?Prior to Admission medications   ?Medication Sig  Start Date End Date Taking? Authorizing Provider  ?aspirin 81 MG tablet Take 162 mg by mouth at bedtime.     [provider]  ?atorvastatin (LIPITOR) 80 MG tablet Take 1 tablet (80 mg total) by mouth daily. 05/23/11   Bensimhon, Shaune Pascal, MD  ?Calcium Carb-Cholecalciferol 500-100 MG-UNIT CHEW CHEW 1 TABLET BY MOUTH TWO TIMES A DAY FOR BONE HEALTH 03/18/20   [provider]  ?carvedilol (COREG) 3.125 MG tablet Take 3.125 mg by mouth 2 (two) times daily with a meal.    [provider]  ?Cholecalciferol 25 MCG (1000 UT) tablet Take 1 tablet by mouth daily. 12/18/19   [provider]  ?doxycycline (MONODOX) 100 MG capsule Take 100 mg by mouth 2 (two) times daily.    [provider]  ?glucagon 1 MG injection INJECT 1MG KIT INTRAMUSCULARLY  AS NEEDED FOR EMERGENCY HYPOGLYCEMIA 03/18/20   [provider]  ?hydrOXYzine (ATARAX/VISTARIL) 25 MG tablet Take 1 tablet by mouth daily as needed. 09/30/19   [provider]  ?insulin aspart (NOVOLOG) 100 unit/mL injection Inject into the skin continuous. Insulin Pump    [provider]  ?losartan (COZAAR) 100 MG tablet Take 100 mg by mouth daily.    [provider]  ?methocarbamol (ROBAXIN) 500 MG tablet Take 500 mg by mouth.    [provider]  ?mupirocin ointment (BACTROBAN) 2 % Apply 1 application. topically 2 (two) times daily. 04/12/21   McDonald, Stephan Minister, DPM  ?nitroGLYCERIN (NITROSTAT) 0.4 MG SL tablet Place 0.4 mg under the tongue every 5 (five) minutes as needed for chest pain.  09/21/15   [provider]  ?omeprazole (PRILOSEC) 20 MG capsule Take 20 mg by mouth every morning.     [provider]  ?rivaroxaban (XARELTO) 2.5 MG TABS tablet Take 1 tablet (2.5 mg total) by mouth 2 (two) times daily. 06/03/19   Bensimhon, Shaune Pascal, MD  ?sertraline (ZOLOFT) 100 MG tablet Take 100 mg by mouth daily.    [provider]  ?torsemide (DEMADEX) 20 MG tablet Take 1 tablet (20 mg  total) by mouth daily as needed. 12/01/20   Wellington Hampshire, MD  ?traZODone (DESYREL) 50 MG tablet Take 25 mg by mouth at bedtime.    [provider]  ? ? ?Family History ?Family History  ?Problem Relation Age of Onset  ? Coronary artery disease Mother   ? Diabetes Mother   ? Stroke Other   ? Coronary artery disease Father   ? Diabetes Father   ? ? ?Social History ?Social History  ? ?Tobacco Use  ? Smoking status: Former  ?  Packs/day: 2.50  ?  Years: 35.00  ?  Pack years: 87.50  ?  Types: Cigarettes  ?  Quit date: 02/05/1994  ?  Years since quitting: 27.3  ? Smokeless tobacco: Never  ?Vaping Use  ? Vaping Use: Never used  ?Substance Use Topics  ? Alcohol use: Not Currently  ? Drug use: No  ? ? ? ?  Allergies   ?Lisinopril and Simvastatin ? ? ?Review of Systems ?Review of Systems  ?Skin:  Positive for wound. Negative for color change.  ?Neurological:  Negative for weakness and numbness.  ?All other systems reviewed and are negative. ? ? ?Physical Exam ?Triage Vital Signs ?ED Triage Vitals  ?Enc Vitals Group  ?   BP   ?   Pulse   ?   Resp   ?   Temp   ?   Temp src   ?   SpO2   ?   Weight   ?   Height   ?   Head Circumference   ?   Peak Flow   ?   Pain Score   ?   Pain Loc   ?   Pain Edu?   ?   Excl. in [place]?   ? ?No data found. ? ?Updated Vital Signs ?BP 113/63   Pulse 78   Temp 98.4 ?F (36.9 ?C)   Resp 18   SpO2 95%  ? ?Visual Acuity ?Right Eye Distance:   ?Left Eye Distance:   ?Bilateral Distance:   ? ?Right Eye Near:   ?Left Eye Near:    ?Bilateral Near:    ? ?Physical Exam ?Vitals and nursing note reviewed.  ?Constitutional:   ?   General: He is not in acute distress. ?   Appearance: Normal appearance. He is well-developed. He is not ill-appearing.  ?HENT:  ?   Mouth/Throat:  ?   Mouth: Mucous membranes are moist.  ?Cardiovascular:  ?   Rate and Rhythm: Normal rate and regular rhythm.  ?Pulmonary:  ?   Effort: Pulmonary effort is normal. No respiratory distress.  ?   Breath sounds: Normal breath sounds.   ?Musculoskeletal:     ?   General: No deformity. Normal range of motion.  ?   Cervical back: Neck supple.  ?Skin: ?   General: Skin is warm and dry.  ?   Capillary Refill: Capillary refill takes less tha

## 2021-05-23 ENCOUNTER — Ambulatory Visit (INDEPENDENT_AMBULATORY_CARE_PROVIDER_SITE_OTHER): Payer: Medicare Other | Admitting: Cardiovascular Disease

## 2021-05-23 ENCOUNTER — Encounter: Payer: Self-pay | Admitting: Cardiovascular Disease

## 2021-05-23 VITALS — BP 120/58 | HR 83 | Ht 72.0 in | Wt 261.2 lb

## 2021-05-23 DIAGNOSIS — I872 Venous insufficiency (chronic) (peripheral): Secondary | ICD-10-CM

## 2021-05-23 DIAGNOSIS — I739 Peripheral vascular disease, unspecified: Secondary | ICD-10-CM

## 2021-05-23 DIAGNOSIS — I779 Disorder of arteries and arterioles, unspecified: Secondary | ICD-10-CM | POA: Diagnosis not present

## 2021-05-23 DIAGNOSIS — I6521 Occlusion and stenosis of right carotid artery: Secondary | ICD-10-CM | POA: Diagnosis not present

## 2021-05-23 DIAGNOSIS — I2510 Atherosclerotic heart disease of native coronary artery without angina pectoris: Secondary | ICD-10-CM

## 2021-05-23 DIAGNOSIS — E785 Hyperlipidemia, unspecified: Secondary | ICD-10-CM

## 2021-05-23 NOTE — H&P (View-Only) (Signed)
?  ?Cardiology Office Note ? ? ?Date:  05/23/2021  ? ?ID:  Derek Hogan, DOB 04-Mar-1945, MRN [id number] ? ?PCP:  Kirk Ruths, MD  ?Cardiologist:  Dr. Fletcher Anon ? ?Chief Complaint  ?Patient presents with  ? Other  ?  PAD no complaints today. Meds reviewed verbally with pt.  ? ? ?  ?History of Present Illness: ?Derek Hogan is a 76 y.o. male who presents for a follow-up visit regarding coronary artery disease and peripheral arterial disease.  He has known history of CAD S/P previous inferior wall myocardial infarction followed by CABG in 1996. He had questionable TIA in March 2016. He quit smoking in 1996 after CABG. He has prolonged history of diabetes currently on insulin pump, Carotid disease status post right carotid endarterectomy, hyperlipidemia and hypertension. ?He is known to have peripheral arterial disease.  He had drug-coated balloon angioplasty followed by self-expanding stent placement to the proximal right popliteal artery for severe claudication in November 2016. ? ?He had a Lexiscan Myoview in May 2021 which showed no evidence of ischemia.   ?He is known to have right leg edema due to suspected venous insufficiency.  He had lower extremity venous Doppler in August 2021 which showed no evidence of DVT. ? ?Echocardiogram in August 2022 showed normal LV systolic function with grade 1 diastolic dysfunction and no significant valvular abnormalities.   ? ?He had sleep study done at the [place] which showed mild obstructive sleep apnea.  He was seen by Christell Faith last month for increased lower extremity edema.  He is known to have chronic venous insufficiency. ? ?He is followed by podiatry for small ulceration on the left big toe that has not healed over the last month.  He has discomfort in the left foot but no significant  calf claudication. ? ?Past Medical History:  ?Diagnosis Date  ? CAD, ARTERY BYPASS GRAFT   ? a. 1996 s/p CABG x 4 (LIMA->LAD, VG->D1, VG->OM3, VG->RPDA; b. 12/2016 MV [facility]): EF  47%, anteroapical and inf infarcts w/ anteroapical peri-infarct ischemia; c. 01/2017: Cath: LM 19md, LAD 100p/m, D1 90ost, 70p, LCX 80ost/p, 1080mRCA 4013m5d, RPDA 95ost, VG->RPDA 30p/m, VG->OM3 nl, LIMA->LAD nl, VG->D1 nl.  ? Carotid arterial disease ([place]CC[place] ? a. s/p remote R CEA (Dr. GreDrucie Opitzb. 01/2018 Carotid U/S: RICA 1-33-50%CCA <50<09%ICA 1-33-81%/u 12 mos.  ? Dyslipidemia   ? ERECTILE DYSFUNCTION 06/09/2008  ? Erectile dysfunction   ? GERD (gastroesophageal reflux disease)   ? Heart attack (HCC[place] Gr[place]996  ? HYPERLIPIDEMIA-MIXED 06/09/2008  ? Hypertension   ? HYPERTENSION, UNSPECIFIED 06/09/2008  ? Orthostatic hypotension   ? PVD (peripheral vascular disease) (HCCA[place] ? a. 02/2014 s/p DBA/stent to prox R popliteal; b.  08/2017 Vasc u/s/TBI: Patent mid R pop stent. LSFA 30-40%. TBI: R 0.79, L 0.51 - no significant change, f/u 1 yr.  ? Transient ischemic attack (TIA)   ? Type I diabetes mellitus (HCCOld Brownsboro Placex'd 08/20/1966  ? UNSPECIFIED PERIPHERAL VASCULAR DISEASE 10/06/2008  ? Viral gastroenteritis   ? 04/19/2010 improved  ? Weakness   ? ? ?Past Surgical History:  ?Procedure Laterality Date  ? CARD[place] 2010  ? CAROTID ENDARTERECTOMY Right   ? CATARACT EXTRACTION, BILATERAL Bilateral   ? COLONOSCOPY WITH PROPOFOL N/A 06/07/2020  ? Procedure: COLONOSCOPY WITH PROPOFOL;  Surgeon: LocLesly RubensteinD;  [facility];  Service: Endoscopy;  Laterality: N/A;  ? CORONARY ANGIOPLASTY    ? CORONARY  ARTERY BYPASS GRAFT  1996  ? "CABG X4"  ? ESOPHAGOGASTRODUODENOSCOPY N/A 06/07/2020  ? Procedure: ESOPHAGOGASTRODUODENOSCOPY (EGD);  Surgeon: Lesly Rubenstein, MD;  [facility];  Service: Endoscopy;  Laterality: N/A;  ? LEFT HEART CATH AND CORS/GRAFTS ANGIOGRAPHY N/A 01/11/2017  ? Procedure: LEFT HEART CATH AND CORS/GRAFTS ANGIOGRAPHY;  Surgeon: Jolaine Artist, MD;  Location: [place] CV LAB;  Service: Cardiovascular;  Laterality: N/A;  ? MOHS SURGERY    ? PERIPHERAL VASCULAR  CATHETERIZATION N/A 12/15/2014  ? Procedure: Abdominal Aortogram;  Surgeon: Wellington Hampshire, MD;  Location: [place] CV LAB;  Service: Cardiovascular;  Laterality: N/A;  ? REFRACTIVE SURGERY Right   ? "before cataract OR"  ? ? ? ?Current Outpatient Medications  ?Medication Sig Dispense Refill  ? aspirin 81 MG tablet Take 162 mg by mouth at bedtime.     ? atorvastatin (LIPITOR) 80 MG tablet Take 1 tablet (80 mg total) by mouth daily.    ? Calcium Carb-Cholecalciferol 500-100 MG-UNIT CHEW CHEW 1 TABLET BY MOUTH TWO TIMES A DAY FOR BONE HEALTH    ? carvedilol (COREG) 3.125 MG tablet Take 3.125 mg by mouth 2 (two) times daily with a meal.    ? Cholecalciferol 25 MCG (1000 UT) tablet Take 1 tablet by mouth daily.    ? ezetimibe (ZETIA) 10 MG tablet Take 10 mg by mouth daily.    ? glucagon 1 MG injection INJECT 1MG KIT INTRAMUSCULARLY  AS NEEDED FOR EMERGENCY HYPOGLYCEMIA    ? hydrOXYzine (ATARAX/VISTARIL) 25 MG tablet Take 1 tablet by mouth daily as needed.    ? insulin aspart (NOVOLOG) 100 unit/mL injection Inject into the skin continuous. Insulin Pump    ? losartan (COZAAR) 100 MG tablet Take 100 mg by mouth daily.    ? methocarbamol (ROBAXIN) 500 MG tablet Take 500 mg by mouth.    ? mupirocin ointment (BACTROBAN) 2 % Apply 1 application. topically 2 (two) times daily. 30 g 2  ? nitroGLYCERIN (NITROSTAT) 0.4 MG SL tablet Place 0.4 mg under the tongue every 5 (five) minutes as needed for chest pain.     ? omeprazole (PRILOSEC) 20 MG capsule Take 20 mg by mouth every morning.     ? rivaroxaban (XARELTO) 2.5 MG TABS tablet Take 1 tablet (2.5 mg total) by mouth 2 (two) times daily. 60 tablet 6  ? sertraline (ZOLOFT) 100 MG tablet Take 100 mg by mouth daily.    ? torsemide (DEMADEX) 20 MG tablet Take 1 tablet (20 mg total) by mouth daily as needed. 60 tablet 3  ? traZODone (DESYREL) 50 MG tablet Take 25 mg by mouth at bedtime.    ? ?No current facility-administered medications for this visit.  ? ? ?Allergies:    Lisinopril and Simvastatin  ? ? ?Social History:  The patient  reports that he quit smoking about 27 years ago. His smoking use included cigarettes. He has a 87.50 pack-year smoking history. He has never used smokeless tobacco. He reports that he does not currently use alcohol. He reports that he does not use drugs.  ? ?Family History:  The patient's family history includes Coronary artery disease in his father and mother; Diabetes in his father and mother; Stroke in an other family member.  ? ? ?ROS:  Please see the history of present illness.   Otherwise, review of systems are positive for none.   All other systems are reviewed and negative.  ? ? ?PHYSICAL EXAM: ?VS:  BP (!) 120/58 (BP  Location: Left Arm, Patient Position: Sitting, Cuff Size: Normal)   Pulse 83   Ht 6' (1.829 m)   Wt 261 lb 4 oz (118.5 kg)   SpO2 97%   BMI 35.43 kg/m?  , BMI Body mass index is 35.43 kg/m?. ?GEN: Well nourished, well developed, in no acute distress  ?HEENT: normal  ?Neck: no JVD, or masses. Right carotid bruit ?Cardiac: RRR; no murmurs, rubs, or gallops, trace right leg swelling. ?Respiratory:  clear to auscultation bilaterally, normal work of breathing ?GI: soft, nontender, nondistended, + BS ?MS: no deformity or atrophy  ?Skin: warm and dry, no rash ?Neuro:  Strength and sensation are intact ?Psych: euthymic mood, full affect ?Vascular: Femoral pulse is difficult to palpate bilaterally.  Distal pulses are not palpable. ? ?EKG:  EKG is not ordered today. ? ? ? ?Recent Labs: ?09/07/2020: B Natriuretic Peptide 83.2 ?05/09/2021: BUN 16; Creatinine, Ser 0.93; Potassium 4.9; Sodium 140  ? ? ?Lipid Panel ?   ?Component Value Date/Time  ? CHOL 116 07/08/2014 0830  ? TRIG 51 07/08/2014 0830  ? HDL 43 07/08/2014 0830  ? CHOLHDL 2.7 07/08/2014 0830  ? CHOLHDL 2.7 07/19/2010 0839  ? VLDL 10 07/19/2010 0839  ? [place] 63 07/08/2014 0830  ? ?  ? ?Wt Readings from Last 3 Encounters:  ?05/23/21 261 lb 4 oz (118.5 kg)  ?05/02/21 264 lb (119.7  kg)  ?12/01/20 251 lb 4 oz (114 kg)  ?  ? ? ? ? ?ASSESSMENT AND PLAN: ? ?1.  Peripheral arterial disease: Status post angioplasty and stent placement to the right popliteal artery.    He is on aspirin and low-dose Xarelto

## 2021-05-23 NOTE — Progress Notes (Signed)
?  ?Cardiology Office Note ? ? ?Date:  05/23/2021  ? ?ID:  Derek Hogan, DOB 04-Mar-1945, MRN [id number] ? ?PCP:  Kirk Ruths, MD  ?Cardiologist:  Dr. Fletcher Anon ? ?Chief Complaint  ?Patient presents with  ? Other  ?  PAD no complaints today. Meds reviewed verbally with pt.  ? ? ?  ?History of Present Illness: ?Derek Hogan is a 76 y.o. male who presents for a follow-up visit regarding coronary artery disease and peripheral arterial disease.  He has known history of CAD S/P previous inferior wall myocardial infarction followed by CABG in 1996. He had questionable TIA in March 2016. He quit smoking in 1996 after CABG. He has prolonged history of diabetes currently on insulin pump, Carotid disease status post right carotid endarterectomy, hyperlipidemia and hypertension. ?He is known to have peripheral arterial disease.  He had drug-coated balloon angioplasty followed by self-expanding stent placement to the proximal right popliteal artery for severe claudication in November 2016. ? ?He had a Lexiscan Myoview in May 2021 which showed no evidence of ischemia.   ?He is known to have right leg edema due to suspected venous insufficiency.  He had lower extremity venous Doppler in August 2021 which showed no evidence of DVT. ? ?Echocardiogram in August 2022 showed normal LV systolic function with grade 1 diastolic dysfunction and no significant valvular abnormalities.   ? ?He had sleep study done at the [place] which showed mild obstructive sleep apnea.  He was seen by Christell Faith last month for increased lower extremity edema.  He is known to have chronic venous insufficiency. ? ?He is followed by podiatry for small ulceration on the left big toe that has not healed over the last month.  He has discomfort in the left foot but no significant  calf claudication. ? ?Past Medical History:  ?Diagnosis Date  ? CAD, ARTERY BYPASS GRAFT   ? a. 1996 s/p CABG x 4 (LIMA->LAD, VG->D1, VG->OM3, VG->RPDA; b. 12/2016 MV [facility]): EF  47%, anteroapical and inf infarcts w/ anteroapical peri-infarct ischemia; c. 01/2017: Cath: LM 19md, LAD 100p/m, D1 90ost, 70p, LCX 80ost/p, 1080mRCA 4013m5d, RPDA 95ost, VG->RPDA 30p/m, VG->OM3 nl, LIMA->LAD nl, VG->D1 nl.  ? [place] arterial disease ([place] ? a. s/p remote R CEA (Dr. GreDrucie Opitzb. 01/2018 Carotid U/S: RICA 1-33-50%CCA <50<09%ICA 1-33-81%/u 12 mos.  ? Dyslipidemia   ? ERECTILE DYSFUNCTION 06/09/2008  ? Erectile dysfunction   ? GERD (gastroesophageal reflux disease)   ? Heart [place] ([place]  ? HYPERLIPIDEMIA-MIXED 06/09/2008  ? Hypertension   ? HYPERTENSION, UNSPECIFIED 06/09/2008  ? Orthostatic hypotension   ? PVD (peripheral vascular disease) ([place] ? a. 02/2014 s/p DBA/stent to prox R popliteal; b.  08/2017 Vasc u/s/TBI: Patent mid R pop stent. LSFA 30-40%. TBI: R 0.79, L 0.51 - no significant change, f/u 1 yr.  ? Transient ischemic attack (TIA)   ? Type I diabetes mellitus (HCCOld [place] Placex'd 08/20/1966  ? UNSPECIFIED PERIPHERAL VASCULAR DISEASE 10/06/2008  ? Viral gastroenteritis   ? 04/19/2010 improved  ? Weakness   ? ? ?Past Surgical History:  ?Procedure Laterality Date  ? CARD[place] 2010  ? CAROTID ENDARTERECTOMY Right   ? CATARACT EXTRACTION, BILATERAL Bilateral   ? COLONOSCOPY WITH PROPOFOL N/A 06/07/2020  ? Procedure: COLONOSCOPY WITH PROPOFOL;  Surgeon: LocLesly RubensteinD;  [facility];  Service: Endoscopy;  Laterality: N/A;  ? CORONARY ANGIOPLASTY    ? CORONARY  ARTERY BYPASS GRAFT  1996  ? "CABG X4"  ? ESOPHAGOGASTRODUODENOSCOPY N/A 06/07/2020  ? Procedure: ESOPHAGOGASTRODUODENOSCOPY (EGD);  Surgeon: Lesly Rubenstein, MD;  [facility] END[facility];  Service: Endoscopy;  Laterality: N/A;  ? LEFT HEART CATH AND CORS/GRAFTS ANGIOGRAPHY N/A 01/11/2017  ? Procedure: LEFT HEART CATH AND CORS/GRAFTS ANGIOGRAPHY;  Surgeon: Jolaine Artist, MD;  Location: [place] CV LAB;  Service: Cardiovascular;  Laterality: N/A;  ? MOHS SURGERY    ? PERIPHERAL VASCULAR  CATHETERIZATION N/A 12/15/2014  ? Procedure: Abdominal Aortogram;  Surgeon: Wellington Hampshire, MD;  Location: [place] CV LAB;  Service: Cardiovascular;  Laterality: N/A;  ? REFRACTIVE SURGERY Right   ? "before cataract OR"  ? ? ? ?Current Outpatient Medications  ?Medication Sig Dispense Refill  ? aspirin 81 MG tablet Take 162 mg by mouth at bedtime.     ? atorvastatin (LIPITOR) 80 MG tablet Take 1 tablet (80 mg total) by mouth daily.    ? Calcium Carb-Cholecalciferol 500-100 MG-UNIT CHEW CHEW 1 TABLET BY MOUTH TWO TIMES A DAY FOR BONE HEALTH    ? carvedilol (COREG) 3.125 MG tablet Take 3.125 mg by mouth 2 (two) times daily with a meal.    ? Cholecalciferol 25 MCG (1000 UT) tablet Take 1 tablet by mouth daily.    ? ezetimibe (ZETIA) 10 MG tablet Take 10 mg by mouth daily.    ? glucagon 1 MG injection INJECT 1MG KIT INTRAMUSCULARLY  AS NEEDED FOR EMERGENCY HYPOGLYCEMIA    ? hydrOXYzine (ATARAX/VISTARIL) 25 MG tablet Take 1 tablet by mouth daily as needed.    ? insulin aspart (NOVOLOG) 100 unit/mL injection Inject into the skin continuous. Insulin Pump    ? losartan (COZAAR) 100 MG tablet Take 100 mg by mouth daily.    ? methocarbamol (ROBAXIN) 500 MG tablet Take 500 mg by mouth.    ? mupirocin ointment (BACTROBAN) 2 % Apply 1 application. topically 2 (two) times daily. 30 g 2  ? nitroGLYCERIN (NITROSTAT) 0.4 MG SL tablet Place 0.4 mg under the tongue every 5 (five) minutes as needed for chest pain.     ? omeprazole (PRILOSEC) 20 MG capsule Take 20 mg by mouth every morning.     ? rivaroxaban (XARELTO) 2.5 MG TABS tablet Take 1 tablet (2.5 mg total) by mouth 2 (two) times daily. 60 tablet 6  ? sertraline (ZOLOFT) 100 MG tablet Take 100 mg by mouth daily.    ? torsemide (DEMADEX) 20 MG tablet Take 1 tablet (20 mg total) by mouth daily as needed. 60 tablet 3  ? traZODone (DESYREL) 50 MG tablet Take 25 mg by mouth at bedtime.    ? ?No current facility-administered medications for this visit.  ? ? ?Allergies:    Lisinopril and Simvastatin  ? ? ?Social History:  The patient  reports that he quit smoking about 27 years ago. His smoking use included cigarettes. He has a 87.50 pack-year smoking history. He has never used smokeless tobacco. He reports that he does not currently use alcohol. He reports that he does not use drugs.  ? ?Family History:  The patient's family history includes Coronary artery disease in his father and mother; Diabetes in his father and mother; Stroke in an other family member.  ? ? ?ROS:  Please see the history of present illness.   Otherwise, review of systems are positive for none.   All other systems are reviewed and negative.  ? ? ?PHYSICAL EXAM: ?VS:  BP (!) 120/58 (BP  Location: Left Arm, Patient Position: Sitting, Cuff Size: Normal)   Pulse 83   Ht 6' (1.829 m)   Wt 261 lb 4 oz (118.5 kg)   SpO2 97%   BMI 35.43 kg/m?  , BMI Body mass index is 35.43 kg/m?. ?GEN: Well nourished, well developed, in no acute distress  ?HEENT: normal  ?Neck: no JVD, or masses. Right carotid bruit ?Cardiac: RRR; no murmurs, rubs, or gallops, trace right leg swelling. ?Respiratory:  clear to auscultation bilaterally, normal work of breathing ?GI: soft, nontender, nondistended, + BS ?MS: no deformity or atrophy  ?Skin: warm and dry, no rash ?Neuro:  Strength and sensation are intact ?Psych: euthymic mood, full affect ?Vascular: Femoral pulse is difficult to palpate bilaterally.  Distal pulses are not palpable. ? ?EKG:  EKG is not ordered today. ? ? ? ?Recent Labs: ?09/07/2020: B Natriuretic Peptide 83.2 ?05/09/2021: BUN 16; Creatinine, Ser 0.93; Potassium 4.9; Sodium 140  ? ? ?Lipid Panel ?   ?Component Value Date/Time  ? CHOL 116 07/08/2014 0830  ? TRIG 51 07/08/2014 0830  ? HDL 43 07/08/2014 0830  ? CHOLHDL 2.7 07/08/2014 0830  ? CHOLHDL 2.7 07/19/2010 0839  ? VLDL 10 07/19/2010 0839  ? [place] 63 07/08/2014 0830  ? ?  ? ?Wt Readings from Last 3 Encounters:  ?05/23/21 261 lb 4 oz (118.5 kg)  ?05/02/21 264 lb (119.7  kg)  ?12/01/20 251 lb 4 oz (114 kg)  ?  ? ? ? ? ?ASSESSMENT AND PLAN: ? ?1.  Peripheral arterial disease: Status post angioplasty and stent placement to the right popliteal artery.    He is on aspirin and low-dose Xarelto

## 2021-05-23 NOTE — Patient Instructions (Signed)
Medication Instructions:  ?Your physician recommends that you continue on your current medications as directed. Please refer to the Current Medication list given to you today. ? ?*If you need a refill on your cardiac medications before your next appointment, please call your pharmacy* ? ? ?Lab Work: ?None ordered ?If you have labs (blood work) drawn today and your tests are completely normal, you will receive your results only by: ?MyChart Message (if you have MyChart) OR ?A paper copy in the mail ?If you have any lab test that is abnormal or we need to change your treatment, we will call you to review the results. ? ? ?Testing/Procedures: ? PV testing as scheduled ? ? ?Follow-Up: ?At [facility], you and your health needs are our priority.  As part of our continuing mission to provide you with exceptional heart care, we have created designated Provider Care Teams.  These Care Teams include your primary Cardiologist (physician) and Advanced Practice Providers (APPs -  Physician Assistants and Nurse Practitioners) who all work together to provide you with the care you need, when you need it. ? ?We recommend signing up for the patient portal called "MyChart".  Sign up information is provided on this After Visit Summary.  MyChart is used to connect with patients for Virtual Visits (Telemedicine).  Patients are able to view lab/test results, encounter notes, upcoming appointments, etc.  Non-urgent messages can be sent to your provider as well.   ?To learn more about what you can do with MyChart, go to [email or web address].   ? ?Your next appointment:   ?Pending PV test results ? ?The format for your next appointment:   ?In Person ? ?Provider:   ?You may see Kathlyn Sacramento, MD or one of the following Advanced Practice Providers on your designated Care Team:   ?Murray Hodgkins, NP ?Christell Faith, PA-C ?Cadence Kathlen Mody, PA-C ? ?Other Instructions ?N/A ? ?Important Information About Sugar ? ? ? ? ? ? ?

## 2021-05-24 ENCOUNTER — Other Ambulatory Visit: Payer: Self-pay | Admitting: *Deleted

## 2021-05-24 DIAGNOSIS — G4733 Obstructive sleep apnea (adult) (pediatric): Secondary | ICD-10-CM

## 2021-05-26 ENCOUNTER — Ambulatory Visit (INDEPENDENT_AMBULATORY_CARE_PROVIDER_SITE_OTHER): Payer: Medicare Other

## 2021-05-26 ENCOUNTER — Other Ambulatory Visit: Payer: Medicare Other

## 2021-05-26 DIAGNOSIS — I2510 Atherosclerotic heart disease of native coronary artery without angina pectoris: Secondary | ICD-10-CM | POA: Diagnosis not present

## 2021-05-26 DIAGNOSIS — M7989 Other specified soft tissue disorders: Secondary | ICD-10-CM

## 2021-05-26 LAB — ECHOCARDIOGRAM COMPLETE
AR max vel: 2.95 cm2
AV Area VTI: 3.57 cm2
AV Area mean vel: 2.44 cm2
AV Mean grad: 3 mmHg
AV Peak grad: 4.9 mmHg
Ao pk vel: 1.11 m/s
Area-P 1/2: 2.51 cm2
MV VTI: 2.56 cm2
S' Lateral: 3.8 cm

## 2021-05-26 MED ORDER — PERFLUTREN LIPID MICROSPHERE
1.0000 mL | INTRAVENOUS | Status: AC | PRN
  Administered 2021-05-26: 4 mL via INTRAVENOUS

## 2021-05-29 ENCOUNTER — Ambulatory Visit (INDEPENDENT_AMBULATORY_CARE_PROVIDER_SITE_OTHER)
Admission: RE | Admit: 2021-05-29 | Discharge: 2021-05-29 | Disposition: A | Discharge status: Home / Self Care | Payer: Medicare Other | Source: Ambulatory Visit | Attending: Physician Assistant | Admitting: Physician Assistant

## 2021-05-29 ENCOUNTER — Telehealth: Payer: Self-pay | Admitting: *Deleted

## 2021-05-29 ENCOUNTER — Ambulatory Visit
Admission: EM | Admit: 2021-05-29 | Discharge: 2021-05-29 | Disposition: A | Discharge status: Home / Self Care | Payer: Medicare Other

## 2021-05-29 ENCOUNTER — Ambulatory Visit (HOSPITAL_COMMUNITY)
Admission: RE | Admit: 2021-05-29 | Discharge: 2021-05-29 | Disposition: A | Discharge status: Home / Self Care | Payer: Medicare Other | Source: Ambulatory Visit | Attending: Podiatry | Admitting: Podiatry

## 2021-05-29 ENCOUNTER — Ambulatory Visit (HOSPITAL_BASED_OUTPATIENT_CLINIC_OR_DEPARTMENT_OTHER)
Admission: RE | Admit: 2021-05-29 | Discharge: 2021-05-29 | Disposition: A | Discharge status: Home / Self Care | Payer: Medicare Other | Source: Ambulatory Visit | Attending: Physician Assistant | Admitting: Physician Assistant

## 2021-05-29 DIAGNOSIS — I6521 Occlusion and stenosis of right carotid artery: Secondary | ICD-10-CM

## 2021-05-29 DIAGNOSIS — L97521 Non-pressure chronic ulcer of other part of left foot limited to breakdown of skin: Secondary | ICD-10-CM | POA: Insufficient documentation

## 2021-05-29 DIAGNOSIS — I739 Peripheral vascular disease, unspecified: Secondary | ICD-10-CM | POA: Insufficient documentation

## 2021-05-29 MED ORDER — CARVEDILOL 6.25 MG PO TABS: 6.2500 mg | ORAL_TABLET | Freq: Two times a day (BID) | ORAL | 3 refills | Status: AC

## 2021-05-29 NOTE — ED Triage Notes (Signed)
Pt here for suture removal. 4 stitches to remove. Wound slightly dehisced, approved by Dr. Mannie Stabile to take out stitches and instruct patient to continue to keep wound clean and dry with abx ointment until completely closed.   ?

## 2021-05-29 NOTE — Telephone Encounter (Signed)
-----   Message from Rise Mu, PA-C sent at 05/25/2021  4:03 PM EDT ----- ?Outpatient cardiac monitoring showed a predominant rhythm of sinus with an average rate of 77 bpm (range 60 to 144 bpm), 5 episodes of SVT with the longest lasting 20 beats and occasional PVCs with a burden of 5%.   ? ?Recommendation: ?-Increase carvedilol to 6.25 mg twice daily ?-Await vascular imaging, with follow-up plan based on these results ? ?

## 2021-05-29 NOTE — Telephone Encounter (Signed)
-----   Message from Rise Mu, PA-C sent at 05/26/2021  3:57 PM EDT ----- ?Echo showed normal pump function, normal wall motion, slightly stiffened heart, no significant valvular abnormalities, and normal pressure within the right side of the heart.  Overall, reassuring study. ?

## 2021-05-29 NOTE — Progress Notes (Signed)
ABI's, carotid artery duplex, and bilateral lower extremity arterial duplex has been completed. ?Preliminary results can be found in CV Proc through chart review.  ? ?05/29/21 8:03 AM ?Carlos Levering RVT   ?

## 2021-05-29 NOTE — Telephone Encounter (Signed)
Reviewed results and recommendations with patient. He requested that I send prescription over to the [facility] hospital in [place]. Updated prescription sent in and he verbalized understanding of our conversation with no further questions at this time.  ?

## 2021-05-30 ENCOUNTER — Other Ambulatory Visit
Admission: RE | Admit: 2021-05-30 | Discharge: 2021-05-30 | Disposition: A | Discharge status: Home / Self Care | Payer: Medicare Other | Source: Ambulatory Visit | Attending: Cardiovascular Disease | Admitting: Cardiovascular Disease

## 2021-05-30 ENCOUNTER — Encounter: Payer: Self-pay | Admitting: Adult Health

## 2021-05-30 ENCOUNTER — Telehealth: Payer: Self-pay | Admitting: Cardiovascular Disease

## 2021-05-30 ENCOUNTER — Telehealth: Payer: Self-pay

## 2021-05-30 ENCOUNTER — Ambulatory Visit (INDEPENDENT_AMBULATORY_CARE_PROVIDER_SITE_OTHER): Payer: Medicare Other | Admitting: Adult Health

## 2021-05-30 ENCOUNTER — Telehealth: Payer: Self-pay | Admitting: Podiatry

## 2021-05-30 DIAGNOSIS — I739 Peripheral vascular disease, unspecified: Secondary | ICD-10-CM | POA: Diagnosis present

## 2021-05-30 DIAGNOSIS — I2510 Atherosclerotic heart disease of native coronary artery without angina pectoris: Secondary | ICD-10-CM

## 2021-05-30 DIAGNOSIS — I6521 Occlusion and stenosis of right carotid artery: Secondary | ICD-10-CM | POA: Diagnosis not present

## 2021-05-30 DIAGNOSIS — G4733 Obstructive sleep apnea (adult) (pediatric): Secondary | ICD-10-CM | POA: Diagnosis not present

## 2021-05-30 LAB — CBC WITH DIFFERENTIAL/PLATELET
Abs Immature Granulocytes: 0.05 10*3/uL (ref 0.00–0.07)
Basophils Absolute: 0.1 10*3/uL (ref 0.0–0.1)
Basophils Relative: 1 %
Eosinophils Absolute: 0.2 10*3/uL (ref 0.0–0.5)
Eosinophils Relative: 2 %
HCT: 38.1 % — ABNORMAL LOW (ref 39.0–52.0)
Hemoglobin: 12.3 g/dL — ABNORMAL LOW (ref 13.0–17.0)
Immature Granulocytes: 1 %
Lymphocytes Relative: 29 %
Lymphs Abs: 3.1 10*3/uL (ref 0.7–4.0)
MCH: 28.8 pg (ref 26.0–34.0)
MCHC: 32.3 g/dL (ref 30.0–36.0)
MCV: 89.2 fL (ref 80.0–100.0)
Monocytes Absolute: 1 10*3/uL (ref 0.1–1.0)
Monocytes Relative: 10 %
Neutro Abs: 6 10*3/uL (ref 1.7–7.7)
Neutrophils Relative %: 57 %
Platelets: 243 10*3/uL (ref 150–400)
RBC: 4.27 MIL/uL (ref 4.22–5.81)
RDW: 14.2 % (ref 11.5–15.5)
WBC: 10.4 10*3/uL (ref 4.0–10.5)
nRBC: 0 % (ref 0.0–0.2)

## 2021-05-30 LAB — BASIC METABOLIC PANEL
Anion gap: 8 (ref 5–15)
BUN: 18 mg/dL (ref 8–23)
CO2: 25 mmol/L (ref 22–32)
Calcium: 9 mg/dL (ref 8.9–10.3)
Chloride: 104 mmol/L (ref 98–111)
Creatinine, Ser: 1.1 mg/dL (ref 0.61–1.24)
GFR, Estimated: >60 mL/min (ref >60)
Glucose, Bld: 158 mg/dL — ABNORMAL HIGH (ref 70–99)
Potassium: 4.5 mmol/L (ref 3.5–5.1)
Sodium: 137 mmol/L (ref 135–145)

## 2021-05-30 NOTE — Telephone Encounter (Signed)
Carotid dopp ws ordered by Christell Faith, PA ?

## 2021-05-30 NOTE — Assessment & Plan Note (Signed)
Mild obstructive sleep apnea with nocturnal desaturations.  Patient has multiple comorbidities.  Patient education on sleep apnea and treatment options.  Patient will proceed with CPAP therapy. ?- discussed how weight can impact sleep and risk for sleep disordered breathing ?- discussed options to assist with weight loss: combination of diet modification, cardiovascular and strength training exercises ?  ?- had an extensive discussion regarding the adverse health consequences related to untreated sleep disordered breathing ?- specifically discussed the risks for hypertension, coronary artery disease, cardiac dysrhythmias, cerebrovascular disease, and diabetes ?- lifestyle modification discussed ?  ?- discussed how sleep disruption can increase risk of accidents, particularly when driving ?- safe driving practices were discussed ?  ? ?Plan  ?Patient Instructions  ?Begin CPAP At bedtime   ?Wear all night long  ?Work on healthy weight loss.  ?Do not drive if sleepy  ?Follow up with Derek Miedema NP or [place] in 3 months and As needed   ? ?  ? ?

## 2021-05-30 NOTE — Progress Notes (Signed)
? ?'@Patient'  ID: Thereasa Hogan, male    DOB: 03-08-1945, 76 y.o.   MRN: [id number] ? ?Chief Complaint  ?Patient presents with  ? sleep consult  ? ? ?Referring provider: ?Rise Mu, PA-C ? ?HPI: ?76 year old male seen for sleep consult May 30, 2021 to establish for sleep apnea ?Receives medical care at [facility] system ? ? ?TEST/EVENTS :  ? ?05/30/2021 Sleep consult  ?Patient presents for a sleep consult.  Kindly referred by his primary care provider.  Patient recently had a home sleep study that was completed on April 16, 2021 this showed mild sleep apnea with RDI at 11/hour, hypopnea index at 9.7/hour and SPO2 low at 78%. ?Patient typically goes to bed about 10 to 11 PM, takes about 15 minutes to go to sleep.  Is usually up about once each night.  It is up about 730 to 8 AM.  Weight is up about 15 pounds over the last 2 years. Snoring is keeping wife up . He has mild sleepiness during daytime .  ?Caffeine intake minimal  ?No symptoms suspicious for cataplexy or sleep paralysis. ? ?Patient is retired.  Previous veteran.  Is married and lives with his wife. ?Quit smoking 1996.  Quit drinking in 1982.  ? ?Family history positive for heart disease, ? ?Past medical history   T1DM -Insulin pump and CGM , HLD, Hypertension  ? ?Surgical history CABG in 1996, carotid surgery  ? ?Allergies  ?Allergen Reactions  ? Lisinopril Other (See Comments)  ? Simvastatin   ?  Other reaction(s): NONE STATED  ? ? ?Immunization History  ?Administered Date(s) Administered  ? Influenza, High Dose Seasonal PF 10/22/2017  ? Influenza, Seasonal, Injecte, Preservative Fre 10/30/2012  ? Influenza,inj,quad, With Preservative 10/07/2018  ? Influenza-Unspecified 10/05/2013, 11/15/2013, 10/11/2014, 11/10/2014, 10/27/2015, 10/27/2016, 10/28/2016, 10/22/2017, 10/22/2018, 12/07/2019, 10/12/2020, 11/25/2020  ? PFIZER Comirnaty(Gray Top)Covid-19 Tri-Sucrose Vaccine 05/26/2019, 06/16/2019, 12/25/2019, 08/16/2020  ? PFIZER(Purple Top)SARS-COV-2 Vaccination  05/26/2019, 06/16/2019, 12/25/2019  ? Pension scheme manager 75yr & up 12/30/2020  ? Pneumococcal Conjugate-13 11/05/2012, 06/18/2014  ? Pneumococcal Polysaccharide-23 03/08/1997, 12/31/2003, 03/04/2009, 07/06/2015  ? Tdap 05/22/2021  ? ? ?Past Medical History:  ?Diagnosis Date  ? CAD, ARTERY BYPASS GRAFT   ? a. 1996 s/p CABG x 4 (LIMA->LAD, VG->D1, VG->OM3, VG->RPDA; b. 12/2016 MV ([facility]: EF 47%, anteroapical and inf infarcts w/ anteroapical peri-infarct ischemia; c. 01/2017: Cath: LM 462m, LAD 100p/m, D1 90ost, 70p, LCX 80ost/p, 10015mCA 70m62md, RPDA 95ost, VG->RPDA 30p/m, VG->OM3 nl, LIMA->LAD nl, VG->D1 nl.  ? Carotid arterial disease (HCC)[place]? a. s/p remote R CEA (Dr. GregDrucie Opitz. 01/2018 Carotid U/S: RICA 1-397-89%CA <50%<38%CA 1-391-01%u 12 mos.  ? Dyslipidemia   ? ERECTILE DYSFUNCTION 06/09/2008  ? Erectile dysfunction   ? GERD (gastroesophageal reflux disease)   ? Heart attack (HCC)[place]96  ? HYPERLIPIDEMIA-MIXED 06/09/2008  ? Hypertension   ? HYPERTENSION, UNSPECIFIED 06/09/2008  ? Orthostatic hypotension   ? PVD (peripheral vascular disease) (HCC)[place]? a. 02/2014 s/p DBA/stent to prox R popliteal; b.  08/2017 Vasc u/s/TBI: Patent mid R pop stent. LSFA 30-40%. TBI: R 0.79, L 0.51 - no significant change, f/u 1 yr.  ? Transient ischemic attack (TIA)   ? Type I diabetes mellitus (HCC)[place] 08/20/1966  ? UNSPECIFIED PERIPHERAL VASCULAR DISEASE 10/06/2008  ? Viral gastroenteritis   ? 04/19/2010 improved  ? Weakness   ? ? ?Tobacco History: ?Social History  ? ?Tobacco Use  ?Smoking Status Former  ?  Packs/day: 3.00  ? Years: 35.00  ? Pack years: 105.00  ? Types: Cigarettes  ? Quit date: 02/05/1994  ? Years since quitting: 27.3  ?Smokeless Tobacco Never  ? ?Counseling given: Not Answered ? ? ?Outpatient Medications Prior to Visit  ?Medication Sig Dispense Refill  ? aspirin 81 MG tablet Take 162 mg by mouth at bedtime.     ? atorvastatin (LIPITOR) 80 MG tablet Take 1 tablet (80 mg total) by mouth daily.     ? Calcium Carb-Cholecalciferol 500-100 MG-UNIT CHEW CHEW 1 TABLET BY MOUTH TWO TIMES A DAY FOR BONE HEALTH    ? carvedilol (COREG) 6.25 MG tablet Take 1 tablet (6.25 mg total) by mouth 2 (two) times daily. 180 tablet 3  ? Cholecalciferol 25 MCG (1000 UT) tablet Take 1 tablet by mouth daily.    ? ezetimibe (ZETIA) 10 MG tablet Take 10 mg by mouth daily.    ? glucagon 1 MG injection INJECT 1MG KIT INTRAMUSCULARLY  AS NEEDED FOR EMERGENCY HYPOGLYCEMIA    ? hydrOXYzine (ATARAX/VISTARIL) 25 MG tablet Take 1 tablet by mouth daily as needed.    ? insulin aspart (NOVOLOG) 100 unit/mL injection Inject into the skin continuous. Insulin Pump    ? losartan (COZAAR) 100 MG tablet Take 100 mg by mouth daily.    ? methocarbamol (ROBAXIN) 500 MG tablet Take 500 mg by mouth.    ? nitroGLYCERIN (NITROSTAT) 0.4 MG SL tablet Place 0.4 mg under the tongue every 5 (five) minutes as needed for chest pain.     ? omeprazole (PRILOSEC) 20 MG capsule Take 20 mg by mouth every morning.     ? rivaroxaban (XARELTO) 2.5 MG TABS tablet Take 1 tablet (2.5 mg total) by mouth 2 (two) times daily. 60 tablet 6  ? sertraline (ZOLOFT) 100 MG tablet Take 100 mg by mouth daily.    ? torsemide (DEMADEX) 20 MG tablet Take 1 tablet (20 mg total) by mouth daily as needed. 60 tablet 3  ? traZODone (DESYREL) 50 MG tablet Take 25 mg by mouth at bedtime.    ? mupirocin ointment (BACTROBAN) 2 % Apply 1 application. topically 2 (two) times daily. 30 g 2  ? ?No facility-administered medications prior to visit.  ? ? ? ?Review of Systems:  ? ?Constitutional:   No  weight loss, night sweats,  Fevers, chills, ?+fatigue, or  lassitude. ? ?HEENT:   No headaches,  Difficulty swallowing,  Tooth/dental problems, or  Sore throat,  ?              No sneezing, itching, ear ache, nasal congestion, post nasal drip,  ? ?CV:  No chest pain,  Orthopnea, PND, swelling in lower extremities, anasarca, dizziness, palpitations, syncope.  ? ?GI  No heartburn, indigestion, abdominal pain,  nausea, vomiting, diarrhea, change in bowel habits, loss of appetite, bloody stools.  ? ?Resp: No shortness of breath with exertion or at rest.  No excess mucus, no productive cough,  No non-productive cough,  No coughing up of blood.  No change in color of mucus.  No wheezing.  No chest wall deformity ? ?Skin: no rash or lesions. ? ?GU: no dysuria, change in color of urine, no urgency or frequency.  No flank pain, no hematuria  ? ?MS:  No joint pain or swelling.  No decreased range of motion.  No back pain. ? ? ? ?Physical Exam ? ?BP 130/60 (BP Location: Left Arm, Cuff Size: Large)   Pulse 77   Temp 98.1 ?F (  36.7 ?C) (Temporal)   Ht 6' (1.829 m)   Wt 262 lb 6.4 oz (119 kg)   SpO2 96%   BMI 35.59 kg/m?  ? ?GEN: A/Ox3; pleasant , NAD, well nourished  ?  ?HEENT:  [place]/AT,  NOSE-clear, THROAT-clear, no lesions, no postnasal drip or exudate noted.  ?Class 2-3 MP airway  ? ?NECK:  Supple w/ fair ROM; no JVD; normal carotid impulses w/o bruits; no thyromegaly or nodules palpated; no lymphadenopathy.   ? ?RESP  Clear  P & A; w/o, wheezes/ rales/ or rhonchi. no accessory muscle use, no dullness to percussion ? ?CARD:  RRR, no m/r/g, tr  peripheral edema, pulses intact, no cyanosis or clubbing. ? ?GI:   Soft & nt; nml bowel sounds; no organomegaly or masses detected.  ? ?Musco: Warm bil, no deformities or joint swelling noted.  ? ?Neuro: alert, no focal deficits noted.   ? ?Skin: Warm, no lesions or rashes ? ? ? ?Lab Results: ? ? ? ?BMET ? ? ? ? ? ? ? ? ?No results found for: NITRICOXIDE ? ? ? ? ? ?Assessment & Plan:  ? ?OSA (obstructive sleep apnea) ?Mild obstructive sleep apnea with nocturnal desaturations.  Patient has multiple comorbidities.  Patient education on sleep apnea and treatment options.  Patient will proceed with CPAP therapy. ?- discussed how weight can impact sleep and risk for sleep disordered breathing ?- discussed options to assist with weight loss: combination of diet modification, cardiovascular and  strength training exercises ?  ?- had an extensive discussion regarding the adverse health consequences related to untreated sleep disordered breathing ?- specifically discussed the risks for hypertension

## 2021-05-30 NOTE — Telephone Encounter (Signed)
Patient came by office to check status of carotid test results ?Please call when available ?

## 2021-05-30 NOTE — Telephone Encounter (Signed)
Patient wanted to let you know that he cancelled his wound check for 5/1 due to having surgery on Wednesday to have his artery fixed? He will reschedule his appointment with you after he get settled from his surgery.

## 2021-05-30 NOTE — Telephone Encounter (Signed)
Patients abdominal aortogram scheduled on 5//3/23 @ 10:30 am with Dr. Fletcher Anon at [facility]. ? ?Patient made aware of the procedure date, time, and location. Verbal pre-procedure instructions reviewed with the patient with verbalized understanding. Written instructions sent to the patient through mychart. ? ? ?[place] ?[facility] ?[address], [address] ?[place] [address] ?Dept: [phone] ?Loc: [phone] ? ?Derek Hogan  05/30/2021 ? ?You are scheduled for a Peripheral Angiogram on Wednesday, May 3 with Dr. Kathlyn Sacramento. ? ?1. Please arrive at the Main Entrance A at [facility]: [place], [place] [address] at 8:30 AM (This time is two hours before your procedure to ensure your preparation). Free valet parking service is available.  ? ?Special note: Every effort is made to have your procedure done on time. Please understand that emergencies sometimes delay scheduled procedures. ? ?2. Diet: Do not eat solid foods after midnight.  You may have clear liquids until 5 AM upon the day of the procedure. ? ?3. Labs: You will need to have blood drawn on Tuesday, April 25 at [facility], Go to 1st desk on your right to register.  ?Address: [address], [place] [address]  ?Open: 8am - 5pm  Phone: [phone]. You do not need to be fasting. ? ?4. Medication instructions in preparation for your procedure: ? ? Contrast Allergy: No ? ? ?Stop taking Xarelto (Rivaroxaban) on Sunday, April 30. ? ?Stop taking, Torsemide (Demadex) Wednesday, May 3, ? ?Pause your insulin pump the morning of your procedure. ? ? ? ?On the morning of your procedure, take Aspirin and any morning medicines NOT listed above.  You may use sips of water. ? ?5. Plan to go home the same day, you will only stay overnight if medically necessary. ?6. You MUST have a responsible adult to drive you home. ?7. An adult MUST  be with you the first 24 hours after you arrive home. ?8. Bring a current list of your medications, and the last time and date medication taken. ?9. Bring ID and current insurance cards. ?10.Please wear clothes that are easy to get on and off and wear slip-on shoes. ? ?Thank you for allowing [place] to care for you! ?  -- [facility] ? ?

## 2021-05-30 NOTE — Patient Instructions (Signed)
Begin CPAP At bedtime   ?Wear all night long  ?Work on healthy weight loss.  ?Do not drive if sleepy  ?Follow up with Jovonta Levit NP or [place] in 3 months and As needed   ? ?

## 2021-05-30 NOTE — Telephone Encounter (Signed)
-----   Message from Wellington Hampshire, MD sent at 05/29/2021  4:58 PM EDT ----- ?Lattie Haw, ?I reviewed his Doppler studies and his left toe pressure could not be obtained.  I suspect he has significant disease on the left side.  Recommend proceeding with abdominal aortogram with lower extremity runoff and possible endovascular intervention on May 3.  Hold Xarelto 2 days before the procedure. ? ? ?----- Message ----- ?From: Lamar Laundry, RN ?Sent: 05/18/2021   7:57 AM EDT ?To: Wellington Hampshire, MD ? ?Ok got it ?----- Message ----- ?From: Wellington Hampshire, MD ?Sent: 05/18/2021   7:28 AM EDT ?To: Lamar Laundry, RN, Criselda Peaches, DPM ? ?Hi Dr. Sherryle Lis, it seems that he will require angiography.  ? ?Lattie Haw, please add this patient to my schedule next week.  ? ?Thanks.  ? ?----- Message ----- ?From: Criselda Peaches, DPM ?Sent: 05/17/2021  12:50 PM EDT ?To: Wellington Hampshire, MD ? ?Hi Dr Fletcher Anon, this patient is known to you for previous right LE stenting, he has a new ulcer on the distal left hallux, I do think a partial ingrown toenail removal procedure may benefit him but I have not seen any improvement in the ulcer size over the last several weeks he has upcoming studies next week and is wondering if you take a look at them to see if he would benefit from angiography, unable to palpate his pulses in his very sluggish capillary fill time.  Thanks! ? ? ? ?

## 2021-06-01 NOTE — Telephone Encounter (Signed)
See results note. Results have been reviewed with patient.  ?

## 2021-06-05 ENCOUNTER — Ambulatory Visit: Payer: Medicare Other | Admitting: Podiatry

## 2021-06-06 ENCOUNTER — Telehealth: Payer: Self-pay | Admitting: *Deleted

## 2021-06-06 NOTE — Telephone Encounter (Signed)
Abdominal aortogram scheduled at [facility] for: Wednesday Jun 07, 2021 10:30 AM ?Arrival time and place: [facility] at: 8:30 AM ? ? ?No solid food after midnight prior to cath, clear liquids until 5 AM day of procedure. ? ?Medication instructions: ?-Hold: ? Xarelto-pt reports last dose 06/03/21 until post procedure ? Torsemide-AM of procedure ? Insulin pump-pt will manage  ?-Except hold medications usual morning medications can be taken with sips of water including aspirin 81 mg. ? ?Confirmed patient has responsible adult to drive home post procedure and be with patient first 24 hours after arriving home. ? ?Patient reports no new symptoms concerning for COVID-19/no exposure to COVID-19 in the past 10 days. ? ?Reviewed procedure instructions with patient. ? ?

## 2021-06-07 ENCOUNTER — Ambulatory Visit (HOSPITAL_COMMUNITY)
Admission: RE | Admit: 2021-06-07 | Discharge: 2021-06-07 | Disposition: A | Discharge status: Home / Self Care | Payer: Medicare Other | Attending: Cardiovascular Disease | Admitting: Cardiovascular Disease

## 2021-06-07 ENCOUNTER — Other Ambulatory Visit: Payer: Self-pay

## 2021-06-07 ENCOUNTER — Encounter (HOSPITAL_COMMUNITY)
Admission: RE | Disposition: A | Discharge status: Home / Self Care | Payer: Self-pay | Source: Home / Self Care | Attending: Cardiovascular Disease

## 2021-06-07 DIAGNOSIS — E10621 Type 1 diabetes mellitus with foot ulcer: Secondary | ICD-10-CM | POA: Diagnosis not present

## 2021-06-07 DIAGNOSIS — I70212 Atherosclerosis of native arteries of extremities with intermittent claudication, left leg: Secondary | ICD-10-CM | POA: Diagnosis not present

## 2021-06-07 DIAGNOSIS — Z951 Presence of aortocoronary bypass graft: Secondary | ICD-10-CM | POA: Diagnosis not present

## 2021-06-07 DIAGNOSIS — Z87891 Personal history of nicotine dependence: Secondary | ICD-10-CM | POA: Diagnosis not present

## 2021-06-07 DIAGNOSIS — I872 Venous insufficiency (chronic) (peripheral): Secondary | ICD-10-CM | POA: Diagnosis not present

## 2021-06-07 DIAGNOSIS — I70245 Atherosclerosis of native arteries of left leg with ulceration of other part of foot: Secondary | ICD-10-CM | POA: Insufficient documentation

## 2021-06-07 DIAGNOSIS — E785 Hyperlipidemia, unspecified: Secondary | ICD-10-CM | POA: Diagnosis not present

## 2021-06-07 DIAGNOSIS — I10 Essential (primary) hypertension: Secondary | ICD-10-CM | POA: Diagnosis not present

## 2021-06-07 DIAGNOSIS — Z7901 Long term (current) use of anticoagulants: Secondary | ICD-10-CM | POA: Diagnosis not present

## 2021-06-07 DIAGNOSIS — E1051 Type 1 diabetes mellitus with diabetic peripheral angiopathy without gangrene: Secondary | ICD-10-CM | POA: Insufficient documentation

## 2021-06-07 DIAGNOSIS — I252 Old myocardial infarction: Secondary | ICD-10-CM | POA: Diagnosis not present

## 2021-06-07 DIAGNOSIS — Z7982 Long term (current) use of aspirin: Secondary | ICD-10-CM | POA: Diagnosis not present

## 2021-06-07 DIAGNOSIS — L97529 Non-pressure chronic ulcer of other part of left foot with unspecified severity: Secondary | ICD-10-CM | POA: Diagnosis not present

## 2021-06-07 DIAGNOSIS — Z9641 Presence of insulin pump (external) (internal): Secondary | ICD-10-CM | POA: Diagnosis not present

## 2021-06-07 DIAGNOSIS — Z794 Long term (current) use of insulin: Secondary | ICD-10-CM | POA: Diagnosis not present

## 2021-06-07 DIAGNOSIS — Z79899 Other long term (current) drug therapy: Secondary | ICD-10-CM | POA: Diagnosis not present

## 2021-06-07 DIAGNOSIS — I2510 Atherosclerotic heart disease of native coronary artery without angina pectoris: Secondary | ICD-10-CM | POA: Diagnosis not present

## 2021-06-07 DIAGNOSIS — I6521 Occlusion and stenosis of right carotid artery: Secondary | ICD-10-CM | POA: Insufficient documentation

## 2021-06-07 HISTORY — PX: PERIPHERAL VASCULAR ATHERECTOMY: CATH118256

## 2021-06-07 HISTORY — PX: ABDOMINAL AORTOGRAM W/LOWER EXTREMITY: CATH118223

## 2021-06-07 LAB — GLUCOSE, CAPILLARY
Glucose-Capillary: 121 mg/dL — ABNORMAL HIGH (ref 70–99)
Glucose-Capillary: 136 mg/dL — ABNORMAL HIGH (ref 70–99)

## 2021-06-07 LAB — POCT ACTIVATED CLOTTING TIME
Activated Clotting Time: 287 seconds
Activated Clotting Time: 233 seconds

## 2021-06-07 SURGERY — ABDOMINAL AORTOGRAM W/LOWER EXTREMITY
Anesthesia: LOCAL

## 2021-06-07 MED ORDER — HYDRALAZINE HCL 20 MG/ML IJ SOLN
INTRAMUSCULAR | Status: DC | PRN
Start: 2021-06-07 — End: 2021-06-07
  Administered 2021-06-07: 10 mg via INTRAVENOUS

## 2021-06-07 MED ORDER — LABETALOL HCL 5 MG/ML IV SOLN: 10.0000 mg | INTRAVENOUS | Status: DC | PRN

## 2021-06-07 MED ORDER — NITROGLYCERIN 1 MG/10 ML FOR IR/CATH LAB
INTRA_ARTERIAL | Status: DC | PRN
  Administered 2021-06-07: 400 ug via INTRA_ARTERIAL

## 2021-06-07 MED ORDER — CLOPIDOGREL BISULFATE 300 MG PO TABS
ORAL_TABLET | ORAL | Status: DC | PRN
Start: 2021-06-07 — End: 2021-06-07
  Administered 2021-06-07: 300 mg via ORAL

## 2021-06-07 MED ORDER — HEPARIN (PORCINE) IN NACL 1000-0.9 UT/500ML-% IV SOLN
INTRAVENOUS | Status: AC
  Filled 2021-06-07: qty 1000

## 2021-06-07 MED ORDER — HEPARIN (PORCINE) IN NACL 1000-0.9 UT/500ML-% IV SOLN
INTRAVENOUS | Status: DC | PRN
  Administered 2021-06-07 (×2): 500 mL

## 2021-06-07 MED ORDER — NITROGLYCERIN IN D5W 200-5 MCG/ML-% IV SOLN
INTRAVENOUS | Status: AC
  Filled 2021-06-07: qty 250

## 2021-06-07 MED ORDER — IODIXANOL 320 MG/ML IV SOLN
INTRAVENOUS | Status: DC | PRN
Start: 2021-06-07 — End: 2021-06-07
  Administered 2021-06-07: 180 mL via INTRA_ARTERIAL

## 2021-06-07 MED ORDER — SODIUM CHLORIDE 0.9% FLUSH: 3.0000 mL | Freq: Two times a day (BID) | INTRAVENOUS | Status: DC

## 2021-06-07 MED ORDER — VIPERSLIDE LUBRICANT OPTIME: TOPICAL | Status: DC | PRN

## 2021-06-07 MED ORDER — FENTANYL CITRATE (PF) 100 MCG/2ML IJ SOLN
INTRAMUSCULAR | Status: DC | PRN
  Administered 2021-06-07 (×3): 25 ug via INTRAVENOUS

## 2021-06-07 MED ORDER — CLOPIDOGREL BISULFATE 75 MG PO TABS: 75.0000 mg | ORAL_TABLET | Freq: Every day | ORAL | 6 refills | Status: DC

## 2021-06-07 MED ORDER — HYDRALAZINE HCL 20 MG/ML IJ SOLN: 5.0000 mg | INTRAMUSCULAR | Status: DC | PRN

## 2021-06-07 MED ORDER — CLOPIDOGREL BISULFATE 300 MG PO TABS
ORAL_TABLET | ORAL | Status: AC
  Filled 2021-06-07: qty 1

## 2021-06-07 MED ORDER — ASPIRIN 81 MG PO CHEW: 81.0000 mg | CHEWABLE_TABLET | ORAL | Status: DC

## 2021-06-07 MED ORDER — SODIUM CHLORIDE 0.9 % IV SOLN: 250.0000 mL | INTRAVENOUS | Status: DC | PRN

## 2021-06-07 MED ORDER — MIDAZOLAM HCL 2 MG/2ML IJ SOLN
INTRAMUSCULAR | Status: AC
  Filled 2021-06-07: qty 2

## 2021-06-07 MED ORDER — ONDANSETRON HCL 4 MG/2ML IJ SOLN: 4.0000 mg | Freq: Four times a day (QID) | INTRAMUSCULAR | Status: DC | PRN

## 2021-06-07 MED ORDER — MIDAZOLAM HCL 2 MG/2ML IJ SOLN
INTRAMUSCULAR | Status: DC | PRN
  Administered 2021-06-07 (×3): 1 mg via INTRAVENOUS

## 2021-06-07 MED ORDER — HYDRALAZINE HCL 20 MG/ML IJ SOLN
INTRAMUSCULAR | Status: AC
  Filled 2021-06-07: qty 1

## 2021-06-07 MED ORDER — SODIUM CHLORIDE 0.9 % WEIGHT BASED INFUSION: 1.0000 mL/kg/h | INTRAVENOUS | Status: DC

## 2021-06-07 MED ORDER — HEPARIN SODIUM (PORCINE) 1000 UNIT/ML IJ SOLN
INTRAMUSCULAR | Status: DC | PRN
Start: 2021-06-07 — End: 2021-06-07
  Administered 2021-06-07: 10000 [IU] via INTRAVENOUS

## 2021-06-07 MED ORDER — LIDOCAINE HCL (PF) 1 % IJ SOLN
INTRAMUSCULAR | Status: DC | PRN
  Administered 2021-06-07: 20 mL

## 2021-06-07 MED ORDER — LIDOCAINE HCL (PF) 1 % IJ SOLN
INTRAMUSCULAR | Status: AC
  Filled 2021-06-07: qty 30

## 2021-06-07 MED ORDER — SODIUM CHLORIDE 0.9% FLUSH: 3.0000 mL | INTRAVENOUS | Status: DC | PRN

## 2021-06-07 MED ORDER — VERAPAMIL HCL 2.5 MG/ML IV SOLN
INTRAVENOUS | Status: AC
  Filled 2021-06-07: qty 2

## 2021-06-07 MED ORDER — HEPARIN SODIUM (PORCINE) 1000 UNIT/ML IJ SOLN
INTRAMUSCULAR | Status: AC
  Filled 2021-06-07: qty 10

## 2021-06-07 MED ORDER — FENTANYL CITRATE (PF) 100 MCG/2ML IJ SOLN
INTRAMUSCULAR | Status: AC
Start: 2021-06-07 — End: ?
  Filled 2021-06-07: qty 2

## 2021-06-07 MED ORDER — SODIUM CHLORIDE 0.9 % IV SOLN: INTRAVENOUS | Status: DC

## 2021-06-07 MED ORDER — SODIUM CHLORIDE 0.9 % WEIGHT BASED INFUSION
3.0000 mL/kg/h | INTRAVENOUS | Status: AC
  Administered 2021-06-07: 3 mL/kg/h via INTRAVENOUS

## 2021-06-07 MED ORDER — ACETAMINOPHEN 325 MG PO TABS: 650.0000 mg | ORAL_TABLET | ORAL | Status: DC | PRN

## 2021-06-07 SURGICAL SUPPLY — 29 items
BALLN JADE .018 5.0 X 40 (BALLOONS) ×3
BALLOON JADE .018 5.0 X 40 (BALLOONS) IMPLANT
CATH ANGIO 5F PIGTAIL 65CM (CATHETERS) ×1 IMPLANT
CATH CROSS OVER TEMPO 5F (CATHETERS) ×1 IMPLANT
CATH QUICKCROSS .018X135CM (MICROCATHETER) ×1 IMPLANT
CATH STRAIGHT 5FR 65CM (CATHETERS) ×1 IMPLANT
CLOSURE PERCLOSE PROSTYLE (VASCULAR PRODUCTS) ×1 IMPLANT
DCB RANGER 6.0X80 135 (BALLOONS) IMPLANT
DEVICE EMBOSHIELD NAV6 4.0-7.0 (FILTER) ×2 IMPLANT
DIAMONDBACK SOLID OAS 2.0MM (CATHETERS) ×3
KIT ENCORE 26 ADVANTAGE (KITS) ×1 IMPLANT
KIT MICROPUNCTURE NIT STIFF (SHEATH) ×1 IMPLANT
KIT PV (KITS) ×3 IMPLANT
LUBRICANT VIPERSLIDE CORONARY (MISCELLANEOUS) ×1 IMPLANT
RANGER DCB 6.0X80 135 (BALLOONS) ×3
SHEATH PINNACLE 5F 10CM (SHEATH) ×1 IMPLANT
SHEATH PINNACLE 7F 10CM (SHEATH) ×1 IMPLANT
SHEATH PINNACLE MP 7F 45CM (SHEATH) ×1 IMPLANT
SHEATH PROBE COVER 6X72 (BAG) ×1 IMPLANT
STOPCOCK MORSE 400PSI 3WAY (MISCELLANEOUS) ×1 IMPLANT
SYR MEDRAD MARK 7 150ML (SYRINGE) ×3 IMPLANT
SYSTEM DIMNDBCK SLD OAS 2.0MM (CATHETERS) IMPLANT
TAPE SHOOT N SEE (TAPE) ×1 IMPLANT
TRANSDUCER W/STOPCOCK (MISCELLANEOUS) ×3 IMPLANT
TRAY PV CATH (CUSTOM PROCEDURE TRAY) ×3 IMPLANT
TUBING CIL FLEX 10 FLL-RA (TUBING) ×1 IMPLANT
WIRE HI TORQ VERSACORE-J 145CM (WIRE) ×1 IMPLANT
WIRE RUNTHROUGH .014X300CM (WIRE) ×1 IMPLANT
WIRE VIPER ADVANCE .017X335CM (WIRE) ×1 IMPLANT

## 2021-06-07 NOTE — Progress Notes (Signed)
Client c/o left calf pain 4/10 ; Dr Fletcher Anon notified and ok to d/c home no new orders ?

## 2021-06-07 NOTE — Progress Notes (Signed)
Client up and walked and tolerated well; right groin stable, no bleeding or hematoma 

## 2021-06-07 NOTE — Interval H&P Note (Signed)
History and Physical Interval Note: ? ?06/07/2021 ?9:40 AM ? ?Derek Hogan  has presented today for surgery, with the diagnosis of pad.  The various methods of treatment have been discussed with the patient and family. After consideration of risks, benefits and other options for treatment, the patient has consented to  Procedure(s): ?ABDOMINAL AORTOGRAM W/LOWER EXTREMITY (N/A) as a surgical intervention.  The patient's history has been reviewed, patient examined, no change in status, stable for surgery.  I have reviewed the patient's chart and labs.  Questions were answered to the patient's satisfaction.   ? ? ?Kathlyn Sacramento ? ? ?

## 2021-06-08 ENCOUNTER — Encounter (HOSPITAL_COMMUNITY): Payer: Self-pay | Admitting: Cardiovascular Disease

## 2021-06-08 ENCOUNTER — Telehealth: Payer: Self-pay

## 2021-06-08 ENCOUNTER — Telehealth: Payer: Self-pay | Admitting: Cardiovascular Disease

## 2021-06-08 DIAGNOSIS — I739 Peripheral vascular disease, unspecified: Secondary | ICD-10-CM

## 2021-06-08 NOTE — Telephone Encounter (Signed)
PV orders placed for post procedure testing. Msg fwd to scheduling to schedule. ?

## 2021-06-08 NOTE — Telephone Encounter (Signed)
-----   Message from Wellington Hampshire, MD sent at 06/07/2021 12:34 PM EDT ----- ?Schedule ABI and LEA within 2 weeks and follow-up after. ? ?

## 2021-06-08 NOTE — Telephone Encounter (Signed)
Patient made aware of Dr. Arida's response and recommendation. Patient verbalized understanding and voiced appreciation for the call.  

## 2021-06-08 NOTE — Telephone Encounter (Signed)
PV testing scheduled 06/14/21. ?

## 2021-06-08 NOTE — Telephone Encounter (Signed)
Patient would like to discuss his Plavix '75mg'$  He states this is a new prescription. He states he thinks his Eliquis he has taken in the past was a different dosage. Patient states he is supposed to get his foot worked on, "which started all of this" and ask if he should keep that appt with his foot doctor. Please call to discuss.

## 2021-06-08 NOTE — Telephone Encounter (Signed)
Per Dr.Arida, ? ?Yes I stopped Xarelto.  Continue aspirin and Plavix.  He can see his podiatrist but should not have any work done on his feet until we do his postprocedure ABI and LEA ?

## 2021-06-12 ENCOUNTER — Encounter (HOSPITAL_COMMUNITY): Payer: Medicare Other

## 2021-06-12 ENCOUNTER — Encounter: Payer: Self-pay | Admitting: Cardiovascular Disease

## 2021-06-12 DIAGNOSIS — R0602 Shortness of breath: Secondary | ICD-10-CM

## 2021-06-13 ENCOUNTER — Ambulatory Visit (INDEPENDENT_AMBULATORY_CARE_PROVIDER_SITE_OTHER): Payer: Medicare Other | Admitting: Cardiovascular Disease

## 2021-06-13 ENCOUNTER — Ambulatory Visit: Payer: Medicare Other | Admitting: Cardiovascular Disease

## 2021-06-13 ENCOUNTER — Encounter: Payer: Self-pay | Admitting: Cardiovascular Disease

## 2021-06-13 ENCOUNTER — Other Ambulatory Visit
Admission: RE | Admit: 2021-06-13 | Discharge: 2021-06-13 | Disposition: A | Discharge status: Home / Self Care | Payer: Medicare Other | Attending: Cardiovascular Disease | Admitting: Cardiovascular Disease

## 2021-06-13 DIAGNOSIS — E785 Hyperlipidemia, unspecified: Secondary | ICD-10-CM

## 2021-06-13 DIAGNOSIS — I10 Essential (primary) hypertension: Secondary | ICD-10-CM | POA: Diagnosis not present

## 2021-06-13 DIAGNOSIS — I739 Peripheral vascular disease, unspecified: Secondary | ICD-10-CM | POA: Diagnosis not present

## 2021-06-13 DIAGNOSIS — R0602 Shortness of breath: Secondary | ICD-10-CM | POA: Diagnosis present

## 2021-06-13 DIAGNOSIS — I6521 Occlusion and stenosis of right carotid artery: Secondary | ICD-10-CM

## 2021-06-13 DIAGNOSIS — I2510 Atherosclerotic heart disease of native coronary artery without angina pectoris: Secondary | ICD-10-CM | POA: Diagnosis not present

## 2021-06-13 DIAGNOSIS — I779 Disorder of arteries and arterioles, unspecified: Secondary | ICD-10-CM

## 2021-06-13 DIAGNOSIS — I872 Venous insufficiency (chronic) (peripheral): Secondary | ICD-10-CM

## 2021-06-13 LAB — CBC WITH DIFFERENTIAL/PLATELET
Abs Immature Granulocytes: 0.07 10*3/uL (ref 0.00–0.07)
Basophils Absolute: 0.1 10*3/uL (ref 0.0–0.1)
Basophils Relative: 0 %
Eosinophils Absolute: 0.1 10*3/uL (ref 0.0–0.5)
Eosinophils Relative: 1 %
HCT: 34 % — ABNORMAL LOW (ref 39.0–52.0)
Hemoglobin: 10.8 g/dL — ABNORMAL LOW (ref 13.0–17.0)
Immature Granulocytes: 0 %
Lymphocytes Relative: 16 %
Lymphs Abs: 2.8 10*3/uL (ref 0.7–4.0)
MCH: 28.6 pg (ref 26.0–34.0)
MCHC: 31.8 g/dL (ref 30.0–36.0)
MCV: 89.9 fL (ref 80.0–100.0)
Monocytes Absolute: 1.6 10*3/uL — ABNORMAL HIGH (ref 0.1–1.0)
Monocytes Relative: 10 %
Neutro Abs: 12.4 10*3/uL — ABNORMAL HIGH (ref 1.7–7.7)
Neutrophils Relative %: 73 %
Platelets: 245 10*3/uL (ref 150–400)
RBC: 3.78 MIL/uL — ABNORMAL LOW (ref 4.22–5.81)
RDW: 14.1 % (ref 11.5–15.5)
WBC: 17 10*3/uL — ABNORMAL HIGH (ref 4.0–10.5)
nRBC: 0 % (ref 0.0–0.2)

## 2021-06-13 LAB — BASIC METABOLIC PANEL
Anion gap: 5 (ref 5–15)
BUN: 19 mg/dL (ref 8–23)
CO2: 25 mmol/L (ref 22–32)
Calcium: 8.7 mg/dL — ABNORMAL LOW (ref 8.9–10.3)
Chloride: 105 mmol/L (ref 98–111)
Creatinine, Ser: 0.98 mg/dL (ref 0.61–1.24)
GFR, Estimated: >60 mL/min (ref >60)
Glucose, Bld: 206 mg/dL — ABNORMAL HIGH (ref 70–99)
Potassium: 4.5 mmol/L (ref 3.5–5.1)
Sodium: 135 mmol/L (ref 135–145)

## 2021-06-13 LAB — TROPONIN I (HIGH SENSITIVITY): Troponin I (High Sensitivity): 14 ng/L (ref <18)

## 2021-06-13 LAB — BRAIN NATRIURETIC PEPTIDE: B Natriuretic Peptide: 165.4 pg/mL — ABNORMAL HIGH (ref 0.0–100.0)

## 2021-06-13 MED ORDER — TORSEMIDE 20 MG PO TABS: 20.0000 mg | ORAL_TABLET | Freq: Every day | ORAL | 2 refills | Status: DC

## 2021-06-13 NOTE — Telephone Encounter (Signed)
Patient made aware of lab results and Dr. Tyrell Antonio recommendation. ?Patient added to Dr. Fletcher Anon' schedule today at 3 pm. ?

## 2021-06-13 NOTE — Patient Instructions (Signed)
Medication Instructions:  ?Your physician has recommended you make the following change in your medication:  ? ?CHANGE Torsemide to 20 mg daily ? ?*If you need a refill on your cardiac medications before your next appointment, please call your pharmacy* ? ? ?Lab Work: ?Your physician recommends that you return for lab work (bmp) in: 1 week. ? ?Please have your lab drawn at the [facility]. No appt needed. Lab hours are Mon-Fri 7am-6pm ? ?If you have labs (blood work) drawn today and your tests are completely normal, you will receive your results only by: ?MyChart Message (if you have MyChart) OR ?A paper copy in the mail ?If you have any lab test that is abnormal or we need to change your treatment, we will call you to review the results. ? ? ?Testing/Procedures: ?As planned ? ? ?Follow-Up: ?At [facility], you and your health needs are our priority.  As part of our continuing mission to provide you with exceptional heart care, we have created designated Provider Care Teams.  These Care Teams include your primary Cardiologist (physician) and Advanced Practice Providers (APPs -  Physician Assistants and Nurse Practitioners) who all work together to provide you with the care you need, when you need it. ? ?We recommend signing up for the patient portal called "MyChart".  Sign up information is provided on this After Visit Summary.  MyChart is used to connect with patients for Virtual Visits (Telemedicine).  Patients are able to view lab/test results, encounter notes, upcoming appointments, etc.  Non-urgent messages can be sent to your provider as well.   ?To learn more about what you can do with MyChart, go to [email or web address].   ? ?Your next appointment:   ?As planned 07/05/21 @ 9:15 am ? ?The format for your next appointment:   ?In Person ? ?Provider:   ?Christell Faith, PA-C{ ? ? ? ? ?Other Instructions ?N/A ? ?Important Information About Sugar ? ? ? ? ? ? ?

## 2021-06-13 NOTE — Progress Notes (Signed)
?  ?Cardiology Office Note ? ? ?Date:  06/13/2021  ? ?ID:  Derek Hogan, DOB 1945-05-24, MRN [id number] ? ?PCP:  Kirk Ruths, MD  ?Cardiologist:  Dr. Fletcher Anon ? ?Chief Complaint  ?Patient presents with  ? Other  ?  C/o pain below and behind the knee, lightheadedness, edema leg, tired and weak. Meds reviewed verbally with pt. ?  ? ? ?  ?History of Present Illness: ?Derek Hogan is a 76 y.o. male who presents for a follow-up visit regarding coronary artery disease and peripheral arterial disease.  He has known history of CAD S/P previous inferior wall myocardial infarction followed by CABG in 1996. He had questionable TIA in March 2016. He quit smoking in 1996 after CABG. He has prolonged history of diabetes currently on insulin pump, Carotid disease status post right carotid endarterectomy, hyperlipidemia and hypertension. ?He is known to have peripheral arterial disease.  He had drug-coated balloon angioplasty followed by self-expanding stent placement to the proximal right popliteal artery for severe claudication in November 2016. ? ?He had a Lexiscan Myoview in May 2021 which showed no evidence of ischemia.   ?He is known to have right leg edema due to suspected venous insufficiency.  He had lower extremity venous Doppler in August 2021 which showed no evidence of DVT. ? ?Echocardiogram in August 2022 showed normal LV systolic function with grade 1 diastolic dysfunction and no significant valvular abnormalities.   ? ?He had sleep study done at the [place] which showed mild obstructive sleep apnea.   ? ?He was seen recently for nonhealing ulceration on the left big toe. ? ?I proceeded with angiography last week which showed no significant aortoiliac disease.  On the left side, there was moderately calcified common femoral artery stenosis, mild nonobstructive SFA disease and severe calcified proximal popliteal artery disease with three-vessel runoff below the knee.  I performed successful orbital  atherectomy and drug-coated balloon angioplasty to the left popliteal artery.  I had some difficulty retrieving the distal protection device due to deformation of the sheath but I was ultimately able to remove it. ? ?The patient reported some discomfort in the medial aspect of the left knee after the procedure but overall was mild.  He reports increased swelling in the left leg and also has not been feeling well overall with increased shortness of breath and fatigue.  I did labs on him today which showed normal troponin with mildly elevated BNP.  Renal function was normal but CBC did show mild leukocytosis at 17,000 mild drop in hemoglobin to 10.8.  He has no hematoma. ? ?Past Medical History:  ?Diagnosis Date  ? CAD, ARTERY BYPASS GRAFT   ? a. 1996 s/p CABG x 4 (LIMA->LAD, VG->D1, VG->OM3, VG->RPDA; b. 12/2016 [facility]): EF 47%, anteroapical and inf infarcts w/ anteroapical peri-infarct ischemia; c. 01/2017: Cath: LM 51md, LAD 100p/m, D1 90ost, 70p, LCX 80ost/p, 1019mRCA 4040m5d, RPDA 95ost, VG->RPDA 30p/m, VG->OM3 nl, LIMA->LAD nl, VG->D1 nl.  ? Carotid arterial disease ([place] ? a. s/p remote R CEA (Dr. GreDrucie Opitzb. 01/2018 Carotid U/S: RICA 1-36-83%CCA <50<41%ICA 1-39-62%/u 12 mos.  ? Dyslipidemia   ? ERECTILE DYSFUNCTION 06/09/2008  ? Erectile dysfunction   ? GERD (gastroesophageal reflux disease)   ? Heart attack ([facility]  ? HYPERLIPIDEMIA-MIXED 06/09/2008  ? Hypertension   ? HYPERTENSION, UNSPECIFIED 06/09/2008  ? Orthostatic hypotension   ? PVD (peripheral vascular disease) ([facility] ? a. 02/2014 s/p DBA/stent to  prox R popliteal; b.  08/2017 Vasc u/s/TBI: Patent mid R pop stent. LSFA 30-40%. TBI: R 0.79, L 0.51 - no significant change, f/u 1 yr.  ? Transient ischemic attack (TIA)   ? Type I diabetes mellitus ([place]) dx'd 08/20/1966  ? UNSPECIFIED PERIPHERAL VASCULAR DISEASE 10/06/2008  ? Viral gastroenteritis   ? 04/19/2010 improved  ? Weakness   ? ? ?Past Surgical History:  ?Procedure Laterality Date  ?  ABDOMINAL AORTOGRAM W/LOWER EXTREMITY N/A 06/07/2021  ? Procedure: ABDOMINAL AORTOGRAM W/LOWER EXTREMITY;  Surgeon: Wellington Hampshire, MD;  Location: [place] CV [facility];  Service: Cardiovascular;  Laterality: N/A;  ? [place]; ~ 2010  ? CAROTID ENDARTERECTOMY Right   ? CATARACT EXTRACTION, BILATERAL Bilateral   ? COLONOSCOPY WITH PROPOFOL N/A 06/07/2020  ? Procedure: COLONOSCOPY WITH PROPOFOL;  Surgeon: Lesly Rubenstein, MD;  Location[facility];  Service: Endoscopy;  Laterality: N/A;  ? CORONARY ANGIOPLASTY    ? CORONARY ARTERY BYPASS GRAFT  1996  ? "CABG X4"  ? ESOPHAGOGASTRODUODENOSCOPY N/A 06/07/2020  ? Procedure: ESOPHAGOGASTRODUODENOSCOPY (EGD);  Surgeon: Lesly Rubenstein, MD;  Location: [facility] ENDOSCOPY;  Service: Endoscopy;  Laterality: N/A;  ? LEFT HEART CATH AND CORS/GRAFTS ANGIOGRAPHY N/A 01/11/2017  ? Procedure: LEFT HEART CATH AND CORS/GRAFTS ANGIOGRAPHY;  Surgeon: Jolaine Artist, MD;  Location: [place] [facility] LAB;  Service: Cardiovascular;  Laterality: N/A;  ? MOHS SURGERY    ? PERIPHERAL VASCULAR ATHERECTOMY  06/07/2021  ? Procedure: PERIPHERAL VASCULAR ATHERECTOMY;  Surgeon: Wellington Hampshire, MD;  Location: [place] CV LAB;  Service: Cardiovascular;;  ? PERIPHERAL VASCULAR CATHETERIZATION N/A 12/15/2014  ? Procedure: Abdominal Aortogram;  Surgeon: Wellington Hampshire, MD;  Location: [place] CV LAB;  Service: Cardiovascular;  Laterality: N/A;  ? REFRACTIVE SURGERY Right   ? "before cataract OR"  ? ? ? ?Current Outpatient Medications  ?Medication Sig Dispense Refill  ? aspirin 81 MG tablet Take 81 mg by mouth at bedtime.    ? atorvastatin (LIPITOR) 80 MG tablet Take 1 tablet (80 mg total) by mouth daily.    ? Calcium-Magnesium-Zinc (CAL-MAG-ZINC PO) Take 1 tablet by mouth daily.    ? carvedilol (COREG) 3.125 MG tablet Take 3.125 mg by mouth 2 (two) times daily with a meal.    ? clopidogrel (PLAVIX) 75 MG tablet Take 1 tablet (75 mg total) by mouth daily. 30 tablet 6  ?  ezetimibe (ZETIA) 10 MG tablet Take 10 mg by mouth daily.    ? glucagon 1 MG injection 1 mg once as needed (low blood glucose).    ? hydrOXYzine (ATARAX/VISTARIL) 25 MG tablet Take 1 tablet by mouth daily as needed for anxiety.    ? insulin aspart (NOVOLOG) 100 unit/mL injection Inject into the skin continuous. INSULIN PUMP    ? losartan (COZAAR) 100 MG tablet Take 100 mg by mouth daily.    ? methocarbamol (ROBAXIN) 500 MG tablet Take 500 mg by mouth daily as needed for muscle spasms.    ? nitroGLYCERIN (NITROSTAT) 0.4 MG SL tablet Place 0.4 mg under the tongue every 5 (five) minutes as needed for chest pain.     ? omeprazole (PRILOSEC) 20 MG capsule Take 20 mg by mouth every morning.     ? sertraline (ZOLOFT) 100 MG tablet Take 100 mg by mouth daily.    ? torsemide (DEMADEX) 20 MG tablet Take 1 tablet (20 mg total) by mouth daily as needed. (Patient taking differently: Take 20 mg by mouth every other  day.) 60 tablet 3  ? traZODone (DESYREL) 50 MG tablet Take 25 mg by mouth at bedtime.    ? carvedilol (COREG) 6.25 MG tablet Take 1 tablet (6.25 mg total) by mouth 2 (two) times daily. (Patient not taking: Reported on 06/13/2021) 180 tablet 3  ? ?No current facility-administered medications for this visit.  ? ? ?Allergies:   Simvastatin and Lisinopril  ? ? ?Social History:  The patient  reports that he quit smoking about 27 years ago. His smoking use included cigarettes. He has a 105.00 pack-year smoking history. He has never used smokeless tobacco. He reports that he does not currently use alcohol. He reports that he does not use drugs.  ? ?Family History:  The patient's family history includes Coronary artery disease in his father and mother; Diabetes in his father and mother; Stroke in an other family member.  ? ? ?ROS:  Please see the history of present illness.   Otherwise, review of systems are positive for none.   All other systems are reviewed and negative.  ? ? ?PHYSICAL EXAM: ?VS:  BP 135/65 (BP Location: Left  Arm, Patient Position: Sitting, Cuff Size: Normal)   Pulse 80   Ht 6' (1.829 m)   Wt 264 lb 4 oz (119.9 kg)   SpO2 96%   BMI 35.84 kg/m?  , BMI Body mass index is 35.84 kg/m?. ?GEN: Well nourished, well developed, in no a

## 2021-06-13 NOTE — Telephone Encounter (Signed)
-----   Message from Wellington Hampshire, MD sent at 06/13/2021 12:56 PM EDT ----- ?Mild drop in hemoglobin.  In addition, his white cell count has increased which might indicate some underlying infection.  I should see him this afternoon.  Add him to my schedule  ? ?

## 2021-06-14 ENCOUNTER — Ambulatory Visit (HOSPITAL_COMMUNITY)
Admission: RE | Admit: 2021-06-14 | Discharge: 2021-06-14 | Disposition: A | Discharge status: Home / Self Care | Payer: Medicare Other | Source: Ambulatory Visit | Attending: Cardiovascular Disease | Admitting: Cardiovascular Disease

## 2021-06-14 ENCOUNTER — Ambulatory Visit: Payer: Medicare Other | Admitting: Podiatry

## 2021-06-14 DIAGNOSIS — I739 Peripheral vascular disease, unspecified: Secondary | ICD-10-CM | POA: Diagnosis present

## 2021-06-15 ENCOUNTER — Telehealth: Payer: Self-pay | Admitting: Cardiovascular Disease

## 2021-06-15 NOTE — Telephone Encounter (Signed)
PV results awaiting Dr. Tyrell Antonio review and signature. ?

## 2021-06-15 NOTE — Telephone Encounter (Signed)
Pt calling to get lab and ultrasound results. Pt states that he has been feeling "off" and would like to know if anything was found on test. Please advsie ?

## 2021-06-15 NOTE — Telephone Encounter (Signed)
The Doppler was fine.  The blood flow to his left leg improved. ?

## 2021-06-16 ENCOUNTER — Encounter: Payer: Self-pay | Admitting: Cardiovascular Disease

## 2021-06-16 DIAGNOSIS — R0602 Shortness of breath: Secondary | ICD-10-CM

## 2021-06-16 NOTE — Telephone Encounter (Signed)
See 06/12/21 mychart encounter for documentation. ?

## 2021-06-20 ENCOUNTER — Other Ambulatory Visit
Admission: RE | Admit: 2021-06-20 | Discharge: 2021-06-20 | Disposition: A | Discharge status: Home / Self Care | Payer: Medicare Other | Attending: Cardiovascular Disease | Admitting: Cardiovascular Disease

## 2021-06-20 DIAGNOSIS — I739 Peripheral vascular disease, unspecified: Secondary | ICD-10-CM | POA: Diagnosis present

## 2021-06-20 LAB — BASIC METABOLIC PANEL
Anion gap: 8 (ref 5–15)
BUN: 24 mg/dL — ABNORMAL HIGH (ref 8–23)
CO2: 26 mmol/L (ref 22–32)
Calcium: 9.1 mg/dL (ref 8.9–10.3)
Chloride: 101 mmol/L (ref 98–111)
Creatinine, Ser: 1.17 mg/dL (ref 0.61–1.24)
GFR, Estimated: >60 mL/min (ref >60)
Glucose, Bld: 273 mg/dL — ABNORMAL HIGH (ref 70–99)
Potassium: 4.9 mmol/L (ref 3.5–5.1)
Sodium: 135 mmol/L (ref 135–145)

## 2021-06-24 ENCOUNTER — Encounter: Payer: Self-pay | Admitting: Cardiovascular Disease

## 2021-06-27 ENCOUNTER — Other Ambulatory Visit: Payer: Self-pay | Admitting: Specialist

## 2021-06-27 DIAGNOSIS — J849 Interstitial pulmonary disease, unspecified: Secondary | ICD-10-CM

## 2021-06-28 ENCOUNTER — Ambulatory Visit: Payer: Medicare Other | Admitting: Nurse Practitioner

## 2021-06-28 ENCOUNTER — Ambulatory Visit (INDEPENDENT_AMBULATORY_CARE_PROVIDER_SITE_OTHER): Payer: Medicare Other | Admitting: Podiatry

## 2021-06-28 DIAGNOSIS — L600 Ingrowing nail: Secondary | ICD-10-CM

## 2021-06-28 DIAGNOSIS — I739 Peripheral vascular disease, unspecified: Secondary | ICD-10-CM

## 2021-06-28 MED ORDER — NEOMYCIN-POLYMYXIN-HC 1 % OT SOLN: OTIC | 0 refills | Status: DC

## 2021-06-28 NOTE — Patient Instructions (Signed)

## 2021-06-28 NOTE — Progress Notes (Signed)
  Subjective:  Patient ID: Derek Hogan, male    DOB: 12-03-1945,  MRN: [id number]  Chief Complaint  Patient presents with   Ingrown Toenail     L ingrown toe nail infected    76 y.o. male returns for follow-up with the above complaint. History confirmed with patient.  Still having quite a bit of pain in the right hallux, he recently underwent endovascular intervention with Dr. Fletcher Anon which was successful in reducing stenosis in his popliteal artery.  He is ready to get the toenail removed  Objective:  Physical Exam: No ulceration now there is left ingrown medial nail.  Difficult to palpate pedal pulses.  Capillary fill time is improved to prior exam, foot is warm to touch.  Assessment:   1. Ingrowing left great toenail   2. PAD (peripheral artery disease) ([place])      Plan:  Patient was evaluated and treated and all questions answered.    Ingrown Nail, left -Patient elects to proceed with minor surgery to remove ingrown toenail today. Consent reviewed and signed by patient.  We discussed the risks of infection and nonhealing of the procedure -Ingrown nail excised. See procedure note. -Educated on post-procedure care including soaking. Written instructions provided and reviewed. -Patient to follow up in 2 weeks for nail check.  Procedure: Excision of Ingrown Toenail Location: Left 1st toe medial nail borders. Anesthesia: Lidocaine 1% plain; 1.5 mL and Marcaine 0.5% plain; 1.5 mL, digital block. Skin Prep: Betadine. Dressing: Silvadene; telfa; dry, sterile, compression dressing. Technique: Following skin prep, the toe was exsanguinated and a tourniquet was secured at the base of the toe. The affected nail border was freed, split with a nail splitter, and excised. Chemical matrixectomy was then performed with phenol and irrigated out with alcohol. The tourniquet was then removed and sterile dressing applied. Disposition: Patient tolerated procedure well. Patient to return in 2  weeks for follow-up.    Return in about 2 weeks (around 07/12/2021) for nail re-check.

## 2021-06-29 ENCOUNTER — Other Ambulatory Visit
Admission: RE | Admit: 2021-06-29 | Discharge: 2021-06-29 | Disposition: A | Discharge status: Home / Self Care | Payer: Medicare Other | Attending: Nurse Practitioner | Admitting: Nurse Practitioner

## 2021-06-29 ENCOUNTER — Encounter: Payer: Self-pay | Admitting: Nurse Practitioner

## 2021-06-29 ENCOUNTER — Ambulatory Visit (INDEPENDENT_AMBULATORY_CARE_PROVIDER_SITE_OTHER): Payer: Medicare Other | Admitting: Nurse Practitioner

## 2021-06-29 VITALS — BP 138/60 | HR 68 | Ht 72.0 in | Wt 254.0 lb

## 2021-06-29 DIAGNOSIS — M5459 Other low back pain: Secondary | ICD-10-CM | POA: Insufficient documentation

## 2021-06-29 DIAGNOSIS — I10 Essential (primary) hypertension: Secondary | ICD-10-CM | POA: Diagnosis present

## 2021-06-29 DIAGNOSIS — H2510 Age-related nuclear cataract, unspecified eye: Secondary | ICD-10-CM | POA: Insufficient documentation

## 2021-06-29 DIAGNOSIS — M47816 Spondylosis without myelopathy or radiculopathy, lumbar region: Secondary | ICD-10-CM | POA: Insufficient documentation

## 2021-06-29 DIAGNOSIS — I739 Peripheral vascular disease, unspecified: Secondary | ICD-10-CM

## 2021-06-29 DIAGNOSIS — G4700 Insomnia, unspecified: Secondary | ICD-10-CM | POA: Insufficient documentation

## 2021-06-29 DIAGNOSIS — E119 Type 2 diabetes mellitus without complications: Secondary | ICD-10-CM

## 2021-06-29 DIAGNOSIS — R42 Dizziness and giddiness: Secondary | ICD-10-CM | POA: Diagnosis not present

## 2021-06-29 DIAGNOSIS — H40039 Anatomical narrow angle, unspecified eye: Secondary | ICD-10-CM | POA: Insufficient documentation

## 2021-06-29 DIAGNOSIS — M722 Plantar fascial fibromatosis: Secondary | ICD-10-CM | POA: Insufficient documentation

## 2021-06-29 DIAGNOSIS — S2231XA Fracture of one rib, right side, initial encounter for closed fracture: Secondary | ICD-10-CM | POA: Insufficient documentation

## 2021-06-29 DIAGNOSIS — S9031XA Contusion of right foot, initial encounter: Secondary | ICD-10-CM | POA: Insufficient documentation

## 2021-06-29 DIAGNOSIS — I779 Disorder of arteries and arterioles, unspecified: Secondary | ICD-10-CM

## 2021-06-29 DIAGNOSIS — I6521 Occlusion and stenosis of right carotid artery: Secondary | ICD-10-CM | POA: Diagnosis not present

## 2021-06-29 DIAGNOSIS — I2510 Atherosclerotic heart disease of native coronary artery without angina pectoris: Secondary | ICD-10-CM

## 2021-06-29 DIAGNOSIS — E785 Hyperlipidemia, unspecified: Secondary | ICD-10-CM

## 2021-06-29 DIAGNOSIS — Z9849 Cataract extraction status, unspecified eye: Secondary | ICD-10-CM | POA: Insufficient documentation

## 2021-06-29 DIAGNOSIS — Z9641 Presence of insulin pump (external) (internal): Secondary | ICD-10-CM | POA: Insufficient documentation

## 2021-06-29 DIAGNOSIS — E669 Obesity, unspecified: Secondary | ICD-10-CM | POA: Insufficient documentation

## 2021-06-29 DIAGNOSIS — G459 Transient cerebral ischemic attack, unspecified: Secondary | ICD-10-CM | POA: Insufficient documentation

## 2021-06-29 DIAGNOSIS — M48061 Spinal stenosis, lumbar region without neurogenic claudication: Secondary | ICD-10-CM | POA: Insufficient documentation

## 2021-06-29 DIAGNOSIS — M5430 Sciatica, unspecified side: Secondary | ICD-10-CM | POA: Insufficient documentation

## 2021-06-29 DIAGNOSIS — B351 Tinea unguium: Secondary | ICD-10-CM | POA: Insufficient documentation

## 2021-06-29 DIAGNOSIS — Z794 Long term (current) use of insulin: Secondary | ICD-10-CM

## 2021-06-29 DIAGNOSIS — C4492 Squamous cell carcinoma of skin, unspecified: Secondary | ICD-10-CM | POA: Insufficient documentation

## 2021-06-29 DIAGNOSIS — Z961 Presence of intraocular lens: Secondary | ICD-10-CM | POA: Insufficient documentation

## 2021-06-29 DIAGNOSIS — M545 Low back pain, unspecified: Secondary | ICD-10-CM | POA: Insufficient documentation

## 2021-06-29 LAB — CBC
HCT: 36.9 % — ABNORMAL LOW (ref 39.0–52.0)
Hemoglobin: 12 g/dL — ABNORMAL LOW (ref 13.0–17.0)
MCH: 28.8 pg (ref 26.0–34.0)
MCHC: 32.5 g/dL (ref 30.0–36.0)
MCV: 88.7 fL (ref 80.0–100.0)
Platelets: 306 10*3/uL (ref 150–400)
RBC: 4.16 MIL/uL — ABNORMAL LOW (ref 4.22–5.81)
RDW: 14 % (ref 11.5–15.5)
WBC: 12.6 10*3/uL — ABNORMAL HIGH (ref 4.0–10.5)
nRBC: 0 % (ref 0.0–0.2)

## 2021-06-29 LAB — BASIC METABOLIC PANEL
Anion gap: 7 (ref 5–15)
BUN: 19 mg/dL (ref 8–23)
CO2: 24 mmol/L (ref 22–32)
Calcium: 9.1 mg/dL (ref 8.9–10.3)
Chloride: 105 mmol/L (ref 98–111)
Creatinine, Ser: 1.24 mg/dL (ref 0.61–1.24)
GFR, Estimated: >60 mL/min (ref >60)
Glucose, Bld: 130 mg/dL — ABNORMAL HIGH (ref 70–99)
Potassium: 4.4 mmol/L (ref 3.5–5.1)
Sodium: 136 mmol/L (ref 135–145)

## 2021-06-29 LAB — VITAMIN B12: Vitamin B-12: 948 pg/mL — ABNORMAL HIGH (ref 180–914)

## 2021-06-29 NOTE — Patient Instructions (Signed)
Medication Instructions:   Your physician recommends that you continue on your current medications as directed. Please refer to the Current Medication list given to you today.  *If you need a refill on your cardiac medications before your next appointment, please call your pharmacy*   Lab Work:  Please go to the [place] for a Lab Draw (CBC, BMP, and B12) after your appointment today.   Testing/Procedures: None ordered   Follow-Up: At [facility], you and your health needs are our priority.  As part of our continuing mission to provide you with exceptional heart care, we have created designated Provider Care Teams.  These Care Teams include your primary Cardiologist (physician) and Advanced Practice Providers (APPs -  Physician Assistants and Nurse Practitioners) who all work together to provide you with the care you need, when you need it.  We recommend signing up for the patient portal called "MyChart".  Sign up information is provided on this After Visit Summary.  MyChart is used to connect with patients for Virtual Visits (Telemedicine).  Patients are able to view lab/test results, encounter notes, upcoming appointments, etc.  Non-urgent messages can be sent to your provider as well.   To learn more about what you can do with MyChart, go to [email or web address].    Your next appointment:   4-6 week(s)  The format for your next appointment:   In Person  Provider:   You may see Kathlyn Sacramento, MD or one of the following Advanced Practice Providers on your designated Care Team:   Murray Hodgkins, NP Christell Faith, PA-C Cadence Kathlen Mody, [place]    Other Instructions   Important Information About Sugar

## 2021-06-29 NOTE — Progress Notes (Signed)
Office Visit    Patient Name: Derek Hogan Date of Encounter: 06/29/2021  Primary Care Provider:  Kirk Ruths, MD Primary Cardiologist:  Kathlyn Sacramento, MD  Chief Complaint    76 year old male with above past medical history including CAD, carotid arterial disease, hypertension, hyperlipidemia, chronic HFpEF, lower extremity peripheral arterial disease, diabetes, possible TIA in 2016, right lower extremity venous insufficiency, lumbar spinal stenosis with radiculopathy, and obesity, who presents for follow-up secondary to dizziness.  Past Medical History    Past Medical History:  Diagnosis Date   CAD, ARTERY BYPASS GRAFT    a. 1996 s/p CABG x 4 (LIMA->LAD, VG->D1, VG->OM3, VG->RPDA; b. 12/2016 [facility]): EF 47%, anteroapical and inf infarcts w/ anteroapical peri-infarct ischemia; c. 01/2017: Cath: LM 60md, LAD 100p/m, D1 90ost, 70p, LCX 80ost/p, 1047mRCA 4015m5d, RPDA 95ost, VG->RPDA 30p/m, VG->OM3 nl, LIMA->LAD nl, VG->D1 nl; d. 06/2019 [facility]: EF 52%, basal inf/mid ant/mid inf/apical ant/apical infarct w/ ischemia.   Carotid arterial disease ([place]  a. s/p remote R CEA (Dr. GreDrucie Opitzb. 04/2021 Carotid U/S: 1-30% bilat ICA stenoses.   Diastolic dysfunction    a. 05/2021 Echo: EF 60-65%, no rwma, GrI DD, nl RV fxn.   Dyslipidemia    Erectile dysfunction    GERD (gastroesophageal reflux disease)    Heart attack ([facility]   HYPERLIPIDEMIA-MIXED 06/09/2008   Hypertension    HYPERTENSION, UNSPECIFIED 06/09/2008   Orthostatic hypotension    Palpitations    a. 04/2021 Zio: Predominantly RSR @ 77 (60-144), 5 SVT runs - fastest 144, longest 20 beats. Occas PVCs - 5%.   PVD (peripheral vascular disease) ([place]  a. 02/2014 s/p DBA/stent to prox R popliteal; b. 06/2021 orbital atherectomy and DCBA to L popliteal (90%). Otw moderate, nonobs PAD.   Transient ischemic attack (TIA)    Type I diabetes mellitus (HCCUplandx'd 08/20/1966   Viral gastroenteritis    04/19/2010  improved   Weakness    Past Surgical History:  Procedure Laterality Date   ABDOMINAL AORTOGRAM W/LOWER EXTREMITY N/A 06/07/2021   Procedure: ABDOMINAL AORTOGRAM W/LOWER EXTREMITY;  Surgeon: AriWellington HampshireD;  Location: [facility];  Service: Cardiovascular;  Laterality: N/A;   CARDIAC CATHETERIZATION  1996; ~ 2010   CAROTID ENDARTERECTOMY Right    CATARACT EXTRACTION, BILATERAL Bilateral    COLONOSCOPY WITH PROPOFOL N/A 06/07/2020   Procedure: COLONOSCOPY WITH PROPOFOL;  Surgeon: LocLesly RubensteinD;  Location: [facility];  Service: Endoscopy;  Laterality: N/A;   CORONARY ANGIOPLASTY     CORONARY ARTERY BYPASS GRAFT  1996   "CABG X4"   ESOPHAGOGASTRODUODENOSCOPY N/A 06/07/2020   Procedure: ESOPHAGOGASTRODUODENOSCOPY (EGD);  Surgeon: LocLesly RubensteinD;  [facility];  Service: Endoscopy;  Laterality: N/A;   LEFT HEART CATH AND CORS/GRAFTS ANGIOGRAPHY N/A 01/11/2017   Procedure: LEFT HEART CATH AND CORS/GRAFTS ANGIOGRAPHY;  Surgeon: BenJolaine ArtistD;  Location: [facility] [place] [facility];  Service: Cardiovascular;  Laterality: N/A;   MOHS SURGERY     PERIPHERAL VASCULAR ATHERECTOMY  06/07/2021   Procedure: PERIPHERAL VASCULAR ATHERECTOMY;  Surgeon: AriWellington HampshireD;  Location: [facility] [place] [facility];  Service: Cardiovascular;;   PERIPHERAL VASCULAR CATHETERIZATION N/A 12/15/2014   Procedure: Abdominal Aortogram;  Surgeon: MuhWellington HampshireD;  Location: [facility];  Service: Cardiovascular;  Laterality: N/A;   REFRACTIVE SURGERY Right    "before cataract OR"    Allergies  Allergies  Allergen Reactions   Simvastatin  Swelling   Lisinopril Other (See Comments)    Unknown    History of Present Illness    76 year old male with above past medical history including CAD, carotid arterial disease, hypertension, hyperlipidemia, lower extremity peripheral arterial disease, chronic HFpEF, diabetes, possible TIA in 2016, right lower extremity venous  insufficiency, lumbar spinal stenosis with radiculopathy, and obesity.  He previously underwent CABG x4 in 1996.  He quit at the time of his bypass surgery.  He previously underwent right carotid endarterectomy and has been followed with serial ultrasounds over the years (March 2023 1 to 39% bilateral ICA stenoses).  In the setting of lower extremity arterial disease, he underwent drug-coated balloon angioplasty and stenting of the proximal right popliteal in January 2016.  Most recent diagnostic catheterization was performed December 2018 revealing for 4 patent grafts with severe native multivessel disease, and he was medically managed.  Stress testing in 2021 was low risk with prior infarct but no ischemia.  He was seen in late March of this year with some increase in lower extremity swelling and weight gain.  He was placed on torsemide therapy and underwent echocardiography which showed normal LV function with grade 1 diastolic dysfunction.  Event monitoring was also performed which showed predominantly RSR @ 77 (60-144), 5 SVT runs - fastest 144, longest 20 beats. Occas PVCs - 5%.  He was seen in f/u 05/23/2021 in the setting of L foot pain w/ small ulceration of the L big toe.  ABIs were undertaken and showed noncompressible lower extremity arteries.  Duplex has 50-74% stenosis in the right SFA and biphasic waveforms at the left common femoral.  Decision was made to pursue angiography, which was performed on May 3 and showed a heavily calcified 90% stenosis within the left above-knee popliteal artery and otherwise moderate, nonobstructive disease bilaterally.  The popliteal was treated with orbital atherectomy and drug-coated balloon angioplasty.  Follow-up lower extremity arterial duplex showed no evidence of restenosis on May 10.  Patient recently contacted our office due to lightheadedness.  Initially, this was fairly profound and persistent throughout the day despite normal heart rates and blood pressures,  which he recorded during episodes and sent in through [place].  He thought perhaps it was his Plavix causing his symptoms and so he started taking it in the evening and thinks this is helped some.  He still occasionally feels lightheaded but it occurs randomly and is typically pretty brief in nature.  There are no other associated symptoms and he denies chest pain, dyspnea, palpitations, PND, orthopnea, dizziness, syncope, edema, claudication, or early satiety.  Home Medications    Current Outpatient Medications  Medication Sig Dispense Refill   aspirin 81 MG tablet Take 81 mg by mouth at bedtime.     atorvastatin (LIPITOR) 80 MG tablet Take 1 tablet (80 mg total) by mouth daily.     Calcium-Magnesium-Zinc (CAL-MAG-ZINC PO) Take 1 tablet by mouth daily.     carvedilol (COREG) 6.25 MG tablet Take 1 tablet (6.25 mg total) by mouth 2 (two) times daily. 180 tablet 3   clopidogrel (PLAVIX) 75 MG tablet Take 1 tablet (75 mg total) by mouth daily. 30 tablet 6   ezetimibe (ZETIA) 10 MG tablet Take 10 mg by mouth daily.     glucagon 1 MG injection 1 mg once as needed (low blood glucose).     hydrOXYzine (ATARAX/VISTARIL) 25 MG tablet Take 1 tablet by mouth daily as needed for anxiety.     insulin aspart (NOVOLOG) 100 unit/mL  injection Inject into the skin continuous. INSULIN PUMP     losartan (COZAAR) 100 MG tablet Take 100 mg by mouth daily.     methocarbamol (ROBAXIN) 500 MG tablet Take 500 mg by mouth daily as needed for muscle spasms.     NEOMYCIN-POLYMYXIN-HYDROCORTISONE (CORTISPORIN) 1 % SOLN OTIC solution Apply to nail beds from procedure site twice daily after soaks 10 mL 0   nitroGLYCERIN (NITROSTAT) 0.4 MG SL tablet Place 0.4 mg under the tongue every 5 (five) minutes as needed for chest pain.      omeprazole (PRILOSEC) 20 MG capsule Take 20 mg by mouth every morning.      sertraline (ZOLOFT) 100 MG tablet Take 100 mg by mouth daily.     torsemide (DEMADEX) 20 MG tablet Take 20 mg by mouth  every other day.     traZODone (DESYREL) 50 MG tablet Take 25 mg by mouth at bedtime.     No current facility-administered medications for this visit.     Review of Systems    Lightheadedness as above though mostly improved.  He denies chest pain, dyspnea, palpitations, PND, orthopnea, dizziness, syncope, edema, or early satiety.  All other systems reviewed and are otherwise negative except as noted above.    Physical Exam    VS:  BP 140/60 (BP Location: Left Arm, Patient Position: Sitting, Cuff Size: Normal)   Pulse 68   Ht 6' (1.829 m)   Wt 254 lb (115.2 kg)   SpO2 91%   BMI 34.45 kg/m  , BMI Body mass index is 34.45 kg/m.     Vitals:   06/29/21 1400 06/29/21 1823  BP: 140/60 138/60  Pulse: 68   SpO2: 91%     GEN: Well nourished, well developed, in no acute distress. HEENT: normal. Neck: Supple, no JVD or masses.  Right carotid bruit.  Cardiac: RRR, no murmurs, rubs, or gallops. No clubbing, cyanosis, edema.  Radials 2+ bilaterally.  PT is palpable on the left.  Respiratory:  Respirations regular and unlabored, clear to auscultation bilaterally. GI: Soft, nontender, nondistended, BS + x 4. MS: no deformity or atrophy. Skin: warm and dry, no rash. Neuro:  Strength and sensation are intact. Psych: Normal affect.  Accessory Clinical Findings    Lab Results  Component Value Date   WBC 12.6 (H) 06/29/2021   HGB 12.0 (L) 06/29/2021   HCT 36.9 (L) 06/29/2021   MCV 88.7 06/29/2021   PLT 306 06/29/2021   Lab Results  Component Value Date   CREATININE 1.24 06/29/2021   BUN 19 06/29/2021   NA 136 06/29/2021   K 4.4 06/29/2021   CL 105 06/29/2021   CO2 24 06/29/2021   Lab Results  Component Value Date   ALT 23 03/16/2015   AST 25 03/16/2015   ALKPHOS 62 03/16/2015   BILITOT 0.7 03/16/2015   Lab Results  Component Value Date   CHOL 116 07/08/2014   HDL 43 07/08/2014   LDLCALC 63 07/08/2014   TRIG 51 07/08/2014   CHOLHDL 2.7 07/08/2014    Lab Results   Component Value Date   HGBA1C 7.8 (H) 10/09/2012    Assessment & Plan    1.  Lightheadedness: Patient recently contacted our office due to persistent lightheadedness despite normal blood pressures and heart rates.  Initially symptoms were quite profound and prolonged, and he was concerned that perhaps his Plavix was causing symptoms.  He has since been taking Plavix in the evening and over time, symptoms seem to have  improved.  He still occasionally has a brief episode of lightheadedness which can occur randomly.  There has never been an association between his lightheadedness and changes in head position.  He denies room spinning/vertiginous-like symptoms.  He has had some orthostatic lightheadedness.  Monitoring earlier this year showed predominantly sinus rhythm with brief runs of SVT though no significant arrhythmias or symptoms.  Overall, symptoms have improved though not yet resolved.  Lab work today shows stable H&H with mild leukocytosis, somewhat similar to what was seen previously.  Creatinine is up slightly at 1.24.  Encouraged adequate hydration.  2.  Peripheral arterial disease: Status post prior angioplasty and stenting of right popliteal artery with more recent orbital atherectomy drug-coated balloon angioplasty of left popliteal artery.  Recent ABI showed no evidence of restenosis.  Continue dual antiplatelet therapy for minimum of 30 days.  He was previously taking low-dose Xarelto, which is on hold in the setting of dual antiplatelet therapy.  3.  Hyperlipidemia: He remains on atorvastatin therapy with an LDL 68 in March of last year.  4.  Type 2 diabetes mellitus: A1c 8.2 last year.  This is followed by his primary care provider and he remains on insulin.  5.  Coronary artery disease: Status post prior CABG x4.  Myoview in 2021 was low risk.  He has not been having any chest pain or change in activity tolerance.  He remains on aspirin, statin, beta-blocker, and ARB.  6.  Carotid  arterial disease: Status post right carotid endarterectomy.  Stable carotid ultrasound with 1 to 39% bilateral internal carotid artery stenoses in April.  He remains on aspirin and statin therapy.  7.  Essential hypertension: Relatively stable.  Continue current regimen.    8.  Disposition: He is scheduled for follow-up in 3 months.  He will contact us sooner if symptoms worsen.   Murray Hodgkins, NP 06/29/2021, 6:09 PM

## 2021-06-30 ENCOUNTER — Ambulatory Visit
Admission: RE | Admit: 2021-06-30 | Discharge: 2021-06-30 | Disposition: A | Discharge status: Home / Self Care | Payer: Medicare Other | Source: Ambulatory Visit | Attending: Specialist | Admitting: Specialist

## 2021-06-30 DIAGNOSIS — J849 Interstitial pulmonary disease, unspecified: Secondary | ICD-10-CM

## 2021-07-05 ENCOUNTER — Ambulatory Visit: Payer: Medicare Other | Admitting: Physician Assistant

## 2021-07-05 ENCOUNTER — Other Ambulatory Visit: Payer: Medicare Other

## 2021-07-12 ENCOUNTER — Encounter: Payer: Self-pay | Admitting: Podiatry

## 2021-07-12 ENCOUNTER — Ambulatory Visit (INDEPENDENT_AMBULATORY_CARE_PROVIDER_SITE_OTHER): Payer: Medicare Other | Admitting: Podiatry

## 2021-07-12 DIAGNOSIS — L600 Ingrowing nail: Secondary | ICD-10-CM

## 2021-07-12 NOTE — Progress Notes (Signed)
  Subjective:  Patient ID: Derek Hogan, male    DOB: [birth year],  MRN: [id number]  Chief Complaint  Patient presents with   Nail Problem    "It's not doing good.  There's  a lot of buildup under the nail.  I need it to be removed."    76 y.o. male returns for follow-up with the above complaint. History confirmed with patient.  There is a lot of skin buildup still feels uncomfortable.  The ingrown portion is doing better.  He has been using the ointment as directed and doing the soaks  Objective:  Physical Exam: Matricectomy site is healing there is a scab and some delayed healing in the area.  No signs of infection.  Dry hyperkeratosis of the distal nail fold difficult to palpate pedal pulses.  Capillary fill time is improved to prior exam, foot is warm to touch.  Assessment:   1. Ingrowing left great toenail      Plan:  Patient was evaluated and treated and all questions answered.    Ingrown Nail, left -Improving he does have some delayed healing which I debrided today and applied Silvadene ointment.  He will clean the area daily and apply Neosporin but I do not think he has a need to have a tight bandage on at this point.  I will see him back in a few weeks for reevaluation.  There were no signs of infection or need for oral antibiotics.  Advised on worsening issues here.  Expect the dry skin under the nail to resolve with time   Return in about 4 weeks (around 08/09/2021) for nail re-check.

## 2021-07-20 ENCOUNTER — Encounter: Payer: Self-pay | Admitting: Cardiovascular Disease

## 2021-07-24 ENCOUNTER — Other Ambulatory Visit (HOSPITAL_COMMUNITY): Payer: Self-pay | Admitting: Cardiovascular Disease

## 2021-07-24 DIAGNOSIS — Z9862 Peripheral vascular angioplasty status: Secondary | ICD-10-CM

## 2021-07-31 ENCOUNTER — Ambulatory Visit: Payer: Medicare Other | Admitting: Podiatry

## 2021-08-09 ENCOUNTER — Ambulatory Visit: Payer: Medicare Other | Admitting: Podiatry

## 2021-08-10 ENCOUNTER — Other Ambulatory Visit: Payer: Self-pay | Admitting: Family Medicine

## 2021-08-10 DIAGNOSIS — N6320 Unspecified lump in the left breast, unspecified quadrant: Secondary | ICD-10-CM

## 2021-08-14 ENCOUNTER — Encounter: Payer: Self-pay | Admitting: Podiatry

## 2021-08-14 ENCOUNTER — Ambulatory Visit (INDEPENDENT_AMBULATORY_CARE_PROVIDER_SITE_OTHER): Payer: Medicare Other | Admitting: Podiatry

## 2021-08-14 DIAGNOSIS — L600 Ingrowing nail: Secondary | ICD-10-CM | POA: Diagnosis not present

## 2021-08-14 NOTE — Progress Notes (Signed)
  Subjective:  Patient ID: Derek Hogan, male    DOB: 1945/10/23,  MRN: [id number]  Chief Complaint  Patient presents with   Ingrown Toenail    Ingrowing left great toenail - Follow up    76 y.o. male returns for follow-up with the above complaint. History confirmed with patient.  Notes that its been doing a little bit better there is still some thick dry skin and toenail still does not look completely normal Objective:  Physical Exam: Some persistent scab but much improved since last visit appears to be healing well now.  No signs of infection.  Dry hyperkeratosis of the distal nail fold difficult to palpate pedal pulses.  Capillary fill time is improved to prior exam, foot is warm to touch.  Assessment:   1. Ingrowing left great toenail       Plan:  Patient was evaluated and treated and all questions answered.    Ingrown Nail, left -Overall doing much better.  He has had some delayed healing of his matricectomy site but appears to be nearly completely resolved at this point.  I debrided the residual scab and hyperkeratosis.  Advised he may leave open to air discontinue ointment and apply lotion to the dry skin.  Return as needed if it does not improve or worsens   Return if symptoms worsen or fail to improve.

## 2021-08-17 ENCOUNTER — Ambulatory Visit
Admission: RE | Admit: 2021-08-17 | Discharge: 2021-08-17 | Disposition: A | Discharge status: Home / Self Care | Payer: Medicare Other | Source: Ambulatory Visit | Attending: Family Medicine | Admitting: Family Medicine

## 2021-08-17 ENCOUNTER — Other Ambulatory Visit: Payer: Self-pay | Admitting: Family Medicine

## 2021-08-17 DIAGNOSIS — N6320 Unspecified lump in the left breast, unspecified quadrant: Secondary | ICD-10-CM

## 2021-08-22 ENCOUNTER — Telehealth: Payer: Self-pay | Admitting: Cardiovascular Disease

## 2021-08-22 NOTE — Telephone Encounter (Signed)
Patient calling in to discuss his MRI

## 2021-08-23 NOTE — Telephone Encounter (Signed)
Patient dropped off some records from the [place] to be reviewed as well Placed in nurse box

## 2021-08-23 NOTE — Telephone Encounter (Signed)
[facility] report and disc placed on Dr. Tyrell Antonio desk for him to review.

## 2021-08-28 ENCOUNTER — Encounter: Payer: Self-pay | Admitting: Cardiovascular Disease

## 2021-08-28 ENCOUNTER — Other Ambulatory Visit: Payer: Self-pay

## 2021-08-28 NOTE — Progress Notes (Signed)
Discontinued Plavix per Dr.Arida's recommendation as noted below:  I reviewed the brain MRI.  The microhemorrhages are likely not related to the blood thinners.  Anyway, it has been more than 2 months since most recent procedure and thus we can discontinue clopidogrel altogether and continue aspirin 81 mg once daily.

## 2021-09-04 ENCOUNTER — Encounter: Payer: Self-pay | Admitting: Cardiovascular Disease

## 2021-09-04 ENCOUNTER — Telehealth: Payer: Self-pay | Admitting: Cardiovascular Disease

## 2021-09-04 NOTE — Telephone Encounter (Signed)
   Pre-operative Risk Assessment    Patient Name: Derek Hogan  DOB: Jul 21, 1945 MRN: [id number]      Request for Surgical Clearance    Procedure:   Spinal Cord Stimulator Trial   Date of Surgery:  Clearance 09/18/21                                 Surgeon:  not indicated Surgeon's Group or Practice Name:  [facility] Phone number:  [phone] Fax number:  [phone]   Type of Clearance Requested:   - Pharmacy:  Hold Aspirin and Rivaroxaban (Xarelto) discontinue xarelto 3 days prior and stay off 4 days during trial - discontinue aspirin 7 days prior and 4 days during trial   Type of Anesthesia:  Not Indicated   Additional requests/questions:    Manfred Arch   09/04/2021, 4:33 PM

## 2021-09-04 NOTE — Telephone Encounter (Signed)
   Patient Name: Derek Hogan  DOB: 15-Sep-1945 MRN: [id number]  Primary Cardiologist: Kathlyn Sacramento, MD  Chart reviewed as part of pre-operative protocol coverage. Given past medical history and time since last visit, based on ACC/AHA guidelines, ARYAV WIMBERLY would be at acceptable risk for the planned procedure without further cardiovascular testing.   Patient is currently not on Xarelto per our records.  Patient can hold aspirin 7 days prior to procedure and 4 days after.   I will route this recommendation to the requesting party via Epic fax function and remove from pre-op pool.  Please call with questions.  Mable Fill, Marissa Nestle, NP 09/04/2021, 4:55 PM

## 2021-09-05 ENCOUNTER — Ambulatory Visit: Payer: Medicare Other | Admitting: Adult Health

## 2021-09-11 NOTE — Telephone Encounter (Signed)
Spoke with patient who states that he thought he has been taken Xarelto and he needed a note stating that he needs to hold Xarelto prior to procedure. Per Jaquelyn Bitter, patient is not on Xarelto according to our records. Patient states that he was put on Xarelto by Dr. Haroldine Laws a while back. Patient would like to know what to do. Please advise.

## 2021-09-11 NOTE — Telephone Encounter (Signed)
Pt is calling requesting call back in regards to this medication. He says his procedure is coming up soon and would like this taken care of.

## 2021-09-11 NOTE — Telephone Encounter (Signed)
   Primary Cardiologist: Kathlyn Sacramento, MD  Chart reviewed as part of pre-operative protocol coverage. Given past medical history and time since last visit, based on ACC/AHA guidelines, Derek Hogan would be at acceptable risk for the planned procedure without further cardiovascular testing.   I called patient for clarification of his current medications. He was on Plavix and aspirin for 2 months post PV procedure. He was advised to stop Plavix and continue aspirin alone on 08/28/21. His Xarelto was stopped in May and he was advised by Dr. Fletcher Anon 08/30/21 not to resume Xarelto at this time.   Patient was advised that if he develops new symptoms prior to surgery to contact our office to arrange a follow-up appointment. He verbalized understanding.  I will route this recommendation to the requesting party via Epic fax function and remove from pre-op pool.  Please call with questions.  Emmaline Life, NP-C    09/11/2021, 10:17 AM [place] [address] Office [phone] Fax [phone]

## 2021-09-26 ENCOUNTER — Ambulatory Visit (INDEPENDENT_AMBULATORY_CARE_PROVIDER_SITE_OTHER): Payer: Medicare Other | Admitting: Cardiovascular Disease

## 2021-09-26 VITALS — BP 128/60 | HR 78 | Ht 72.0 in | Wt 246.1 lb

## 2021-09-26 DIAGNOSIS — I739 Peripheral vascular disease, unspecified: Secondary | ICD-10-CM

## 2021-09-26 DIAGNOSIS — I779 Disorder of arteries and arterioles, unspecified: Secondary | ICD-10-CM | POA: Diagnosis not present

## 2021-09-26 DIAGNOSIS — I872 Venous insufficiency (chronic) (peripheral): Secondary | ICD-10-CM

## 2021-09-26 DIAGNOSIS — I6521 Occlusion and stenosis of right carotid artery: Secondary | ICD-10-CM

## 2021-09-26 DIAGNOSIS — E785 Hyperlipidemia, unspecified: Secondary | ICD-10-CM

## 2021-09-26 DIAGNOSIS — I10 Essential (primary) hypertension: Secondary | ICD-10-CM

## 2021-09-26 DIAGNOSIS — I25118 Atherosclerotic heart disease of native coronary artery with other forms of angina pectoris: Secondary | ICD-10-CM | POA: Diagnosis not present

## 2021-09-26 NOTE — Progress Notes (Unsigned)
Cardiology Office Note   Date:  09/26/2021   ID:  Derek Hogan, Derek Hogan 12-Jul-1945, MRN [id number]  PCP:  Kirk Ruths, MD  Cardiologist:  Dr. Fletcher Anon  Chief Complaint  Patient presents with   Other    OD 4 wk f/u no complaints today. Meds reviewed verbally with pt.      History of Present Illness: JAGO CARTON is a 76 y.o. male who presents for a follow-up visit regarding coronary artery disease and peripheral arterial disease.  He has known history of CAD S/P previous inferior wall myocardial infarction followed by CABG in 1996. He had questionable TIA in March 2016. He quit smoking in 1996 after CABG. He has prolonged history of diabetes currently on insulin pump, Carotid disease status post right carotid endarterectomy, hyperlipidemia and hypertension. He is known to have peripheral arterial disease.  He had drug-coated balloon angioplasty followed by self-expanding stent placement to the proximal right popliteal artery for severe claudication in November 2016.  He had a Lexiscan Myoview in May 2021 which showed no evidence of ischemia.   He is known to have right leg edema due to suspected venous insufficiency.  He had lower extremity venous Doppler in August 2021 which showed no evidence of DVT.  Echocardiogram in August 2022 showed normal LV systolic function with grade 1 diastolic dysfunction and no significant valvular abnormalities.    He had sleep study done at the [place] which showed mild obstructive sleep apnea.    He was seen recently for nonhealing ulceration on the left big toe.  I proceeded with angiography last week which showed no significant aortoiliac disease.  On the left side, there was moderately calcified common femoral artery stenosis, mild nonobstructive SFA disease and severe calcified proximal popliteal artery disease with three-vessel runoff below the knee.  I performed successful orbital atherectomy and drug-coated balloon angioplasty to the  left popliteal artery.  I had some difficulty retrieving the distal protection device due to deformation of the sheath but I was ultimately able to remove it.  The patient reported some discomfort in the medial aspect of the left knee after the procedure but overall was mild.  He reports increased swelling in the left leg and also has not been feeling well overall with increased shortness of breath and fatigue.  I did labs on him today which showed normal troponin with mildly elevated BNP.  Renal function was normal but CBC did show mild leukocytosis at 17,000 mild drop in hemoglobin to 10.8.  He has no hematoma.  Past Medical History:  Diagnosis Date   CAD, ARTERY BYPASS GRAFT    a. 1996 s/p CABG x 4 (LIMA->LAD, VG->D1, VG->OM3, VG->RPDA; b. 12/2016 [facility]): EF 47%, anteroapical and inf infarcts w/ anteroapical peri-infarct ischemia; c. 01/2017: Cath: LM 16md, LAD 100p/m, D1 90ost, 70p, LCX 80ost/p, 1059mRCA 4040m5d, RPDA 95ost, VG->RPDA 30p/m, VG->OM3 nl, LIMA->LAD nl, VG->D1 nl; d. 06/2019 [facility]: EF 52%, basal inf/mid ant/mid inf/apical ant/apical infarct w/ ischemia.   Carotid arterial disease ([facility]  a. s/p remote R CEA (Dr. GreDrucie Opitzb. 04/2021 Carotid U/S: 1-30% bilat ICA stenoses.   Diastolic dysfunction    a. 05/2021 Echo: EF 60-65%, no rwma, GrI DD, nl RV fxn.   Dyslipidemia    Erectile dysfunction    GERD (gastroesophageal reflux disease)    Heart attack ([facility]   HYPERLIPIDEMIA-MIXED 06/09/2008   Hypertension    HYPERTENSION, UNSPECIFIED 06/09/2008   Orthostatic hypotension  Palpitations    a. 04/2021 [facility]io: Predominantly RSR @ 77 (60-144), 5 SVT runs - fastest 144, longest 20 beats. Occas PVCs - 5%.   PVD (peripheral vascular disease) ([place])    a. 02/2014 s/p DBA/stent to prox R popliteal; b. 06/2021 orbital atherectomy and DCBA to L popliteal (90%). Otw moderate, nonobs PAD.   Transient ischemic attack (TIA)    Type I diabetes mellitus ([place]) dx'd 08/20/1966   Viral  gastroenteritis    04/19/2010 improved   Weakness     Past Surgical History:  Procedure Laterality Date   ABDOMINAL AORTOGRAM W/LOWER EXTREMITY N/A 06/07/2021   Procedure: ABDOMINAL AORTOGRAM W/LOWER EXTREMITY;  Surgeon: Wellington Hampshire, MD;  Location: [place] [facility];  Service: Cardiovascular;  Laterality: N/A;   CARDIAC CATHETERIZATION  1996; ~ 2010   CAROTID ENDARTERECTOMY Right    CATARACT EXTRACTION, BILATERAL Bilateral    COLONOSCOPY WITH PROPOFOL N/A 06/07/2020   Procedure: COLONOSCOPY WITH PROPOFOL;  Surgeon: Lesly Rubenstein, MD;  Location: [facility] ENDOSCOPY;  Service: Endoscopy;  Laterality: N/A;   CORONARY ANGIOPLASTY     CORONARY ARTERY BYPASS GRAFT  1996   "CABG X4"   ESOPHAGOGASTRODUODENOSCOPY N/A 06/07/2020   Procedure: ESOPHAGOGASTRODUODENOSCOPY (EGD);  Surgeon: Lesly Rubenstein, MD;  Location[facility] ENDOSCOPY;  Service: Endoscopy;  Laterality: N/A;   LEFT HEART CATH AND CORS/GRAFTS ANGIOGRAPHY N/A 01/11/2017   Procedure: LEFT HEART CATH AND CORS/GRAFTS ANGIOGRAPHY;  Surgeon: Jolaine Artist, MD;  Location: [place] [facility];  Service: Cardiovascular;  Laterality: N/A;   MOHS SURGERY     PERIPHERAL VASCULAR ATHERECTOMY  06/07/2021   Procedure: PERIPHERAL VASCULAR ATHERECTOMY;  Surgeon: Wellington Hampshire, MD;  Location: [place] [facility];  Service: Cardiovascular;;   PERIPHERAL VASCULAR CATHETERIZATION N/A 12/15/2014   Procedure: Abdominal Aortogram;  Surgeon: Wellington Hampshire, MD;  Location: [place] [facility];  Service: Cardiovascular;  Laterality: N/A;   REFRACTIVE SURGERY Right    "before cataract OR"     Current Outpatient Medications  Medication Sig Dispense Refill   aspirin 81 MG tablet Take 81 mg by mouth at bedtime.     atorvastatin (LIPITOR) 80 MG tablet Take 1 tablet (80 mg total) by mouth daily.     Calcium-Magnesium-Zinc (CAL-MAG-ZINC PO) Take 1 tablet by mouth daily.     carvedilol (COREG) 6.25 MG tablet Take 1 tablet (6.25 mg total) by mouth 2  (two) times daily. 180 tablet 3   ezetimibe (ZETIA) 10 MG tablet Take 10 mg by mouth daily.     glucagon 1 MG injection 1 mg once as needed (low blood glucose).     hydrOXYzine (ATARAX/VISTARIL) 25 MG tablet Take 1 tablet by mouth daily as needed for anxiety.     insulin aspart (NOVOLOG) 100 unit/mL injection Inject into the skin continuous. INSULIN PUMP     losartan (COZAAR) 100 MG tablet Take 100 mg by mouth daily.     methocarbamol (ROBAXIN) 500 MG tablet Take 500 mg by mouth daily as needed for muscle spasms.     nitroGLYCERIN (NITROSTAT) 0.4 MG SL tablet Place 0.4 mg under the tongue every 5 (five) minutes as needed for chest pain.      omeprazole (PRILOSEC) 20 MG capsule Take 20 mg by mouth every morning.      sertraline (ZOLOFT) 100 MG tablet Take 100 mg by mouth daily.     torsemide (DEMADEX) 20 MG tablet Take 20 mg by mouth every other day.     traZODone (DESYREL) 50 MG  tablet Take 25 mg by mouth at bedtime.     No current facility-administered medications for this visit.    Allergies:   Simvastatin and Lisinopril    Social History:  The patient  reports that he quit smoking about 27 years ago. His smoking use included cigarettes. He has a 105.00 pack-year smoking history. He has never used smokeless tobacco. He reports that he does not currently use alcohol. He reports that he does not use drugs.   Family History:  The patient's family history includes Coronary artery disease in his father and mother; Diabetes in his father and mother; Stroke in an other family member.    ROS:  Please see the history of present illness.   Otherwise, review of systems are positive for none.   All other systems are reviewed and negative.    PHYSICAL EXAM: VS:  BP 128/60 (BP Location: Left Arm, Patient Position: Sitting, Cuff Size: Normal)   Pulse 78   Ht 6' (1.829 m)   Wt 246 lb 2 oz (111.6 kg)   SpO2 94%   BMI 33.38 kg/m  , BMI Body mass index is 33.38 kg/m. GEN: Well nourished, well  developed, in no acute distress  HEENT: normal  Neck: no JVD, or masses. Right carotid bruit Cardiac: RRR; no murmurs, rubs, or gallops, trace right leg swelling. Respiratory:  clear to auscultation bilaterally, normal work of breathing GI: soft, nontender, nondistended, + BS MS: no deformity or atrophy  Skin: warm and dry, no rash Neuro:  Strength and sensation are intact Psych: euthymic mood, full affect Vascular: Femoral pulse is difficult to palpate bilaterally.  No groin hematoma.  Posterior tibial pulse is palpable on the left side.  EKG:  EKG is ordered today. EKG showed normal sinus rhythm with old septal infarct and possible old inferior infarct.   Recent Labs: 06/13/2021: B Natriuretic Peptide 165.4 06/29/2021: BUN 19; Creatinine, Ser 1.24; Hemoglobin 12.0; Platelets 306; Potassium 4.4; Sodium 136    Lipid Panel    Component Value Date/Time   CHOL 116 07/08/2014 0830   TRIG 51 07/08/2014 0830   HDL 43 07/08/2014 0830   CHOLHDL 2.7 07/08/2014 0830   CHOLHDL 2.7 07/19/2010 0839   VLDL 10 07/19/2010 0839   LDLCALC 63 07/08/2014 0830      Wt Readings from Last 3 Encounters:  09/26/21 246 lb 2 oz (111.6 kg)  06/29/21 254 lb (115.2 kg)  06/13/21 264 lb 4 oz (119.9 kg)        ASSESSMENT AND PLAN:  1.  Peripheral arterial disease: Status post angioplasty and stent placement to the right popliteal artery.  Status post recent orbital atherectomy and drug-coated balloon angioplasty of the left popliteal artery.  Increased swelling is expected post revascularization.  His posterior tibial pulses palpable.  He is coming back tomorrow for ABI and duplex.  Not entirely certain what is causing his discomfort in the medial aspect of the knee.  Continue dual antiplatelet therapy with aspirin and clopidogrel for now.  2. Coronary artery disease involving native coronary arteries with other forms of angina: He reports worsening exertional dyspnea but no chest pain.  Most recent  [place] in 2021 was low risk.  If symptoms do not improve, he might require ischemic evaluation.  3. Hyperlipidemia: Continue treatment with high dose atorvastatin with a target LDL of less than 70.   4. Carotid disease status post right carotid endarterectomy: He is scheduled for a follow-up carotid Doppler later this week.  5.  Chronic venous insufficiency affecting the right lower extremity: Continue support stockings.  He does have increased swelling and has not been using torsemide.  I asked him to take torsemide 20 mg daily at least over the next week.  Recheck basic metabolic profile in 1 week.  6.  Essential hypertension: Blood pressures controlled on current medications.  6.  Leukocytosis: He has no symptoms of pneumonia or urinary tract infection.  Continue to monitor for now.    Disposition: Follow-up in 1 month.   Signed,  Kathlyn Sacramento, MD  09/26/2021 4:15 PM    [facility]

## 2021-09-26 NOTE — Patient Instructions (Signed)
Medication Instructions:  Your physician recommends that you continue on your current medications as directed. Please refer to the Current Medication list given to you today.  *If you need a refill on your cardiac medications before your next appointment, please call your pharmacy*   Lab Work: None ordered If you have labs (blood work) drawn today and your tests are completely normal, you will receive your results only by: [place] (if you have MyChart) OR A paper copy in the mail If you have any lab test that is abnormal or we need to change your treatment, we will call you to review the results.   Testing/Procedures: Your physician recommends that you have a lower extremity arterial duplex.  Your physician has requested that you have an ankle brachial index (ABI). During this test an ultrasound and blood pressure cuff are used to evaluate the arteries that supply the arms and legs with blood. Allow thirty minutes for this exam. There are no restrictions or special instructions.   (To be scheduled in Nov 2023)     Follow-Up: At [facility], you and your health needs are our priority.  As part of our continuing mission to provide you with exceptional heart care, we have created designated Provider Care Teams.  These Care Teams include your primary Cardiologist (physician) and Advanced Practice Providers (APPs -  Physician Assistants and Nurse Practitioners) who all work together to provide you with the care you need, when you need it.  We recommend signing up for the patient portal called "MyChart".  Sign up information is provided on this After Visit Summary.  MyChart is used to connect with patients for Virtual Visits (Telemedicine).  Patients are able to view lab/test results, encounter notes, upcoming appointments, etc.  Non-urgent messages can be sent to your provider as well.   To learn more about what you can do with MyChart, go to [email or web address].    Your next  appointment:   6 month(s)  The format for your next appointment:   In Person  Provider:   You may see Kathlyn Sacramento, MD or one of the following Advanced Practice Providers on your designated Care Team:   Murray Hodgkins, NP Christell Faith, PA-C Cadence Kathlen Mody, [place]   Other Instructions N/A  Important Information About Sugar

## 2021-11-13 ENCOUNTER — Emergency Department (HOSPITAL_COMMUNITY): Admission: EM | Admit: 2021-11-13 | Payer: Medicare Other | Source: Home / Self Care

## 2021-11-20 ENCOUNTER — Other Ambulatory Visit: Payer: Medicare Other

## 2021-11-25 ENCOUNTER — Ambulatory Visit
Admission: EM | Admit: 2021-11-25 | Discharge: 2021-11-25 | Disposition: A | Discharge status: Home / Self Care | Payer: Medicare Other | Attending: Emergency Medicine | Admitting: Emergency Medicine

## 2021-11-25 ENCOUNTER — Ambulatory Visit (INDEPENDENT_AMBULATORY_CARE_PROVIDER_SITE_OTHER): Payer: Medicare Other

## 2021-11-25 ENCOUNTER — Encounter: Payer: Self-pay | Admitting: Emergency Medicine

## 2021-11-25 DIAGNOSIS — Z794 Long term (current) use of insulin: Secondary | ICD-10-CM | POA: Insufficient documentation

## 2021-11-25 DIAGNOSIS — R058 Other specified cough: Secondary | ICD-10-CM | POA: Diagnosis not present

## 2021-11-25 DIAGNOSIS — R059 Cough, unspecified: Secondary | ICD-10-CM | POA: Diagnosis not present

## 2021-11-25 DIAGNOSIS — E1151 Type 2 diabetes mellitus with diabetic peripheral angiopathy without gangrene: Secondary | ICD-10-CM | POA: Diagnosis not present

## 2021-11-25 DIAGNOSIS — I2510 Atherosclerotic heart disease of native coronary artery without angina pectoris: Secondary | ICD-10-CM | POA: Diagnosis not present

## 2021-11-25 DIAGNOSIS — E1136 Type 2 diabetes mellitus with diabetic cataract: Secondary | ICD-10-CM | POA: Diagnosis not present

## 2021-11-25 DIAGNOSIS — I252 Old myocardial infarction: Secondary | ICD-10-CM | POA: Insufficient documentation

## 2021-11-25 DIAGNOSIS — I10 Essential (primary) hypertension: Secondary | ICD-10-CM | POA: Diagnosis not present

## 2021-11-25 DIAGNOSIS — Z1152 Encounter for screening for COVID-19: Secondary | ICD-10-CM | POA: Diagnosis not present

## 2021-11-25 DIAGNOSIS — E785 Hyperlipidemia, unspecified: Secondary | ICD-10-CM | POA: Diagnosis not present

## 2021-11-25 DIAGNOSIS — J069 Acute upper respiratory infection, unspecified: Secondary | ICD-10-CM | POA: Insufficient documentation

## 2021-11-25 DIAGNOSIS — Z951 Presence of aortocoronary bypass graft: Secondary | ICD-10-CM | POA: Insufficient documentation

## 2021-11-25 LAB — SARS CORONAVIRUS 2 BY RT PCR: SARS Coronavirus 2 by RT PCR: NEGATIVE

## 2021-11-25 MED ORDER — AZITHROMYCIN 250 MG PO TABS: 250.0000 mg | ORAL_TABLET | Freq: Every day | ORAL | 0 refills | Status: DC

## 2021-11-25 MED ORDER — BENZONATATE 100 MG PO CAPS: 200.0000 mg | ORAL_CAPSULE | Freq: Three times a day (TID) | ORAL | 0 refills | Status: DC

## 2021-11-25 MED ORDER — PROMETHAZINE-DM 6.25-15 MG/5ML PO SYRP: 5.0000 mL | ORAL_SOLUTION | Freq: Four times a day (QID) | ORAL | 0 refills | Status: DC | PRN

## 2021-11-25 MED ORDER — IPRATROPIUM BROMIDE 0.06 % NA SOLN: 2.0000 | Freq: Four times a day (QID) | NASAL | 12 refills | Status: DC

## 2021-11-25 NOTE — ED Triage Notes (Signed)
Pt c/o cough x 4-5 days productive cough with clear phelgm. Pt  denies fever.

## 2021-11-25 NOTE — ED Provider Notes (Signed)
[facility]    CSN: [id number] Arrival date & time: 11/25/21  1020      History   Chief Complaint Chief Complaint  Patient presents with   Cough    HPI Derek Hogan is a 76 y.o. male.   HPI  76 year old male here for evaluation of respiratory complaints.  Patient reports that he has been experiencing runny nose, nasal congestion, productive cough, and wheezing for the last 4 to 5 days.  He has not had any fever, ear pain, sore throat, shortness of breath, body aches, or headaches.  He is a type I diabetic on insulin pump and he states that his sugars have been running up and down but they have been running normal.  He also has a history of CAD with CABG x4, MI, hyperlipidemia, hypertension, 3 times daily, and carotid artery disease with a right carotid endarterectomy in March of this year.  Past Medical History:  Diagnosis Date   CAD, ARTERY BYPASS GRAFT    a. 1996 s/p CABG x 4 (LIMA->LAD, VG->D1, VG->OM3, VG->RPDA; b. 12/2016 MV [facility]): EF 47%, anteroapical and inf infarcts w/ anteroapical peri-infarct ischemia; c. 01/2017: Cath: LM 34md, LAD 100p/m, D1 90ost, 70p, LCX 80ost/p, 1037mRCA 4060m5d, RPDA 95ost, VG->RPDA 30p/m, VG->OM3 nl, LIMA->LAD nl, VG->D1 nl; d. 06/2019 MV: EF 52%, basal inf/mid ant/mid inf/apical ant/apical infarct w/ ischemia.   Carotid arterial disease (HCC[place]  a. s/p remote R CEA (Dr. GreDrucie Opitzb. 04/2021 Carotid U/S: 1-30% bilat ICA stenoses.   Diastolic dysfunction    a. 05/2021 Echo: EF 60-65%, no rwma, GrI DD, nl RV fxn.   Dyslipidemia    Erectile dysfunction    GERD (gastroesophageal reflux disease)    Heart attack (HCC[place]996   HYPERLIPIDEMIA-MIXED 06/09/2008   Hypertension    HYPERTENSION, UNSPECIFIED 06/09/2008   Orthostatic hypotension    Palpitations    a. 04/2021 Zio: Predominantly RSR @ 77 (60-144), 5 SVT runs - fastest 144, longest 20 beats. Occas PVCs - 5%.   PVD (peripheral vascular disease) ([place]CC[place]  a. 02/2014 s/p  DBA/stent to prox R popliteal; b. 06/2021 orbital atherectomy and DCBA to L popliteal (90%). Otw moderate, nonobs PAD.   Transient ischemic attack (TIA)    Type I diabetes mellitus (HCCLumbertonx'd 08/20/1966   Viral gastroenteritis    04/19/2010 improved   Weakness     Patient Active Problem List   Diagnosis Date Noted   Anatomical narrow angle borderline glaucoma 06/29/2021   Cataract extraction status, unspecified eye 06/29/2021   Contusion of right foot 06/29/2021   Fracture of one rib, right side, initial encounter for closed fracture 06/29/2021   Insomnia 06/29/2021   Insulin pump status 06/29/2021   Lens replaced by other means 06/29/2021   Spondylosis without myelopathy or radiculopathy, lumbar region 06/29/2021   Onychomycosis 06/29/2021   Obesity 06/29/2021   Other low back pain 06/29/2021   Plantar fascial fibromatosis 06/29/2021   Sciatica 06/29/2021   Senile nuclear sclerosis 06/29/2021   Spinal stenosis of lumbar region 06/29/2021   Squamous cell carcinoma of skin 06/29/2021   Transient cerebral ischemic attack, unspecified 06/29/2021   OSA (obstructive sleep apnea) 05/30/2021   Myofascial pain 11/02/2020   Sacroiliitis (HCCRosenhayn9/28/2022   Contusion of foot 07/31/2018   Lacunar infarction (HCCBalt[place]0/31/2019   Peripheral vascular angioplasty status    PAD (peripheral artery disease) (HCCInverness Highlands North1/08/2014   GERD (gastroesophageal reflux disease) 10/03/2013   Malaise and fatigue 06/16/2012  Cough 12/16/2011   Precordial pain 01/05/2011   Syncope 07/25/2010   Leukocytosis 07/25/2010   DIZZINESS 05/18/2009   MYOCARDIAL PERFUSION SCAN, WITH STRESS TEST, ABNORMAL 12/20/2008   SHORTNESS OF BREATH 11/25/2008   Insulin dependent type 2 diabetes mellitus ([place]) 10/01/2008   HYPOTENSION, ORTHOSTATIC 07/28/2008   Hyperlipidemia LDL goal <70 06/09/2008   ERECTILE DYSFUNCTION 06/09/2008   Essential hypertension 06/09/2008   Coronary atherosclerosis of native coronary artery 06/09/2008    Carotid stenosis 06/09/2008   HYPOTENSION, UNSPECIFIED 06/09/2008    Past Surgical History:  Procedure Laterality Date   ABDOMINAL AORTOGRAM W/LOWER EXTREMITY N/A 06/07/2021   Procedure: ABDOMINAL AORTOGRAM W/LOWER EXTREMITY;  Surgeon: Wellington Hampshire, MD;  Location: [place] [facility];  Service: Cardiovascular;  Laterality: N/A;   CARDIAC CATHETERIZATION  1996; ~ 2010   CAROTID ENDARTERECTOMY Right    CATARACT EXTRACTION, BILATERAL Bilateral    COLONOSCOPY WITH PROPOFOL N/A 06/07/2020   Procedure: COLONOSCOPY WITH PROPOFOL;  Surgeon: Lesly Rubenstein, MD;  Location: [facility];  Service: Endoscopy;  Laterality: N/A;   CORONARY ANGIOPLASTY     CORONARY ARTERY BYPASS GRAFT  1996   "CABG X4"   ESOPHAGOGASTRODUODENOSCOPY N/A 06/07/2020   Procedure: ESOPHAGOGASTRODUODENOSCOPY (EGD);  Surgeon: Lesly Rubenstein, MD;  Location: [facility]COPY;  Service: Endoscopy;  Laterality: N/A;   LEFT HEART CATH AND CORS/GRAFTS ANGIOGRAPHY N/A 01/11/2017   Procedure: LEFT HEART CATH AND CORS/GRAFTS ANGIOGRAPHY;  Surgeon: Jolaine Artist, MD;  Location: [place] [facility];  Service: Cardiovascular;  Laterality: N/A;   MOHS SURGERY     PERIPHERAL VASCULAR ATHERECTOMY  06/07/2021   Procedure: PERIPHERAL VASCULAR ATHERECTOMY;  Surgeon: Wellington Hampshire, MD;  Location: [place] [facility];  Service: Cardiovascular;;   PERIPHERAL VASCULAR CATHETERIZATION N/A 12/15/2014   Procedure: Abdominal Aortogram;  Surgeon: Wellington Hampshire, MD;  Location: [place] [facility];  Service: Cardiovascular;  Laterality: N/A;   REFRACTIVE SURGERY Right    "before cataract OR"       Home Medications    Prior to Admission medications   Medication Sig Start Date End Date Taking? Authorizing Provider  azithromycin (ZITHROMAX Z-PAK) 250 MG tablet Take 1 tablet (250 mg total) by mouth daily. Take 2 tablets on the first day and then 1 tablet daily thereafter for a total of 5 days of treatment. 11/25/21  Yes Margarette Canada,  NP  benzonatate (TESSALON) 100 MG capsule Take 2 capsules (200 mg total) by mouth every 8 (eight) hours. 11/25/21  Yes Margarette Canada, NP  ipratropium (ATROVENT) 0.06 % nasal spray Place 2 sprays into both nostrils 4 (four) times daily. 11/25/21  Yes Margarette Canada, NP  promethazine-dextromethorphan (PROMETHAZINE-DM) 6.25-15 MG/5ML syrup Take 5 mLs by mouth 4 (four) times daily as needed. 11/25/21  Yes Margarette Canada, NP  aspirin 81 MG tablet Take 81 mg by mouth at bedtime.    [provider]  atorvastatin (LIPITOR) 80 MG tablet Take 1 tablet (80 mg total) by mouth daily. 05/23/11   Bensimhon, Shaune Pascal, MD  Calcium-Magnesium-Zinc (CAL-MAG-ZINC PO) Take 1 tablet by mouth daily.    [provider]  carvedilol (COREG) 6.25 MG tablet Take 1 tablet (6.25 mg total) by mouth 2 (two) times daily. 05/29/21   Rise Mu, PA-C  ezetimibe (ZETIA) 10 MG tablet Take 10 mg by mouth daily.    [provider]  glucagon 1 MG injection 1 mg once as needed (low blood glucose). 03/18/20   [provider]  hydrOXYzine (ATARAX/VISTARIL) 25 MG tablet  Take 1 tablet by mouth daily as needed for anxiety. 09/30/19   [provider]  insulin aspart (NOVOLOG) 100 unit/mL injection Inject into the skin continuous. INSULIN PUMP    [provider]  losartan (COZAAR) 100 MG tablet Take 100 mg by mouth daily.    [provider]  methocarbamol (ROBAXIN) 500 MG tablet Take 500 mg by mouth daily as needed for muscle spasms.    [provider]  nitroGLYCERIN (NITROSTAT) 0.4 MG SL tablet Place 0.4 mg under the tongue every 5 (five) minutes as needed for chest pain.  09/21/15   [provider]  omeprazole (PRILOSEC) 20 MG capsule Take 20 mg by mouth every morning.     [provider]  sertraline (ZOLOFT) 100 MG tablet Take 100 mg by mouth daily.    [provider]  torsemide (DEMADEX) 20 MG tablet Take 20 mg by mouth every other day.    [provider]  traZODone (DESYREL) 50 MG tablet Take 25 mg by mouth at bedtime.    [provider]    Family History Family History  Problem Relation Age of Onset   Coronary artery disease Mother    Diabetes Mother    Stroke Other    Coronary artery disease Father    Diabetes Father     Social History Social History   Tobacco Use   Smoking status: Former    Packs/day: 3.00    Years: 35.00    Total pack years: 105.00    Types: Cigarettes    Quit date: 02/05/1994    Years since quitting: 27.8   Smokeless tobacco: Never  Vaping Use   Vaping Use: Never used  Substance Use Topics   Alcohol use: Not Currently   Drug use: No     Allergies   Simvastatin and Lisinopril   Review of Systems Review of Systems  Constitutional:  Negative for fever.  HENT:  Positive for congestion and rhinorrhea. Negative for ear pain and sore throat.   Respiratory:  Positive for cough and wheezing. Negative for shortness of breath.   Musculoskeletal:  Negative for arthralgias and myalgias.  Skin:  Negative for rash.  Neurological:  Negative for headaches.  Hematological: Negative.   Psychiatric/Behavioral: Negative.       Physical Exam Triage Vital Signs ED Triage Vitals  Enc Vitals Group     BP 11/25/21 1028 (!) 142/72     Pulse Rate 11/25/21 1028 64     Resp --      Temp 11/25/21 1028 98 F (36.7 C)     Temp Source 11/25/21 1028 Oral     SpO2 11/25/21 1028 96 %     Weight 11/25/21 1027 240 lb (108.9 kg)     Height 11/25/21 1027 6' (1.829 m)     Head Circumference --      Peak Flow --      Pain Score 11/25/21 1027 0     Pain Loc --      Pain Edu? --      Excl. in [place]? --    No data found.  Updated Vital Signs BP (!) 142/72 (BP Location: Left Arm)   Pulse 64   Temp 98 F (36.7 C) (Oral)   Ht 6' (1.829 m)   Wt 240 lb (108.9 kg)   SpO2 96%   BMI 32.55 kg/m   Visual Acuity Right Eye Distance:   Left Eye Distance:   Bilateral Distance:    Right  Eye Near:    Left Eye Near:    Bilateral Near:     Physical Exam Vitals and nursing note reviewed.  Constitutional:      Appearance: Normal appearance. He is not ill-appearing.  HENT:     Head: Normocephalic and atraumatic.     Right Ear: Tympanic membrane, ear canal and external ear normal. There is no impacted cerumen.     Left Ear: Tympanic membrane, ear canal and external ear normal. There is no impacted cerumen.     Nose: Congestion and rhinorrhea present.     Comments: Nasal mucosa is erythematous and edematous with copious clear rhinorrhea in both nares.    Mouth/Throat:     Mouth: Mucous membranes are moist.     Pharynx: Oropharynx is clear. Posterior oropharyngeal erythema present. No oropharyngeal exudate.     Comments: Posterior pharynx has erythema and injection with clear postnasal drip. Cardiovascular:     Rate and Rhythm: Normal rate and regular rhythm.     Pulses: Normal pulses.     Heart sounds: Normal heart sounds. No murmur heard.    No friction rub. No gallop.  Pulmonary:     Effort: Pulmonary effort is normal.     Breath sounds: Normal breath sounds. No wheezing, rhonchi or rales.  Musculoskeletal:     Cervical back: Normal range of motion and neck supple.  Lymphadenopathy:     Cervical: No cervical adenopathy.  Skin:    General: Skin is warm and dry.     Capillary Refill: Capillary refill takes less than 2 seconds.     Findings: No erythema or rash.  Neurological:     General: No focal deficit present.     Mental Status: He is alert and oriented to person, place, and time.  Psychiatric:        Mood and Affect: Mood normal.        Behavior: Behavior normal.        Thought Content: Thought content normal.        Judgment: Judgment normal.      UC Treatments / Results  Labs (all labs ordered are listed, but only abnormal results are displayed) Labs Reviewed  SARS CORONAVIRUS 2 BY RT PCR    EKG   Radiology DG Chest 2 View  Result Date:  11/25/2021 CLINICAL DATA:  Pt c/o cough x 4-5 days productive cough with clear phelgm. Pt denies fever. EXAM: CHEST - 2 VIEW COMPARISON:  10/24/2015 FINDINGS: Coarse bronchovascular markings as before with peripheral scarring on the left. No new infiltrate or overt edema. Heart size and mediastinal contours are within normal limits. Aortic Atherosclerosis (ICD10-170.0). CABG markers. No effusion. Sternotomy wires. IMPRESSION: No acute disease post CABG. Electronically Signed   By: Lucrezia Europe M.D.   On: 11/25/2021 11:02    Procedures Procedures (including critical care time)  Medications Ordered in UC Medications - No data to display  Initial Impression / Assessment and Plan / UC Course  I have reviewed the triage vital signs and the nursing notes.  Pertinent labs & imaging results that were available during my care of the patient were reviewed by me and considered in my medical decision making (see chart for details).   Patient is a nontoxic-appearing 76 year old male here for evaluation of respiratory complaints outlined in HPI above.  Patient is not in any acute distress and is able to speak in full sentence without any dyspnea or tachypnea.  He does have clear rhinorrhea and inflammation of  his nasal mucosa on exam.  His cardiopulmonary exam reveals clear lung sounds in all fields but given his significant past medical history and his sputum production I will obtain chest x-ray to look for any acute cardiopulmonary process.  I am also going to order a COVID PCR to look for the presence of COVID.  Given the duration of patient's symptoms he is outside the therapeutic window for Tamiflu if he has influenza, which I doubt given the lack of fever, so I will not test him for influenza.  Ideology impression of chest x-ray states that there is coarse bronchovascular markings as before with peripheral scarring on the left.  No new infiltrates or overt edema.  No acute disease post CABG.  COVID PCR is  negative.  I will discharge patient home with a diagnosis of viral URI with a cough.  Given his past medical history of CABG and type 1 diabetes I will also prescribe a Z-Pak that the patient can use if his symptoms or not improving or if his sputum goes from clear to colored.  I will prescribe Atrovent nasal spray to help with the nasal congestion, Tessalon Perles, and Promethazine DM cough syrup for cough and congestion.   Final Clinical Impressions(s) / UC Diagnoses   Final diagnoses:  Viral URI with cough     Discharge Instructions      Your COVID test was negative and your chest x-ray did not show any signs of pneumonia.  I do believe that you are either suffering from a viral respiratory infection or that this may be secondary effect from your recent [place] immunization.  Use the Atrovent nasal spray, 2 squirts of each nostril every 6 hours, to help with nasal congestion, runny nose, and postnasal drip.  Use the Tessalon Perles every 8 hours as needed for cough.  Take them with a small sip of water.  You may experience some numbness to the base of your tongue or metallic taste in your mouth, this is normal.  Use the Promethazine DM cough syrup at bedtime as needed for cough and congestion.  This medication will make you drowsy so be mindful that if you have to get up in the middle the night to use bathroom.  Make sure that you have your bearings about you before you attempt to walk to the bathroom as to not fall.  If your symptoms are not improving, or you develop color change to your sputum, pick up the azithromycin and start taking it.  You will take it once daily as directed by the package.  If your symptoms not improving, or they worsen, either return for reevaluation or see your primary care provider.     ED Prescriptions     Medication Sig Dispense Auth. Provider   azithromycin (ZITHROMAX Z-PAK) 250 MG tablet Take 1 tablet (250 mg total) by mouth daily. Take 2 tablets on  the first day and then 1 tablet daily thereafter for a total of 5 days of treatment. 6 tablet Margarette Canada, NP   benzonatate (TESSALON) 100 MG capsule Take 2 capsules (200 mg total) by mouth every 8 (eight) hours. 21 capsule Margarette Canada, NP   ipratropium (ATROVENT) 0.06 % nasal spray Place 2 sprays into both nostrils 4 (four) times daily. 15 mL Margarette Canada, NP   promethazine-dextromethorphan (PROMETHAZINE-DM) 6.25-15 MG/5ML syrup Take 5 mLs by mouth 4 (four) times daily as needed. 118 mL Margarette Canada, NP      PDMP not reviewed this encounter.  Margarette Canada, NP 11/25/21 1138

## 2021-11-25 NOTE — Discharge Instructions (Addendum)
Your COVID test was negative and your chest x-ray did not show any signs of pneumonia.  I do believe that you are either suffering from a viral respiratory infection or that this may be secondary effect from your recent [place] immunization.  Use the Atrovent nasal spray, 2 squirts of each nostril every 6 hours, to help with nasal congestion, runny nose, and postnasal drip.  Use the Tessalon Perles every 8 hours as needed for cough.  Take them with a small sip of water.  You may experience some numbness to the base of your tongue or metallic taste in your mouth, this is normal.  Use the Promethazine DM cough syrup at bedtime as needed for cough and congestion.  This medication will make you drowsy so be mindful that if you have to get up in the middle the night to use bathroom.  Make sure that you have your bearings about you before you attempt to walk to the bathroom as to not fall.  If your symptoms are not improving, or you develop color change to your sputum, pick up the azithromycin and start taking it.  You will take it once daily as directed by the package.  If your symptoms not improving, or they worsen, either return for reevaluation or see your primary care provider.

## 2021-12-12 ENCOUNTER — Ambulatory Visit: Payer: Medicare Other | Attending: Cardiovascular Disease

## 2021-12-12 DIAGNOSIS — Z9862 Peripheral vascular angioplasty status: Secondary | ICD-10-CM | POA: Insufficient documentation

## 2021-12-15 ENCOUNTER — Other Ambulatory Visit: Payer: Self-pay | Admitting: *Deleted

## 2021-12-15 DIAGNOSIS — I739 Peripheral vascular disease, unspecified: Secondary | ICD-10-CM

## 2021-12-18 ENCOUNTER — Encounter: Payer: Self-pay | Admitting: Cardiovascular Disease

## 2022-01-01 ENCOUNTER — Ambulatory Visit (INDEPENDENT_AMBULATORY_CARE_PROVIDER_SITE_OTHER): Payer: Medicare Other | Admitting: Pulmonary Disease

## 2022-01-01 ENCOUNTER — Encounter: Payer: Self-pay | Admitting: Pulmonary Disease

## 2022-01-01 VITALS — BP 130/70 | HR 69 | Temp 97.7°F | Ht 72.0 in | Wt 239.0 lb

## 2022-01-01 DIAGNOSIS — R053 Chronic cough: Secondary | ICD-10-CM

## 2022-01-01 DIAGNOSIS — J45998 Other asthma: Secondary | ICD-10-CM

## 2022-01-01 DIAGNOSIS — K219 Gastro-esophageal reflux disease without esophagitis: Secondary | ICD-10-CM

## 2022-01-01 DIAGNOSIS — I499 Cardiac arrhythmia, unspecified: Secondary | ICD-10-CM | POA: Insufficient documentation

## 2022-01-01 LAB — NITRIC OXIDE: Nitric Oxide: 51

## 2022-01-01 MED ORDER — ALBUTEROL SULFATE HFA 108 (90 BASE) MCG/ACT IN AERS: 2.0000 | INHALATION_SPRAY | Freq: Four times a day (QID) | RESPIRATORY_TRACT | 2 refills | Status: DC | PRN

## 2022-01-01 MED ORDER — TRELEGY ELLIPTA 100-62.5-25 MCG/ACT IN AEPB: 1.0000 | INHALATION_SPRAY | Freq: Every day | RESPIRATORY_TRACT | 11 refills | Status: DC

## 2022-01-01 MED ORDER — TRELEGY ELLIPTA 100-62.5-25 MCG/ACT IN AEPB: 1.0000 | INHALATION_SPRAY | Freq: Every day | RESPIRATORY_TRACT | 0 refills | Status: DC

## 2022-01-01 NOTE — Progress Notes (Unsigned)
I demonstrated to the patient how to use the Trelegy and Albuterol inhaler. He is aware of how to use the inhalers.

## 2022-01-01 NOTE — Progress Notes (Unsigned)
Subjective:    Patient ID: Derek Hogan, male    DOB: Jul 01, 1945, 76 y.o.   MRN: [id number] Patient Care Team: Kirk Ruths, MD as PCP - General (Unknown Physician Specialty) Wellington Hampshire, MD as PCP - Cardiology (Cardiology) Bensimhon, Shaune Pascal, MD as PCP - Advanced Heart Failure (Cardiology)  Chief Complaint  Patient presents with   Cough    Cough since October. Clear sputum.    HPI Patient is a 76 year old former smoker (105 PY) with a history as noted below, who presents for evaluation of a nonproductive cough of 7 weeks duration.  The patient is kindly referred referred by Dr. Frazier Richards.  The patient states that he was in his usual state of health until he had his COVID booster shot on 20 November 2021.  Cough started 2 days after that immunization.  Initially evaluated at the med then urgent care on 25 November 2021 and discharged on azithromycin and Tessalon Perles.  COVID testing was negative as well as influenza testing.  At that time he had some rhinorrhea and was treated also with Atrovent nasal spray.  After that therapy, the cough failed to improve so he went to his primary care physician's office on 05 December 2021 and treated with doxycycline and a prednisone taper.  Patient was unable to complete the prednisone taper due to side effects of hyperglycemia and insomnia.  He did complete his doxycycline antibiotic prescription but again without relief of his cough.  On 15 December 2021 he was seen at the emergency room at the [facility] had a complete viral PCR performed that was negative.  He was informed that he either had a viral illness, or side effects from the COVID vaccine.  He was not given a specific treatment.  Subsequently he was evaluated by ENT (note not available for my review) and it was recommended that he be evaluated here.  Throughout this illness the patient has not had any fevers, chills or sweats.  As noted initially had some rhinorrhea  but this resolved.  No hemoptysis.  No sputum production.  Does note that his cough worsens if he reclines or is supine.  Has not noted wheezing per se.  He has a history of gastroesophageal reflux but this is well-controlled with omeprazole.  He is not on ACE inhibitors.  He does not endorse any chest pain, lower extremity edema or calf tenderness.  No orthopnea or paroxysmal nocturnal dyspnea.  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  No exposure to Agent Orange to his knowledge.  No history of asthma or frequent respiratory infections as a child.  Review of Systems A 10 point review of systems was performed and it is as noted above otherwise negative.  Past Medical History:  Diagnosis Date   CAD, ARTERY BYPASS GRAFT    a. 1996 s/p CABG x 4 (LIMA->LAD, VG->D1, VG->OM3, VG->RPDA; b. 12/2016 MV [facility]): EF 47%, anteroapical and inf infarcts w/ anteroapical peri-infarct ischemia; c. 01/2017: Cath: LM 59md, LAD 100p/m, D1 90ost, 70p, LCX 80ost/p, 1042mRCA 4037m5d, RPDA 95ost, VG->RPDA 30p/m, VG->OM3 nl, LIMA->LAD nl, VG->D1 nl; d. 06/2019 MV: EF 52%, basal inf/mid ant/mid inf/apical ant/apical infarct w/ ischemia.   Car[place]id arterial disease ([place]  a. s/p remote R CEA (Dr. GreDrucie Opitzb. 04/2021 Carotid U/S: 1-30% bilat ICA stenoses.   Diastolic dysfunction    a. 05/2021 Echo: EF 60-65%, no rwma, GrI  DD, nl RV fxn.   Dyslipidemia    Erectile dysfunction    GERD (gastroesophageal reflux disease)    Heart attack ([place]) 1996   HYPERLIPIDEMIA-MIXED 06/09/2008   Hypertension    HYPERTENSION, UNSPECIFIED 06/09/2008   Orthostatic hypotension    Palpitations    a. 04/2021 Zio: Predominantly RSR @ 77 (60-144), 5 SVT runs - fastest 144, longest 20 beats. Occas PVCs - 5%.   PVD (peripheral vascular disease) ([place])    a. 02/2014 s/p DBA/stent to prox R popliteal; b. 06/2021 orbital atherectomy and DCBA to L popliteal (90%). Otw moderate, nonobs PAD.   Transient  ischemic attack (TIA)    Type I diabetes mellitus ([place]) dx'd 08/20/1966   Viral gastroenteritis    04/19/2010 improved   Weakness    Past Surgical History:  Procedure Laterality Date   ABDOMINAL AORTOGRAM W/LOWER EXTREMITY N/A 06/07/2021   Procedure: ABDOMINAL AORTOGRAM W/LOWER EXTREMITY;  Surgeon: Wellington Hampshire, MD;  Location: [place] [facility];  Service: Cardiovascular;  Laterality: N/A;   CARDIAC CATHETERIZATION  1996; ~ 2010   CAROTID ENDARTERECTOMY Right    CATARACT EXTRACTION, BILATERAL Bilateral    COLONOSCOPY WITH PROPOFOL N/A 06/07/2020   Procedure: COLONOSCOPY WITH PROPOFOL;  Surgeon: Lesly Rubenstein, MD;  Location: [facility] ENDOSCOPY;  Service: Endoscopy;  Laterality: N/A;   CORONARY ANGIOPLASTY     CORONARY ARTERY BYPASS GRAFT  1996   "CABG X4"   ESOPHAGOGASTRODUODENOSCOPY N/A 06/07/2020   Procedure: ESOPHAGOGASTRODUODENOSCOPY (EGD);  Surgeon: Lesly Rubenstein, MD;  Location: [facility] ENDOSCOPY;  Service: Endoscopy;  Laterality: N/A;   LEFT HEART CATH AND CORS/GRAFTS ANGIOGRAPHY N/A 01/11/2017   Procedure: LEFT HEART CATH AND CORS/GRAFTS ANGIOGRAPHY;  Surgeon: Jolaine Artist, MD;  Location: [place] [facility];  Service: Cardiovascular;  Laterality: N/A;   MOHS SURGERY     PERIPHERAL VASCULAR ATHERECTOMY  06/07/2021   Procedure: PERIPHERAL VASCULAR ATHERECTOMY;  Surgeon: Wellington Hampshire, MD;  Location: [place] [facility];  Service: Cardiovascular;;   PERIPHERAL VASCULAR CATHETERIZATION N/A 12/15/2014   Procedure: Abdominal Aortogram;  Surgeon: Wellington Hampshire, MD;  Location: [place] [facility];  Service: Cardiovascular;  Laterality: N/A;   REFRACTIVE SURGERY Right    "before cataract OR"   Patient Active Problem List   Diagnosis Date Noted   Anatomical narrow angle borderline glaucoma 06/29/2021   Cataract extraction status, unspecified eye 06/29/2021   Contusion of right foot 06/29/2021   Fracture of one rib, right side, initial encounter for closed fracture 06/29/2021    Insomnia 06/29/2021   Insulin pump status 06/29/2021   Lens replaced by other means 06/29/2021   Spondylosis without myelopathy or radiculopathy, lumbar region 06/29/2021   Onychomycosis 06/29/2021   Obesity 06/29/2021   Other low back pain 06/29/2021   Plantar fascial fibromatosis 06/29/2021   Sciatica 06/29/2021   Senile nuclear sclerosis 06/29/2021   Spinal stenosis of lumbar region 06/29/2021   Squamous cell carcinoma of skin 06/29/2021   Transient cerebral ischemic attack, unspecified 06/29/2021   OSA (obstructive sleep apnea) 05/30/2021   Myofascial pain 11/02/2020   Sacroiliitis ([place]) 11/02/2020   Contusion of foot 07/31/2018   Lacunar infarction ([place]) 12/05/2017   Peripheral vascular angioplasty status    PAD (peripheral artery disease) ([place]) 12/13/2014   GERD (gastroesophageal reflux disease) 10/03/2013   Malaise and fatigue 06/16/2012   Cough 12/16/2011   Precordial pain 01/05/2011   Syncope 07/25/2010   Leukocytosis 07/25/2010   DIZZINESS 05/18/2009   MYOCARDIAL PERFUSION SCAN, WITH STRESS TEST, ABNORMAL  12/20/2008   SHORTNESS OF BREATH 11/25/2008   Insulin dependent type 2 diabetes mellitus ([place]) 10/01/2008   HYPOTENSION, ORTHOSTATIC 07/28/2008   Hyperlipidemia LDL goal <70 06/09/2008   ERECTILE DYSFUNCTION 06/09/2008   Essential hypertension 06/09/2008   Coronary atherosclerosis of native coronary artery 06/09/2008   Carotid stenosis 06/09/2008   HYPOTENSION, UNSPECIFIED 06/09/2008   Family History  Problem Relation Age of Onset   Coronary artery disease Mother    Diabetes Mother    Stroke Other    Coronary artery disease Father    Diabetes Father    Social History   Tobacco Use   Smoking status: Former    Packs/day: 3.00    Years: 35.00    Total pack years: 105.00    Types: Cigarettes    Quit date: 02/05/1994    Years since quitting: 27.9   Smokeless tobacco: Never  Substance Use Topics   Alcohol use: Not Currently   Allergies  Allergen  Reactions   Simvastatin Swelling   Lisinopril Other (See Comments)    Unknown   Current Meds  Medication Sig   aspirin 81 MG tablet Take 81 mg by mouth at bedtime.   atorvastatin (LIPITOR) 80 MG tablet Take 1 tablet (80 mg total) by mouth daily.   Calcium-Magnesium-Zinc (CAL-MAG-ZINC PO) Take 1 tablet by mouth daily.   carvedilol (COREG) 6.25 MG tablet Take 1 tablet (6.25 mg total) by mouth 2 (two) times daily.   ezetimibe (ZETIA) 10 MG tablet Take 10 mg by mouth daily.   glucagon 1 MG injection 1 mg once as needed (low blood glucose).   hydrOXYzine (ATARAX/VISTARIL) 25 MG tablet Take 1 tablet by mouth daily as needed for anxiety.   insulin aspart (NOVOLOG) 100 unit/mL injection Inject into the skin continuous. INSULIN PUMP   ipratropium (ATROVENT) 0.06 % nasal spray Place 2 sprays into both nostrils 4 (four) times daily.   losartan (COZAAR) 100 MG tablet Take 100 mg by mouth daily.   methocarbamol (ROBAXIN) 500 MG tablet Take 500 mg by mouth daily as needed for muscle spasms.   nitroGLYCERIN (NITROSTAT) 0.4 MG SL tablet Place 0.4 mg under the tongue every 5 (five) minutes as needed for chest pain.    omeprazole (PRILOSEC) 20 MG capsule Take 20 mg by mouth every morning.    sertraline (ZOLOFT) 100 MG tablet Take 100 mg by mouth daily.   torsemide (DEMADEX) 20 MG tablet Take 20 mg by mouth every other day.   traZODone (DESYREL) 50 MG tablet Take 25 mg by mouth at bedtime.   Immunization History  Administered Date(s) Administered   Influenza, High Dose Seasonal PF 10/22/2017   Influenza, Seasonal, Injecte, Preservative Fre 10/30/2012   Influenza,inj,quad, With Preservative 10/07/2018   Influenza-Unspecified 10/05/2013, 11/15/2013, 10/11/2014, 11/10/2014, 10/27/2015, 10/27/2016, 10/28/2016, 10/22/2017, 10/22/2018, 12/07/2019, 10/12/2020, 11/25/2020   PFIZER Comirnaty(Gray Top)Covid-19 Tri-Sucrose Vaccine 05/26/2019, 06/16/2019, 12/25/2019, 08/16/2020   PFIZER(Purple Top)SARS-COV-2  Vaccination 05/26/2019, 06/16/2019, 12/25/2019   Pfizer Covid-19 Vaccine Bivalent Booster 27yr & up 12/30/2020   Pneumococcal Conjugate-13 11/05/2012, 06/18/2014   Pneumococcal Polysaccharide-23 03/08/1997, 12/31/2003, 03/04/2009, 07/06/2015   Tdap 05/22/2021        Objective:   Physical Exam BP 130/70 (BP Location: Left Arm, Cuff Size: Large)   Pulse 69   Temp 97.7 F (36.5 C)   Ht 6' (1.829 m)   Wt 239 lb (108.4 kg)   SpO2 99%   BMI 32.41 kg/m  GENERAL: Overweight gentleman, no acute distress fully ambulatory, no conversational dyspnea. HEAD: Normocephalic, atraumatic.  EYES: Pupils equal, round, reactive to light.  No scleral icterus.  MOUTH: Dentition intact, no thrush. NECK: Supple. No thyromegaly. Trachea midline. No JVD.  No adenopathy. PULMONARY: Good air entry bilaterally.  No adventitious sounds. CARDIOVASCULAR: S1 and S2. Regular rate and rhythm.  No rubs, murmurs or gallops heard. ABDOMEN: Mildly protuberant otherwise benign. MUSCULOSKELETAL: No joint deformity, no clubbing, no edema.  NEUROLOGIC: No overt focal deficit, no gait disturbance, speech is fluent. SKIN: Intact,warm,dry. PSYCH: Mood and behavior normal  Chest x-ray performed 25 November 2021 consistent with COPD/mild interstitial changes.   Representative image of CT scan of the chest performed 30 Jun 2021 showing some interstitial changes he also showed emphysema:   Lab Results  Component Value Date   NITRICOXIDE 51 01/01/2022  Study consistent with type II inflammation in the airway.      Assessment & Plan:

## 2022-01-01 NOTE — Patient Instructions (Signed)
You had significant airway inflammation today noted by the test we did in the office this is suggestive of asthma.  We are giving you a trial of an inhaler called Trelegy Ellipta this is 1 puff daily.  Make sure you rinse your mouth well after you use it.  We have sent a prescription to the [facility] however if after a month you are doing well with it we can send it to the pharmacy at the [facility] in [place].  We also are sending in an inhaler for a medication called albuterol.  This is a "rescue" inhaler.  This is in case you get short of breath, wheezing or excessive cough to see if it helps with this.  You can use it up to 4 times a day.  But this is only on an as-needed basis.  We are scheduling you for breathing tests.  We will see you in follow-up in 6 to 8 weeks time call sooner should any new problems arise.

## 2022-01-03 ENCOUNTER — Institutional Professional Consult (permissible substitution): Payer: Medicare Other | Admitting: Student in an Organized Health Care Education/Training Program

## 2022-01-03 ENCOUNTER — Encounter: Payer: Self-pay | Admitting: Pulmonary Disease

## 2022-01-03 NOTE — Telephone Encounter (Signed)
I have spoke with Mr. Derek Hogan and we have scheduled his PFT on 01/24/2022 @ [facility] Entr[facility] and I will mail the instructions to him

## 2022-01-15 ENCOUNTER — Encounter: Payer: Self-pay | Admitting: Pulmonary Disease

## 2022-01-15 NOTE — Telephone Encounter (Signed)
Dr. Gonzalez, please advise. Thanks 

## 2022-01-15 NOTE — Telephone Encounter (Signed)
He needs to continue the inhaler because he had significant inflammation in the airway that is consistent with asthma.  He is to have breathing tests as well that will give [place] more information.  But for now I think he has asthma and this will not go away and if he stops the inhaler I am afraid the cough will return.

## 2022-01-24 ENCOUNTER — Ambulatory Visit: Payer: Medicare Other | Attending: Pulmonary Disease

## 2022-01-24 DIAGNOSIS — Z87891 Personal history of nicotine dependence: Secondary | ICD-10-CM | POA: Insufficient documentation

## 2022-01-24 DIAGNOSIS — R053 Chronic cough: Secondary | ICD-10-CM | POA: Insufficient documentation

## 2022-01-24 LAB — PULMONARY FUNCTION TEST ARMC ONLY
DL/VA % pred: 82 %
DL/VA: 3.21 ml/min/mmHg/L
DLCO unc % pred: 63 %
DLCO unc: 16.85 ml/min/mmHg
FEF 25-75 Post: 3.56 L/sec
FEF 25-75 Pre: 2.58 L/sec
FEF2575-%Change-Post: 38 %
FEF2575-%Pred-Post: 153 %
FEF2575-%Pred-Pre: 111 %
FEV1-%Change-Post: 11 %
FEV1-%Pred-Post: 92 %
FEV1-%Pred-Pre: 82 %
FEV1-Post: 2.99 L
FEV1-Pre: 2.69 L
FEV1FVC-%Change-Post: 8 %
FEV1FVC-%Pred-Pre: 106 %
FEV6-%Change-Post: 2 %
FEV6-%Pred-Post: 84 %
FEV6-%Pred-Pre: 82 %
FEV6-Post: 3.56 L
FEV6-Pre: 3.49 L
FEV6FVC-%Change-Post: 0 %
FEV6FVC-%Pred-Post: 106 %
FEV6FVC-%Pred-Pre: 106 %
FVC-%Change-Post: 2 %
FVC-%Pred-Post: 79 %
FVC-%Pred-Pre: 77 %
FVC-Post: 3.56 L
FVC-Pre: 3.49 L
Post FEV1/FVC ratio: 84 %
Post FEV6/FVC ratio: 100 %
Pre FEV1/FVC ratio: 77 %
Pre FEV6/FVC Ratio: 100 %
RV % pred: 58 %
RV: 1.59 L
TLC % pred: 68 %
TLC: 5.09 L

## 2022-01-24 MED ORDER — ALBUTEROL SULFATE (2.5 MG/3ML) 0.083% IN NEBU
2.5000 mg | INHALATION_SOLUTION | Freq: Once | RESPIRATORY_TRACT | Status: AC
  Administered 2022-01-24: 2.5 mg via RESPIRATORY_TRACT
  Filled 2022-01-24: qty 3

## 2022-01-30 ENCOUNTER — Telehealth: Payer: Self-pay | Admitting: Pulmonary Disease

## 2022-01-30 NOTE — Telephone Encounter (Signed)
I spoke with the patient and rescheduled his appointment to 02/06/2022 with Dr. Patsey Berthold. Nothing further needed.

## 2022-02-01 ENCOUNTER — Other Ambulatory Visit
Admission: RE | Admit: 2022-02-01 | Discharge: 2022-02-01 | Disposition: A | Discharge status: Home / Self Care | Payer: Medicare Other | Source: Ambulatory Visit | Attending: Pulmonary Disease | Admitting: Pulmonary Disease

## 2022-02-01 ENCOUNTER — Encounter: Payer: Self-pay | Admitting: Pulmonary Disease

## 2022-02-01 ENCOUNTER — Ambulatory Visit (INDEPENDENT_AMBULATORY_CARE_PROVIDER_SITE_OTHER): Payer: Medicare Other | Admitting: Pulmonary Disease

## 2022-02-01 VITALS — BP 118/70 | HR 60 | Temp 97.3°F | Ht 72.0 in | Wt 239.2 lb

## 2022-02-01 DIAGNOSIS — J4540 Moderate persistent asthma, uncomplicated: Secondary | ICD-10-CM | POA: Diagnosis not present

## 2022-02-01 DIAGNOSIS — J45998 Other asthma: Secondary | ICD-10-CM

## 2022-02-01 DIAGNOSIS — K219 Gastro-esophageal reflux disease without esophagitis: Secondary | ICD-10-CM

## 2022-02-01 DIAGNOSIS — J8410 Pulmonary fibrosis, unspecified: Secondary | ICD-10-CM | POA: Insufficient documentation

## 2022-02-01 DIAGNOSIS — R053 Chronic cough: Secondary | ICD-10-CM

## 2022-02-01 LAB — CBC WITH DIFFERENTIAL/PLATELET
Abs Immature Granulocytes: 0.04 10*3/uL (ref 0.00–0.07)
Basophils Absolute: 0.1 10*3/uL (ref 0.0–0.1)
Basophils Relative: 1 %
Eosinophils Absolute: 0.1 10*3/uL (ref 0.0–0.5)
Eosinophils Relative: 1 %
HCT: 37.5 % — ABNORMAL LOW (ref 39.0–52.0)
Hemoglobin: 12.2 g/dL — ABNORMAL LOW (ref 13.0–17.0)
Immature Granulocytes: 0 %
Lymphocytes Relative: 28 %
Lymphs Abs: 2.9 10*3/uL (ref 0.7–4.0)
MCH: 30.2 pg (ref 26.0–34.0)
MCHC: 32.5 g/dL (ref 30.0–36.0)
MCV: 92.8 fL (ref 80.0–100.0)
Monocytes Absolute: 0.9 10*3/uL (ref 0.1–1.0)
Monocytes Relative: 9 %
Neutro Abs: 6.5 10*3/uL (ref 1.7–7.7)
Neutrophils Relative %: 61 %
Platelets: 232 10*3/uL (ref 150–400)
RBC: 4.04 MIL/uL — ABNORMAL LOW (ref 4.22–5.81)
RDW: 13.3 % (ref 11.5–15.5)
WBC: 10.6 10*3/uL — ABNORMAL HIGH (ref 4.0–10.5)
nRBC: 0 % (ref 0.0–0.2)

## 2022-02-01 LAB — C-REACTIVE PROTEIN: CRP: 0.5 mg/dL (ref <1.0)

## 2022-02-01 LAB — SEDIMENTATION RATE: Sed Rate: 17 mm/hr (ref 0–20)

## 2022-02-01 LAB — NITRIC OXIDE: Nitric Oxide: 32

## 2022-02-01 MED ORDER — TRELEGY ELLIPTA 200-62.5-25 MCG/ACT IN AEPB: 1.0000 | INHALATION_SPRAY | Freq: Every day | RESPIRATORY_TRACT | 6 refills | Status: DC

## 2022-02-01 MED ORDER — TRELEGY ELLIPTA 200-62.5-25 MCG/ACT IN AEPB
1.0000 | INHALATION_SPRAY | Freq: Every day | RESPIRATORY_TRACT | 0 refills | Status: DC
Start: 2022-02-01 — End: 2022-03-12

## 2022-02-01 NOTE — Progress Notes (Signed)
Subjective:    Patient ID: Derek Hogan, male    DOB: 08/27/1945, 76 y.o.   MRN: [id number] Patient Care Team: Kirk Ruths, MD as PCP - General (Unknown Physician Specialty) Wellington Hampshire, MD as PCP - Cardiology (Cardiology) Bensimhon, Shaune Pascal, MD as PCP - Advanced Heart Failure (Cardiology) Tyler Pita, MD as Consulting Physician (Pulmonary Disease)  Chief Complaint  Patient presents with   Follow-up    Cough is worse since PFT on 01/24/2022. Cough with clear sputum. No SOB or wheezing.    HPI Patient is a 76 year old former smoker (105 PY) with a history as noted below, who presents for follow-up of a nonproductive cough.  He was first evaluated on 01 January 2022 for the details of the visit please refer to that note.  At that visit he was noted to have elevated nitric oxide suggestive of type II inflammation in the airway, PFTs were ordered, patient was given a trial of Trelegy Ellipta and as needed albuterol.  The patient responded with regards to his cough.  He however discontinued the Trelegy thinking that he was not going to need it.  The cough has since restarted after stopping the medication.  He has been out of Trelegy and did not notify us of this.  I have advised the patient that this is going to be a maintenance medication for him.  PFTs performed on 20 December showed minimal obstructive airways disease, mild restriction and moderate diffusion defect.  Prior CT chest has shown some interstitial changes as well as emphysema.   Will need workup for interstitial lung disease.  He has been doing quite a bit of woodwork generating a lot of sawdust without proper mask protection.  Hypersensitivity pneumonitis could be a cause.  This was explained to the patient and his wife who is with him today.   He has not had any recent fevers, chills or sweats.  No orthopnea or paroxysmal nocturnal dyspnea.  No lower extremity edema.  He has had an increase in  gastroesophageal reflux symptoms.  He is on PPI only once a day.    DATA 05/26/2021 echocardiogram: LVEF 60 to 65%, no regional wall motion abnormalities, grade 1 DD, right ventricular systolic function.  No valvular abnormalities. 06/30/2021 CT chest without contrast: Mild paraseptal emphysema, heterogeneous areas of peripheral reticulation and architectural distortion with bronchiectasis, lower lobe predominance UIP suspected.  Mild progression since 2012. 01/24/2022 PFTs: FEV1 2.69 L or 82% predicted, FVC 3.49 L or 77% predicted, FEV1/FVC 77%, there is mild bronchodilator response.  Lung volumes are mildly reduced.  Diffusion capacity mildly to moderately reduced.  Review of Systems A 10 point review of systems was performed and it is as noted above otherwise negative.  Patient Active Problem List   Diagnosis Date Noted   Cardiac arrhythmia, unspecified 01/01/2022   Anatomical narrow angle borderline glaucoma 06/29/2021   Cataract extraction status, unspecified eye 06/29/2021   Contusion of right foot 06/29/2021   Fracture of one rib, right side, initial encounter for closed fracture 06/29/2021   Insomnia 06/29/2021   Insulin pump status 06/29/2021   Lens replaced by other means 06/29/2021   Spondylosis without myelopathy or radiculopathy, lumbar region 06/29/2021   Onychomycosis 06/29/2021   Obesity 06/29/2021   Other low back pain 06/29/2021   Plantar fascial fibromatosis 06/29/2021   Sciatica 06/29/2021   Senile nuclear sclerosis 06/29/2021   Spinal stenosis of lumbar region 06/29/2021   Squamous cell carcinoma of skin 06/29/2021  Transient cerebral ischemic attack, unspecified 06/29/2021   OSA (obstructive sleep apnea) 05/30/2021   Myofascial pain 11/02/2020   Sacroiliitis ([place]) 11/02/2020   Contusion of foot 07/31/2018   Lacunar infarction ([place]) 12/05/2017   Peripheral vascular angioplasty status    PAD (peripheral artery disease) ([place]) 12/13/2014   GERD  (gastroesophageal reflux disease) 10/03/2013   Malaise and fatigue 06/16/2012   Cough 12/16/2011   Precordial pain 01/05/2011   Syncope 07/25/2010   Leukocytosis 07/25/2010   DIZZINESS 05/18/2009   MYOCARDIAL PERFUSION SCAN, WITH STRESS TEST, ABNORMAL 12/20/2008   SHORTNESS OF BREATH 11/25/2008   Insulin dependent type 2 diabetes mellitus ([place]) 10/01/2008   HYPOTENSION, ORTHOSTATIC 07/28/2008   Hyperlipidemia LDL goal <70 06/09/2008   ERECTILE DYSFUNCTION 06/09/2008   Essential hypertension 06/09/2008   Coronary atherosclerosis of native coronary artery 06/09/2008   Carotid stenosis 06/09/2008   HYPOTENSION, UNSPECIFIED 06/09/2008   Social History   Tobacco Use   Smoking status: Former    Packs/day: 3.00    Years: 35.00    Total pack years: 105.00    Types: Cigarettes    Quit date: 02/05/1994    Years since quitting: 28.0   Smokeless tobacco: Never  Substance Use Topics   Alcohol use: Not Currently   Allergies  Allergen Reactions   Simvastatin Swelling   Lisinopril Other (See Comments)    Unknown   Current Meds  Medication Sig   albuterol (VENTOLIN HFA) 108 (90 Base) MCG/ACT inhaler Inhale 2 puffs into the lungs every 6 (six) hours as needed for wheezing or shortness of breath (Cough).   aspirin 81 MG tablet Take 81 mg by mouth at bedtime.   atorvastatin (LIPITOR) 80 MG tablet Take 1 tablet (80 mg total) by mouth daily.   Calcium Carb-Cholecalciferol 600-10 MG-MCG TABS Take 2 tablets by mouth daily.   Calcium-Magnesium-Zinc (CAL-MAG-ZINC PO) Take 1 tablet by mouth daily.   carvedilol (COREG) 6.25 MG tablet Take 1 tablet (6.25 mg total) by mouth 2 (two) times daily.   ezetimibe (ZETIA) 10 MG tablet Take 10 mg by mouth daily.   Fluticasone-Umeclidin-Vilant (TRELEGY ELLIPTA) 100-62.5-25 MCG/ACT AEPB Inhale 1 puff into the lungs daily.   Fluticasone-Umeclidin-Vilant (TRELEGY ELLIPTA) 100-62.5-25 MCG/ACT AEPB Inhale 1 puff into the lungs daily.   glucagon 1 MG injection 1 mg  once as needed (low blood glucose).   hydrOXYzine (ATARAX/VISTARIL) 25 MG tablet Take 1 tablet by mouth daily as needed for anxiety.   insulin aspart (NOVOLOG) 100 unit/mL injection Inject into the skin continuous. INSULIN PUMP   ipratropium (ATROVENT) 0.06 % nasal spray Place 2 sprays into both nostrils 4 (four) times daily.   losartan (COZAAR) 100 MG tablet Take 100 mg by mouth daily.   methocarbamol (ROBAXIN) 500 MG tablet Take 500 mg by mouth daily as needed for muscle spasms.   nitroGLYCERIN (NITROSTAT) 0.4 MG SL tablet Place 0.4 mg under the tongue every 5 (five) minutes as needed for chest pain.    omeprazole (PRILOSEC) 20 MG capsule Take 20 mg by mouth every morning.    sertraline (ZOLOFT) 100 MG tablet Take 100 mg by mouth daily.   torsemide (DEMADEX) 20 MG tablet Take 20 mg by mouth every other day.   traZODone (DESYREL) 50 MG tablet Take 25 mg by mouth at bedtime.   Immunization History  Administered Date(s) Administered   COVID-19, mRNA, vaccine(Comirnaty)12 years and older 11/20/2021   Influenza, High Dose Seasonal PF 10/22/2017, 11/08/2018   Influenza, Seasonal, Injecte, Preservative Fre 10/30/2012   Influenza,inj,quad,  With Preservative 10/07/2018   Influenza-Unspecified 10/05/2013, 11/15/2013, 10/11/2014, 11/10/2014, 10/27/2015, 10/27/2016, 10/28/2016, 10/22/2017, 10/22/2018, 12/07/2019, 10/12/2020, 11/25/2020, 10/14/2021   PFIZER Comirnaty(Gray Top)Covid-19 Tri-Sucrose Vaccine 05/26/2019, 06/16/2019, 12/25/2019, 08/16/2020   PFIZER(Purple Top)SARS-COV-2 Vaccination 05/26/2019, 06/16/2019, 12/25/2019   Pfizer Covid-19 Vaccine Bivalent Booster 51yr & up 12/30/2020   Pneumococcal Conjugate-13 11/05/2012, 06/18/2014   Pneumococcal Polysaccharide-23 03/08/1997, 12/31/2003, 03/04/2009, 07/06/2015   Tdap 10/30/2012, 05/22/2021   Zoster Recombinat (Shingrix) 12/18/2018, 02/13/2019   Zoster, Live 07/31/2010      Objective:   Physical Exam BP 118/70 (BP Location: Left Arm, Cuff  Size: Normal)   Pulse 60   Temp (!) 97.3 F (36.3 C)   Ht 6' (1.829 m)   Wt 239 lb 3.2 oz (108.5 kg)   SpO2 98%   BMI 32.44 kg/m   SpO2: 98 % O2 Device: None (Room air)  GENERAL: Overweight gentleman, no acute distress fully ambulatory, no conversational dyspnea. HEAD: Normocephalic, atraumatic.  EYES: Pupils equal, round, reactive to light.  No scleral icterus.  MOUTH: Dentition intact, no thrush. NECK: Supple. No thyromegaly. Trachea midline. No JVD.  No adenopathy. PULMONARY: Good air entry bilaterally.  Coarse, otherwise no adventitious sounds. CARDIOVASCULAR: S1 and S2. Regular rate and rhythm.  No rubs, murmurs or gallops heard. ABDOMEN: Mildly protuberant otherwise benign. MUSCULOSKELETAL: No joint deformity, no clubbing, no edema.  NEUROLOGIC: No overt focal deficit, no gait disturbance, speech is fluent. SKIN: Intact,warm,dry. PSYCH: Mood and behavior normal   Recent Results:  Nitric oxide     Status: None   Collection Time: 01/01/22  9:30 AM  Result Value Ref Range   Nitric Oxide 51   Pulmonary Function Test [facility] Only     Status: None   Collection Time: 01/24/22  8:59 AM  Result Value Ref Range   FVC-Pre 3.49 L   FVC-%Pred-Pre 77 %   FVC-Post 3.56 L   FVC-%Pred-Post 79 %   FVC-%Change-Post 2 %   FEV1-Pre 2.69 L   FEV1-%Pred-Pre 82 %   FEV1-Post 2.99 L   FEV1-%Pred-Post 92 %   FEV1-%Change-Post 11 %   FEV6-Pre 3.49 L   FEV6-%Pred-Pre 82 %   FEV6-Post 3.56 L   FEV6-%Pred-Post 84 %   FEV6-%Change-Post 2 %   Pre FEV1/FVC ratio 77 %   FEV1FVC-%Pred-Pre 106 %   Post FEV1/FVC ratio 84 %   FEV1FVC-%Change-Post 8 %   Pre FEV6/FVC Ratio 100 %   FEV6FVC-%Pred-Pre 106 %   Post FEV6/FVC ratio 100 %   FEV6FVC-%Pred-Post 106 %   FEV6FVC-%Change-Post 0 %   FEF 25-75 Pre 2.58 L/sec   FEF2575-%Pred-Pre 111 %   FEF 25-75 Post 3.56 L/sec   FEF2575-%Pred-Post 153 %   FEF2575-%Change-Post 38 %   RV 1.59 L   RV % pred 58 %   TLC 5.09 L   TLC % pred 68 %   DLCO  unc 16.85 ml/min/mmHg   DLCO unc % pred 63 %   DL/VA 3.21 ml/min/mmHg/L   DL/VA % pred 82 %  Nitric oxide     Status: None   Collection Time: 02/01/22  8:37 AM  Result Value Ref Range   Nitric Oxide 32    Lab Results  Component Value Date   NITRICOXIDE 32 02/01/2022  Better control of type II inflammation prior nitric oxide was 51.     Assessment & Plan:     ICD-10-CM   1. Moderate persistent asthma without complication  JW10.27Nitric oxide    CBC with Differential/Platelet    IgE  Will increase Trelegy to 200 dose Continue Trelegy and as needed albuterol Check CBC with differential and IgE    2. Pulmonary fibrosis (HCC)  J84.10 Hypersensitivity Pneumonitis    ANA,IFA RA Diag Pnl w/rflx Tit/Patn    Scleroderma Diagnostic Profile    Sedimentation rate    C-reactive protein   Query hypersensitivity pneumonitis Query connective tissue disease Hypersensitivity pneumonitis panel, connective tissue disease workup    3. Chronic cough  R05.3    Mostly on recumbent position Query due to GERD/LPR Increase Prilosec to 2 times a day    4. Gastroesophageal reflux disease without esophagitis  K21.9    Increase Prilosec to 20 mg twice daily Antireflux measures     Orders Placed This Encounter  Procedures   CBC with Differential/Platelet    Standing Status:   Future    Standing Expiration Date:   02/02/2023   Hypersensitivity Pneumonitis    Standing Status:   Future    Standing Expiration Date:   08/03/2022   IgE    Standing Status:   Future    Standing Expiration Date:   08/03/2022   ANA,IFA RA Diag Pnl w/rflx Tit/Patn    Standing Status:   Future    Standing Expiration Date:   06/03/2022   Scleroderma Diagnostic Profile    Standing Status:   Future    Standing Expiration Date:   06/03/2022   Sedimentation rate    Standing Status:   Future    Standing Expiration Date:   06/03/2022   C-reactive protein    Standing Status:   Future    Standing Expiration Date:   06/03/2022    Nitric oxide   Meds ordered this encounter  Medications   Fluticasone-Umeclidin-Vilant (TRELEGY ELLIPTA) 200-62.5-25 MCG/ACT AEPB    Sig: Inhale 1 puff into the lungs daily.    Dispense:  28 each    Refill:  0    Order Specific Question:   Lot Number?    Answer:   [id number]    Order Specific Question:   Expiration Date?    Answer:   07/07/2023    Order Specific Question:   Quantity    Answer:   2   Fluticasone-Umeclidin-Vilant (TRELEGY ELLIPTA) 200-62.5-25 MCG/ACT AEPB    Sig: Inhale 1 puff into the lungs daily.    Dispense:  60 each    Refill:  6    [place] last 4 SSN: [id number]   Patient has several issues that may be causing his symptoms.  One issue is that of asthma and second issue is ILD.  Will initiate workup ILD with connective tissue disease workup.   Will increase Trelegy to 200/62.5/25, 1 inhalation daily.  Will see the patient in follow-up in 6 to 8 weeks time he is to contact us prior to that time should any new difficulties arise.

## 2022-02-01 NOTE — Patient Instructions (Signed)
We discussed today that you have 2 issues with your lung one is that you do have asthma the other one is that you have scarring otherwise known as fibrosis.  The asthma is moderate and the fibrosis appears to be mild.  Issues that can worsen the cough are of course asthma out-of-control or the fibrosis itself can cause the issue.  However reflux can worsen the cough particularly as you recline.  You may try increasing the omeprazole you are taking to 1 twice a day for a few weeks to see how you do cough wise.  We are increasing the strength of the anti-inflammatory in your inhaler to see if this helps with the cough as well.  I sent a prescription to the [facility] pharmacy as well.  You have enough samples to last you for a month.  This will be 1 puff daily make sure you rinse your mouth well after you use it.  Please wear a mask when you are working around wood.  We are getting blood work to check into the issues of potential allergy reaction to the wood dust and also to check for what is called connective tissue disease which is basically a circumstance in which your body rejects itself.  We will see you in follow-up in 6 to 8 weeks time call sooner should any new problems arise.

## 2022-02-02 LAB — SCLERODERMA DIAGNOSTIC PROFILE
Anti Nuclear Antibody (ANA): POSITIVE — AB
Scleroderma (Scl-70) (ENA) Antibody, IgG: 1.5 AI — ABNORMAL HIGH (ref 0.0–0.9)

## 2022-02-03 LAB — IGE: IgE (Immunoglobulin E), Serum: 107 IU/mL (ref 6–495)

## 2022-02-06 ENCOUNTER — Telehealth: Payer: Self-pay

## 2022-02-06 ENCOUNTER — Ambulatory Visit: Payer: Medicare Other | Admitting: Pulmonary Disease

## 2022-02-06 DIAGNOSIS — M349 Systemic sclerosis, unspecified: Secondary | ICD-10-CM

## 2022-02-06 NOTE — Telephone Encounter (Signed)
We are going to send a referral to rheumatology with the scleroderma results.  I was to do this today.  I recommend RSV shot.

## 2022-02-06 NOTE — Telephone Encounter (Signed)
[facility]  Is there anything else to do or take? I am also inquiring About the positive scleroderma results  . have I been checked for RSV and do you recommend taking the shot? Thank you and happy new year   Dr. Patsey Berthold, please advise. Patient in referring to Trelegy.

## 2022-02-06 NOTE — Telephone Encounter (Addendum)
Patient is aware of below message/recommendations and voiced his understanding.  Referral placed to rheumatology.  He will contact [place] and request list of trelegy alternatives. He will call back with update.  Nothing further needed.

## 2022-02-12 ENCOUNTER — Ambulatory Visit: Payer: Medicare Other | Admitting: Primary Care

## 2022-02-12 ENCOUNTER — Ambulatory Visit: Payer: Medicare Other | Admitting: Pulmonary Disease

## 2022-02-15 ENCOUNTER — Ambulatory Visit (INDEPENDENT_AMBULATORY_CARE_PROVIDER_SITE_OTHER): Payer: Medicare Other | Admitting: Podiatry

## 2022-02-15 ENCOUNTER — Encounter: Payer: Self-pay | Admitting: Podiatry

## 2022-02-15 VITALS — BP 154/67 | HR 60

## 2022-02-15 DIAGNOSIS — I739 Peripheral vascular disease, unspecified: Secondary | ICD-10-CM

## 2022-02-15 DIAGNOSIS — L603 Nail dystrophy: Secondary | ICD-10-CM | POA: Diagnosis not present

## 2022-02-15 NOTE — Progress Notes (Signed)
This patient presents to the office with thick painful left big toenail  This nail is painful walking and wearing his shoes.  He is planning a cruise and wants his big toenail left foot to look better.  Patient is diabetic with PAD.  Vascular  Dorsalis pedis and posterior tibial pulses are palpable  B/L.  Capillary return  WNL.  Temperature gradient is  WNL.  Skin turgor  WNL  Sensorium  Senn Weinstein monofilament wire  WNL. Normal tactile sensation.  Nail Exam  Patient has normal nails with no evidence of bacterial or fungal infection in all nails except his left big toenail which is thick disfigured and discolored.  Orthopedic  Exam  Muscle tone and muscle strength  WNL.  No limitations of motion feet  B/L.  No crepitus or joint effusion noted.  Foot type is unremarkable and digits show no abnormalities.  Bony prominences are unremarkable.  Skin  No open lesions.  Normal skin texture and turgor.   Nail Dystrophy left hallux toenail.  Debride nails with nail nipper followed by dremel tool.  RTC 3 months .   Gardiner Barefoot DPM

## 2022-03-05 ENCOUNTER — Ambulatory Visit (INDEPENDENT_AMBULATORY_CARE_PROVIDER_SITE_OTHER): Payer: Medicare Other | Admitting: Podiatry

## 2022-03-05 DIAGNOSIS — L03032 Cellulitis of left toe: Secondary | ICD-10-CM | POA: Diagnosis not present

## 2022-03-05 MED ORDER — GENTAMICIN SULFATE 0.1 % EX OINT: 1.0000 | TOPICAL_OINTMENT | Freq: Every day | CUTANEOUS | 0 refills | Status: DC

## 2022-03-05 MED ORDER — DOXYCYCLINE HYCLATE 100 MG PO TABS: 100.0000 mg | ORAL_TABLET | Freq: Two times a day (BID) | ORAL | 0 refills | Status: DC

## 2022-03-05 NOTE — Telephone Encounter (Signed)
Symbicort would be okay if the [place] does not have that we could try Breztri 2 puffs twice a day they may provide that.

## 2022-03-05 NOTE — Telephone Encounter (Signed)
Dr. Patsey Berthold, please advise on Symbicort. Thanks

## 2022-03-06 ENCOUNTER — Encounter: Payer: Self-pay | Admitting: Podiatry

## 2022-03-06 NOTE — Progress Notes (Signed)
  Subjective:  Patient ID: Derek Hogan, male    DOB: 1945/09/07,  MRN: [id number]  Chief Complaint  Patient presents with   Toe Pain    having pain in toe,  getting worse,  diabetic     77 y.o. male presents with the above complaint. History confirmed with patient.  He notes worsening redness and swelling on the great left toe.  Had it trimmed back a couple weeks ago has been painful since  Objective:  Physical Exam: Foot is warm and well-perfused, pulses are nonpalpable, serous drainage from medial nail fold of hallux with erythema     Assessment:   1. Paronychia of toe of left foot      Plan:  Patient was evaluated and treated and all questions answered.  Suspect he has a developing paronychia in setting of diabetes and PAD.  I recommend soaking twice daily in Epsom salt dressing with gentamicin ointment which I prescribed for him and sent to pharmacy.  Doxycycline Rx sent to pharmacy as well.  See me back in 2 weeks for follow-up, advised on signs and symptoms of worsening infection  Return in about 2 weeks (around 03/19/2022) for wound check.

## 2022-03-12 ENCOUNTER — Encounter: Payer: Self-pay | Admitting: Pulmonary Disease

## 2022-03-12 MED ORDER — TRELEGY ELLIPTA 200-62.5-25 MCG/ACT IN AEPB: 1.0000 | INHALATION_SPRAY | Freq: Every day | RESPIRATORY_TRACT | 0 refills | Status: DC

## 2022-03-12 MED ORDER — FLUTICASONE-SALMETEROL 250-50 MCG/ACT IN AEPB: 1.0000 | INHALATION_SPRAY | Freq: Two times a day (BID) | RESPIRATORY_TRACT | 2 refills | Status: AC

## 2022-03-12 NOTE — Telephone Encounter (Signed)
We can get him some samples of Trelegy 200 (2 boxes) to last him until the [place] can get his medication.  Trial of Wixela 250/51 inhalation twice a day this is the generic of Advair to see if this works.  This would have to be sent through the [facility].  Once he starts the [place] he would not use the Trelegy.

## 2022-03-19 ENCOUNTER — Ambulatory Visit (INDEPENDENT_AMBULATORY_CARE_PROVIDER_SITE_OTHER): Payer: Medicare Other | Admitting: Pulmonary Disease

## 2022-03-19 ENCOUNTER — Other Ambulatory Visit
Admission: RE | Admit: 2022-03-19 | Discharge: 2022-03-19 | Disposition: A | Discharge status: Home / Self Care | Payer: Medicare Other | Source: Ambulatory Visit | Attending: Pulmonary Disease | Admitting: Pulmonary Disease

## 2022-03-19 ENCOUNTER — Encounter: Payer: Self-pay | Admitting: Pulmonary Disease

## 2022-03-19 VITALS — BP 124/70 | HR 63 | Temp 97.3°F | Ht 72.0 in | Wt 234.8 lb

## 2022-03-19 DIAGNOSIS — J209 Acute bronchitis, unspecified: Secondary | ICD-10-CM

## 2022-03-19 DIAGNOSIS — J8410 Pulmonary fibrosis, unspecified: Secondary | ICD-10-CM | POA: Insufficient documentation

## 2022-03-19 DIAGNOSIS — M359 Systemic involvement of connective tissue, unspecified: Secondary | ICD-10-CM

## 2022-03-19 DIAGNOSIS — J4540 Moderate persistent asthma, uncomplicated: Secondary | ICD-10-CM

## 2022-03-19 DIAGNOSIS — J329 Chronic sinusitis, unspecified: Secondary | ICD-10-CM

## 2022-03-19 LAB — NITRIC OXIDE: Nitric Oxide: 16

## 2022-03-19 MED ORDER — AZITHROMYCIN 250 MG PO TABS: 250.0000 mg | ORAL_TABLET | Freq: Every day | ORAL | 0 refills | Status: DC

## 2022-03-19 NOTE — Progress Notes (Addendum)
Subjective:    Patient ID: Derek Hogan, male    DOB: 07-05-1945, 77 y.o.   MRN: [id number] Patient Care Team: Lauro Regulus, MD as PCP - General (Unknown Physician Specialty) Iran Ouch, MD as PCP - Cardiology (Cardiology) Bensimhon, [name], MD as PCP - Advanced Heart Failure (Cardiology) Salena Saner, MD as Consulting Physician (Pulmonary Disease)  Chief Complaint  Patient presents with   Follow-up    Congestion. Cough with clear sputum. No SOB. Wheezing.     HPI Patient is a 77 year old former smoker (105 PY) with a history as noted below, who presents for follow-up of cough.  He was first evaluated on 01 January 2022 for the details of the visit please refer to that note.  At that visit he was noted to have elevated nitric oxide suggestive of type II inflammation in the airway, PFTs were ordered, patient was given a trial of Trelegy Ellipta and as needed albuterol.  The patient responded with regards to his cough.  Subsequently PFTs performed on 20 December showed minimal obstructive airways disease, mild restriction and moderate diffusion defect.  Prior CT chest has shown pulmonary fibrosis in a pattern consistent with UIP as well as emphysema.    Will need workup for interstitial lung disease.  He has been doing quite a bit of woodwork generating a lot of sawdust without proper mask protection.  Hypersensitivity pneumonitis could be a cause.  Hypersensitivity pneumonitis panel was requested at its most recent visit of 01 February 2022 however, the lab did not draw this.  He did have CRP, ESR, IgE and ANA screen plus scleroderma antibody drawn.  Scleroderma antibody was positive.  Patient has been previously evaluated at the [facility] (June 2023) by rheumatology.  RNP antibodies were elevated at that time, scleroderma antibody was elevated.  No definitive diagnosis was made.  There is possible that we are dealing with any mixed connective tissue disease.  Today he  presents with a 3-day change in his cough pattern with cough productive of yellow sputum.  He has had some sick contacts at home.  No fevers or chills.  York Spaniel he has been having more nasal congestion than usual.  This is becoming worse over the last 3 to 4 months.  No hemoptysis. He has not had any recent orthopnea or paroxysmal nocturnal dyspnea.  No lower extremity edema.  He has had an increase in gastroesophageal reflux symptoms.  He is on PPI daily.  As noted previously he has a hobby of woodworking and is exposed to sawdust he is frequently without respirator protection.  At his prior visit we have provided him with Trelegy Ellipta 200.  [facility] does not cover this medication so he will be switched to Wixela 250/51 inhalation twice a day.  He already has procure the medication from the [place] however has not started using it yet as he is finishing his Trelegy samples.   DATA 05/26/2021 echocardiogram: LVEF 60 to 65%, no regional wall motion abnormalities, grade 1 DD, right ventricular systolic function.  No valvular abnormalities. 06/30/2021 CT chest without contrast: Mild paraseptal emphysema, heterogeneous areas of peripheral reticulation and architectural distortion with bronchiectasis, lower lobe predominance UIP suspected.  Mild progression since 2012. 01/24/2022 PFTs: FEV1 2.69 L or 82% predicted, FVC 3.49 L or 77% predicted, FEV1/FVC 77%, there is mild bronchodilator response.  Lung volumes are mildly reduced.  Diffusion capacity mildly to moderately reduced. 02/01/2022 IgE: Normal at 107 IU/mL 02/01/2022 connective tissue workup: ANA positive,  scleroderma antibody 1.5, sed rate 17 mm/h, CRP 0.5 mg/dL  Review of Systems A 10 point review of systems was performed and it is as noted above otherwise negative.  ` Patient Active Problem List   Diagnosis Date Noted   Dystrophic nail 02/15/2022   Cardiac arrhythmia, unspecified 01/01/2022   Anatomical narrow angle borderline glaucoma 06/29/2021    Cataract extraction status, unspecified eye 06/29/2021   Contusion of right foot 06/29/2021   Fracture of one rib, right side, initial encounter for closed fracture 06/29/2021   Insomnia 06/29/2021   Insulin pump status 06/29/2021   Lens replaced by other means 06/29/2021   Spondylosis without myelopathy or radiculopathy, lumbar region 06/29/2021   Onychomycosis 06/29/2021   Obesity 06/29/2021   Other low back pain 06/29/2021   Plantar fascial fibromatosis 06/29/2021   Sciatica 06/29/2021   Senile nuclear sclerosis 06/29/2021   Spinal stenosis of lumbar region 06/29/2021   Squamous cell carcinoma of skin 06/29/2021   Transient cerebral ischemic attack, unspecified 06/29/2021   OSA (obstructive sleep apnea) 05/30/2021   Myofascial pain 11/02/2020   Sacroiliitis (HCC) 11/02/2020   Contusion of foot 07/31/2018   Lacunar infarction (HCC) 12/05/2017   Peripheral vascular angioplasty status    PAD (peripheral artery disease) (HCC) 12/13/2014   GERD (gastroesophageal reflux disease) 10/03/2013   Malaise and fatigue 06/16/2012   Cough 12/16/2011   Precordial pain 01/05/2011   Syncope 07/25/2010   Leukocytosis 07/25/2010   DIZZINESS 05/18/2009   MYOCARDIAL PERFUSION SCAN, WITH STRESS TEST, ABNORMAL 12/20/2008   SHORTNESS OF BREATH 11/25/2008   Insulin dependent type 2 diabetes mellitus (HCC) 10/01/2008   HYPOTENSION, ORTHOSTATIC 07/28/2008   Hyperlipidemia LDL goal <70 06/09/2008   ERECTILE DYSFUNCTION 06/09/2008   Essential hypertension 06/09/2008   Coronary atherosclerosis of native coronary artery 06/09/2008   Carotid stenosis 06/09/2008   HYPOTENSION, UNSPECIFIED 06/09/2008   Social History   Tobacco Use   Smoking status: Former    Packs/day: 3.00    Years: 35.00    Total pack years: 105.00    Types: Cigarettes    Quit date: 02/05/1994    Years since quitting: 28.1   Smokeless tobacco: Never  Substance Use Topics   Alcohol use: Not Currently   Allergies  Allergen  Reactions   Simvastatin Swelling   Lisinopril Other (See Comments)    Unknown   Current Meds  Medication Sig   albuterol (VENTOLIN HFA) 108 (90 Base) MCG/ACT inhaler Inhale 2 puffs into the lungs every 6 (six) hours as needed for wheezing or shortness of breath (Cough).   aspirin 81 MG tablet Take 81 mg by mouth at bedtime.   atorvastatin (LIPITOR) 80 MG tablet Take 1 tablet (80 mg total) by mouth daily.   benzonatate (TESSALON) 100 MG capsule Take 2 capsules (200 mg total) by mouth every 8 (eight) hours.   Calcium Carb-Cholecalciferol 600-10 MG-MCG TABS Take 2 tablets by mouth daily.   Calcium-Magnesium-Zinc (CAL-MAG-ZINC PO) Take 1 tablet by mouth daily.   carvedilol (COREG) 6.25 MG tablet Take 1 tablet (6.25 mg total) by mouth 2 (two) times daily.   ezetimibe (ZETIA) 10 MG tablet Take 10 mg by mouth daily.   fluticasone-salmeterol (WIXELA INHUB) 250-50 MCG/ACT AEPB Inhale 1 puff into the lungs in the morning and at bedtime.   Fluticasone-Umeclidin-Vilant (TRELEGY ELLIPTA) 200-62.5-25 MCG/ACT AEPB Inhale 1 puff into the lungs daily.   gentamicin ointment (GARAMYCIN) 0.1 % Apply 1 Application topically daily.   glucagon 1 MG injection 1 mg once as needed (  low blood glucose).   hydrOXYzine (ATARAX/VISTARIL) 25 MG tablet Take 1 tablet by mouth daily as needed for anxiety.   insulin aspart (NOVOLOG) 100 unit/mL injection Inject into the skin continuous. INSULIN PUMP   ipratropium (ATROVENT) 0.06 % nasal spray Place 2 sprays into both nostrils 4 (four) times daily.   losartan (COZAAR) 100 MG tablet Take 100 mg by mouth daily.   methocarbamol (ROBAXIN) 500 MG tablet Take 500 mg by mouth daily as needed for muscle spasms.   nitroGLYCERIN (NITROSTAT) 0.4 MG SL tablet Place 0.4 mg under the tongue every 5 (five) minutes as needed for chest pain.    omeprazole (PRILOSEC) 20 MG capsule Take 20 mg by mouth every morning.    promethazine-dextromethorphan (PROMETHAZINE-DM) 6.25-15 MG/5ML syrup Take 5  mLs by mouth 4 (four) times daily as needed.   sertraline (ZOLOFT) 100 MG tablet Take 100 mg by mouth daily.   torsemide (DEMADEX) 20 MG tablet Take 20 mg by mouth every other day.   traZODone (DESYREL) 50 MG tablet Take 25 mg by mouth at bedtime.   Immunization History  Administered Date(s) Administered   COVID-19, mRNA, vaccine(Comirnaty)12 years and older 11/20/2021   Influenza, High Dose Seasonal PF 10/22/2017, 11/08/2018   Influenza, Seasonal, Injecte, Preservative Fre 10/30/2012   Influenza,inj,quad, With Preservative 10/07/2018   Influenza-Unspecified 10/05/2013, 11/15/2013, 10/11/2014, 11/10/2014, 10/27/2015, 10/27/2016, 10/28/2016, 10/22/2017, 10/22/2018, 12/07/2019, 10/12/2020, 11/25/2020, 10/14/2021   PFIZER Comirnaty(Gray Top)Covid-19 Tri-Sucrose Vaccine 05/26/2019, 06/16/2019, 12/25/2019, 08/16/2020   PFIZER(Purple Top)SARS-COV-2 Vaccination 05/26/2019, 06/16/2019, 12/25/2019   Pfizer Covid-19 Vaccine Bivalent Booster 109yrs & up 12/30/2020   Pneumococcal Conjugate-13 11/05/2012, 06/18/2014   Pneumococcal Polysaccharide-23 03/08/1997, 12/31/2003, 03/04/2009, 07/06/2015   Tdap 10/30/2012, 05/22/2021   Zoster Recombinat (Shingrix) 12/18/2018, 02/13/2019   Zoster, Live 07/31/2010       Objective:   Physical Exam BP 124/70 (BP Location: Left Arm, Cuff Size: Large)   Pulse 63   Temp (!) 97.3 F (36.3 C)   Ht 6' (1.829 m)   Wt 234 lb 12.8 oz (106.5 kg)   SpO2 99%   BMI 31.84 kg/m   SpO2: 99 % O2 Device: None (Room air)  GENERAL: Overweight gentleman, no acute distress fully ambulatory, no conversational dyspnea.  Does have a congested cough today HEAD: Normocephalic, atraumatic.  EYES: Pupils equal, round, reactive to light.  No scleral icterus.  MOUTH: Dentition intact, no thrush. NECK: Supple. No thyromegaly. Trachea midline. No JVD.  No adenopathy. PULMONARY: Good air entry bilaterally.  Coarse, rare end expiratory wheeze.  Rhonchi noted.   CARDIOVASCULAR: S1 and  S2. Regular rate and rhythm.  No rubs, murmurs or gallops heard. ABDOMEN: Mildly protuberant otherwise benign. MUSCULOSKELETAL: No joint deformity, no clubbing, no edema.  NEUROLOGIC: No overt focal deficit, no gait disturbance, speech is fluent. SKIN: Intact,warm,dry. PSYCH: Mood and behavior normal  Lab Results  Component Value Date   NITRICOXIDE 16 03/19/2022       Assessment & Plan:     ICD-10-CM   1. Moderate persistent asthma without complication  J45.40 Nitric oxide   Nitric oxide was normal today Continue LABA/ICS Albuterol as needed    2. Acute bronchitis, unspecified organism  J20.9    Azithromycin Z-Pak    3. Pulmonary fibrosis (HCC)  J84.10 ANA,IFA RA Diag Pnl w/rflx Tit/Patn    ANCA Profile    Hypersensitivity Pneumonitis    CANCELED: Hypersensitivity Pneumonitis    CANCELED: ANCA Profile    CANCELED: ANA,IFA RA Diag Pnl w/rflx Tit/Patn    CANCELED: Hypersensitivity Pneumonitis  Check hypersensitivity pneumonitis panel Check ANA panel/ANCA (sinopulm sxs)    4. Connective tissue disease (HCC)  M35.9 ANA,IFA RA Diag Pnl w/rflx Tit/Patn    ANCA Profile    Hypersensitivity Pneumonitis    CANCELED: Hypersensitivity Pneumonitis    CANCELED: ANCA Profile    CANCELED: ANA,IFA RA Diag Pnl w/rflx Tit/Patn    CANCELED: Hypersensitivity Pneumonitis   Ill characterized Rheumatology consult/follow-up pending    5. Recurrent rhinosinusitis  J32.9    Azithromycin as above Nasal hygiene     Orders Placed This Encounter  Procedures   Hypersensitivity Pneumonitis    Standing Status:   Future    Standing Expiration Date:   03/20/2023   ANA,IFA RA Diag Pnl w/rflx Tit/Patn    Standing Status:   Future    Standing Expiration Date:   03/20/2023   ANCA Profile    Standing Status:   Future    Standing Expiration Date:   03/20/2023   Nitric oxide   Meds ordered this encounter  Medications   azithromycin (ZITHROMAX Z-PAK) 250 MG tablet    Sig: Take 1 tablet (250 mg  total) by mouth daily. Take 2 tablets on the first day and then 1 tablet daily thereafter for a total of 5 days of treatment.    Dispense:  6 tablet    Refill:  0   Will see the patient in follow-up in 3 to 4 weeks time call sooner should any new problems arise.  Gailen Shelter, MD Advanced Bronchoscopy PCCM [facility]    *This note was dictated using voice recognition software/Dragon.  Despite best efforts to proofread, errors can occur which can change the meaning. Any transcriptional errors that result from this process are unintentional and may not be fully corrected at the time of dictation.

## 2022-03-19 NOTE — Patient Instructions (Signed)
Your level of inflammation in the lung was low today which is good.  You have a bronchitis, we sent an antibiotic to the [facility].  We placed an order for the blood work that was not done previously.  We will see you in follow-up in 3 to 4 weeks time call sooner should any new problems arise.

## 2022-03-21 ENCOUNTER — Ambulatory Visit (INDEPENDENT_AMBULATORY_CARE_PROVIDER_SITE_OTHER): Payer: Medicare Other | Admitting: Podiatry

## 2022-03-21 ENCOUNTER — Encounter: Payer: Self-pay | Admitting: Podiatry

## 2022-03-21 VITALS — BP 128/49 | HR 63

## 2022-03-21 DIAGNOSIS — L03032 Cellulitis of left toe: Secondary | ICD-10-CM

## 2022-03-21 LAB — ANTINUCLEAR ANTIBODIES, IFA: ANA Ab, IFA: NEGATIVE

## 2022-03-21 LAB — ANCA PROFILE
Anti-MPO Antibodies: <0.2 units (ref 0.0–0.9)
Anti-PR3 Antibodies: <0.2 units (ref 0.0–0.9)
Atypical P-ANCA titer: <1:20 {titer}
C-ANCA: <1:20 {titer}
P-ANCA: <1:20 {titer}

## 2022-03-26 NOTE — Progress Notes (Signed)
  Subjective:  Patient ID: Derek Hogan, male    DOB: [birth year],  MRN: [id number]  Chief Complaint  Patient presents with   Wound Check    "It's still hurting.  He's going to fix it today."    77 y.o. male presents with the above complaint. History confirmed with patient.  He says it is doing somewhat better but is still hurting some and red  Objective:  Physical Exam: Foot is warm and well-perfused, pulses are nonpalpable, less erythema today, mild tenderness to the medial border     Assessment:   1. Paronychia of toe of left foot      Plan:  Patient was evaluated and treated and all questions answered.  Improved but still need to continue soaking and ointment.  I will see him back in 1 month to reevaluate.  Advised on worsening infection and need for further antibiotics  Return in about 1 month (around 04/19/2022) for nail re-check.

## 2022-03-29 ENCOUNTER — Ambulatory Visit: Payer: Medicare Other | Attending: Cardiovascular Disease | Admitting: Cardiovascular Disease

## 2022-03-29 ENCOUNTER — Encounter: Payer: Self-pay | Admitting: Cardiovascular Disease

## 2022-03-29 VITALS — BP 100/60 | HR 62 | Ht 72.0 in | Wt 237.1 lb

## 2022-03-29 DIAGNOSIS — I739 Peripheral vascular disease, unspecified: Secondary | ICD-10-CM

## 2022-03-29 DIAGNOSIS — I872 Venous insufficiency (chronic) (peripheral): Secondary | ICD-10-CM

## 2022-03-29 DIAGNOSIS — E785 Hyperlipidemia, unspecified: Secondary | ICD-10-CM | POA: Diagnosis not present

## 2022-03-29 DIAGNOSIS — I779 Disorder of arteries and arterioles, unspecified: Secondary | ICD-10-CM

## 2022-03-29 DIAGNOSIS — I10 Essential (primary) hypertension: Secondary | ICD-10-CM | POA: Diagnosis present

## 2022-03-29 DIAGNOSIS — I2510 Atherosclerotic heart disease of native coronary artery without angina pectoris: Secondary | ICD-10-CM | POA: Diagnosis present

## 2022-03-29 NOTE — Addendum Note (Signed)
Addended by: Britt Bottom on: 03/29/2022 03:22 PM   Modules accepted: Orders

## 2022-03-29 NOTE — Progress Notes (Signed)
Cardiology Office Note   Date:  03/29/2022   ID:  Derek Hogan, Derek Hogan Aug 06, 1945, MRN [id number]  PCP:  Kirk Ruths, MD  Cardiologist:  Dr. Fletcher Anon  Chief Complaint  Patient presents with   Other    F/u ABI/LE c/o cough. Meds reviewed verbally with pt.      History of Present Illness: Derek Hogan is a 77 y.o. male who presents for a follow-up visit regarding coronary artery disease and peripheral arterial disease.  He has known history of CAD S/P previous inferior wall myocardial infarction followed by CABG in 1996. He had questionable TIA in March 2016. He quit smoking in 1996 after CABG. He has prolonged history of diabetes currently on insulin pump, Carotid disease status post right carotid endarterectomy, hyperlipidemia and hypertension. He is known to have peripheral arterial disease.  He had drug-coated balloon angioplasty followed by self-expanding stent placement to the proximal right popliteal artery for severe claudication in November 2016. He has nonhealing ulceration in the left big toe in 2023.  Angiography in May 2023 showed no significant aortoiliac disease.  On the left side, there was moderately calcified common femoral artery stenosis, mild nonobstructive SFA disease and severe calcified proximal popliteal artery disease with three-vessel runoff below the knee.  I performed successful orbital atherectomy and drug-coated balloon angioplasty to the left popliteal artery.   Echocardiogram in August 2022 showed normal LV systolic function with grade 1 diastolic dysfunction and no significant valvular abnormalities.    He had sleep study done at the [place] which showed mild obstructive sleep apnea.    He had some dizziness in late 2023 and thus was evaluated at the [place] with a brain MRI which showed no acute infarct.  He was noted to have chronic right thalamus and left cerebellar lacunar infarcts in addition to microvascular ischemic disease with scattered  cerebral microhemorrhages suggestive of amyloid angiopathy  He has been doing reasonably well from a cardiac standpoint with no chest pain.  He continues to deal with shortness of breath and dry cough thought to be due to lung disease.  He is followed by Dr. Patsey Berthold.  He had a small ulceration at the left big toe after part of his nail was removed.  He follows with podiatry and seems to be improving.    Past Medical History:  Diagnosis Date   CAD, ARTERY BYPASS GRAFT    a. 1996 s/p CABG x 4 (LIMA->LAD, VG->D1, VG->OM3, VG->RPDA; b. 12/2016 [facility]): EF 47%, anteroapical and inf infarcts w/ anteroapical peri-infarct ischemia; c. 01/2017: Cath: LM 35md, LAD 100p/m, D1 90ost, 70p, LCX 80ost/p, 108mRCA 4016m5d, RPDA 95ost, VG->RPDA 30p/m, VG->OM3 nl, LIMA->LAD nl, VG->D1 nl; d. 06/2019 [facility]: EF 52%, basal inf/mid ant/mid inf/apical ant/apical infarct w/ ischemia.   Carotid arterial disease ([facility]  a. s/p remote R CEA (Dr. GreDrucie Opitzb. 04/2021 Carotid U/S: 1-30% bilat ICA stenoses.   Diastolic dysfunction    a. 05/2021 Echo: EF 60-65%, no rwma, GrI DD, nl RV fxn.   Dyslipidemia    Erectile dysfunction    GERD (gastroesophageal reflux disease)    Heart attack ([facility]CC[facility]996   HYPERLIPIDEMIA-MIXED 06/09/2008   Hypertension    HYPERTENSION, UNSPECIFIED 06/09/2008   Orthostatic hypotension    Palpitations    a. 04/2021 [facility]: Predominantly RSR @ 77 (60-144), 5 SVT runs - fastest 144, longest 20 beats. Occas PVCs - 5%.   PVD (peripheral vascular disease) ([facility]CC[facility]ard  a.  02/2014 s/p DBA/stent to prox R popliteal; b. 06/2021 orbital atherectomy and DCBA to L popliteal (90%). Otw moderate, nonobs PAD.   Transient ischemic attack (TIA)    Type I diabetes mellitus ([place]) dx'd 08/20/1966   Viral gastroenteritis    04/19/2010 improved   Weakness     Past Surgical History:  Procedure Laterality Date   ABDOMINAL AORTOGRAM W/LOWER EXTREMITY N/A 06/07/2021   Procedure: ABDOMINAL AORTOGRAM W/LOWER EXTREMITY;   Surgeon: Wellington Hampshire, MD;  Location: [place] [facility];  Service: Cardiovascular;  Laterality: N/A;   CARDIAC CATHETERIZATION  1996; ~ 2010   CAROTID ENDARTERECTOMY Right    CATARACT EXTRACTION, BILATERAL Bilateral    COLONOSCOPY WITH PROPOFOL N/A 06/07/2020   Procedure: COLONOSCOPY WITH PROPOFOL;  Surgeon: Lesly Rubenstein, MD;  Location: [facility] ENDOSCOPY;  Service: Endoscopy;  Laterality: N/A;   CORONARY ANGIOPLASTY     CORONARY ARTERY BYPASS GRAFT  1996   "CABG X4"   ESOPHAGOGASTRODUODENOSCOPY N/A 06/07/2020   Procedure: ESOPHAGOGASTRODUODENOSCOPY (EGD);  Surgeon: Lesly Rubenstein, MD;  Location: [facility] ENDOSCOPY;  Service: Endoscopy;  Laterality: N/A;   LEFT HEART CATH AND CORS/GRAFTS ANGIOGRAPHY N/A 01/11/2017   Procedure: LEFT HEART CATH AND CORS/GRAFTS ANGIOGRAPHY;  Surgeon: Jolaine Artist, MD;  Location: [place] [facility];  Service: Cardiovascular;  Laterality: N/A;   MOHS SURGERY     PERIPHERAL VASCULAR ATHERECTOMY  06/07/2021   Procedure: PERIPHERAL VASCULAR ATHERECTOMY;  Surgeon: Wellington Hampshire, MD;  Location: [place] [facility];  Service: Cardiovascular;;   PERIPHERAL VASCULAR CATHETERIZATION N/A 12/15/2014   Procedure: Abdominal Aortogram;  Surgeon: Wellington Hampshire, MD;  Location: [place] [facility];  Service: Cardiovascular;  Laterality: N/A;   REFRACTIVE SURGERY Right    "before cataract OR"     Current Outpatient Medications  Medication Sig Dispense Refill   albuterol (VENTOLIN HFA) 108 (90 Base) MCG/ACT inhaler Inhale 2 puffs into the lungs every 6 (six) hours as needed for wheezing or shortness of breath (Cough). 8 g 2   aspirin 81 MG tablet Take 81 mg by mouth at bedtime.     atorvastatin (LIPITOR) 80 MG tablet Take 1 tablet (80 mg total) by mouth daily.     Calcium Carb-Cholecalciferol 600-10 MG-MCG TABS Take 2 tablets by mouth daily.     Calcium-Magnesium-Zinc (CAL-MAG-ZINC PO) Take 1 tablet by mouth daily.     carvedilol (COREG) 6.25 MG tablet Take 1  tablet (6.25 mg total) by mouth 2 (two) times daily. 180 tablet 3   ezetimibe (ZETIA) 10 MG tablet Take 10 mg by mouth daily.     fluticasone-salmeterol (WIXELA INHUB) 250-50 MCG/ACT AEPB Inhale 1 puff into the lungs in the morning and at bedtime. 60 each 2   glucagon 1 MG injection 1 mg once as needed (low blood glucose).     insulin aspart (NOVOLOG) 100 unit/mL injection Inject into the skin continuous. INSULIN PUMP     losartan (COZAAR) 100 MG tablet Take 100 mg by mouth daily.     nitroGLYCERIN (NITROSTAT) 0.4 MG SL tablet Place 0.4 mg under the tongue every 5 (five) minutes as needed for chest pain.      omeprazole (PRILOSEC) 20 MG capsule Take 20 mg by mouth every morning.      sertraline (ZOLOFT) 100 MG tablet Take 100 mg by mouth daily.     torsemide (DEMADEX) 20 MG tablet Take 20 mg by mouth every other day.     traZODone (DESYREL) 50 MG tablet Take 25 mg by  mouth at bedtime.     No current facility-administered medications for this visit.    Allergies:   Simvastatin and Lisinopril    Social History:  The patient  reports that he quit smoking about 28 years ago. His smoking use included cigarettes. He has a 105.00 pack-year smoking history. He has never used smokeless tobacco. He reports that he does not currently use alcohol. He reports that he does not use drugs.   Family History:  The patient's family history includes Coronary artery disease in his father and mother; Diabetes in his father and mother; Stroke in an other family member.    ROS:  Please see the history of present illness.   Otherwise, review of systems are positive for none.   All other systems are reviewed and negative.    PHYSICAL EXAM: VS:  BP 100/60 (BP Location: Left Arm, Patient Position: Sitting, Cuff Size: Normal)   Pulse 62   Ht 6' (1.829 m)   Wt 237 lb 2 oz (107.6 kg)   SpO2 98%   BMI 32.16 kg/m  , BMI Body mass index is 32.16 kg/m. GEN: Well nourished, well developed, in no acute distress   HEENT: normal  Neck: no JVD, or masses. Right carotid bruit Cardiac: RRR; no murmurs, rubs, or gallops, trace right leg swelling. Respiratory:  clear to auscultation bilaterally, normal work of breathing GI: soft, nontender, nondistended, + BS MS: no deformity or atrophy  Skin: warm and dry, no rash Neuro:  Strength and sensation are intact Psych: euthymic mood, full affect Vascular: Dorsalis pedis and posterior tibial on the left side are faint palpable.     EKG:  EKG is ordered today. EKG showed normal sinus rhythm with possible old inferior infarct.   Recent Labs: 06/13/2021: B Natriuretic Peptide 165.4 06/29/2021: BUN 19; Creatinine, Ser 1.24; Potassium 4.4; Sodium 136 02/01/2022: Hemoglobin 12.2; Platelets 232    Lipid Panel    Component Value Date/Time   CHOL 116 07/08/2014 0830   TRIG 51 07/08/2014 0830   HDL 43 07/08/2014 0830   CHOLHDL 2.7 07/08/2014 0830   CHOLHDL 2.7 07/19/2010 0839   VLDL 10 07/19/2010 0839   LDLCALC 63 07/08/2014 0830      Wt Readings from Last 3 Encounters:  03/29/22 237 lb 2 oz (107.6 kg)  03/19/22 234 lb 12.8 oz (106.5 kg)  02/01/22 239 lb 3.2 oz (108.5 kg)        ASSESSMENT AND PLAN:  1.  Peripheral arterial disease: Status post angioplasty and stent placement to the right popliteal artery.  Status post orbital atherectomy and drug-coated balloon angioplasty of the left popliteal artery.   No plans to resume small dose Xarelto at this point due to concerns about cerebral microhemorrhages. Most recent duplex in November showed moderate stenosis in the right proximal SFA and patent left popliteal artery.  Repeat Doppler studies in November 2024. His distal pulses are harder to palpate due to calcified vessels and he likely has microvascular disease related to diabetes as well as evidenced by his abnormal toe pressure.  2. Coronary artery disease involving native coronary arteries without angina: He reports improvement in exertional  dyspnea.   Most recent [place] in 2021 was low risk.    3. Hyperlipidemia: Continue treatment with high dose atorvastatin with a target LDL of less than 70.   4. Carotid disease status post right carotid endarterectomy: Most recent Doppler in April showed less than 40% stenosis bilaterally.  5.  Chronic venous insufficiency affecting  the right lower extremity: Continue support stockings.  Symptoms are stable.  6.  Essential hypertension: Blood pressures controlled on current medications.    Disposition: Follow-up in 6 month.   Signed,  Kathlyn Sacramento, MD  03/29/2022 8:18 AM    [place]

## 2022-03-29 NOTE — Patient Instructions (Signed)
Medication Instructions:  No changes *If you need a refill on your cardiac medications before your next appointment, please call your pharmacy*   Lab Work: None ordered If you have labs (blood work) drawn today and your tests are completely normal, you will receive your results only by: [place] (if you have MyChart) OR A paper copy in the mail If you have any lab test that is abnormal or we need to change your treatment, we will call you to review the results.   Testing/Procedures: None ordered   Follow-Up: At [facility], you and your health needs are our priority.  As part of our continuing mission to provide you with exceptional heart care, we have created designated Provider Care Teams.  These Care Teams include your primary Cardiologist (physician) and Advanced Practice Providers (APPs -  Physician Assistants and Nurse Practitioners) who all work together to provide you with the care you need, when you need it.  We recommend signing up for the patient portal called "MyChart".  Sign up information is provided on this After Visit Summary.  MyChart is used to connect with patients for Virtual Visits (Telemedicine).  Patients are able to view lab/test results, encounter notes, upcoming appointments, etc.  Non-urgent messages can be sent to your provider as well.   To learn more about what you can do with MyChart, go to [email or web address].    Your next appointment:   6 month(s)  Provider:   You may see Kathlyn Sacramento, MD or one of the following Advanced Practice Providers on your designated Care Team:   Murray Hodgkins, NP Christell Faith, PA-C Cadence Kathlen Mody, PA-C Gerrie Nordmann, NP

## 2022-04-02 ENCOUNTER — Encounter: Payer: Self-pay | Admitting: Pulmonary Disease

## 2022-04-23 ENCOUNTER — Ambulatory Visit (INDEPENDENT_AMBULATORY_CARE_PROVIDER_SITE_OTHER): Payer: Medicare Other | Admitting: Podiatry

## 2022-04-23 DIAGNOSIS — M79674 Pain in right toe(s): Secondary | ICD-10-CM | POA: Diagnosis not present

## 2022-04-23 DIAGNOSIS — I739 Peripheral vascular disease, unspecified: Secondary | ICD-10-CM

## 2022-04-23 DIAGNOSIS — L97521 Non-pressure chronic ulcer of other part of left foot limited to breakdown of skin: Secondary | ICD-10-CM | POA: Diagnosis not present

## 2022-04-23 DIAGNOSIS — B351 Tinea unguium: Secondary | ICD-10-CM | POA: Diagnosis not present

## 2022-04-23 DIAGNOSIS — M79675 Pain in left toe(s): Secondary | ICD-10-CM | POA: Diagnosis not present

## 2022-04-23 NOTE — Progress Notes (Signed)
  Subjective:  Patient ID: Derek Hogan, male    DOB: 06-30-1945,  MRN: [id number]  Chief Complaint  Patient presents with   Foot Ulcer    Left great toe follow up   Nail Problem    Nail trim    77 y.o. male presents with the above complaint. History confirmed with patient.  He says it is doing about the same.  He saw Dr. Fletcher Anon for follow-up.  Nails on the right foot are thickened and elongated and have not been cut in some time  Objective:  Physical Exam: Foot is warm and well-perfused, pulses are nonpalpable, minimal erythema today.  Small scab present distal medial hallux, no signs of infection, no drainage.  Thickened elongated dystrophic nails with subungual debris on the right foot 1 through 5     Assessment:   1. Ulcer of great toe, left, limited to breakdown of skin ([place])   2. Pain due to onychomycosis of toenails of both feet   3. PAD (peripheral artery disease) ([place])      Plan:  Patient was evaluated and treated and all questions answered.  Ulceration continues to improve and is healing gradually but slowly. Dr Fletcher Anon has recommended monitoring his progress.  If it worsens or does not heal then I will discuss with him returning for further angiography.  Continue utilizing antibiotic ointment.  I advised him to use this daily he has not been doing it consistently.  Discussed the etiology and treatment options for the condition in detail with the patient. Educated patient on the topical and oral treatment options for mycotic nails.  Previous debridement has been helpful. Sharp and mechanical debridement performed of all painful and mycotic nails today. Nails debrided in length and thickness using a nail nipper to level of comfort. Discussed treatment options including appropriate shoe gear. Follow up as needed for painful nails.    Return in about 1 month (around 05/24/2022) for wound care.

## 2022-04-23 NOTE — Patient Instructions (Signed)
Change the bandage daily with ointment on the toe

## 2022-04-25 ENCOUNTER — Ambulatory Visit: Payer: Medicare Other | Admitting: Pulmonary Disease

## 2022-05-03 DIAGNOSIS — R7689 Other specified abnormal immunological findings in serum: Secondary | ICD-10-CM | POA: Insufficient documentation

## 2022-05-09 ENCOUNTER — Ambulatory Visit (INDEPENDENT_AMBULATORY_CARE_PROVIDER_SITE_OTHER): Payer: Medicare Other | Admitting: Podiatry

## 2022-05-09 DIAGNOSIS — L97521 Non-pressure chronic ulcer of other part of left foot limited to breakdown of skin: Secondary | ICD-10-CM

## 2022-05-09 DIAGNOSIS — I739 Peripheral vascular disease, unspecified: Secondary | ICD-10-CM

## 2022-05-09 MED ORDER — AMOXICILLIN-POT CLAVULANATE 875-125 MG PO TABS: 1.0000 | ORAL_TABLET | Freq: Two times a day (BID) | ORAL | 0 refills | Status: DC

## 2022-05-14 NOTE — Progress Notes (Signed)
  Subjective:  Patient ID: Derek Hogan, male    DOB: 1945-05-02,  MRN: [id number]  Chief Complaint  Patient presents with   Toe Pain    left great toe pain has came back/ has history of wound    77 y.o. male presents with the above complaint. History confirmed with patient.  He says it is doing about the same pain is still present  Objective:  Physical Exam: Foot is warm and well-perfused, pulses are nonpalpable, minimal erythema today.  Small ulceration at distal tip of hallux with scab present, some erythema today, tender     Assessment:   1. Ulcer of great toe, left, limited to breakdown of skin   2. PAD (peripheral artery disease)      Plan:  Patient was evaluated and treated and all questions answered.  Most of his pain and erythema I think likely is related to his PAD.  I did place him on Augmentin, I will see him back in 2 weeks for follow-up.  We discussed again with his significant microvascular disease expect this will take quite a long time for it to heal up completely.   Return in about 2 weeks (around 05/23/2022) for wound care.

## 2022-05-17 ENCOUNTER — Ambulatory Visit: Payer: Medicare Other | Admitting: Podiatry

## 2022-06-04 ENCOUNTER — Ambulatory Visit: Payer: Medicare Other | Admitting: Podiatry

## 2022-06-08 ENCOUNTER — Telehealth: Payer: Self-pay | Admitting: Podiatry

## 2022-06-08 NOTE — Telephone Encounter (Signed)
Pt called and canceled his wound care follow up appt and did not want to r/s

## 2022-06-13 ENCOUNTER — Ambulatory Visit: Payer: Medicare Other | Admitting: Podiatry

## 2022-08-03 ENCOUNTER — Telehealth: Payer: Self-pay | Admitting: Cardiovascular Disease

## 2022-08-03 NOTE — Telephone Encounter (Signed)
Patient came by office requesting to speak with nurse Dropped off results to be reviewed from the [place] (paper and CD) Placed in nurse box

## 2022-08-03 NOTE — Telephone Encounter (Signed)
The patient came into the office today with a CD and results from a MRI and CT that he had completed through the [place] for dizziness. He has concerns about the results and would like for Dr. Kirke Corin to review them.

## 2022-08-06 ENCOUNTER — Encounter: Payer: Self-pay | Admitting: Cardiovascular Disease

## 2022-08-07 NOTE — Telephone Encounter (Signed)
I reviewed studies.  He does have significant stenosis in the right vertebral artery but his left vertebral artery is dominant with no obstructive disease.  Most of the time, this does not cause any issues.  However, if he is still having significant dizziness, it might be worth being evaluated by a neuro interventionalist.  Dr. Corliss Skains in [place] would be my recommendation if this has not been addressed.

## 2022-08-08 ENCOUNTER — Encounter: Payer: Self-pay | Admitting: Cardiovascular Disease

## 2022-08-08 ENCOUNTER — Ambulatory Visit: Payer: Medicare Other | Attending: Cardiovascular Disease | Admitting: Cardiovascular Disease

## 2022-08-08 VITALS — BP 116/80 | HR 63 | Ht 72.0 in | Wt 234.6 lb

## 2022-08-08 DIAGNOSIS — E785 Hyperlipidemia, unspecified: Secondary | ICD-10-CM

## 2022-08-08 DIAGNOSIS — I10 Essential (primary) hypertension: Secondary | ICD-10-CM | POA: Diagnosis not present

## 2022-08-08 DIAGNOSIS — I2510 Atherosclerotic heart disease of native coronary artery without angina pectoris: Secondary | ICD-10-CM

## 2022-08-08 DIAGNOSIS — I739 Peripheral vascular disease, unspecified: Secondary | ICD-10-CM | POA: Diagnosis present

## 2022-08-08 NOTE — Patient Instructions (Signed)
Medication Instructions:  No changes *If you need a refill on your cardiac medications before your next appointment, please call your pharmacy*   Lab Work: None ordered If you have labs (blood work) drawn today and your tests are completely normal, you will receive your results only by: MyChart Message (if you have MyChart) OR A paper copy in the mail If you have any lab test that is abnormal or we need to change your treatment, we will call you to review the results.   Testing/Procedures: Your physician has requested that you have a lower extremity arterial duplex. During this test, ultrasound is used to evaluate arterial blood flow in the legs. Allow one hour for this exam. There are no restrictions or special instructions. This will take place at [address], [place] [address]  Your physician has requested that you have an ankle brachial index (ABI). During this test an ultrasound and blood pressure cuff are used to evaluate the arteries that supply the arms and legs with blood.  Allow thirty minutes for this exam.  There are no restrictions or special instructions.  This will take place at [address] ([address], [place] [address]    Follow-Up: At [facility], you and your health needs are our priority.  As part of our continuing mission to provide you with exceptional heart care, we have created designated Provider Care Teams.  These Care Teams include your primary Cardiologist (physician) and Advanced Practice Providers (APPs -  Physician Assistants and Nurse Practitioners) who all work together to provide you with the care you need, when you need it.  We recommend signing up for the patient portal called "MyChart".  Sign up information is provided on this After Visit Summary.  MyChart is used to connect with patients for Virtual Visits (Telemedicine).  Patients are able to view lab/test results, encounter notes,  upcoming appointments, etc.  Non-urgent messages can be sent to your provider as well.   To learn more about what you can do with MyChart, go to [email or web address].    Your next appointment:   6 month(s)  Provider:   You may see Lorine Bears, MD or one of the following Advanced Practice Providers on your designated Care Team:   Nicolasa Ducking, NP Eula Listen, PA-C Cadence Fransico Michael, PA-C Charlsie Quest, NP    Other Instructions Jamelle Rushing at [phone]

## 2022-08-08 NOTE — Progress Notes (Signed)
Cardiology Office Note   Date:  08/08/2022   ID:  Derek Hogan, DOB 1945-03-29, MRN [id number]  PCP:  Lauro Regulus, MD  Cardiologist:  Dr. Kirke Corin  Chief Complaint  Patient presents with   Follow-up    Patient here to discuss images from outside source that was reviewed by Dr. Kirke Corin.      History of Present Illness: Derek Hogan is a 77 y.o. male who presents for a follow-up visit regarding coronary artery disease and peripheral arterial disease.  He has known history of CAD S/P previous inferior wall myocardial infarction followed by CABG in 1996. He had questionable TIA in March 2016. He quit smoking in 1996 after CABG. He has prolonged history of diabetes currently on insulin pump, Carotid disease status post right carotid endarterectomy, hyperlipidemia and hypertension. He is known to have peripheral arterial disease.  He had drug-coated balloon angioplasty followed by self-expanding stent placement to the proximal right popliteal artery for severe claudication in November 2016. He has nonhealing ulceration in the left big toe in 2023.  Angiography in May 2023 showed no significant aortoiliac disease.  On the left side, there was moderately calcified common femoral artery stenosis, mild nonobstructive SFA disease and severe calcified proximal popliteal artery disease with three-vessel runoff below the knee.  I performed successful orbital atherectomy and drug-coated balloon angioplasty to the left popliteal artery.   Echocardiogram in August 2022 showed normal LV systolic function with grade 1 diastolic dysfunction and no significant valvular abnormalities.    He had sleep study done at the [place] which showed mild obstructive sleep apnea.    He had some dizziness in late 2023 and thus was evaluated at the [place] with a brain MRI which showed no acute infarct.  He was noted to have chronic right thalamus and left cerebellar lacunar infarcts in addition to microvascular  ischemic disease with scattered cerebral microhemorrhages suggestive of amyloid angiopathy  He has a small ulceration on the left big toe that continues to improve.  He continues to have dizziness described as spinning and poor balance.  He had CTA done at the [place] which showed significant stenosis in the right vertebral artery but the left vertebral artery was dominant with no obstructive disease.  He is planning on anniversary trip to [place] in September.   Past Medical History:  Diagnosis Date   CAD, ARTERY BYPASS GRAFT    a. 1996 s/p CABG x 4 (LIMA->LAD, VG->D1, VG->OM3, VG->RPDA; b. 12/2016 [facility]): EF 47%, anteroapical and inf infarcts w/ anteroapical peri-infarct ischemia; c. 01/2017: Cath: LM 49m/d, LAD 100p/m, D1 90ost, 70p, LCX 80ost/p, 123m, RCA 13m, 75d, RPDA 95ost, VG->RPDA 30p/m, VG->OM3 nl, LIMA->LAD nl, VG->D1 nl; d. 06/2019 [facility]: EF 52%, basal inf/mid ant/mid inf/apical ant/apical infarct w/ ischemia.   Carotid arterial disease (HCC)    a. s/p remote R CEA (Dr. Liliane Bade); b. 04/2021 Carotid U/S: 1-30% bilat ICA stenoses.   Diastolic dysfunction    a. 05/2021 Echo: EF 60-65%, no rwma, GrI DD, nl RV fxn.   Dyslipidemia    Erectile dysfunction    GERD (gastroesophageal reflux disease)    Heart attack (HCC) 1996   HYPERLIPIDEMIA-MIXED 06/09/2008   Hypertension    HYPERTENSION, UNSPECIFIED 06/09/2008   Orthostatic hypotension    Palpitations    a. 04/2021 Z[facility]: Predominantly RSR @ 77 (60-144), 5 SVT runs - fastest 144, longest 20 beats. Occas PVCs - 5%.   PVD (peripheral vascular disease) (HCC)  a. 02/2014 s/p DBA/stent to prox R popliteal; b. 06/2021 orbital atherectomy and DCBA to L popliteal (90%). Otw moderate, nonobs PAD.   Transient ischemic attack (TIA)    Type I diabetes mellitus (HCC) dx'd 08/20/1966   Viral gastroenteritis    04/19/2010 improved   Weakness     Past Surgical History:  Procedure Laterality Date   ABDOMINAL AORTOGRAM W/LOWER EXTREMITY N/A 06/07/2021    Procedure: ABDOMINAL AORTOGRAM W/LOWER EXTREMITY;  Surgeon: Iran Ouch, MD;  [facility]: [facility];  Service: Cardiovascular;  Laterality: N/A;   CARDIAC CATHETERIZATION  1996; ~ 2010   CAROTID ENDARTERECTOMY Right    CATARACT EXTRACTION, BILATERAL Bilateral    COLONOSCOPY WITH PROPOFOL N/A 06/07/2020   Procedure: COLONOSCOPY WITH PROPOFOL;  Surgeon: Regis Bill, MD;  Location: [facility] ENDOSCOPY;  Service: Endoscopy;  Laterality: N/A;   CORONARY ANGIOPLASTY     CORONARY ARTERY BYPASS GRAFT  1996   "CABG X4"   ESOPHAGOGASTRODUODENOSCOPY N/A 06/07/2020   Procedure: ESOPHAGOGASTRODUODENOSCOPY (EGD);  Surgeon: Regis Bill, MD;  Location: [facility] ENDOSCOPY;  Service: Endoscopy;  Laterality: N/A;   LEFT HEART CATH AND CORS/GRAFTS ANGIOGRAPHY N/A 01/11/2017   Procedure: LEFT HEART CATH AND CORS/GRAFTS ANGIOGRAPHY;  Surgeon: Dolores Patty, MD;  Location: [facility];  Service: Cardiovascular;  Laterality: N/A;   MOHS SURGERY     PERIPHERAL VASCULAR ATHERECTOMY  06/07/2021   Procedure: PERIPHERAL VASCULAR ATHERECTOMY;  Surgeon: Iran Ouch, MD;  Location: [facility];  Service: Cardiovascular;;   PERIPHERAL VASCULAR CATHETERIZATION N/A 12/15/2014   Procedure: Abdominal Aortogram;  Surgeon: Iran Ouch, MD;  Location: [facility];  Service: Cardiovascular;  Laterality: N/A;   REFRACTIVE SURGERY Right    "before cataract OR"     Current Outpatient Medications  Medication Sig Dispense Refill   albuterol (VENTOLIN HFA) 108 (90 Base) MCG/ACT inhaler Inhale 2 puffs into the lungs every 6 (six) hours as needed for wheezing or shortness of breath (Cough). 8 g 2   aspirin 81 MG tablet Take 81 mg by mouth at bedtime.     atorvastatin (LIPITOR) 80 MG tablet Take 1 tablet (80 mg total) by mouth daily.     Calcium Carb-Cholecalciferol 600-10 MG-MCG TABS Take 2 tablets by mouth daily.     Calcium-Magnesium-Zinc (CAL-MAG-ZINC PO) Take 1 tablet by mouth  daily.     carvedilol (COREG) 6.25 MG tablet Take 1 tablet (6.25 mg total) by mouth 2 (two) times daily. 180 tablet 3   ezetimibe (ZETIA) 10 MG tablet Take 10 mg by mouth daily.     fluticasone-salmeterol (WIXELA INHUB) 250-50 MCG/ACT AEPB Inhale 1 puff into the lungs in the morning and at bedtime. 60 each 2   glucagon 1 MG injection 1 mg once as needed (low blood glucose).     insulin aspart (NOVOLOG) 100 unit/mL injection Inject into the skin continuous. INSULIN PUMP     losartan (COZAAR) 50 MG tablet Take 50 mg by mouth daily.     nitroGLYCERIN (NITROSTAT) 0.4 MG SL tablet Place 0.4 mg under the tongue every 5 (five) minutes as needed for chest pain.      omeprazole (PRILOSEC) 20 MG capsule Take 20 mg by mouth every morning.      sertraline (ZOLOFT) 100 MG tablet Take 100 mg by mouth daily.     torsemide (DEMADEX) 20 MG tablet Take 20 mg by mouth every other day.     traZODone (DESYREL) 50 MG tablet Take 25 mg  by mouth at bedtime.     amoxicillin-clavulanate (AUGMENTIN) 875-125 MG tablet Take 1 tablet by mouth 2 (two) times daily. (Patient not taking: Reported on 08/08/2022) 20 tablet 0   No current facility-administered medications for this visit.    Allergies:   Simvastatin and Lisinopril    Social History:  The patient  reports that he quit smoking about 28 years ago. His smoking use included cigarettes. He has a 105.00 pack-year smoking history. He has never used smokeless tobacco. He reports that he does not currently use alcohol. He reports that he does not use drugs.   Family History:  The patient's family history includes Coronary artery disease in his father and mother; Diabetes in his father and mother; Stroke in an other family member.    ROS:  Please see the history of present illness.   Otherwise, review of systems are positive for none.   All other systems are reviewed and negative.    PHYSICAL EXAM: VS:  BP 116/80 (BP Location: Left Arm, Patient Position: Sitting, Cuff  Size: Normal)   Pulse 63   Ht 6' (1.829 m)   Wt 234 lb 9.6 oz (106.4 kg)   SpO2 96%   BMI 31.82 kg/m  , BMI Body mass index is 31.82 kg/m. GEN: Well nourished, well developed, in no acute distress  HEENT: normal  Neck: no JVD, or masses. Right carotid bruit Cardiac: RRR; no murmurs, rubs, or gallops, trace right leg swelling. Respiratory:  clear to auscultation bilaterally, normal work of breathing GI: soft, nontender, nondistended, + BS MS: no deformity or atrophy  Skin: warm and dry, no rash Neuro:  Strength and sensation are intact Psych: euthymic mood, full affect    EKG:  EKG is ordered today. EKG showed : Sinus rhythm with frequent Premature ventricular complexes Septal infarct (cited on or before 08-Jul-2010) When compared with ECG of 03-Jun-2019 14:27, No significant change was found    Recent Labs: 02/01/2022: Hemoglobin 12.2; Platelets 232    Lipid Panel    Component Value Date/Time   CHOL 116 07/08/2014 0830   TRIG 51 07/08/2014 0830   HDL 43 07/08/2014 0830   CHOLHDL 2.7 07/08/2014 0830   CHOLHDL 2.7 07/19/2010 0839   VLDL 10 07/19/2010 0839   LDLCALC 63 07/08/2014 0830      Wt Readings from Last 3 Encounters:  08/08/22 234 lb 9.6 oz (106.4 kg)  03/29/22 237 lb 2 oz (107.6 kg)  03/19/22 234 lb 12.8 oz (106.5 kg)        ASSESSMENT AND PLAN:  1.  Peripheral arterial disease: Status post angioplasty and stent placement to the right popliteal artery.  Status post orbital atherectomy and drug-coated balloon angioplasty of the left popliteal artery.   No plans to resume small dose Xarelto at this point due to concerns about cerebral microhemorrhages. Most recent duplex in November showed moderate stenosis in the right proximal SFA and patent left popliteal artery.  Repeat Doppler studies in November 2024.   2. Coronary artery disease involving native coronary arteries without angina: He reports improvement in exertional dyspnea.   Most recent Lexiscan  Myoview in 2021 was low risk.    3. Hyperlipidemia: Continue treatment with high dose atorvastatin with a target LDL of less than 70.  Most available lipid profile from 2022 showed an LDL of 68.  4. Carotid disease status post right carotid endarterectomy: Most recent Doppler in April 2023 showed less than 40% stenosis bilaterally.  5.  Chronic venous insufficiency  affecting the right lower extremity: Continue support stockings.  Symptoms are stable.  6.  Essential hypertension: Blood pressures controlled on current medications.  7.  Right vertebral artery stenosis: It is possible that some of his vertigo and dizziness symptoms are related to this.  Recommend referral to a neuro interventionalists for evaluation.    Disposition: Follow-up in 6 month.   Signed,  Lorine Bears, MD  08/08/2022 2:12 PM    [facility]

## 2022-08-08 NOTE — Telephone Encounter (Signed)
The patient is due for a 6 month follow up. Appointment  made for today 7/3

## 2022-08-29 ENCOUNTER — Encounter: Payer: Self-pay | Admitting: Cardiovascular Disease

## 2022-10-09 ENCOUNTER — Ambulatory Visit: Payer: Medicare Other | Admitting: Cardiovascular Disease

## 2022-12-12 ENCOUNTER — Ambulatory Visit: Payer: Medicare Other | Attending: Cardiovascular Disease

## 2022-12-12 DIAGNOSIS — I739 Peripheral vascular disease, unspecified: Secondary | ICD-10-CM | POA: Insufficient documentation

## 2022-12-12 LAB — VAS US ABI WITH/WO TBI: Right ABI: 1.1

## 2022-12-13 ENCOUNTER — Other Ambulatory Visit: Payer: Self-pay | Admitting: *Deleted

## 2022-12-13 DIAGNOSIS — I739 Peripheral vascular disease, unspecified: Secondary | ICD-10-CM

## 2023-01-02 NOTE — Progress Notes (Signed)
Error

## 2023-02-01 ENCOUNTER — Ambulatory Visit: Payer: Medicare Other | Admitting: Pulmonary Disease

## 2023-02-04 ENCOUNTER — Ambulatory Visit (INDEPENDENT_AMBULATORY_CARE_PROVIDER_SITE_OTHER): Payer: No Typology Code available for payment source | Admitting: Neurology

## 2023-02-04 ENCOUNTER — Encounter: Payer: Self-pay | Admitting: Neurology

## 2023-02-04 VITALS — BP 137/58 | HR 66 | Ht 72.0 in | Wt 239.0 lb

## 2023-02-04 DIAGNOSIS — E1039 Type 1 diabetes mellitus with other diabetic ophthalmic complication: Secondary | ICD-10-CM | POA: Diagnosis not present

## 2023-02-04 DIAGNOSIS — M48061 Spinal stenosis, lumbar region without neurogenic claudication: Secondary | ICD-10-CM

## 2023-02-04 DIAGNOSIS — R0683 Snoring: Secondary | ICD-10-CM | POA: Insufficient documentation

## 2023-02-04 DIAGNOSIS — Z8669 Personal history of other diseases of the nervous system and sense organs: Secondary | ICD-10-CM | POA: Insufficient documentation

## 2023-02-04 NOTE — Progress Notes (Signed)
SLEEP MEDICINE CLINIC    Provider:  Melvyn Novas, MD  Primary Care Physician:  Lauro Regulus, MD [address] [facility] [place] [address]     Referring Provider: Idelle Crouch, Md [address] [place],  [place] [address]          Chief Complaint according to patient   Patient presents with:     [facility] (Initial Visit)           HISTORY OF PRESENT ILLNESS:  Derek Hogan is a 77 y.o. male patient who is seen upon [facility] referral on 02/04/2023.  Chief concern according to patient :     I have the pleasure of seeing Derek Hogan 02/04/23 a right-handed Veteran of the [place] [employer] with a possible sleep disorder. He has DM type one, 1969 at age 41, and Sciatica. Has GERD on Omeprazole, He lost weight since the last HST through [place], when he got lost in follow -up. The patient had the first sleep study ( HST in 2022) with a result of mild OSA ( Apnea Hypopnea index).    Sleep relevant medical history: NO Tonsillectomy, no cervical spine surgery, no deviated septum repair? No UPPP? Severe sinal stenosis. CAD with bypass surgery. Diastolic dysfunction, PVC,  PAD- carotid artery 30%. Retinal diabetic disease, cataract, laser surgery.     Family medical /sleep history: no other family member on CPAP with OSA, insomnia, sleep walkers.    Social history:  Patient is retired from the [employer] and lives in a household with spouse. Family status is married , with a 62 year-old son. 2 dogs.  The patient used to work in shifts( night/ rotating,) Tobacco use; former smoker , quit 1996.  ETOH use ; not in 42 years.  Quit 1982  Caffeine intake in form of Coffee(  2-3 cups a day) , Soda( /) Tea ( /) or energy drinks Exercise in form of .  Golf, can't walk well.        Sleep habits are as follows: The patient's dinner time is between 5-6 PM. The patient goes to bed at 10.30 PM and no TV continues to sleep for 6-7  hours, he seldom wakes for 0-1 bathroom breaks, the first time at 2-3 AM.  The bedroom is cool, quiet and dark.  The preferred sleep position is laterally- left, with the support of 1 pillows.  Dreams are reportedly rare..   The patient wakes up by wife - 6.30 - she leaves to work.7 AM is the usual rise time. The dogs will wake him.  He reports not feeling refreshed or restored in AM, with symptoms such as dry mouth,, and residual fatigue. Naps are taken frequently, 5 out 7 days ,  lasting from 20 to 30 minutes and are more refreshing .    Review of Systems: Out of a complete 14 system review, the patient complains of only the following symptoms, and all other reviewed systems are negative.:  Fatigue, sleepiness , snoring,    How likely are you to doze in the following situations: 0 = not likely, 1 = slight chance, 2 = moderate chance, 3 = high chance   Sitting and Reading? Watching Television? Sitting inactive in a public place (theater or meeting)? As a passenger in a car for an hour without a break? Lying down in the afternoon when circumstances permit? Sitting and talking to someone? Sitting  quietly after lunch without alcohol? In a car, while stopped for a few minutes in traffic?   Total = 8/ 24 points   FSS endorsed at 12/ 63 points.   Social History   Socioeconomic History   Marital status: Married    Spouse name: Not on file   Number of children: Not on file   Years of education: Not on file   Highest education level: Not on file  Occupational History   Occupation: retired    Associate Professor: RETIRED  Tobacco Use   Smoking status: Former    Current packs/day: 0.00    Average packs/day: 3.0 packs/day for 35.0 years (105.0 ttl pk-yrs)    Types: Cigarettes    Start date: 02/06/1959    Quit date: 02/05/1994    Years since quitting: 29.0   Smokeless tobacco: Never  Vaping Use   Vaping status: Never Used  Substance and Sexual Activity   Alcohol use: Not Currently   Drug use:  No   Sexual activity: Yes  Other Topics Concern   Not on file  Social History Narrative   SOCIAL HISTORY:  The patient is married with children.  He is a nonsmoker.  He has a remote history of smoking, he quit in 1996.  He has a remote history of alcoholism.  He quit on Halloween in 1982.  He denies any illicit drug usage.                FAMILY HISTORY:  Positive for coronary artery disease and diabetic disease in both parents.    Social Drivers of Corporate investment banker Strain: Not on file  Food Insecurity: Not on file  Transportation Needs: Not on file  Physical Activity: Not on file  Stress: Not on file  Social Connections: Not on file    Family History  Problem Relation Age of Onset   Coronary artery disease Mother    Diabetes Mother    Stroke Other    Coronary artery disease Father    Diabetes Father     Past Medical History:  Diagnosis Date   CAD, ARTERY BYPASS GRAFT    a. 1996 s/p CABG x 4 (LIMA->LAD, VG->D1, VG->OM3, VG->RPDA; b. 12/2016 MV [facility]): EF 47%, anteroapical and inf infarcts w/ anteroapical peri-infarct ischemia; c. 01/2017: Cath: LM 25m/d, LAD 100p/m, D1 90ost, 70p, LCX 80ost/p, 149m, RCA 40m, 75d, RPDA 95ost, VG->RPDA 30p/m, VG->OM3 nl, LIMA->LAD nl, VG->D1 nl; d. 06/2019 MV: EF 52%, basal inf/mid ant/mid inf/apical ant/apical infarct w/ ischemia.   Carotid arterial disease (HCC)    a. s/p remote R CEA (Dr. Liliane Bade); b. 04/2021 Carotid U/S: 1-30% bilat ICA stenoses.   Diastolic dysfunction    a. 05/2021 Echo: EF 60-65%, no rwma, GrI DD, nl RV fxn.   Dyslipidemia    Erectile dysfunction    GERD (gastroesophageal reflux disease)    Heart attack (HCC) 1996   HYPERLIPIDEMIA-MIXED 06/09/2008   Hypertension    HYPERTENSION, UNSPECIFIED 06/09/2008   Orthostatic hypotension    Palpitations    a. 04/2021 Zio: Predominantly RSR @ 77 (60-144), 5 SVT runs - fastest 144, longest 20 beats. Occas PVCs - 5%.   PVD (peripheral vascular disease) (HCC)    a.  02/2014 s/p DBA/stent to prox R popliteal; b. 06/2021 orbital atherectomy and DCBA to L popliteal (90%). Otw moderate, nonobs PAD.   Transient ischemic attack (TIA)    Type I diabetes mellitus (HCC) dx'd 08/20/1966   Viral gastroenteritis    04/19/2010  improved   Weakness     Past Surgical History:  Procedure Laterality Date   ABDOMINAL AORTOGRAM W/LOWER EXTREMITY N/A 06/07/2021   Procedure: ABDOMINAL AORTOGRAM W/LOWER EXTREMITY;  Surgeon: Iran Ouch, MD;  [facility]: [facility] INVASIVE CV LAB;  Service: Cardiovascular;  Laterality: N/A;   CARDIAC CATHETERIZATION  1996; ~ 2010   CAROTID ENDARTERECTOMY Right    CATARACT EXTRACTION, BILATERAL Bilateral    COLONOSCOPY WITH PROPOFOL N/A 06/07/2020   Procedure: COLONOSCOPY WITH PROPOFOL;  Surgeon: Regis Bill, MD;  Location: [facility] ENDOSCOPY;  Service: Endoscopy;  Laterality: N/A;   CORONARY ANGIOPLASTY     CORONARY ARTERY BYPASS GRAFT  1996   "CABG X4"   ESOPHAGOGASTRODUODENOSCOPY N/A 06/07/2020   Procedure: ESOPHAGOGASTRODUODENOSCOPY (EGD);  Surgeon: Regis Bill, MD;  Location: [facility] ENDOSCOPY;  Service: Endoscopy;  Laterality: N/A;   LEFT HEART CATH AND CORS/GRAFTS ANGIOGRAPHY N/A 01/11/2017   Procedure: LEFT HEART CATH AND CORS/GRAFTS ANGIOGRAPHY;  Surgeon: Dolores Patty, MD;  Location: [facility]VAS[facility] CV LAB;  Service: Cardiovascular;  Laterality: N/A;   MOHS SURGERY     PERIPHERAL VASCULAR ATHERECTOMY  06/07/2021   Procedure: PERIPHERAL VASCULAR ATHERECTOMY;  Surgeon: Iran Ouch, MD;  Location: [facility];  Service: Cardiovascular;;   PERIPHERAL VASCULAR CATHETERIZATION N/A 12/15/2014   Procedure: Abdominal Aortogram;  Surgeon: Iran Ouch, MD;  Location: [facility]AS[facility] CV [facility];  Service: Cardiovascular;  Laterality: N/A;   REFRACTIVE SURGERY Right    "before cataract OR"     Current Outpatient Medications on File Prior to Visit  Medication Sig Dispense Refill   amLODipine (NORVASC) 10 MG tablet Take 10 mg by mouth  daily.     albuterol (VENTOLIN HFA) 108 (90 Base) MCG/ACT inhaler Inhale 2 puffs into the lungs every 6 (six) hours as needed for wheezing or shortness of breath (Cough). 8 g 2   aspirin 81 MG tablet Take 81 mg by mouth at bedtime.     atorvastatin (LIPITOR) 80 MG tablet Take 1 tablet (80 mg total) by mouth daily.     Calcium Carb-Cholecalciferol 600-10 MG-MCG TABS Take 2 tablets by mouth daily.     Calcium-Magnesium-Zinc (CAL-MAG-ZINC PO) Take 1 tablet by mouth daily.     carvedilol (COREG) 6.25 MG tablet Take 1 tablet (6.25 mg total) by mouth 2 (two) times daily. 180 tablet 3   ezetimibe (ZETIA) 10 MG tablet Take 10 mg by mouth daily.     fluticasone-salmeterol (WIXELA INHUB) 250-50 MCG/ACT AEPB Inhale 1 puff into the lungs in the morning and at bedtime. 60 each 2   glucagon 1 MG injection 1 mg once as needed (low blood glucose).     insulin aspart (NOVOLOG) 100 unit/mL injection Inject into the skin continuous. INSULIN PUMP     losartan (COZAAR) 50 MG tablet Take 50 mg by mouth daily. (Patient not taking: Reported on 02/04/2023)     nitroGLYCERIN (NITROSTAT) 0.4 MG SL tablet Place 0.4 mg under the tongue every 5 (five) minutes as needed for chest pain.      omeprazole (PRILOSEC) 20 MG capsule Take 20 mg by mouth every morning.      sertraline (ZOLOFT) 100 MG tablet Take 100 mg by mouth daily.     torsemide (DEMADEX) 20 MG tablet Take 20 mg by mouth every other day.     traZODone (DESYREL) 50 MG tablet Take 25 mg by mouth at bedtime.     No current facility-administered medications on file prior to visit.    Allergies  Allergen Reactions   Simvastatin Swelling   Lisinopril Other (See Comments)    Unknown     DIAGNOSTIC DATA (LABS, IMAGING, TESTING) - I reviewed patient records, labs, notes, testing and imaging myself where available.  Lab Results  Component Value Date   WBC 10.6 (H) 02/01/2022   HGB 12.2 (L) 02/01/2022   HCT 37.5 (L) 02/01/2022   MCV 92.8 02/01/2022   PLT 232  02/01/2022      Component Value Date/Time   NA 136 06/29/2021 1507   NA 140 05/09/2021 0847   K 4.4 06/29/2021 1507   CL 105 06/29/2021 1507   CO2 24 06/29/2021 1507   GLUCOSE 130 (H) 06/29/2021 1507   BUN 19 06/29/2021 1507   BUN 16 05/09/2021 0847   CREATININE 1.24 06/29/2021 1507   CREATININE 0.86 07/19/2010 0839   CALCIUM 9.1 06/29/2021 1507   PROT 6.3 (L) 03/16/2015 2214   PROT 6.3 07/08/2014 0830   ALBUMIN 3.7 03/16/2015 2214   ALBUMIN 4.1 07/08/2014 0830   AST 25 03/16/2015 2214   ALT 23 03/16/2015 2214   ALKPHOS 62 03/16/2015 2214   BILITOT 0.7 03/16/2015 2214   BILITOT 0.6 07/08/2014 0830   GFRNONAA >60 06/29/2021 1507   GFRAA >60 01/10/2017 1341   Lab Results  Component Value Date   CHOL 116 07/08/2014   HDL 43 07/08/2014   LDLCALC 63 07/08/2014   TRIG 51 07/08/2014   CHOLHDL 2.7 07/08/2014   Lab Results  Component Value Date   HGBA1C 7.8 (H) 10/09/2012   Lab Results  Component Value Date   VITAMINB12 948 (H) 06/29/2021   Lab Results  Component Value Date   TSH 0.460 04/18/2010    PHYSICAL EXAM:  Today's Vitals   02/04/23 0859  BP: (!) 137/58  Pulse: 66  Weight: 239 lb (108.4 kg)  Height: 6' (1.829 m)   Body mass index is 32.41 kg/m.   Wt Readings from Last 3 Encounters:  02/04/23 239 lb (108.4 kg)  08/08/22 234 lb 9.6 oz (106.4 kg)  03/29/22 237 lb 2 oz (107.6 kg)     Ht Readings from Last 3 Encounters:  02/04/23 6' (1.829 m)  08/08/22 6' (1.829 m)  03/29/22 6' (1.829 m)      General: The patient is awake, alert and appears not in acute distress. The patient is well groomed. Head: Normocephalic, atraumatic. Neck is supple.  Mallampati 2,  neck circumference:19 inches .  Nasal airflow  patent.   Retrognathia is  not seen.  Dental status: dentures.  Cardiovascular:  Regular rate and cardiac rhythm by pulse,  without distended neck veins. Respiratory: Lungs are clear to auscultation.  Skin:  Without evidence of ankle edema,  there are petechia.  Trunk: The patient's posture is erect.   NEUROLOGIC EXAM: The patient is awake and alert, oriented to place and time.   Memory subjective described as intact.  Attention span & concentration ability appears normal.  Speech is fluent,  without  dysarthria, with dysphonia.  Mood and affect are appropriate.   Cranial nerves: no loss of smell or taste reported  Pupils are equal and briskly reactive to light. Funduscopic exam deferred.status post cataract. .  Extraocular movements in vertical and horizontal planes were intact and without nystagmus. No Diplopia. Visual fields by finger perimetry are intact. Hearing was intact to soft voice and finger rubbing.    Facial sensation intact to fine touch.  Facial motor strength is symmetric and tongue and uvula move midline.  Neck ROM : rotation, tilt and flexion extension were normal for age and shoulder shrug was symmetrical.    Motor exam:  Symmetric bulk, tone and ROM.   Normal tone without cog -wheeling, symmetric grip strength .   Sensory:  Vibration was absent in ankles toes and knees !! Proprioception tested in the upper extremities was normal.   Coordination: Rapid alternating movements in the fingers/hands were of normal speed.  The Finger-to-nose maneuver was intact without evidence of ataxia, dysmetria or tremor.   Gait and station: Patient could rise unassisted from a seated position, walked without assistive device.  Stance is of  wider base and the patient turned with 4 steps.  Toe and heel walk were deferred.  Deep tendon reflexes: in the upper and lower extremities are symmetric and intact.  Babinski response was deferred.     ASSESSMENT AND PLAN 77 year- old male  here with:    1) Likely untreated sleep apnea which was reportedly mild, and he has been snoring loudly.   2) I will invite him for an in lab sleep study , he lives in [place]. I would prefer the in -lab study given his cardiac rhythm  symptoms, his spinal stenosis.  Trazodone to be brought with him.   3) SPLIT at 15/ h    I plan to follow up either personally or through our NP within 3-5 months.   I would like to thank Lauro Regulus, MD and Idelle Crouch, Md [address] [place],  [place] [address] for allowing me to meet with and to take care of this pleasant patient.   CC: [facility] with [facility].  After spending a total time of  35  minutes face to face and additional time for physical and neurologic examination, review of laboratory studies,  personal review of imaging studies, reports and results of other testing and review of referral information / records as far as provided in visit,   Electronically signed by: Melvyn Novas, MD 02/04/2023 9:02 AM  [facility] Board certified by The [employer] of Sleep Medicine and Diplomate of the [employer] of Sleep Medicine. Board certified In Neurology through the ABPN, Fellow of the [employer] of Neurology.

## 2023-02-04 NOTE — Patient Instructions (Signed)
Healthy Living: Sleep In this video, you will learn why sleep is an important part of a healthy lifestyle. To view the content, go to this web address: https://pe.elsevier.com/[email or web address]  This video will expire on: 07/29/2024. If you need access to this video following this date, please reach out to the healthcare provider who assigned it to you. This information is not intended to replace advice given to you by your health care provider. Make sure you discuss any questions you have with your health care provider. Elsevier Patient Education  2024 [employer].

## 2023-02-12 ENCOUNTER — Encounter: Payer: Self-pay | Admitting: Neurology

## 2023-02-13 NOTE — Telephone Encounter (Signed)
 Patient called and r/s his SS appt.  Split- [place] [facility]: [id number] (exp. 12/05/22 to 06/03/23)   Patient is scheduled at [facility] for 02/26/23 at 8 pm.  Mailed packet to the patient and sent mychart.

## 2023-02-26 ENCOUNTER — Ambulatory Visit (INDEPENDENT_AMBULATORY_CARE_PROVIDER_SITE_OTHER): Payer: No Typology Code available for payment source | Admitting: Neurology

## 2023-02-26 DIAGNOSIS — Z8669 Personal history of other diseases of the nervous system and sense organs: Secondary | ICD-10-CM

## 2023-02-26 DIAGNOSIS — R0683 Snoring: Secondary | ICD-10-CM | POA: Diagnosis not present

## 2023-03-08 ENCOUNTER — Encounter: Payer: Self-pay | Admitting: Neurology

## 2023-03-08 NOTE — Procedures (Signed)
[facility] POLYSOMNOGRAPHY  INTERPRETATION REPORT   STUDY DATE:  02/26/2023     PATIENT NAME:  Derek Hogan         DATE OF BIRTH:  09-04-1945  PATIENT ID:  [id number]    TYPE OF STUDY:  PSG. Derek Hogan , RPSGT  READING PHYSICIAN: Derek Novas, MD REFERRED BY: [facility] Referred by Derek Crouch, MD   SCORING TECHNICIAN: Derek Hogan, RPSGT   HISTORY:  This 78 year-old Male Derek Hogan has reported a history of sleep apnea , dx through the [place] by HST . Here to clarify if Apnea is present, SPLIT study to be initiated at AHI 15 or higher, following CMS criteria.   1) Likely untreated sleep apnea which was reportedly mild, but  he has been snoring loudly.   I will invite him for an in lab sleep study, he lives in [place]. I would prefer the in -lab study given his cardiac rhythm symptoms, his spinal stenosis.  Trazodone to be brought to the lab as a sleep aid .   ADDITIONAL INFORMATION:  The Epworth Sleepiness Scale endorsed at 8/ 24 points   FSS endorsed at 12/ 63 points.   Height: 72 in Weight: 239 lb (BMI 32) Neck Size: 19 in  MEDICATIONS: Norvasc, Ventolin HFA, Aspirin, Lipitor, Calcium Carb-Cholecalciferol, Calcium-Magnesium-Zinc, Coreg, Zetia, Wixela Inhub, [place], Novolog, Cozaar, Nitrostat, Prilosec, Zoloft, Demadex, Desyrel TECHNICAL DESCRIPTION: A registered sleep technologist ( RPSGT)  was in attendance for the duration of the recording.  Data collection, scoring, video monitoring, and reporting were performed in compliance with the AASM Manual for the Scoring of Sleep and Associated Events; (Hypopnea is scored based on the criteria listed in Section VIII D. 1b in the AASM Manual V2.6 using a 4% oxygen desaturation rule or Hypopnea is scored based on the criteria listed in Section VIII D. 1a in the AASM Manual V2.6 using 3% oxygen desaturation and /or arousal rule).   SLEEP CONTINUITY AND SLEEP ARCHITECTURE:  Lights-out was at 21:58: and lights-on  at  04:50:, with  6.9 hours of recording time.  Total sleep time ( TST) was 354.5 minutes with a normal sleep efficiency at 86.0%. There were 16.9% of REM sleep.  BODY POSITION:  TST was divided between the following sleep positions: : supine 61 minutes (17%), non-supine 294 minutes (83%); divided into : right sided sleep 06 minutes (2%), left 287 minutes (81%), and prone 00 minutes (0%) with no supine REM sleep time. Sleep latency was 39.0 minutes.  REM sleep latency was increased at 241.0 minutes. Of the total sleep time, the percentage of stage N1 sleep was 5.2%, stage N2 sleep was 78%, stage N3 sleep was 0.0%, and REM sleep was 16.9%.  There were 4 Stage R periods observed on this study night, 15 awakenings (i.e. transitions to Stage W from any sleep stage), and 48 total stage transitions. Wake after sleep onset (WASO) time accounted for 18.5 minutes.   RESPIRATORY MONITORING: Based on CMS criteria (using a 4% oxygen desaturation rule for scoring hypopneas), there were 10 apneas (9 obstructive; 0 central; 1 mixed), and 61 hypopneas scored.  The Apnea index was 1.7/h . Hypopnea index was 10.3/h.  The apnea-hypopnea index (AHI )  was 12.0/h overall (45.2/h in supine, 11/h in  non-supine; 11.0/h in  REM, there was no supine REM). OXIMETRY: Oxyhemoglobin Saturation Nadir during sleep was at 78% from a mean of 90%.  Of the Total sleep time (TST) hypoxemia (<89%) was present for  118.8 minutes, or 33.5% of total sleep time.  LIMB MOVEMENTS: There were 452 periodic limb movements of sleep (76.5/h), of which only 5 (0.8/hr) were associated with an arousal. AROUSAL: There were 31 arousals in total, for an arousal index of 5 arousals/hour.  Of these, 9 were identified as respiratory-related arousals (2 /h), 5 were PLM-related arousals (1 /h), and 23 were non-specific arousals (4 /h). There were 0 occurrences of Cheyne Stokes breathing.   EEG:  PSG EEG was of normal amplitude and frequency, with symmetric  manifestation of sleep stages. EKG: The electrocardiogram documented frequent PVCs but not atrial fibrillation.  The average heart rate during sleep was 69 bpm.  The heart rate during sleep varied between a minimum of  63 bpm and  a maximum of  84 bpm. AUDIO and VIDEO:  heavy breathing with moderate snoring followed the patient's turn to supine sleep at 3.47 AM .  At the 2 hour SPLIT protocol cut-off, there was an AHI of 3/h recorded and therefor CPAP was not initiated. It was under REM sleep that the AHI rose significantly, and no REM sleep had occurred before 2.40 AM. This patient slept in lateral sleep position before 3.50 AM and change to supine sleep at that time increased respiratory events and sustained hypoxemia.    IMPRESSION: 1) Sleep disordered breathing was present- but only with REM sleep onset after 2.40 AM , and further exacerbated by supine sleep for the last hour of recording from 3.50 through 5 AM . bringing the AHI from 3/h during the first 2/3 of the total sleep study to a final exacerbation during the last hour to 48/h in supine sleep.     2) Periodic limb movements were seen but these did not translate into REM sleep ( not REM BD ).  3) Sleep efficiency was high after sleep was initiated and sleep was not fragmented.   RECOMMENDATIONS: 1) AVOIDING SUPINE SLEEP !   Sleep on the back has been identified as the main trigger for apneas and hypopneas.  Weight loss and core strength building will also reduce the AHI further.  The significant sleep hypoxia was also supine dependent and would be treated with CPAP/02 if the patient cannot avoid supine sleep.     RECOMMENDATIONS:Both, CPAP and Oxygen therapy are considered optional if supine sleep (on the back) can be avoided.      if the patient cannot avoid supine sleep ( will suggest tennis ball method) I would offer a CPAP titration return to the laboratory- If hypoxia  would persist under CPAP therapy, we may need oxygen  titration.    As [facility], Derek Hogan would get follow up through the [place] system.  Derek Novas, MD             General Information  Name: Derek Hogan, Derek Hogan BMI: 60.45 Physician: Derek Novas, MD  ID: [id number] Height: 72.0 in Technician: Derek Hogan, RPSGT  Sex: Male Weight: 239.0 lb Record: [id number]  Age: 56 [23-Apr-1945] Date: 02/26/2023    Medical & Medication History    Derek Hogan is a 78 y.o. male patient who is seen upon referral by the [place] in [place] on 02/04/2023.   I have the pleasure of seeing Derek Hogan on 02/04/23. He is a right-handed [place] of the [employer] with a possible sleep disorder. He has DM type one, dx in 53 at age 41, and Sciatica, GERD on Omeprazole, CAD and Bypass ," diastolic dysfunction",  PVC, PAD ,  diabetic retinal disease-  He lost weight since the last HST through the  [place], afterwards got lost to follow -up. The patient reported that this sleep study ( HST in 2022) had a result of mild OSA ( unknown  Apnea Hypopnea index).    Social history:  Patient is retired from the [employer] and lives in a household with spouse. Family status is married, with an adult 10 year-old son. 2 dogs.  The patient used to work in shifts( night/ rotating,) Tobacco use; former smoker, quit 1996.  ETOH use; not in 42 years. Quit 1982  Caffeine intake in form of Coffee( 2-3 cups a day) , but denies Soda( /) Tea ( /) or energy drinks Norvasc, Ventolin HFA, Aspirin, Lipitor, Calcium Carb-Cholecalciferol, Calcium-Magnesium-Zinc, Coreg, Zetia, Wixela Inhub, [place], Novolog, Cozaar, Nitrostat, Prilosec, Zoloft, Demadex, Desyrel         Comments   Patient arrived for a diagnostic polysomnogram. Procedure explained and all questions answered. paste setup without complications. Patient slept supine, left, and right. Mild to moderate snoring was noted. Respiratory events observed, worse while supine. After two hours of total  sleep time, AHI = 3. Cardiac arrhythmias noted. Occasional multifocal PVC's observed. No significant PLMS observed. No restroom visits.    Lights out: 09:58:33 PM Lights on: 04:50:47 AM   Time Total Supine Side Prone Upright  Recording (TRT) 6h 52.89m 1h 6.14m 5h 46.5m 0h 0.20m 0h 0.21m  Sleep (TST) 5h 54.41m 1h 1.25m 4h 53.26m 0h 0.88m 0h 0.27m   Latency N1 N2 N3 REM Onset Per. Slp. Eff.  Actual 0h 39.80m 0h 43.15m 0h 0.32m 4h 1.17m 0h 39.76m 0h 56.53m 86.04%   Stg Dur Wake N1 N2 N3 REM  Total 18.5 18.5 276.0 0.0 60.0  Supine 3.0 3.5 57.5 0.0 0.0  Side 15.5 15.0 218.5 0.0 60.0  Prone 0.0 0.0 0.0 0.0 0.0  Upright 0.0 0.0 0.0 0.0 0.0   Stg % Wake N1 N2 N3 REM  Total 5.0 5.2 77.9 0.0 16.9  Supine 0.8 1.0 16.2 0.0 0.0  Side 4.2 4.2 61.6 0.0 16.9  Prone 0.0 0.0 0.0 0.0 0.0  Upright 0.0 0.0 0.0 0.0 0.0     Apnea Summary Sub Supine Side Prone Upright  Total 10 Total 10 7 3  0 0    REM 0 0 0 0 0    NREM 10 7 3  0 0  Obs 9 REM 0 0 0 0 0    NREM 9 6 3  0 0  Mix 1 REM 0 0 0 0 0    NREM 1 1 0 0 0  Cen 0 REM 0 0 0 0 0    NREM 0 0 0 0 0   Rera Summary Sub Supine Side Prone Upright  Total 0 Total 0 0 0 0 0    REM 0 0 0 0 0    NREM 0 0 0 0 0   Hypopnea Summary Sub Supine Side Prone Upright  Total 69 Total 69 42 27 0 0    REM 13 0 13 0 0    NREM 56 42 14 0 0   4% Hypopnea Summary Sub Supine Side Prone Upright  Total (4%) 61 Total 61 39 22 0 0    REM 11 0 11 0 0    NREM 50 39 11 0 0     AHI Total Obs Mix Cen  13.37 Apnea 1.69 1.52 0.17 0.00   Hypopnea 11.68 -- -- --  12.02 Hypopnea (4%) 10.32 -- -- --    Total Supine Side Prone Upright  Position AHI 13.37 48.20 6.13 0.00 0.00  REM AHI 13.00   NREM AHI 13.45   Position RDI 13.37 48.20 6.13 0.00 0.00  REM RDI 13.00   NREM RDI 13.45    4% Hypopnea Total Supine Side Prone Upright  Position AHI (4%) 12.02 45.25 5.11 0.00 0.00  REM AHI (4%) 11.00   NREM AHI (4%) 12.22   Position RDI (4%) 12.02 45.25 5.11 0.00 0.00  REM RDI (4%) 11.00    NREM RDI (4%) 12.22    Desaturation Information Threshold: 2% <100% <90% <80% <70% <60% <50% <40%  Supine 70.0 70.0 1.0 0.0 0.0 0.0 0.0  Side 154.0 110.0 4.0 0.0 0.0 0.0 0.0  Prone 0.0 0.0 0.0 0.0 0.0 0.0 0.0  Upright 0.0 0.0 0.0 0.0 0.0 0.0 0.0  Total 224.0 180.0 5.0 0.0 0.0 0.0 0.0  Index 36.0 29.0 0.8 0.0 0.0 0.0 0.0   Threshold: 3% <100% <90% <80% <70% <60% <50% <40%  Supine 62.0 62.0 1.0 0.0 0.0 0.0 0.0  Side 71.0 49.0 4.0 0.0 0.0 0.0 0.0  Prone 0.0 0.0 0.0 0.0 0.0 0.0 0.0  Upright 0.0 0.0 0.0 0.0 0.0 0.0 0.0  Total 133.0 111.0 5.0 0.0 0.0 0.0 0.0  Index 21.4 17.9 0.8 0.0 0.0 0.0 0.0   Threshold: 4% <100% <90% <80% <70% <60% <50% <40%  Supine 59.0 59.0 1.0 0.0 0.0 0.0 0.0  Side 44.0 36.0 4.0 0.0 0.0 0.0 0.0  Prone 0.0 0.0 0.0 0.0 0.0 0.0 0.0  Upright 0.0 0.0 0.0 0.0 0.0 0.0 0.0  Total 103.0 95.0 5.0 0.0 0.0 0.0 0.0  Index 16.6 15.3 0.8 0.0 0.0 0.0 0.0   Threshold: 4% <100% <90% <80% <70% <60% <50% <40%  Supine 59 59 1 0 0 0 0  Side 44 36 4 0 0 0 0  Prone 0 0 0 0 0 0 0  Upright 0 0 0 0 0 0 0  Total 103 95 5 0 0 0 0   Awakening/Arousal Information # of Awakenings 15  Wake after sleep onset 18.31m  Wake after persistent sleep 7.69m   Arousal Assoc. Arousals Index  Apneas 2 0.3  Hypopneas 7 1.2  Leg Movements 8 1.4  Snore 0 0.0  PTT Arousals 0 0.0  Spontaneous 23 3.9  Total 40 6.8  Leg Movement Information PLMS LMs Index  Total LMs during PLMS 452 76.5  LMs w/ Microarousals 5 0.8   LM LMs Index  w/ Microarousal 3 0.5  w/ Awakening 6 1.0  w/ Resp Event 0 0.0  Spontaneous 8 1.4  Total 11 1.9     Desaturation threshold setting: 4% Minimum desaturation setting: 10 seconds SaO2 nadir: 73% The longest event was a 43 sec obstructive Hypopnea with a minimum SaO2 of 79%. The lowest SaO2 was 78% associated with a 19 sec obstructive Apnea. EKG Rates EKG Avg Max Min  Awake 73 81 64  Asleep 69 84 63  EKG Events: N/A

## 2023-03-11 ENCOUNTER — Telehealth: Payer: Self-pay | Admitting: Neurology

## 2023-03-11 NOTE — Telephone Encounter (Signed)
-----   Message from Carlton Dohmeier sent at 03/08/2023  3:14 PM EST ----- There was no significant apnea before REM sleep onset and only after the [place] patient turned to his back did the AHI increase dramatically along with drop in oxygen saturation. Supine sleep was bringing the AHI from 3/h during the first 2/3 of the total sleep study to a final exacerbation during the last hour to 48/h in supine sleep.  PVC were seen , multifocal .   If the patient can avoid turning on his back, he needs no other intervention.  If not, he will be invited for an in lab titration in supine position, please ask patient about his preference.   [facility] Referred by Idelle Crouch, MD

## 2023-03-11 NOTE — Telephone Encounter (Signed)
Called the patient and reviewed the results in detail with him. Advised that overall if he can avoid sleeping on back this would be ok as when he was on his side the apnea was not bad. The main concern was the extended amount of time the patient oxygen level remained below 89%. Advised the patient I will send the information to [facility] to referring MD. He will follow up with them. Pt verbalized understanding.

## 2023-04-26 ENCOUNTER — Other Ambulatory Visit: Payer: Self-pay

## 2023-04-26 DIAGNOSIS — R3129 Other microscopic hematuria: Secondary | ICD-10-CM

## 2023-04-27 IMAGING — CT CT CHEST W/O CM
1 series · 15 of 34 positions shown, 19 images · non-contrast
Comparison: Chest radiograph, 10/24/2015.  Chest CT, 04/18/2010.

CLINICAL DATA: Former smoker.  Interstitial lung disease.



[Series 2: chest w/o 2mm st · axial · non-contrast · 0.89mm/px · z∈[-332,-40]mm · 15 of 172 slices shown, 19 images]
[im 13/172  mediastinal]
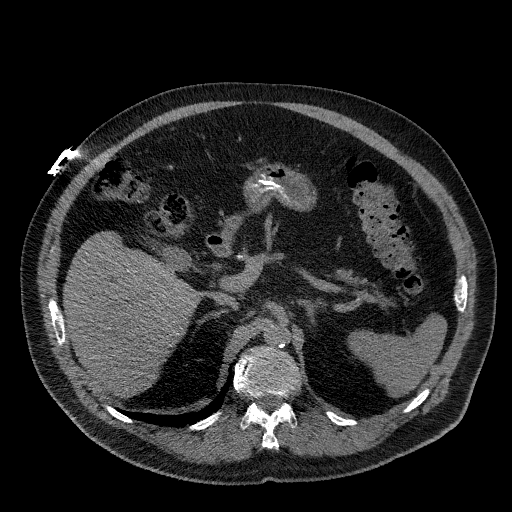
[im 13/172  lung]
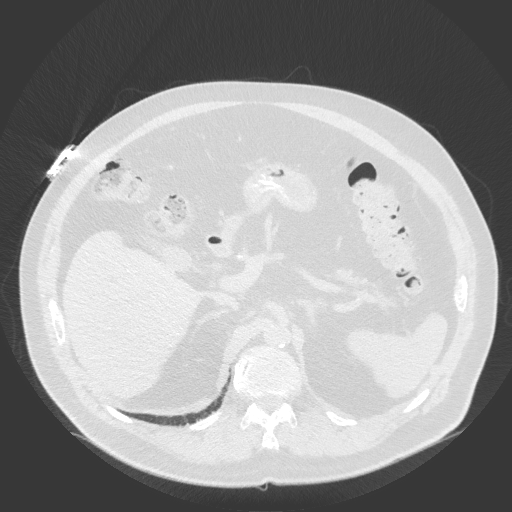
[im 26/172  lung]
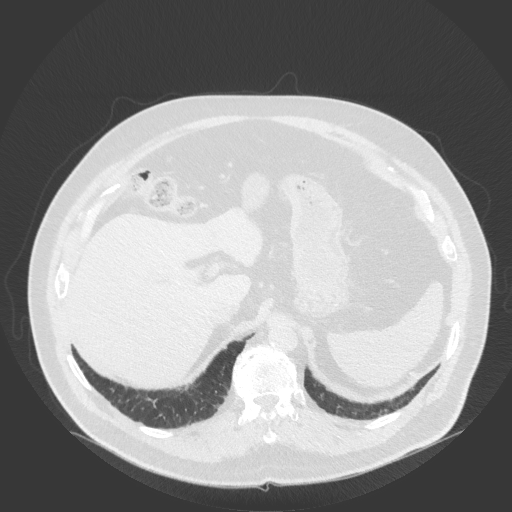
[im 35/172  lung]
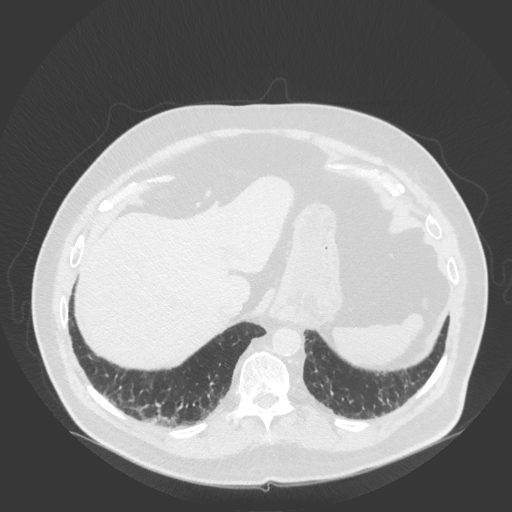
[im 45/172  lung]
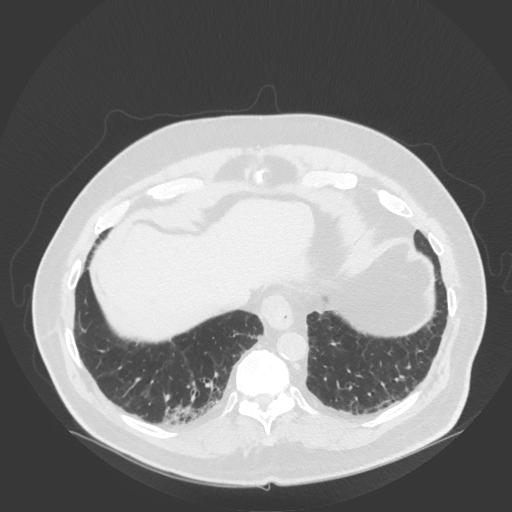
[im 58/172  mediastinal]
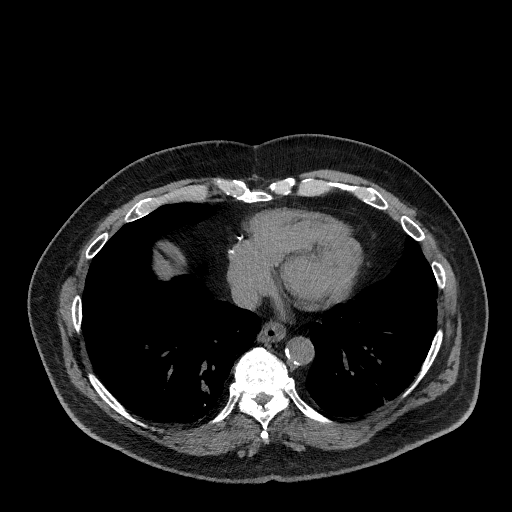
[im 58/172  lung]
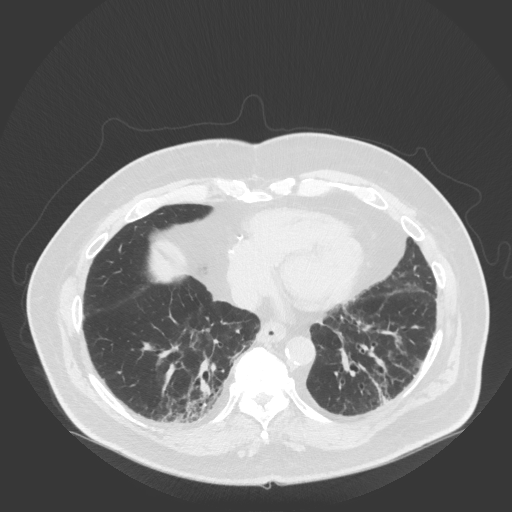
[im 69/172  lung]
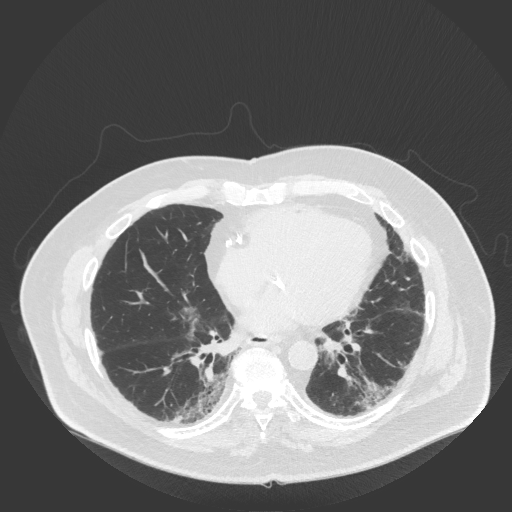
[im 77/172  lung]
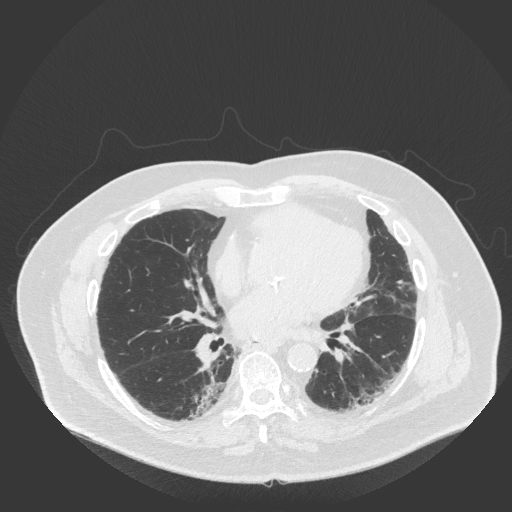
[im 89/172  lung]
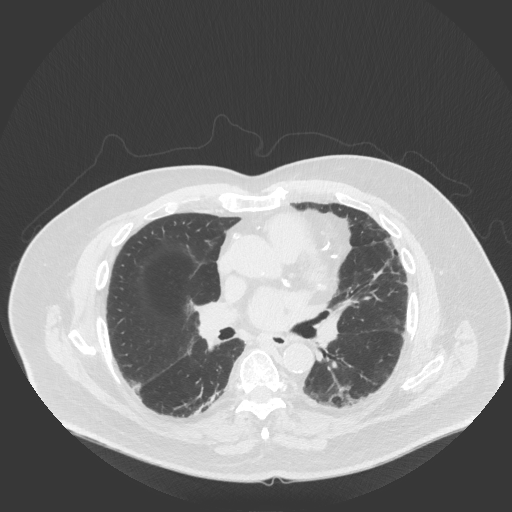
[im 96/172  mediastinal]
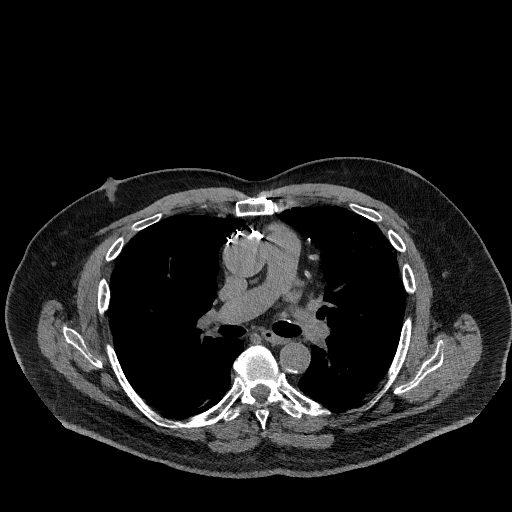
[im 96/172  lung]
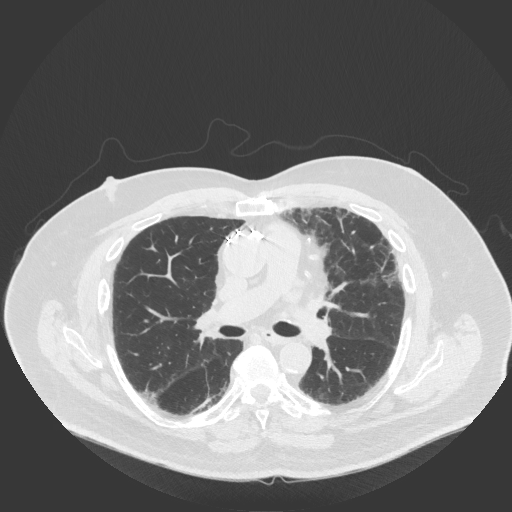
[im 103/172  lung]
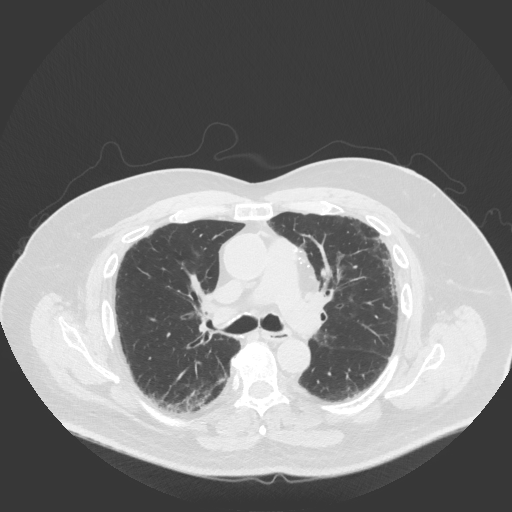
[im 115/172  lung]
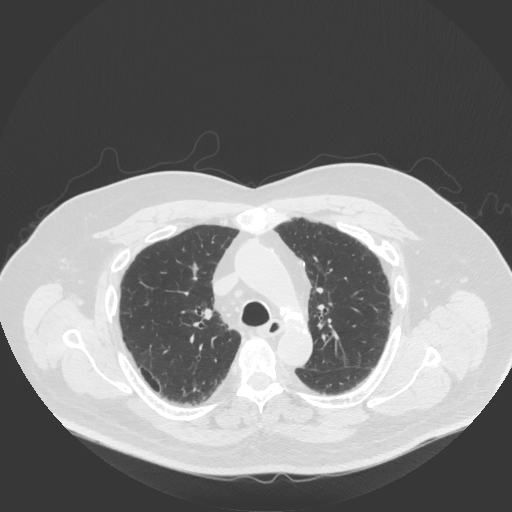
[im 127/172  lung]
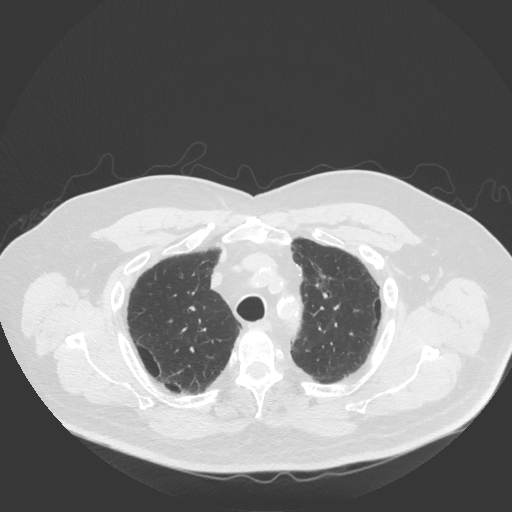
[im 137/172  mediastinal]
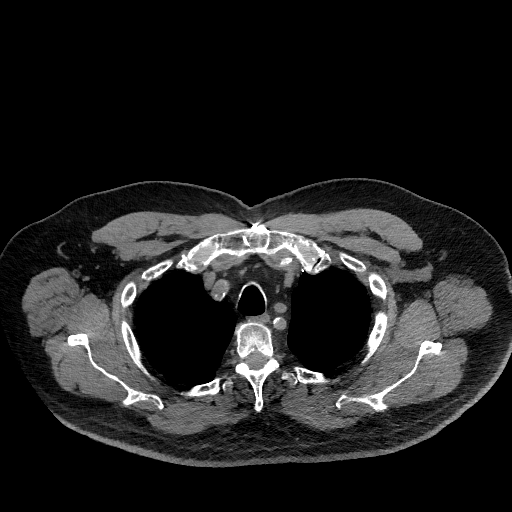
[im 137/172  lung]
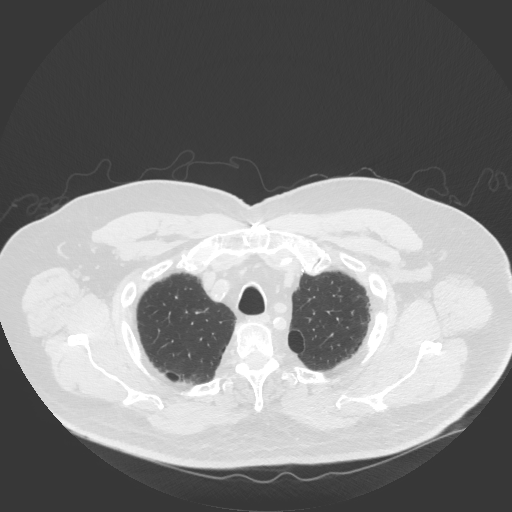
[im 146/172  lung]
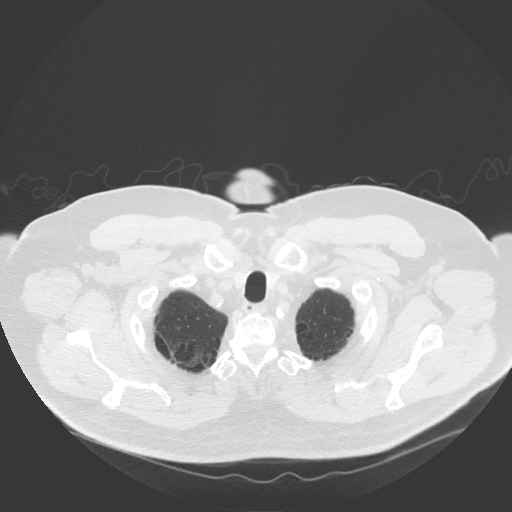
[im 159/172  lung]
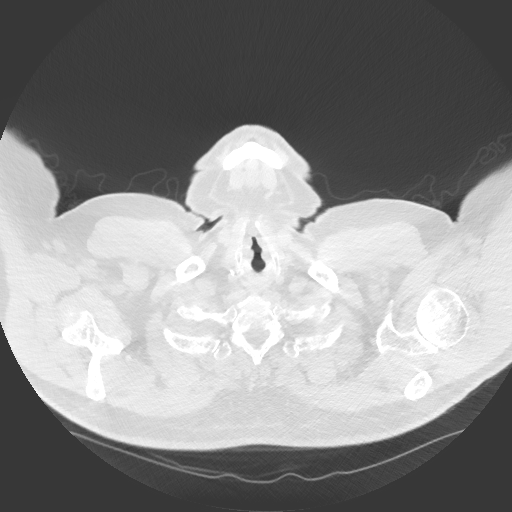

[15 of 34 positions shown; findings below may reference images not displayed]

FINDINGS: Cardiovascular: Heart is normal in size and configuration. Are
changes from previous CABG surgery. Dense coronary artery
calcifications. No pericardial effusion. Great vessels are normal in
caliber. Aortic atherosclerotic calcifications.

Mediastinum/Nodes: No neck base, mediastinal or hilar masses or
enlarged lymph nodes. Trachea and esophagus are unremarkable.

Lungs/Pleura: Heterogeneous areas of peripheral reticulation,
architectural distortion and associated bronchiectasis, with a lower
lobe predominance, associated with mild intervening ground-glass
opacities, consistent with interstitial lung disease, UIP suspected.

Mild upper lobe paraseptal emphysema.

No evidence of pneumonia or pulmonary edema. No lung mass or
suspicious nodule. No pleural effusion or pneumothorax.

Upper Abdomen: No acute findings. Subtle small low-attenuation liver
lesions, consistent with cysts.

Musculoskeletal: Mild wedge-shaped compression deformity of T6, new
since the prior CT, stable from the lateral chest radiograph from
10/24/2015. No acute fractures. No bone lesions. No chest wall mass.
IMPRESSION: 1. No acute findings.
2. Lung findings as detailed consistent with interstitial lung
disease, pattern consistent with UIP. Lung findings have mildly
progressed when compared to the 6286 CT.
3. Previous CABG surgery with dense coronary artery calcifications.
Aortic atherosclerosis. Mild paraseptal emphysema.

Aortic Atherosclerosis (BGA7G-8LS.S) and Emphysema (BGA7G-IYT.A).

## 2023-05-03 ENCOUNTER — Ambulatory Visit (INDEPENDENT_AMBULATORY_CARE_PROVIDER_SITE_OTHER): Admitting: Urology

## 2023-05-03 ENCOUNTER — Other Ambulatory Visit
Admission: RE | Admit: 2023-05-03 | Discharge: 2023-05-03 | Disposition: A | Discharge status: Home / Self Care | Attending: Urology | Admitting: Urology

## 2023-05-03 VITALS — BP 117/63 | HR 67

## 2023-05-03 DIAGNOSIS — Z125 Encounter for screening for malignant neoplasm of prostate: Secondary | ICD-10-CM | POA: Diagnosis not present

## 2023-05-03 DIAGNOSIS — Z87891 Personal history of nicotine dependence: Secondary | ICD-10-CM | POA: Diagnosis not present

## 2023-05-03 DIAGNOSIS — R3129 Other microscopic hematuria: Secondary | ICD-10-CM | POA: Diagnosis present

## 2023-05-03 LAB — URINALYSIS, COMPLETE (UACMP) WITH MICROSCOPIC
Bilirubin Urine: NEGATIVE
Glucose, UA: NEGATIVE mg/dL
Hgb urine dipstick: NEGATIVE
Ketones, ur: NEGATIVE mg/dL
Leukocytes,Ua: NEGATIVE
Nitrite: NEGATIVE
Protein, ur: NEGATIVE mg/dL
Specific Gravity, Urine: 1.015 (ref 1.005–1.030)
pH: 7 (ref 5.0–8.0)

## 2023-05-03 NOTE — Progress Notes (Signed)
 Marcelle Overlie Plume,acting as a scribe for Vanna Scotland, MD.,have documented all relevant documentation on the behalf of Vanna Scotland, MD,as directed by  Vanna Scotland, MD while in the presence of Vanna Scotland, MD.  05/03/23 2:01 PM   Derek Hogan [birth year] [id number]  Referring provider: Lauro Regulus, MD [address] [facility] [place] [address] [place],  [place] [address]  Chief Complaint  Patient presents with   Establish Care   Hematuria    HPI: 78 year-old male referred for evaluation of microscopic hematuria. In March, a urinalysis revealed 25 red blood cells on a microscopic exam, with an otherwise negative result. He has not had any recent upper tract imaging.   He denies any visible blood in his urine or urinary issues such as nocturia. He has no history of kidney stones.   He is a [place] veteran and has not been screened for prostate cancer, although the provider noted that it should be considered due to his service history.   He has a significant smoking history, having smoked two to three packs per day from age 56 until he quit in 1996, totaling approximately 25 years.  PMH: Past Medical History:  Diagnosis Date   CAD, ARTERY BYPASS GRAFT    a. 1996 s/p CABG x 4 (LIMA->LAD, VG->D1, VG->OM3, VG->RPDA; b. 12/2016 [facility]): EF 47%, anteroapical and inf infarcts w/ anteroapical peri-infarct ischemia; c. 01/2017: Cath: LM 72m/d, LAD 100p/m, D1 90ost, 70p, LCX 80ost/p, 161m, RCA 49m, 75d, RPDA 95ost, VG->RPDA 30p/m, VG->OM3 nl, LIMA->LAD nl, VG->D1 nl; d. 06/2019 MV: EF 52%, basal inf/mid ant/mid inf/apical ant/apical infarct w/ ischemia.   Carotid arterial disease (HCC)    a. s/p remote R CEA (Dr. Liliane Bade); b. 04/2021 Carotid U/S: 1-30% bilat ICA stenoses.   Diastolic dysfunction    a. 05/2021 Echo: EF 60-65%, no rwma, GrI DD, nl RV fxn.   Dyslipidemia    Erectile dysfunction    GERD (gastroesophageal reflux disease)    Heart attack (HCC) 1996    HYPERLIPIDEMIA-MIXED 06/09/2008   Hypertension    HYPERTENSION, UNSPECIFIED 06/09/2008   Orthostatic hypotension    Palpitations    a. 04/2021 Zio: Predominantly RSR @ 77 (60-144), 5 SVT runs - fastest 144, longest 20 beats. Occas PVCs - 5%.   PVD (peripheral vascular disease) (HCC)    a. 02/2014 s/p DBA/stent to prox R popliteal; b. 06/2021 orbital atherectomy and DCBA to L popliteal (90%). Otw moderate, nonobs PAD.   Transient ischemic attack (TIA)    Type I diabetes mellitus (HCC) dx'd 08/20/1966   Viral gastroenteritis    04/19/2010 improved   Weakness     Surgical History: Past Surgical History:  Procedure Laterality Date   ABDOMINAL AORTOGRAM W/LOWER EXTREMITY N/A 06/07/2021   Procedure: ABDOMINAL AORTOGRAM W/LOWER EXTREMITY;  Surgeon: Iran Ouch, MD;  Location: [facility];  Service: Cardiovascular;  Laterality: N/A;   CARDIAC CATHETERIZATION  1996; ~ 2010   CAROTID ENDARTERECTOMY Right    CATARACT EXTRACTION, BILATERAL Bilateral    COLONOSCOPY WITH PROPOFOL N/A 06/07/2020   Procedure: COLONOSCOPY WITH PROPOFOL;  Surgeon: Regis Bill, MD;  Location: [facility] ENDOSCOPY;  Service: Endoscopy;  Laterality: N/A;   CORONARY ANGIOPLASTY     CORONARY ARTERY BYPASS GRAFT  1996   "CABG X4"   ESOPHAGOGASTRODUODENOSCOPY N/A 06/07/2020   Procedure: ESOPHAGOGASTRODUODENOSCOPY (EGD);  Surgeon: Regis Bill, MD;  Location: [facility] ENDOSCOPY;  Service: Endoscopy;  Laterality: N/A;   LEFT HEART CATH AND  CORS/GRAFTS ANGIOGRAPHY N/A 01/11/2017   Procedure: LEFT HEART CATH AND CORS/GRAFTS ANGIOGRAPHY;  Surgeon: Dolores Patty, MD;  Location: [facility]VAS[facility] CV LAB;  Service: Cardiovascular;  Laterality: N/A;   MOHS SURGERY     PERIPHERAL VASCULAR ATHERECTOMY  06/07/2021   Procedure: PERIPHERAL VASCULAR ATHERECTOMY;  Surgeon: Iran Ouch, MD;  Location: [facility];  Service: Cardiovascular;;   PERIPHERAL VASCULAR CATHETERIZATION N/A 12/15/2014   Procedure: Abdominal  Aortogram;  Surgeon: Iran Ouch, MD;  Location: [facility]VAS[facility] CV LAB;  Service: Cardiovascular;  Laterality: N/A;   REFRACTIVE SURGERY Right    "before cataract OR"    Home Medications:  Allergies as of 05/03/2023       Reactions   Simvastatin Swelling   Lisinopril Other (See Comments)   Unknown        Medication List        Accurate as of May 03, 2023  2:01 PM. If you have any questions, ask your nurse or doctor.          STOP taking these medications    losartan 50 MG tablet Commonly known as: COZAAR Stopped by: Vanna Scotland       TAKE these medications    albuterol 108 (90 Base) MCG/ACT inhaler Commonly known as: VENTOLIN HFA Inhale 2 puffs into the lungs every 6 (six) hours as needed for wheezing or shortness of breath (Cough).   amLODipine 10 MG tablet Commonly known as: NORVASC Take 10 mg by mouth daily.   aspirin 81 MG tablet Take 81 mg by mouth at bedtime.   atorvastatin 80 MG tablet Commonly known as: LIPITOR Take 1 tablet (80 mg total) by mouth daily.   CAL-MAG-ZINC PO Take 1 tablet by mouth daily.   Calcium Carb-Cholecalciferol 600-10 MG-MCG Tabs Take 2 tablets by mouth daily.   carvedilol 6.25 MG tablet Commonly known as: COREG Take 1 tablet (6.25 mg total) by mouth 2 (two) times daily.   ezetimibe 10 MG tablet Commonly known as: ZETIA Take 10 mg by mouth daily.   fluticasone-salmeterol 250-50 MCG/ACT Aepb Commonly known as: Wixela Inhub Inhale 1 puff into the lungs in the morning and at bedtime.   glucagon 1 MG injection 1 mg once as needed (low blood glucose).   insulin aspart 100 unit/mL injection Commonly known as: novoLOG Inject into the skin continuous. INSULIN PUMP   nitroGLYCERIN 0.4 MG SL tablet Commonly known as: NITROSTAT Place 0.4 mg under the tongue every 5 (five) minutes as needed for chest pain.   omeprazole 20 MG capsule Commonly known as: PRILOSEC Take 20 mg by mouth every morning.   sertraline  100 MG tablet Commonly known as: ZOLOFT Take 100 mg by mouth daily.   Tiotropium Bromide-Olodaterol 2.5-2.5 MCG/ACT Aers Inhale into the lungs.   torsemide 20 MG tablet Commonly known as: DEMADEX Take 20 mg by mouth every other day.   traZODone 50 MG tablet Commonly known as: DESYREL Take 25 mg by mouth at bedtime.        Allergies:  Allergies  Allergen Reactions   Simvastatin Swelling   Lisinopril Other (See Comments)    Unknown    Family History: Family History  Problem Relation Age of Onset   Coronary artery disease Mother    Diabetes Mother    Stroke Other    Coronary artery disease Father    Diabetes Father     Social History:  reports that he quit smoking about 29 years ago. His smoking use included  cigarettes. He started smoking about 64 years ago. He has a 105 pack-year smoking history. He has never used smokeless tobacco. He reports that he does not currently use alcohol. He reports that he does not use drugs.   Physical Exam: BP 117/63   Pulse 67   Constitutional:  Alert and oriented, No acute distress. HEENT: [place] AT, moist mucus membranes.  Trachea midline, no masses. Neurologic: Grossly intact, no focal deficits, moving all 4 extremities. Psychiatric: Normal mood and affect.   Assessment & Plan:    1. Microscopic Hematuria - He has a history of microscopic hematuria with 25 RBCs on a previous urinalysis.  - Given his smoking history and demographic risk factors, he is considered high-risk for potential urological malignancies, including bladder cancer. - CT scan to evaluate the kidneys and ureters for potential causes such as kidney stones or masses.  - Schedule a cystoscopy to directly visualize the bladder and rule out bladder cancer.  2. Smoking History - He has a significant smoking history, having smoked 2-3 packs per day for approximately 25 years, quitting in 1996. - Reinforce the importance of smoking cessation and discuss potential  long-term health risks associated with his smoking history.  3. Prostate Cancer Screening - He is a [place] veteran and has not been screened for prostate cancer.  - Discusesd the relevance of PSA screening in the context of his age and history. - Consider PSA screening if indicated by further findings, particularly if the CT scan reveals abnormalities that warrant further investigation.  Return for CT urogram and cystoscopy.  I have reviewed the above documentation for accuracy and completeness, and I agree with the above.   Vanna Scotland, MD   [facility] [address] [place], [place] [address] [phone]

## 2023-05-03 NOTE — Patient Instructions (Signed)

## 2023-05-06 ENCOUNTER — Telehealth: Payer: Self-pay | Admitting: Urology

## 2023-05-06 NOTE — Telephone Encounter (Signed)
 Samuel Bouche advised patient

## 2023-05-06 NOTE — Telephone Encounter (Signed)
 PT called to r/s appt ([facility]) from 05/30 to 05/09. Pt is suppose to get an ct scan prior to appt and wanted to get an update on if the order is put in for to get an ct scan prior to the new date?

## 2023-05-13 ENCOUNTER — Encounter: Payer: Self-pay | Admitting: Urology

## 2023-05-14 ENCOUNTER — Ambulatory Visit
Admission: RE | Admit: 2023-05-14 | Discharge: 2023-05-14 | Disposition: A | Discharge status: Home / Self Care | Source: Ambulatory Visit | Attending: Urology | Admitting: Urology

## 2023-05-14 DIAGNOSIS — R3129 Other microscopic hematuria: Secondary | ICD-10-CM | POA: Diagnosis present

## 2023-05-14 LAB — POCT I-STAT CREATININE: Creatinine, Ser: 1.1 mg/dL (ref 0.61–1.24)

## 2023-05-14 MED ORDER — IOHEXOL 300 MG/ML  SOLN
100.0000 mL | Freq: Once | INTRAMUSCULAR | Status: AC | PRN
  Administered 2023-05-14: 100 mL via INTRAVENOUS

## 2023-05-20 ENCOUNTER — Encounter: Payer: Self-pay | Admitting: Urology

## 2023-06-14 ENCOUNTER — Other Ambulatory Visit: Admitting: Urology

## 2023-06-26 ENCOUNTER — Other Ambulatory Visit: Admitting: Urology

## 2023-06-26 ENCOUNTER — Encounter: Payer: Self-pay | Admitting: Nurse Practitioner

## 2023-06-26 ENCOUNTER — Ambulatory Visit: Attending: Nurse Practitioner | Admitting: Nurse Practitioner

## 2023-06-26 VITALS — BP 120/58 | HR 62 | Ht 72.0 in | Wt 240.2 lb

## 2023-06-26 DIAGNOSIS — E785 Hyperlipidemia, unspecified: Secondary | ICD-10-CM | POA: Insufficient documentation

## 2023-06-26 DIAGNOSIS — Z794 Long term (current) use of insulin: Secondary | ICD-10-CM | POA: Insufficient documentation

## 2023-06-26 DIAGNOSIS — R5383 Other fatigue: Secondary | ICD-10-CM

## 2023-06-26 DIAGNOSIS — I2510 Atherosclerotic heart disease of native coronary artery without angina pectoris: Secondary | ICD-10-CM | POA: Diagnosis present

## 2023-06-26 DIAGNOSIS — I10 Essential (primary) hypertension: Secondary | ICD-10-CM | POA: Diagnosis present

## 2023-06-26 DIAGNOSIS — I739 Peripheral vascular disease, unspecified: Secondary | ICD-10-CM | POA: Diagnosis present

## 2023-06-26 DIAGNOSIS — E119 Type 2 diabetes mellitus without complications: Secondary | ICD-10-CM | POA: Diagnosis present

## 2023-06-26 DIAGNOSIS — I5032 Chronic diastolic (congestive) heart failure: Secondary | ICD-10-CM | POA: Diagnosis present

## 2023-06-26 DIAGNOSIS — I779 Disorder of arteries and arterioles, unspecified: Secondary | ICD-10-CM | POA: Insufficient documentation

## 2023-06-26 NOTE — Progress Notes (Signed)
 Office Visit    Patient Name: Derek Hogan Date of Encounter: 06/26/2023  Primary Care Provider:  Jimmy Moulding, MD Primary Cardiologist:  Derek Kirks, MD  Chief Complaint    78 y.o. male with above past medical history including CAD, carotid arterial disease, hypertension, hyperlipidemia, chronic HFpEF, lower extremity peripheral arterial disease, diabetes, possible TIA in 2016, right lower extremity venous insufficiency, lumbar spinal stenosis with radiculopathy, and obesity, who presents for f/u related to fatigue and dyspnea on exertion.  Past Medical History   Subjective   Past Medical History:  Diagnosis Date   CAD, ARTERY BYPASS GRAFT    a. 1996 s/p CABG x 4 (LIMA->LAD, VG->D1, VG->OM3, VG->RPDA; b. 12/2016 [facility]): EF 47%, anteroapical and inf infarcts w/ anteroapical peri-infarct ischemia; c. 01/2017: Cath: LM 41m/d, LAD 100p/m, D1 90ost, 70p, LCX 80ost/p, 119m, RCA 20m, 75d, RPDA 95ost, VG->RPDA 30p/m, VG->OM3 nl, LIMA->LAD nl, VG->D1 nl; d. 06/2019 [facility]: EF 52%, basal inf/mid ant/mid inf/apical ant/apical infarct w/ ischemia.   Carotid arterial disease (HCC)    a. s/p remote R CEA (Dr. Azell Hogan); b. 04/2021 Carotid U/S: 1-30% bilat ICA stenoses.   Diastolic dysfunction    a. 05/2021 Echo: EF 60-65%, no rwma, GrI DD, nl RV fxn.   Dyslipidemia    Erectile dysfunction    GERD (gastroesophageal reflux disease)    Heart attack (HCC) 1996   HYPERLIPIDEMIA-MIXED 06/09/2008   Hypertension    HYPERTENSION, UNSPECIFIED 06/09/2008   Orthostatic hypotension    Palpitations    a. 04/2021 Zio: Predominantly RSR @ 77 (60-144), 5 SVT runs - fastest 144, longest 20 beats. Occas PVCs - 5%.   PVD (peripheral vascular disease) (HCC)    a. 02/2014 s/p DBA/stent to prox R popliteal; b. 06/2021 orbital atherectomy and DCBA to L popliteal (90%). Otw moderate, nonobs PAD; c. 12/2022 ABIs/Duplex: patent L popliteal.   Transient ischemic attack (TIA)    Type I diabetes mellitus  (HCC) dx'd 08/20/1966   Viral gastroenteritis    04/19/2010 improved   Weakness    Past Surgical History:  Procedure Laterality Date   ABDOMINAL AORTOGRAM W/LOWER EXTREMITY N/A 06/07/2021   Procedure: ABDOMINAL AORTOGRAM W/LOWER EXTREMITY;  Surgeon: Derek Hamilton, MD;  Location: [facility];  Service: Cardiovascular;  Laterality: N/A;   CARDIAC CATHETERIZATION  1996; ~ 2010   CAROTID ENDARTERECTOMY Right    CATARACT EXTRACTION, BILATERAL Bilateral    COLONOSCOPY WITH PROPOFOL  N/A 06/07/2020   Procedure: COLONOSCOPY WITH PROPOFOL ;  Surgeon: Derek Darling, MD;  Location: [facility] ENDOSCOPY;  Service: Endoscopy;  Laterality: N/A;   CORONARY ANGIOPLASTY     CORONARY ARTERY BYPASS GRAFT  1996   "CABG X4"   ESOPHAGOGASTRODUODENOSCOPY N/A 06/07/2020   Procedure: ESOPHAGOGASTRODUODENOSCOPY (EGD);  Surgeon: Derek Darling, MD;  Location: [facility] ENDOSCOPY;  Service: Endoscopy;  Laterality: N/A;   LEFT HEART CATH AND CORS/GRAFTS ANGIOGRAPHY N/A 01/11/2017   Procedure: LEFT HEART CATH AND CORS/GRAFTS ANGIOGRAPHY;  Surgeon: Derek Shade, MD;  Location: [facility];  Service: Cardiovascular;  Laterality: N/A;   MOHS SURGERY     PERIPHERAL VASCULAR ATHERECTOMY  06/07/2021   Procedure: PERIPHERAL VASCULAR ATHERECTOMY;  Surgeon: Derek Hamilton, MD;  Location: [facility];  Service: Cardiovascular;;   PERIPHERAL VASCULAR CATHETERIZATION N/A 12/15/2014   Procedure: Abdominal Aortogram;  Surgeon: Derek Hamilton, MD;  Location: [facility];  Service: Cardiovascular;  Laterality: N/A;   REFRACTIVE SURGERY Right    "before cataract  OR"    Allergies  Allergies  Allergen Reactions   Simvastatin Swelling   Lisinopril  Other (See Comments)    Unknown       History of Present Illness      78 y.o. y/o male with above past medical history including CAD, carotid arterial disease, hypertension, hyperlipidemia, lower extremity peripheral arterial disease, chronic HFpEF,  diabetes, possible TIA in 2016, right lower extremity venous insufficiency, lumbar spinal stenosis with radiculopathy, and obesity.  He previously underwent CABG x4 in 1996.  He quit smoking at the time of his bypass surgery.  He previously underwent right carotid endarterectomy and has been followed with serial ultrasounds over the years (March 2023 1 to 39% bilateral ICA stenoses).  In the setting of lower extremity arterial disease, he underwent drug-coated balloon angioplasty and stenting of the proximal right popliteal in January 2016.  Most recent diagnostic catheterization was performed December 2018 revealing 4 of 4 patent grafts with severe native multivessel disease, and he was medically managed.  Stress testing in 2021 was low risk with prior infarct but no ischemia.   In the setting of left foot pain and small ulceration of the left big toe April 2023, he underwent angiography in May 2023 revealing a heavily calcified 90% stenosis within the left above-the-knee popliteal artery and otherwise moderate, nonobstructive disease bilaterally.  Popliteal artery was treated with orbital atherectomy and drug-coated balloon angioplasty.  In 2023, he was evaluated at the [facility] with brain MRI dizziness.  This showed no acute infarct.  He was noted to have chronic right thalamus and left cerebellar lacunar infarcts in addition to microvascular ischemic disease with scattered cerebral microhemorrhages suggestive of amyloid angiopathy.  CTA of the head was performed at the [facility] in the setting of ongoing dizziness which showed significant stenosis in the right vertebral artery but the left vertebral artery was dominant with no obstructive disease and he has been conservatively managed.   Derek Hogan was last seen in cardiology/PV clinic in July 2024.  In the setting of vertebral disease and dizziness, he was referred to neuro interventionalists. He subsequently saw neurology and underwent sleep study in January of  this year which showed sleep disordered breathing exacerbated by supine sleeping with recommendation to avoid supine sleeping and consideration for CPAP/O2 if that was not feasible.  From a cardiac standpoint, Mr. Lucienne Ryder has noted reduced energy and generalized fatigue over the past several months.  He is also noted some increase in dyspnea on exertion.  He does not experience chest pain or claudication.  He denies palpitations, PND, orthopnea, dizziness, syncope, edema, or early satiety.  He says that he had labs at the [place] recently which looked good but he cannot pull them up on his phone today. Objective   Home Medications    Current Outpatient Medications  Medication Sig Dispense Refill   amLODipine (NORVASC) 10 MG tablet Take 10 mg by mouth daily.     aspirin  81 MG tablet Take 81 mg by mouth at bedtime.     atorvastatin  (LIPITOR ) 80 MG tablet Take 1 tablet (80 mg total) by mouth daily.     Calcium  Carb-Cholecalciferol 600-10 MG-MCG TABS Take 2 tablets by mouth daily.     Calcium -Magnesium-Zinc (CAL-MAG-ZINC PO) Take 1 tablet by mouth daily.     carvedilol  (COREG ) 6.25 MG tablet Take 1 tablet (6.25 mg total) by mouth 2 (two) times daily. 180 tablet 3   ezetimibe (ZETIA) 10 MG tablet Take 10 mg by mouth daily.  fluticasone -salmeterol (WIXELA INHUB) 250-50 MCG/ACT AEPB Inhale 1 puff into the lungs in the morning and at bedtime. 60 each 2   glucagon 1 MG injection 1 mg once as needed (low blood glucose).     insulin aspart (NOVOLOG) 100 unit/mL injection Inject into the skin continuous. INSULIN PUMP     nitroGLYCERIN  (NITROSTAT ) 0.4 MG SL tablet Place 0.4 mg under the tongue every 5 (five) minutes as needed for chest pain.      omeprazole (PRILOSEC) 20 MG capsule Take 20 mg by mouth every morning.      sertraline (ZOLOFT) 100 MG tablet Take 100 mg by mouth daily.     tamsulosin (FLOMAX) 0.4 MG CAPS capsule Take 0.4 mg by mouth.     Tiotropium Bromide-Olodaterol 2.5-2.5 MCG/ACT AERS Inhale  into the lungs.     torsemide  (DEMADEX ) 20 MG tablet Take 20 mg by mouth every other day.     traZODone (DESYREL) 50 MG tablet Take 25 mg by mouth at bedtime.     No current facility-administered medications for this visit.     Physical Exam    VS:  BP (!) 120/58 (BP Location: Left Arm, Patient Position: Sitting, Cuff Size: Normal)   Pulse 62   Ht 6' (1.829 m)   Wt 240 lb 3.2 oz (109 kg)   SpO2 95%   BMI 32.58 kg/m  , BMI Body mass index is 32.58 kg/m.       GEN: Well nourished, well developed, in no acute distress. HEENT: normal. Neck: Supple, no JVD, carotid bruits, or masses. Cardiac: RRR, 2/6 systolic murmur at the upper sternal borders.  No rubs or gallops.  No clubbing, cyanosis, edema.  Radials 2+/PT 1+ and equal bilaterally.  Respiratory:  Respirations regular and unlabored, clear to auscultation bilaterally. GI: Soft, nontender, nondistended, BS + x 4. MS: no deformity or atrophy. Skin: warm and dry, no rash. Neuro:  Strength and sensation are intact. Psych: Normal affect.  Accessory Clinical Findings    ECG personally reviewed by me today - EKG Interpretation Date/Time:  Wednesday Jun 26 2023 10:04:24 EDT Ventricular Rate:  62 PR Interval:  186 QRS Duration:  110 QT Interval:  432 QTC Calculation: 438 R Axis:   -12  Text Interpretation: Normal sinus rhythm Minimal voltage criteria for LVH, may be normal variant ( Cornell product ) Possible Inferior infarct , age undetermined Anteroseptal infarct (cited on or before 08-Jul-2010) Confirmed by Laneta Pintos [phone]) on 06/26/2023 10:29:58 AM  - no acute changes.  Lab Results  Component Value Date   WBC 10.6 (H) 02/01/2022   HGB 12.2 (L) 02/01/2022   HCT 37.5 (L) 02/01/2022   MCV 92.8 02/01/2022   PLT 232 02/01/2022   Lab Results  Component Value Date   CREATININE 1.10 05/14/2023   BUN 19 06/29/2021   NA 136 06/29/2021   K 4.4 06/29/2021   CL 105 06/29/2021   CO2 24 06/29/2021   Lab Results   Component Value Date   ALT 23 03/16/2015   AST 25 03/16/2015   ALKPHOS 62 03/16/2015   BILITOT 0.7 03/16/2015   Lab Results  Component Value Date   CHOL 116 07/08/2014   HDL 43 07/08/2014   LDLCALC 63 07/08/2014   TRIG 51 07/08/2014   CHOLHDL 2.7 07/08/2014    Lab Results  Component Value Date   HGBA1C 7.8 (H) 10/09/2012   Lab Results  Component Value Date   TSH 0.460 04/18/2010       Assessment &  Plan    1.  Coronary artery disease: Status post CABG x 4 in 1996 with most recent diagnostic catheterization 2018 revealing 4 of 4 patent grafts with severe multivessel native disease.  He has been medically managed.  He has not been having any chest pain but he has noted some progression of dyspnea on exertion as well as low energy state and fatigue.  Recent lab work at the [place] with was reportedly okay though he is unable to pull his labs up on his phone today and promises to submit them via MyChart.  In that setting, I will defer obtaining any labs today.  We did agree on pursuing a 2D echocardiogram to reevaluate LV function.  He remains on aspirin , statin, beta-blocker, Zetia, and calcium  channel blocker therapy.  2.  Hypertension: Pressure is well-controlled on beta-blocker, calcium  channel blocker, and diuretic therapy.  3.  Hyperlipidemia: Followed at the [place].  As noted above, those labs are not currently available.  Patient to provide.  He remains on statin and Zetia therapy.  4.  Chronic HFpEF: See 1.  He has been having fatigue and dyspnea on exertion which has progressed some.  He is euvolemic on examination today.  Follow-up echocardiogram.  Continue heart rate and blood pressure management with beta-blocker and calcium  channel blocker.  Continue torsemide .  5.  Peripheral arterial disease: Status post prior bilateral interventions with stable ABIs/duplex in November 2024.  No claudication at this time.  Follow-up imaging scheduled for November 2025.  6.  Carotid arterial  disease: No imaging since 2023.  Will arrange.  He remains on aspirin  and statin therapy.  7.  Type 2 diabetes mellitus: Followed at the [place].  Remains on NovoLog.  8.  Disposition: Follow-up echocardiogram and carotid ultrasound.  Follow-up in clinic in 3 months or sooner if necessary.  Laneta Pintos, NP 06/26/2023, 6:40 PM

## 2023-06-26 NOTE — Patient Instructions (Signed)
 Medication Instructions:  The current medical regimen is effective;  continue present plan and medications as directed. Please refer to the Current Medication list given to you today.   *If you need a refill on your cardiac medications before your next appointment, please call your pharmacy*  Lab Work: Please send us  lab work from [place] when you are able.   If you have labs (blood work) drawn today and your tests are completely normal, you will receive your results only by: MyChart Message (if you have MyChart) OR A paper copy in the mail If you have any lab test that is abnormal or we need to change your treatment, we will call you to review the results.  Testing/Procedures: Your physician has requested that you have a carotid duplex. This test is an ultrasound of the carotid arteries in your neck. It looks at blood flow through these arteries that supply the brain with blood.   Allow one hour for this exam.  There are no restrictions or special instructions.  This will take place at [address] [facility] [address]) [address], [place] [address]  Please note: We ask at that you not bring children with you during ultrasound (echo/ vascular) testing. Due to room size and safety concerns, children are not allowed in the ultrasound rooms during exams. Our front office staff cannot provide observation of children in our lobby area while testing is being conducted. An adult accompanying a patient to their appointment will only be allowed in the ultrasound room at the discretion of the ultrasound technician under special circumstances. We apologize for any inconvenience.   Your physician has requested that you have an echocardiogram. Echocardiography is a painless test that uses sound waves to create images of your heart. It provides your doctor with information about the size and shape of your heart and how well your heart's chambers and valves are working.   You may receive an ultrasound  enhancing agent through an IV if needed to better visualize your heart during the echo. This procedure takes approximately one hour.  There are no restrictions for this procedure.  This will take place at [address] [facility] [address]) [address], [place] [address]  Please note: We ask at that you not bring children with you during ultrasound (echo/ vascular) testing. Due to room size and safety concerns, children are not allowed in the ultrasound rooms during exams. Our front office staff cannot provide observation of children in our lobby area while testing is being conducted. An adult accompanying a patient to their appointment will only be allowed in the ultrasound room at the discretion of the ultrasound technician under special circumstances. We apologize for any inconvenience.   Follow-Up: At [facility], you and your health needs are our priority.  As part of our continuing mission to provide you with exceptional heart care, our providers are all part of one team.  This team includes your primary Cardiologist (physician) and Advanced Practice Providers or APPs (Physician Assistants and Nurse Practitioners) who all work together to provide you with the care you need, when you need it.  Your next appointment:   3 month(s)  Provider:   Antionette Kirks, MD or Laneta Pintos, NP    We recommend signing up for the patient portal called "MyChart".  Sign up information is provided on this After Visit Summary.  MyChart is used to connect with patients for Virtual Visits (Telemedicine).  Patients are able to view lab/test results, encounter notes, upcoming  appointments, etc.  Non-urgent messages can be sent to your provider as well.   To learn more about what you can do with MyChart, go to [email or web address].

## 2023-06-29 LAB — TSH: TSH: 1.82 u[IU]/mL (ref 0.450–4.500)

## 2023-07-02 ENCOUNTER — Ambulatory Visit: Payer: Self-pay | Admitting: Nurse Practitioner

## 2023-07-05 ENCOUNTER — Other Ambulatory Visit: Admitting: Urology

## 2023-07-10 ENCOUNTER — Ambulatory Visit: Payer: Self-pay | Admitting: Nurse Practitioner

## 2023-07-10 ENCOUNTER — Ambulatory Visit: Attending: Nurse Practitioner

## 2023-07-10 DIAGNOSIS — I2510 Atherosclerotic heart disease of native coronary artery without angina pectoris: Secondary | ICD-10-CM | POA: Insufficient documentation

## 2023-07-10 LAB — ECHOCARDIOGRAM COMPLETE
AR max vel: 1.61 cm2
AV Area VTI: 1.75 cm2
AV Area mean vel: 1.62 cm2
AV Mean grad: 3 mmHg
AV Peak grad: 6 mmHg
Ao pk vel: 1.22 m/s
Area-P 1/2: 2.87 cm2
Calc EF: 60.7 %
S' Lateral: 3.8 cm
Single Plane A2C EF: 57.5 %
Single Plane A4C EF: 63.5 %

## 2023-07-11 ENCOUNTER — Ambulatory Visit (HOSPITAL_COMMUNITY)
Admission: RE | Admit: 2023-07-11 | Discharge: 2023-07-11 | Disposition: A | Discharge status: Home / Self Care | Source: Ambulatory Visit | Attending: Nurse Practitioner | Admitting: Nurse Practitioner

## 2023-07-11 DIAGNOSIS — I6522 Occlusion and stenosis of left carotid artery: Secondary | ICD-10-CM | POA: Diagnosis not present

## 2023-07-11 DIAGNOSIS — I779 Disorder of arteries and arterioles, unspecified: Secondary | ICD-10-CM | POA: Diagnosis present

## 2023-07-15 ENCOUNTER — Other Ambulatory Visit: Payer: Self-pay

## 2023-07-15 DIAGNOSIS — D075 Carcinoma in situ of prostate: Secondary | ICD-10-CM

## 2023-07-15 DIAGNOSIS — C61 Malignant neoplasm of prostate: Secondary | ICD-10-CM

## 2023-07-18 ENCOUNTER — Ambulatory Visit: Payer: Self-pay | Admitting: Urology

## 2023-07-22 ENCOUNTER — Ambulatory Visit
Admission: RE | Admit: 2023-07-22 | Discharge: 2023-07-22 | Disposition: A | Discharge status: Home / Self Care | Source: Ambulatory Visit

## 2023-07-22 DIAGNOSIS — J849 Interstitial pulmonary disease, unspecified: Secondary | ICD-10-CM | POA: Insufficient documentation

## 2023-07-22 DIAGNOSIS — C61 Malignant neoplasm of prostate: Secondary | ICD-10-CM | POA: Insufficient documentation

## 2023-07-22 MED ORDER — FLOTUFOLASTAT F 18 GALLIUM 296-5846 MBQ/ML IV SOLN
8.0000 | Freq: Once | INTRAVENOUS | Status: DC
  Filled 2023-07-22: qty 8

## 2023-07-22 MED ORDER — FLOTUFOLASTAT F 18 GALLIUM 296-5846 MBQ/ML IV SOLN
8.0000 | Freq: Once | INTRAVENOUS | Status: AC
Start: 2023-07-22 — End: 2023-07-22
  Administered 2023-07-22: 8.16 via INTRAVENOUS
  Filled 2023-07-22: qty 8

## 2023-09-27 ENCOUNTER — Encounter: Payer: Self-pay | Admitting: Cardiovascular Disease

## 2023-09-27 ENCOUNTER — Ambulatory Visit: Attending: Cardiovascular Disease | Admitting: Cardiovascular Disease

## 2023-09-27 VITALS — BP 112/58 | HR 64 | Ht 72.0 in | Wt 241.5 lb

## 2023-09-27 DIAGNOSIS — E785 Hyperlipidemia, unspecified: Secondary | ICD-10-CM | POA: Insufficient documentation

## 2023-09-27 DIAGNOSIS — I2510 Atherosclerotic heart disease of native coronary artery without angina pectoris: Secondary | ICD-10-CM | POA: Diagnosis present

## 2023-09-27 DIAGNOSIS — I10 Essential (primary) hypertension: Secondary | ICD-10-CM | POA: Diagnosis present

## 2023-09-27 DIAGNOSIS — I779 Disorder of arteries and arterioles, unspecified: Secondary | ICD-10-CM | POA: Insufficient documentation

## 2023-09-27 DIAGNOSIS — I872 Venous insufficiency (chronic) (peripheral): Secondary | ICD-10-CM | POA: Diagnosis present

## 2023-09-27 DIAGNOSIS — I739 Peripheral vascular disease, unspecified: Secondary | ICD-10-CM | POA: Insufficient documentation

## 2023-09-27 NOTE — Patient Instructions (Signed)

## 2023-09-27 NOTE — Progress Notes (Signed)
 Cardiology Office Note   Date:  09/27/2023   ID:  Nolen, Lindamood 16-Mar-1945, MRN [id number]  PCP:  [name] ORN, MD  Cardiologist:  Dr. Darron  Chief Complaint  Patient presents with   Follow-up    3 month f/u c/o fatigue and sob. Meds reviewed verbally with pt.      History of Present Illness: Derek Hogan is a 78 y.o. male who presents for a follow-up visit regarding coronary artery disease and peripheral arterial disease.  He has known history of CAD S/P previous inferior wall myocardial infarction followed by CABG in 1996. He had questionable TIA in March 2016. He quit smoking in 1996 after CABG. He has prolonged history of diabetes currently on insulin pump, Carotid disease status post right carotid endarterectomy, hyperlipidemia and hypertension. He is known to have peripheral arterial disease.  He had drug-coated balloon angioplasty followed by self-expanding stent placement to the proximal right popliteal artery for severe claudication in November 2016. He has nonhealing ulceration in the left big toe in 2023.  Angiography in May 2023 showed no significant aortoiliac disease.  On the left side, there was moderately calcified common femoral artery stenosis, mild nonobstructive SFA disease and severe calcified proximal popliteal artery disease with three-vessel runoff below the knee.  I performed successful orbital atherectomy and drug-coated balloon angioplasty to the left popliteal artery.   Echocardiogram in August 2022 showed normal LV systolic function with grade 1 diastolic dysfunction and no significant valvular abnormalities.    He had sleep study done at the [facility] which showed mild obstructive sleep apnea.    He had some dizziness in late 2023 and thus was evaluated at the [facility] with a brain MRI which showed no acute infarct.  He was noted to have chronic right thalamus and left cerebellar lacunar infarcts in addition to microvascular ischemic disease with  scattered cerebral microhemorrhages suggestive of amyloid angiopathy  He had recent echocardiogram which showed normal LV systolic function with grade 2 diastolic dysfunction.  The images were personally reviewed by me.  Carotid Doppler showed mild nonobstructive disease bilaterally.  He had an emergency room visit in June due to passing out from hypoglycemia.  His insulin pump was adjusted.  CT head was unremarkable.  He is doing well with no chest pain or worsening dyspnea.  He has stable lower extremity edema.  He likes to go on cruise trips with his wife.  They went to [place] last year and they are planning a trip to [place] soon.   Past Medical History:  Diagnosis Date   CAD, ARTERY BYPASS GRAFT    a. 1996 s/p CABG x 4 (LIMA->LAD, VG->D1, VG->OM3, VG->RPDA; b. 12/2016 [facility]): EF 47%, anteroapical and inf infarcts w/ anteroapical peri-infarct ischemia; c. 01/2017: Cath: LM 10m/d, LAD 100p/m, D1 90ost, 70p, LCX 80ost/p, 130m, RCA 87m, 75d, RPDA 95ost, VG->RPDA 30p/m, VG->OM3 nl, LIMA->LAD nl, VG->D1 nl; d. 06/2019 [facility]: EF 52%, basal inf/mid ant/mid inf/apical ant/apical infarct w/ ischemia.   Carotid arterial disease (HCC)    a. s/p remote R CEA (Dr. Cathlyn Hint); b. 04/2021 Carotid U/S: 1-30% bilat ICA stenoses.   Diastolic dysfunction    a. 05/2021 Echo: EF 60-65%, no rwma, GrI DD, nl RV fxn.   Dyslipidemia    Erectile dysfunction    GERD (gastroesophageal reflux disease)    Heart attack (HCC) 1996   HYPERLIPIDEMIA-MIXED 06/09/2008   Hypertension    HYPERTENSION, UNSPECIFIED 06/09/2008   Orthostatic hypotension  Palpitations    a. 04/2021 Zio: Predominantly RSR @ 77 (60-144), 5 SVT runs - fastest 144, longest 20 beats. Occas PVCs - 5%.   Prostate cancer (HCC)    PVD (peripheral vascular disease) (HCC)    a. 02/2014 s/p DBA/stent to prox R popliteal; b. 06/2021 orbital atherectomy and DCBA to L popliteal (90%). Otw moderate, nonobs PAD; c. 12/2022 ABIs/Duplex: patent L popliteal.    Transient ischemic attack (TIA)    Type I diabetes mellitus (HCC) dx'd 08/20/1966   Viral gastroenteritis    04/19/2010 improved   Weakness     Past Surgical History:  Procedure Laterality Date   ABDOMINAL AORTOGRAM W/LOWER EXTREMITY N/A 06/07/2021   Procedure: ABDOMINAL AORTOGRAM W/LOWER EXTREMITY;  Surgeon: Darron Deatrice LABOR, MD;  Location: [facility];  Service: Cardiovascular;  Laterality: N/A;   CARDIAC CATHETERIZATION  1996; ~ 2010   CAROTID ENDARTERECTOMY Right    CATARACT EXTRACTION, BILATERAL Bilateral    COLONOSCOPY WITH PROPOFOL  N/A 06/07/2020   Procedure: COLONOSCOPY WITH PROPOFOL ;  Surgeon: Maryruth Ole DASEN, MD;  Location: [facility] ENDOS[facility];  Service: Endoscopy;  Laterality: N/A;   CORONARY ANGIOPLASTY     CORONARY ARTERY BYPASS GRAFT  1996   CABG X4   ESOPHAGOGASTRODUODENOSCOPY N/A 06/07/2020   Procedure: ESOPHAGOGASTRODUODENOSCOPY (EGD);  Surgeon: Maryruth Ole DASEN, MD;  Location: [facility];  Service: Endoscopy;  Laterality: N/A;   LEFT HEART CATH AND CORS/GRAFTS ANGIOGRAPHY N/A 01/11/2017   Procedure: LEFT HEART CATH AND CORS/GRAFTS ANGIOGRAPHY;  Surgeon: Cherrie Toribio SAUNDERS, MD;  Location: [facility];  Service: Cardiovascular;  Laterality: N/A;   MOHS SURGERY     PERIPHERAL VASCULAR ATHERECTOMY  06/07/2021   Procedure: PERIPHERAL VASCULAR ATHERECTOMY;  Surgeon: Darron Deatrice LABOR, MD;  Location: [facility];  Service: Cardiovascular;;   PERIPHERAL VASCULAR CATHETERIZATION N/A 12/15/2014   Procedure: Abdominal Aortogram;  Surgeon: Deatrice LABOR Darron, MD;  Location: [facility];  Service: Cardiovascular;  Laterality: N/A;   REFRACTIVE SURGERY Right    before cataract OR     Current Outpatient Medications  Medication Sig Dispense Refill   amLODipine (NORVASC) 10 MG tablet Take 10 mg by mouth daily.     aspirin  81 MG tablet Take 81 mg by mouth at bedtime.     atorvastatin  (LIPITOR ) 80 MG tablet Take 1 tablet (80 mg total) by mouth daily.      bisacodyl (DULCOLAX) 5 MG EC tablet Take 5 mg by mouth daily as needed.     Calcium  Carb-Cholecalciferol 600-10 MG-MCG TABS Take 2 tablets by mouth daily.     Calcium -Magnesium-Zinc (CAL-MAG-ZINC PO) Take 1 tablet by mouth daily.     carvedilol  (COREG ) 6.25 MG tablet Take 1 tablet (6.25 mg total) by mouth 2 (two) times daily. 180 tablet 3   ezetimibe (ZETIA) 10 MG tablet Take 10 mg by mouth daily.     fluticasone -salmeterol (WIXELA INHUB) 250-50 MCG/ACT AEPB Inhale 1 puff into the lungs in the morning and at bedtime. 60 each 2   glucagon 1 MG injection 1 mg once as needed (low blood glucose).     insulin aspart (NOVOLOG) 100 unit/mL injection Inject into the skin continuous. INSULIN PUMP     nitroGLYCERIN  (NITROSTAT ) 0.4 MG SL tablet Place 0.4 mg under the tongue every 5 (five) minutes as needed for chest pain.      omeprazole (PRILOSEC) 20 MG capsule Take 20 mg by mouth every morning.      sertraline (ZOLOFT) 100 MG tablet Take 100  mg by mouth daily.     tamsulosin (FLOMAX) 0.4 MG CAPS capsule Take 0.4 mg by mouth.     Tiotropium Bromide-Olodaterol 2.5-2.5 MCG/ACT AERS Inhale into the lungs.     torsemide  (DEMADEX ) 20 MG tablet Take 20 mg by mouth every other day.     traZODone (DESYREL) 50 MG tablet Take 25 mg by mouth at bedtime.     No current facility-administered medications for this visit.    Allergies:   Simvastatin and Lisinopril     Social History:  The patient  reports that he quit smoking about 29 years ago. His smoking use included cigarettes. He started smoking about 64 years ago. He has a 105 pack-year smoking history. He has never used smokeless tobacco. He reports that he does not currently use alcohol. He reports that he does not use drugs.   Family History:  The patient's family history includes Coronary artery disease in his father and mother; Diabetes in his father and mother; Stroke in an other family member.    ROS:  Please see the history of present illness.    Otherwise, review of systems are positive for none.   All other systems are reviewed and negative.    PHYSICAL EXAM: VS:  BP (!) 112/58 (BP Location: Left Arm, Patient Position: Sitting, Cuff Size: Large)   Pulse 64   Ht 6' (1.829 m)   Wt 241 lb 8 oz (109.5 kg)   SpO2 97%   BMI 32.75 kg/m  , BMI Body mass index is 32.75 kg/m. GEN: Well nourished, well developed, in no acute distress  HEENT: normal  Neck: no JVD, or masses. Right carotid bruit Cardiac: RRR; no murmurs, rubs, or gallops, trace right leg swelling. Respiratory:  clear to auscultation bilaterally, normal work of breathing GI: soft, nontender, nondistended, + BS MS: no deformity or atrophy  Skin: warm and dry, no rash Neuro:  Strength and sensation are intact Psych: euthymic mood, full affect    EKG:  EKG is ordered today. EKG showed : Sinus rhythm with frequent Premature ventricular complexes Septal infarct (cited on or before 08-Jul-2010) When compared with ECG of 03-Jun-2019 14:27, No significant change was found    Recent Labs: 05/14/2023: Creatinine, Ser 1.10 06/28/2023: TSH 1.820    Lipid Panel    Component Value Date/Time   CHOL 116 07/08/2014 0830   TRIG 51 07/08/2014 0830   HDL 43 07/08/2014 0830   CHOLHDL 2.7 07/08/2014 0830   CHOLHDL 2.7 07/19/2010 0839   VLDL 10 07/19/2010 0839   LDLCALC 63 07/08/2014 0830      Wt Readings from Last 3 Encounters:  09/27/23 241 lb 8 oz (109.5 kg)  06/26/23 240 lb 3.2 oz (109 kg)  02/04/23 239 lb (108.4 kg)        ASSESSMENT AND PLAN:  1.  Peripheral arterial disease: Status post angioplasty and stent placement to the right popliteal artery.  Status post orbital atherectomy and drug-coated balloon angioplasty of the left popliteal artery.  Most recent Doppler studies showed patent SFA and popliteal arteries.  Repeat studies in November of this year.   2. Coronary artery disease involving native coronary arteries without angina: He is doing reasonably  well overall with no anginal symptoms.  Continue medical therapy.    Most recent Lexiscan  Myoview  in 2021 was low risk.    3. Hyperlipidemia: Continue treatment with high dose atorvastatin  with a target LDL of less than 70.  Most available lipid profile from 2022 showed an LDL  of 19.  He gets labs done at the [facility].  4. Carotid disease status post right carotid endarterectomy: Recent carotid Doppler showed mild nonobstructive disease.  5.  Chronic venous insufficiency affecting the right lower extremity: Continue support stockings.  Symptoms are stable.  6.  Essential hypertension: Blood pressures controlled on current medications.  7.  Chronic diastolic heart failure: Recent echocardiogram showed grade 2 diastolic dysfunction.  He appears to be euvolemic on small dose torsemide  every other day.  No plans to start an SGLT2 inhibitor at this time given that he is on an insulin pump with recent issues of hypoglycemia.   Disposition: Follow-up in 6 month.   Signed,  [name], MD  09/27/2023 8:29 AM    [facility]

## 2023-12-13 ENCOUNTER — Ambulatory Visit (HOSPITAL_COMMUNITY): Admission: RE | Admit: 2023-12-13

## 2023-12-22 NOTE — Progress Notes (Addendum)
 PROVIDER NOTE: Interpretation of information contained herein should be left to medically-trained personnel. Specific patient instructions are provided elsewhere under Patient Instructions section of medical record. This document was created in part using AI and STT-dictation technology, any transcriptional errors that may result from this process are unintentional.  Patient: Derek Hogan  Service: E/M Encounter  Provider: Eric DELENA Como, MD  DOB: November 13, 1945  Delivery: Face-to-face  Specialty: Interventional Pain Management  MRN: [id number]  Setting: Ambulatory outpatient facility  Specialty designation: 09  Type: New Patient  Location: Outpatient office facility  PCP: Derek Layman ORN, MD  DOS: 12/30/2023    Referring Prov.: Derek Ronal BRAVO, MD   Primary Reason(s) for Visit: Encounter for initial evaluation of one or more chronic problems (new to examiner) potentially causing chronic pain, and posing a threat to normal musculoskeletal function. (Level of risk: High) CC: Back Pain (Lower bilateral )  HPI  Derek Hogan is a 78 y.o. year old, male patient, who comes for the first time to our practice referred by Derek Ronal BRAVO, MD for our initial evaluation of his chronic pain. He has Type 2 diabetes mellitus with unspecified complications (HCC); Hyperlipidemia LDL goal <70; ERECTILE DYSFUNCTION; Essential hypertension; Coronary atherosclerosis of native coronary artery; Carotid stenosis; HYPOTENSION, ORTHOSTATIC; Hypotension, unspecified; DIZZINESS; Shortness of breath; MYOCARDIAL PERFUSION SCAN, WITH STRESS TEST, ABNORMAL; Syncope; Leukocytosis; Precordial pain; Cough; Malaise and fatigue; PAD (peripheral artery disease); Peripheral vascular angioplasty status; Contusion of foot; OSA (obstructive sleep apnea); Anatomical narrow angle borderline glaucoma; Cataract extraction status, unspecified eye; Contusion of right foot; Fracture of one rib, right side, initial encounter for closed  fracture; Gastro-esophageal reflux disease without esophagitis; Insomnia; Insulin pump status; Lens replaced by other means; Lacunar infarction (HCC); Spondylosis without myelopathy or radiculopathy, lumbar region; Myofascial pain; Onychomycosis; Obesity; Low back pain potentially associated with radiculopathy; Plantar fascial fibromatosis; Sacroiliitis; Sciatica; Senile nuclear sclerosis; Spinal stenosis of lumbar region; Squamous cell carcinoma of skin; Transient cerebral ischemic attack, unspecified; Cardiac arrhythmia, unspecified; Loud snoring; History of sleep apnea; 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); Asthma; Carcinoma of prostate (HCC); Malignant neoplasm of prostate (HCC); Adult bronchiectasis (HCC); Chronic obstructive pulmonary disease, unspecified (HCC); Corns and callosities; Epistaxis; Gross hematuria; Heartburn; Interstitial lung disease (HCC); Male erectile disorder (CODE); Nausea; Open wound of left great toe; Other abnormalities of gait and mobility; Encounter for adjustment and management of vascular access device; Encounter for fitting and adjustment of insulin pump; Other specified counseling; Pain in left knee; Rising PSA following treatment for malignant neoplasm of prostate; Scl-70 antibody positive; Type 2 diabetes mellitus with diabetic neuropathy, unspecified (HCC); 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 Today he comes in for evaluation of his Back Pain (Lower bilateral )  Pain Assessment: Location: Lower, Right, Left Back Radiating: Denies Onset: More than a month ago Duration: Chronic pain Quality: Aching, Sharp (annoying and distressing) Severity: 9 /10 (subjective, self-reported pain score)  Effect on ADL: Limits ADLs Timing: Constant Modifying factors: Sitting BP: 139/64  HR:  70  Onset and Duration: Gradual and Present longer than 3 months Cause of pain: Unknown Severity: Getting worse Timing: Not influenced by the time of the day, During activity or exercise, and After activity or exercise Aggravating Factors: Bending, Kneeling, Lifiting, Prolonged sitting, Prolonged standing, Walking, Walking uphill, and Walking downhill Alleviating Factors: Bending Associated Problems: Night-time cramps Quality of Pain: Aching, Annoying, Distressing, and Sharp Previous Examinations or Tests: Bone scan, CT scan, and MRI scan Previous Treatments: Epidural steroid injections and Radiofrequency  Derek Hogan is being evaluated for possible interventional pain management therapies for the treatment of his chronic pain.  Discussed the use of AI scribe software for clinical note transcription with the patient, who gave verbal consent to proceed.  History of Present Illness   Derek Hogan is a 78 year old male who presents for an initial evaluation of worsening chronic low back pain.  He experiences bilateral low back pain that has worsened over the past three years, with no side being more affected than the other. The pain sometimes extends to the thigh, particularly after physical activities like yard work, and can persist for days. He has undergone multiple interventional procedures, including bilateral lumbar facet blocks, radiofrequency ablation, sacroiliac joint blocks, and lumbar epidural steroid injections at the L4/5 level, all  providing minimal relief. A bilateral lumbar spinal cord stimulator trial also failed to provide relief. There is no current pain, numbness, or weakness radiating down the legs, and no nerve conduction studies have been performed. He recalls an L2 fracture at age 38, treated with a brace, but without significant pain at that time.        Derek Hogan has been informed that this initial visit was an evaluation only.  On the follow up appointment I will go over the results, including ordered tests and available interventional therapies. At that time he will have the opportunity to decide whether to proceed with offered therapies or not. In the event that Derek Hogan prefers avoiding interventional options, this will conclude our involvement in the case.  Medication management recommendations may be provided upon request.  Patient informed that diagnostic tests may be ordered to assist in identifying underlying causes, narrow the list of differential diagnoses and aid in determining candidacy for (or contraindications to) planned therapeutic interventions.  Historic Controlled Substance Pharmacotherapy Review PMP and historical list of controlled substances: Zolpidem  5 mg tablet, 1 tab p.o. at bedtime (# 14) (last filled on 09/12/2022); gabapentin 300 mg capsule, 1 cap p.o. 3 times daily (# 90) (last filled on 05/01/2022) Most recently prescribed controlled substance(s): Opioid Analgesic: None MME/day: 0 mg/day  Historical Monitoring: The patient  reports no history of drug use. List of prior UDS Testing: No results found for: MDMA, COCAINSCRNUR, PCPSCRNUR, PCPQUANT, CANNABQUANT, THCU, ETH, CBDTHCR, D8THCCBX, D9THCCBX Historical Background Evaluation: [place] PMP: PDMP reviewed during this encounter. Review of the past 51-months conducted.             PMP NARX Score Report:  Narcotic: 010 Sedative: 010 Stimulant: 000  Department of public safety, offender search: Engineer, Mining  Information) Non-contributory Risk Assessment Profile: Aberrant behavior: None observed or detected today Risk factors for fatal opioid overdose: None identified today PMP NARX Overdose Risk Score: 040 Fatal overdose hazard ratio (HR): Calculation deferred Non-fatal overdose hazard ratio (HR): Calculation deferred Risk of opioid abuse or dependence: 0.7-3.0% with doses <= 36 MME/day and 6.1-26% with doses >= 120 MME/day. Substance use disorder (SUD) risk level: See below Personal History of  Substance Abuse (SUD-Substance use disorder):  Alcohol: Positive Male or Male  Illegal Drugs: Negative  Rx Drugs: Negative  ORT Risk Level calculation: Moderate Risk  Opioid Risk Tool - 12/30/23 1050       Family History of Substance Abuse   Alcohol Positive Male    Illegal Drugs Negative    Rx Drugs Negative      Personal History of Substance Abuse   Alcohol Positive Male or Male    Illegal Drugs Negative    Rx Drugs Negative      Age   Age between 109-45 years  No      History of Preadolescent Sexual Abuse   History of Preadolescent Sexual Abuse Negative or Male      Psychological Disease   Psychological Disease Negative    Depression Positive      Total Score   Opioid Risk Tool Scoring 7    Opioid Risk Interpretation Moderate Risk         ORT Scoring interpretation table:  Score <3 = Low Risk for SUD  Score between 4-7 = Moderate Risk for SUD  Score >8 = High Risk for Opioid Abuse   PHQ-2 Depression Scale:  Total score: 0  PHQ-2 Scoring interpretation table: (Score and probability of major depressive disorder)  Score 0 = No depression  Score 1 = 15.4% Probability  Score 2 = 21.1% Probability  Score 3 = 38.4% Probability  Score 4 = 45.5% Probability  Score 5 = 56.4% Probability  Score 6 = 78.6% Probability   PHQ-9 Depression Scale:  Total score: 0  PHQ-9 Scoring interpretation table:  Score 0-4 = No depression  Score 5-9 = Mild depression  Score 10-14 =  Moderate depression  Score 15-19 = Moderately severe depression  Score 20-27 = Severe depression (2.4 times higher risk of SUD and 2.89 times higher risk of overuse)   Pharmacologic Plan: As per protocol, I have not taken over any controlled substance management, pending the results of ordered tests and/or consults.            Initial impression: Pending review of available data and ordered tests.  Meds   Current Outpatient Medications:    amLODipine (NORVASC) 10 MG tablet, Take 10 mg by mouth daily., Disp: , Rfl:    aspirin  81 MG tablet, Take 81 mg by mouth at bedtime., Disp: , Rfl:    atorvastatin  (LIPITOR ) 80 MG tablet, Take 1 tablet (80 mg total) by mouth daily., Disp: , Rfl:    bisacodyl (DULCOLAX) 5 MG EC tablet, Take 5 mg by mouth daily as needed., Disp: , Rfl:    Calcium  Carb-Cholecalciferol 600-10 MG-MCG TABS, Take 2 tablets by mouth daily., Disp: , Rfl:    Calcium -Magnesium-Zinc (CAL-MAG-ZINC PO), Take 1 tablet by mouth daily., Disp: , Rfl:    carvedilol  (COREG ) 6.25 MG tablet, Take 1 tablet (6.25 mg total) by mouth 2 (two) times daily., Disp: 180 tablet, Rfl: 3   ezetimibe (ZETIA) 10 MG tablet, Take 10 mg by mouth daily., Disp: , Rfl:    fluticasone -salmeterol (WIXELA INHUB) 250-50 MCG/ACT AEPB, Inhale 1 puff into the lungs in the morning and at bedtime., Disp: 60 each, Rfl: 2   glucagon 1 MG injection, 1 mg once as needed (low blood glucose)., Disp: , Rfl:    insulin aspart (NOVOLOG) 100 unit/mL injection, Inject into the skin continuous. INSULIN PUMP, Disp: , Rfl:    nitroGLYCERIN  (NITROSTAT ) 0.4 MG SL tablet, Place 0.4 mg under the tongue every  5 (five) minutes as needed for chest pain. , Disp: , Rfl:    omeprazole (PRILOSEC) 20 MG capsule, Take 20 mg by mouth every morning. , Disp: , Rfl:    sertraline (ZOLOFT) 100 MG tablet, Take 100 mg by mouth daily., Disp: , Rfl:    tamsulosin (FLOMAX) 0.4 MG CAPS capsule, Take 0.4 mg by mouth., Disp: , Rfl:    Tiotropium  Bromide-Olodaterol 2.5-2.5 MCG/ACT AERS, Inhale into the lungs., Disp: , Rfl:    torsemide  (DEMADEX ) 20 MG tablet, Take 20 mg by mouth every other day., Disp: , Rfl:    traZODone (DESYREL) 50 MG tablet, Take 25 mg by mouth at bedtime., Disp: , Rfl:   Imaging Review   Complexity Note: No results found under the [facility] electronic medical record.                         ROS  Cardiovascular: Heart trouble, Daily Aspirin  intake, High blood pressure, Heart attack ( Date: 04/1994), and Heart surgery Pulmonary or Respiratory: Snoring  Neurological: No reported neurological signs or symptoms such as seizures, abnormal skin sensations, urinary and/or fecal incontinence, being born with an abnormal open spine and/or a tethered spinal cord Psychological-Psychiatric: No reported psychological or psychiatric signs or symptoms such as difficulty sleeping, anxiety, depression, delusions or hallucinations (schizophrenial), mood swings (bipolar disorders) or suicidal ideations or attempts Gastrointestinal: Reflux or heatburn Genitourinary: No reported renal or genitourinary signs or symptoms such as difficulty voiding or producing urine, peeing blood, non-functioning kidney, kidney stones, difficulty emptying the bladder, difficulty controlling the flow of urine, or chronic kidney disease Hematological: Brusing easily and Bleeding easily Endocrine: High blood sugar requiring insulin (IDDM) Rheumatologic: No reported rheumatological signs and symptoms such as fatigue, joint pain, tenderness, swelling, redness, heat, stiffness, decreased range of motion, with or without associated rash Musculoskeletal: Negative for myasthenia gravis, muscular dystrophy, multiple sclerosis or malignant hyperthermia Work History: Retired  Allergies  Derek Hogan is allergic to simvastatin and lisinopril .  Laboratory Chemistry Profile   Renal Lab Results  Component Value Date   BUN 19 06/29/2021   CREATININE 1.10  05/14/2023   BCR 17 05/09/2021   GFRAA >60 01/10/2017   GFRNONAA >60 06/29/2021   PROTEINUR NEGATIVE 05/03/2023     Electrolytes Lab Results  Component Value Date   NA 136 06/29/2021   K 4.4 06/29/2021   CL 105 06/29/2021   CALCIUM  9.1 06/29/2021     Hepatic Lab Results  Component Value Date   AST 25 03/16/2015   ALT 23 03/16/2015   ALBUMIN 3.7 03/16/2015   ALKPHOS 62 03/16/2015     ID Lab Results  Component Value Date   SARSCOV2NAA NEGATIVE 11/25/2021     Bone No results found for: VD25OH, [id number], CI6874NY7, CI7874NY7, 25OHVITD1, 25OHVITD2, 25OHVITD3, TESTOFREE, TESTOSTERONE   Endocrine Lab Results  Component Value Date   GLUCOSE 130 (H) 06/29/2021   GLUCOSEU NEGATIVE 05/03/2023   HGBA1C 7.8 (H) 10/09/2012   TSH 1.820 06/28/2023   CRTSLPL  04/18/2010    7.5 (NOTE)  AM:  4.3 - 22.4 ug/dL PM:  3.1 - 83.2 ug/dL     Neuropathy Lab Results  Component Value Date   VITAMINB12 948 (H) 06/29/2021   HGBA1C 7.8 (H) 10/09/2012     CNS No results found for: COLORCSF, APPEARCSF, RBCCOUNTCSF, WBCCSF, POLYSCSF, LYMPHSCSF, EOSCSF, PROTEINCSF, GLUCCSF, JCVIRUS, CSFOLI, IGGCSF, LABACHR, ACETBL   Inflammation (CRP: Acute  ESR: Chronic) Lab Results  Component Value Date  CRP 0.5 02/01/2022   ESRSEDRATE 17 02/01/2022     Rheumatology Lab Results  Component Value Date   ANA Negative 03/19/2022     Coagulation Lab Results  Component Value Date   INR 1.07 01/10/2017   LABPROT 13.8 01/10/2017   PLT 232 02/01/2022     Cardiovascular Lab Results  Component Value Date   BNP 165.4 (H) 06/13/2021   CKTOTAL 255 (H) 07/08/2010   CKMB 4.8 (H) 07/08/2010   TROPONINI <0.03 10/24/2015   HGB 12.2 (L) 02/01/2022   HCT 37.5 (L) 02/01/2022     Screening Lab Results  Component Value Date   SARSCOV2NAA NEGATIVE 11/25/2021     Cancer No results found for: CEA, CA125, LABCA2   Allergens No results found for:  ALMOND, APPLE, ASPARAGUS, AVOCADO, BANANA, BARLEY, BASIL, BAYLEAF, GREENBEAN, LIMABEAN, WHITEBEAN, BEEFIGE, REDBEET, BLUEBERRY, BROCCOLI, CABBAGE, MELON, CARROT, CASEIN, CASHEWNUT, CAULIFLOWER, CELERY     Note: Lab results reviewed.  PFSH  Drug: Derek Hogan  reports no history of drug use. Alcohol:  reports that he does not currently use alcohol. Tobacco:  reports that he quit smoking about 29 years ago. His smoking use included cigarettes. He started smoking about 64 years ago. He has a 105 pack-year smoking history. He has never used smokeless tobacco. Medical:  has a past medical history of CAD, ARTERY BYPASS GRAFT, Carotid arterial disease, Diastolic dysfunction, Dyslipidemia, Erectile dysfunction, GERD (gastroesophageal reflux disease), Heart attack (HCC) (1996), HYPERLIPIDEMIA-MIXED (06/09/2008), Hypertension, HYPERTENSION, UNSPECIFIED (06/09/2008), Orthostatic hypotension, Palpitations, Prostate cancer (HCC), PVD (peripheral vascular disease), Transient ischemic attack (TIA), Type I diabetes mellitus (HCC) (dx'd 08/20/1966), Viral gastroenteritis, and Weakness. Family: family history includes Coronary artery disease in his father and mother; Diabetes in his father and mother; Stroke in an other family member.  Past Surgical History:  Procedure Laterality Date   ABDOMINAL AORTOGRAM W/LOWER EXTREMITY N/A 06/07/2021   Procedure: ABDOMINAL AORTOGRAM W/LOWER EXTREMITY;  Surgeon: Darron Deatrice LABOR, MD;  Location: [facility];  Service: Cardiovascular;  Laterality: N/A;   CARDIAC CATHETERIZATION  1996; ~ 2010   CAROTID ENDARTERECTOMY Right    CATARACT EXTRACTION, BILATERAL Bilateral    COLONOSCOPY WITH PROPOFOL  N/A 06/07/2020   Procedure: COLONOSCOPY WITH PROPOFOL ;  Surgeon: Maryruth Ole DASEN, MD;  Location: [facility] ENDOSCOPY;  Service: Endoscopy;  Laterality: N/A;   CORONARY ANGIOPLASTY     CORONARY ARTERY BYPASS GRAFT  1996   CABG X4    ESOPHAGOGASTRODUODENOSCOPY N/A 06/07/2020   Procedure: ESOPHAGOGASTRODUODENOSCOPY (EGD);  Surgeon: Maryruth Ole DASEN, MD;  Location: [facility] ENDOSCOPY;  Service: Endoscopy;  Laterality: N/A;   LEFT HEART CATH AND CORS/GRAFTS ANGIOGRAPHY N/A 01/11/2017   Procedure: LEFT HEART CATH AND CORS/GRAFTS ANGIOGRAPHY;  Surgeon: Cherrie Toribio SAUNDERS, MD;  Location: [facility];  Service: Cardiovascular;  Laterality: N/A;   MOHS SURGERY     PERIPHERAL VASCULAR ATHERECTOMY  06/07/2021   Procedure: PERIPHERAL VASCULAR ATHERECTOMY;  Surgeon: Darron Deatrice LABOR, MD;  Location: [facility];  Service: Cardiovascular;;   PERIPHERAL VASCULAR CATHETERIZATION N/A 12/15/2014   Procedure: Abdominal Aortogram;  Surgeon: Deatrice LABOR Darron, MD;  Location: [facility];  Service: Cardiovascular;  Laterality: N/A;   REFRACTIVE SURGERY Right    before cataract OR   Active Ambulatory Problems    Diagnosis Date Noted   Type 2 diabetes mellitus with unspecified complications (HCC) 10/01/2008   Hyperlipidemia LDL goal <70 06/09/2008   ERECTILE DYSFUNCTION 06/09/2008   Essential hypertension 06/09/2008   Coronary atherosclerosis of native coronary artery 06/09/2008  Carotid stenosis 06/09/2008   HYPOTENSION, ORTHOSTATIC 07/28/2008   Hypotension, unspecified 06/09/2008   DIZZINESS 05/18/2009   Shortness of breath 11/25/2008   MYOCARDIAL PERFUSION SCAN, WITH STRESS TEST, ABNORMAL 12/20/2008   Syncope 07/25/2010   Leukocytosis 07/25/2010   Precordial pain 01/05/2011   Cough 12/16/2011   Malaise and fatigue 06/16/2012   PAD (peripheral artery disease) 12/13/2014   Peripheral vascular angioplasty status    Contusion of foot 07/31/2018   OSA (obstructive sleep apnea) 05/30/2021   Anatomical narrow angle borderline glaucoma 06/29/2021   Cataract extraction status, unspecified eye 06/29/2021   Contusion of right foot 06/29/2021   Fracture of one rib, right side, initial encounter for closed fracture  06/29/2021   Gastro-esophageal reflux disease without esophagitis 10/03/2013   Insomnia 06/29/2021   Insulin pump status 06/29/2021   Lens replaced by other means 06/29/2021   Lacunar infarction (HCC) 12/05/2017   Spondylosis without myelopathy or radiculopathy, lumbar region 06/29/2021   Myofascial pain 11/02/2020   Onychomycosis 06/29/2021   Obesity 06/29/2021   Low back pain potentially associated with radiculopathy 06/29/2021   Plantar fascial fibromatosis 06/29/2021   Sacroiliitis 11/02/2020   Sciatica 06/29/2021   Senile nuclear sclerosis 06/29/2021   Spinal stenosis of lumbar region 06/29/2021   Squamous cell carcinoma of skin 06/29/2021   Transient cerebral ischemic attack, unspecified 06/29/2021   Cardiac arrhythmia, unspecified 01/01/2022   Loud snoring 02/04/2023   History of sleep apnea 02/04/2023   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) 02/04/2023   Asthma 12/30/2023   Carcinoma of prostate (HCC) 12/30/2023   Malignant neoplasm of prostate (HCC) 12/30/2023   Adult bronchiectasis (HCC) 12/30/2023   Chronic obstructive pulmonary disease, unspecified (HCC) 12/30/2023   Corns and callosities 12/30/2023   Epistaxis 12/30/2023   Gross hematuria 12/30/2023   Heartburn 12/30/2023   Interstitial lung disease (HCC) 12/30/2023   Male erectile disorder (CODE) 12/30/2023   Nausea 12/30/2023   Open wound of left great toe 12/30/2023   Other abnormalities of gait and mobility 12/30/2023   Encounter for adjustment and management of vascular access device 12/30/2023   Encounter for fitting and adjustment of insulin pump 12/30/2023   Other specified counseling 12/30/2023   Pain in left knee 12/30/2023   Rising PSA following treatment for malignant neoplasm of prostate 12/30/2023   Scl-70 antibody positive 05/03/2022   Type 2 diabetes mellitus with diabetic neuropathy, unspecified (HCC) 12/30/2023   Type 2 diabetes mellitus with moderate nonproliferative diabetic retinopathy with  macular edema, left eye (HCC) 12/30/2023   Thoracic aortic atherosclerosis 08/04/2014   Personal history of diabetic foot ulcer 12/30/2023   Tinea unguium 12/30/2023   PVD (peripheral vascular disease) 02/03/2015   Chronic pain syndrome 12/30/2023   Pharmacologic therapy 12/30/2023   Disorder of skeletal system 12/30/2023   Problems influencing health status 12/30/2023   Abnormal MRI, lumbar spine (03/30/2010) 12/30/2023   History of prostate cancer 12/30/2023   Compression fracture of L2, sequela 12/30/2023   Chronic low back pain (1ry area of Pain) (Bilateral) w/o sciatica 12/30/2023   Low back pain of over 3 months duration 12/30/2023   Multifactorial low back pain 12/30/2023   Intermittent low back pain 12/30/2023   Vertebrogenic low back pain 12/30/2023   Resolved Ambulatory Problems    Diagnosis Date Noted   Dystrophic nail 02/15/2022   Past Medical History:  Diagnosis Date   CAD, ARTERY BYPASS GRAFT    Carotid arterial disease    Diastolic dysfunction    Dyslipidemia  Erectile dysfunction    GERD (gastroesophageal reflux disease)    Heart attack (HCC) 1996   HYPERLIPIDEMIA-MIXED 06/09/2008   Hypertension    HYPERTENSION, UNSPECIFIED 06/09/2008   Palpitations    Prostate cancer (HCC)    Transient ischemic attack (TIA)    Type I diabetes mellitus (HCC) dx'd 08/20/1966   Viral gastroenteritis    Weakness    Constitutional Exam  General appearance: Well nourished, well developed, and well hydrated. In no apparent acute distress Vitals:   12/30/23 1038  BP: 139/64  Pulse: 70  Resp: 18  Temp: (!) 97.3 F (36.3 C)  TempSrc: Temporal  SpO2: 97%  Weight: 249 lb (112.9 kg)  Height: 6' (1.829 m)   BMI Assessment: Estimated body mass index is 33.77 kg/m as calculated from the following:   Height as of this encounter: 6' (1.829 m).   Weight as of this encounter: 249 lb (112.9 kg).  BMI interpretation table: BMI level Category Range association with higher  incidence of chronic pain  <18 kg/m2 Underweight   18.5-24.9 kg/m2 Ideal body weight   25-29.9 kg/m2 Overweight Increased incidence by 20%  30-34.9 kg/m2 Obese (Class I) Increased incidence by 68%  35-39.9 kg/m2 Severe obesity (Class II) Increased incidence by 136%  >40 kg/m2 Extreme obesity (Class III) Increased incidence by 254%   Patient's current BMI Ideal Body weight  Body mass index is 33.77 kg/m. Ideal body weight: 77.6 kg (171 lb 1.2 oz) Adjusted ideal body weight: 91.7 kg (202 lb 3.9 oz)   BMI Readings from Last 4 Encounters:  12/30/23 33.77 kg/m  09/27/23 32.75 kg/m  06/26/23 32.58 kg/m  02/04/23 32.41 kg/m   Wt Readings from Last 4 Encounters:  12/30/23 249 lb (112.9 kg)  09/27/23 241 lb 8 oz (109.5 kg)  06/26/23 240 lb 3.2 oz (109 kg)  02/04/23 239 lb (108.4 kg)    Psych/Mental status: Alert, oriented x 3 (person, place, & time)       Eyes: PERLA Respiratory: No evidence of acute respiratory distress  Assessment  Primary Diagnosis & Pertinent Problem List: The primary encounter diagnosis was Chronic low back pain (1ry area of Pain) (Bilateral) w/o sciatica. Diagnoses of Compression fracture of L2, sequela, Low back pain of over 3 months duration, Multifactorial low back pain, Intermittent low back pain, Vertebrogenic low back pain, Low back pain potentially associated with radiculopathy, Abnormal MRI, lumbar spine (03/30/2010), Chronic pain syndrome, Pharmacologic therapy, Disorder of skeletal system, Problems influencing health status, and History of prostate cancer were also pertinent to this visit.  Visit Diagnosis (New problems to examiner): 1. Chronic low back pain (1ry area of Pain) (Bilateral) w/o sciatica   2. Compression fracture of L2, sequela   3. Low back pain of over 3 months duration   4. Multifactorial low back pain   5. Intermittent low back pain   6. Vertebrogenic low back pain   7. Low back pain potentially associated with radiculopathy   8.  Abnormal MRI, lumbar spine (03/30/2010)   9. Chronic pain syndrome   10. Pharmacologic therapy   11. Disorder of skeletal system   12. Problems influencing health status   13. History of prostate cancer    Plan of Care (Initial workup plan)  Note: Derek Hogan was reminded that as per protocol, today's visit has been an evaluation only. We have not taken over the patient's controlled substance management.  Problem-specific plan: Assessment and Plan    Chronic low back pain with bilateral lumbar facet arthropathy  and prior interventions   He experiences chronic bilateral low back pain that has worsened over the past three years. Previous treatments included bilateral lumbar facet blocks, radiofrequency ablation on both sides, right-sided sacroiliac joint blocks, and lumbar epidural steroid injections. A bilateral lumbar spinal cord stimulator trial was ineffective. The pain is diffuse across the lower back without significant leg radiation. No recent lumbar spine imaging has been done. The differential diagnosis includes facet arthropathy and potential nerve involvement due to a previous L2 fracture. An MRI of the lumbar spine is ordered to assess the current status and guide further management. He is referred to physical therapy for potential benefit.  Sequela of wedge compression fracture of L2 vertebra with canal stenosis   He has an L2 wedge compression fracture with retropulsion into the canal, causing spinal stenosis and potential nerve swelling. There is significant vertebral height loss contributing to decreased nerve exit openings. Symptoms include lower back pain and potential nerve irritation. Surgical intervention may be necessary due to structural changes and canal narrowing. An MRI of the lumbar spine is ordered to evaluate the extent of canal stenosis and vertebral height loss.  History of prostate cancer, in remission, under surveillance   His prostate cancer is in remission and  under surveillance with regular PSA checks. Recent PSA levels are stable post-radiation therapy. No chemotherapy has been administered. He continues regular follow-up with his oncologist at the [facility] for prostate cancer surveillance.       Lab Orders         Compliance Drug Analysis, Ur         Comp. Metabolic Panel (12)         Magnesium         Vitamin B12         Sedimentation rate         25-Hydroxy vitamin D  Lcms D2+D3         C-reactive protein     Imaging Orders         DG Lumbar Spine Complete W/Bend         MR LUMBAR SPINE WO CONTRAST     Referral Orders         Ambulatory referral to Physical Therapy     Procedure Orders    No procedure(s) ordered today   Pharmacotherapy (current): Medications ordered:  No orders of the defined types were placed in this encounter.  Medications administered during this visit: Lonzo B. Wilkerson Tom had no medications administered during this visit.   Analgesic Pharmacotherapy:  Opioid Analgesics: For patients currently taking or requesting to take opioid analgesics, in accordance with [place]  Medical Board Guidelines, we will assess their risks and indications for the use of these substances. After completing our evaluation, we may offer recommendations, but we no longer take patients for medication management. The prescribing physician will ultimately decide, based on his/her training and level of comfort whether to adopt any of the recommendations, including whether or not to prescribe such medicines.  Membrane stabilizer: To be determined at a later time  Muscle relaxant: To be determined at a later time  NSAID: To be determined at a later time  Other analgesic(s): To be determined at a later time   Interventional management options: Derek Hogan was informed that there is no guarantee that he would be a candidate for interventional therapies. The decision will be based on the results of diagnostic studies, as well as Mr.  Hogan risk profile.  Procedure(s) under consideration:  Pending results of ordered studies     Interventional Therapies  Risk Factors  Considerations  Medical Comorbidities:  COPD  BA  Bronchiectasia  Carotid Stenosis  Hx. TIAs  CAD  Hx. CABG  T2IDDM w/ insulin Pump  HTN  GERD  OSA  Hx. Orthostatic Hypotension  Dizziness  Hx. Syncope  Insulin Pump in-situ  Hx. Prostate & Skin Cancer  PVD     Planned  Pending:      Under consideration:   Pending   Completed: (Analgesic benefit)1  None at this time   Therapeutic  Palliative (PRN) options:   None established   Completed by other providers:   Therapeutic bilateral L3-S1 Facet Blk x1 (05/12/2020) by Daphne Ozell Lighter, MD ([facility])  Diagnostic left L2-L5 Facet MBB x2 (05/26/2020, 06/02/2020) by Daphne Ozell Lighter, MD ([facility])  Therapeutic left L2-L5 Facet MB RFA x1 (06/23/2020) by Daphne Ozell Lighter, MD ([facility])  Diagnostic right L2-L5 Facet MBB x2 (08/25/2020, 09/08/2020) by Daphne Ozell Lighter, MD ([facility])  Therapeutic right L2-L5 Facet MB RFA x1 (09/22/2020) by Daphne Ozell Lighter, MD ([facility])  Diagnostic right SI joint block x2 (11/17/2020, 01/12/2021) by Daphne Ozell Lighter, MD ([facility])  Therapeutic L4-5 LESI x1 (04/20/2021) by Daphne Ozell Lighter, MD ([facility])  Diagnostic lumbar spinal cord stimulator trial (09/18/2021) by Aloha BROCKS.Claudene, MD ([facility])   1(Analgesic benefit): Expressed in percentage (%). (Local anesthetic[LA] +/- sedation  L.A.Local Anesthetic  Steroid benefit  Ongoing benefit)   Provider-requested follow-up: Return in about 2 weeks (around 01/13/2024) for ( ), Eval-day (M,W), (Face2F), 2nd Visit, to review of ordered test(s).  Future Appointments  Date Time Provider Department Center  01/14/2024  1:00 PM [facility]  1 [facility] None  01/14/2024  2:00 PM [facility]  [facility] None   I discussed the assessment and treatment plan with the  patient. The patient was provided an opportunity to ask questions and all were answered. The patient agreed with the plan and demonstrated an understanding of the instructions.  Patient advised to call back or seek an in-person evaluation if the symptoms or condition worsens.  Duration of encounter: 137 minutes.  Total time on encounter, as per AMA guidelines included both the face-to-face and non-face-to-face time personally spent by the physician and/or other qualified health care professional(s) on the day of the encounter (includes time in activities that require the physician or other qualified health care professional and does not include time in activities normally performed by clinical staff). Physician's time may include the following activities when performed: Preparing to see the patient (e.g., pre-charting review of records, searching for previously ordered imaging, lab work, and nerve conduction tests) Review of prior analgesic pharmacotherapies. Reviewing PMP Interpreting ordered tests (e.g., lab work, imaging, nerve conduction tests) Performing post-procedure evaluations, including interpretation of diagnostic procedures Obtaining and/or reviewing separately obtained history Performing a medically appropriate examination and/or evaluation Counseling and educating the patient/family/caregiver Ordering medications, tests, or procedures Referring and communicating with other health care professionals (when not separately reported) Documenting clinical information in the electronic or other health record Independently interpreting results (not separately reported) and communicating results to the patient/ family/caregiver Care coordination (not separately reported)  Note by: Derek DELENA Como, MD (TTS and AI technology used. I apologize for any typographical errors that were not detected and corrected.) Date: 12/30/2023; Time: 11:44 AM

## 2023-12-30 ENCOUNTER — Ambulatory Visit
Admission: RE | Admit: 2023-12-30 | Discharge: 2023-12-30 | Disposition: A | Discharge status: Home / Self Care | Source: Ambulatory Visit | Attending: Pain Medicine | Admitting: Pain Medicine

## 2023-12-30 ENCOUNTER — Encounter: Payer: Self-pay | Admitting: Pain Medicine

## 2023-12-30 ENCOUNTER — Ambulatory Visit (HOSPITAL_BASED_OUTPATIENT_CLINIC_OR_DEPARTMENT_OTHER): Admitting: Pain Medicine

## 2023-12-30 VITALS — BP 139/64 | HR 70 | Temp 97.3°F | Resp 18 | Ht 72.0 in | Wt 249.0 lb

## 2023-12-30 DIAGNOSIS — Z79899 Other long term (current) drug therapy: Secondary | ICD-10-CM | POA: Insufficient documentation

## 2023-12-30 DIAGNOSIS — S32020S Wedge compression fracture of second lumbar vertebra, sequela: Secondary | ICD-10-CM

## 2023-12-30 DIAGNOSIS — G894 Chronic pain syndrome: Secondary | ICD-10-CM | POA: Diagnosis present

## 2023-12-30 DIAGNOSIS — R937 Abnormal findings on diagnostic imaging of other parts of musculoskeletal system: Secondary | ICD-10-CM | POA: Diagnosis present

## 2023-12-30 DIAGNOSIS — Z4681 Encounter for fitting and adjustment of insulin pump: Secondary | ICD-10-CM | POA: Insufficient documentation

## 2023-12-30 DIAGNOSIS — G8929 Other chronic pain: Secondary | ICD-10-CM | POA: Insufficient documentation

## 2023-12-30 DIAGNOSIS — M899 Disorder of bone, unspecified: Secondary | ICD-10-CM | POA: Diagnosis present

## 2023-12-30 DIAGNOSIS — Z789 Other specified health status: Secondary | ICD-10-CM | POA: Diagnosis present

## 2023-12-30 DIAGNOSIS — R110 Nausea: Secondary | ICD-10-CM | POA: Insufficient documentation

## 2023-12-30 DIAGNOSIS — Z8546 Personal history of malignant neoplasm of prostate: Secondary | ICD-10-CM | POA: Insufficient documentation

## 2023-12-30 DIAGNOSIS — R310 Gross hematuria: Secondary | ICD-10-CM | POA: Insufficient documentation

## 2023-12-30 DIAGNOSIS — M545 Low back pain, unspecified: Secondary | ICD-10-CM

## 2023-12-30 DIAGNOSIS — Z452 Encounter for adjustment and management of vascular access device: Secondary | ICD-10-CM | POA: Insufficient documentation

## 2023-12-30 DIAGNOSIS — B351 Tinea unguium: Secondary | ICD-10-CM | POA: Insufficient documentation

## 2023-12-30 DIAGNOSIS — J449 Chronic obstructive pulmonary disease, unspecified: Secondary | ICD-10-CM | POA: Insufficient documentation

## 2023-12-30 DIAGNOSIS — M5451 Vertebrogenic low back pain: Secondary | ICD-10-CM | POA: Insufficient documentation

## 2023-12-30 DIAGNOSIS — R040 Epistaxis: Secondary | ICD-10-CM | POA: Insufficient documentation

## 2023-12-30 DIAGNOSIS — E113312 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, left eye: Secondary | ICD-10-CM | POA: Insufficient documentation

## 2023-12-30 DIAGNOSIS — F5221 Male erectile disorder: Secondary | ICD-10-CM | POA: Insufficient documentation

## 2023-12-30 DIAGNOSIS — S91102A Unspecified open wound of left great toe without damage to nail, initial encounter: Secondary | ICD-10-CM | POA: Insufficient documentation

## 2023-12-30 DIAGNOSIS — R9721 Rising PSA following treatment for malignant neoplasm of prostate: Secondary | ICD-10-CM | POA: Insufficient documentation

## 2023-12-30 DIAGNOSIS — Z7189 Other specified counseling: Secondary | ICD-10-CM | POA: Insufficient documentation

## 2023-12-30 DIAGNOSIS — J45909 Unspecified asthma, uncomplicated: Secondary | ICD-10-CM | POA: Insufficient documentation

## 2023-12-30 DIAGNOSIS — M25562 Pain in left knee: Secondary | ICD-10-CM | POA: Insufficient documentation

## 2023-12-30 DIAGNOSIS — L84 Corns and callosities: Secondary | ICD-10-CM | POA: Insufficient documentation

## 2023-12-30 DIAGNOSIS — Z8631 Personal history of diabetic foot ulcer: Secondary | ICD-10-CM | POA: Insufficient documentation

## 2023-12-30 DIAGNOSIS — J479 Bronchiectasis, uncomplicated: Secondary | ICD-10-CM | POA: Insufficient documentation

## 2023-12-30 DIAGNOSIS — R2689 Other abnormalities of gait and mobility: Secondary | ICD-10-CM | POA: Insufficient documentation

## 2023-12-30 DIAGNOSIS — C61 Malignant neoplasm of prostate: Secondary | ICD-10-CM | POA: Insufficient documentation

## 2023-12-30 DIAGNOSIS — J849 Interstitial pulmonary disease, unspecified: Secondary | ICD-10-CM | POA: Insufficient documentation

## 2023-12-30 DIAGNOSIS — R12 Heartburn: Secondary | ICD-10-CM | POA: Insufficient documentation

## 2023-12-30 DIAGNOSIS — E1140 Type 2 diabetes mellitus with diabetic neuropathy, unspecified: Secondary | ICD-10-CM | POA: Insufficient documentation

## 2023-12-30 NOTE — Patient Instructions (Signed)

## 2023-12-30 NOTE — Progress Notes (Signed)
 Safety precautions to be maintained throughout the outpatient stay will include: orient to surroundings, keep bed in low position, maintain call bell within reach at all times, provide assistance with transfer out of bed and ambulation.

## 2023-12-31 NOTE — Telephone Encounter (Signed)
Ok, thanks for the info

## 2024-01-05 ENCOUNTER — Encounter: Payer: Self-pay | Admitting: Cardiovascular Disease

## 2024-01-06 ENCOUNTER — Encounter: Payer: Self-pay | Admitting: Pain Medicine

## 2024-01-06 DIAGNOSIS — M4316 Spondylolisthesis, lumbar region: Secondary | ICD-10-CM | POA: Insufficient documentation

## 2024-01-06 LAB — COMPLIANCE DRUG ANALYSIS, UR

## 2024-01-07 LAB — C-REACTIVE PROTEIN: CRP: <1 mg/L (ref 0–10)

## 2024-01-07 LAB — 25-HYDROXY VITAMIN D LCMS D2+D3
25-Hydroxy, Vitamin D-2: <1 ng/mL
25-Hydroxy, Vitamin D-3: 27 ng/mL
25-Hydroxy, Vitamin D: 27 ng/mL — ABNORMAL LOW

## 2024-01-07 LAB — COMP. METABOLIC PANEL (12)
AST: 30 IU/L (ref 0–40)
Albumin: 4.1 g/dL (ref 3.8–4.8)
Alkaline Phosphatase: 71 IU/L (ref 47–123)
BUN/Creatinine Ratio: 18 (ref 10–24)
BUN: 17 mg/dL (ref 8–27)
Bilirubin Total: 0.4 mg/dL (ref 0.0–1.2)
Calcium: 8.9 mg/dL (ref 8.6–10.2)
Chloride: 100 mmol/L (ref 96–106)
Creatinine, Ser: 0.96 mg/dL (ref 0.76–1.27)
Globulin, Total: 2.4 g/dL (ref 1.5–4.5)
Glucose: 205 mg/dL — ABNORMAL HIGH (ref 70–99)
Potassium: 4.7 mmol/L (ref 3.5–5.2)
Sodium: 137 mmol/L (ref 134–144)
Total Protein: 6.5 g/dL (ref 6.0–8.5)
eGFR: 80 mL/min/1.73 (ref >59)

## 2024-01-07 LAB — VITAMIN B12: Vitamin B-12: 748 pg/mL (ref 232–1245)

## 2024-01-07 LAB — SEDIMENTATION RATE: Sed Rate: 18 mm/h (ref 0–30)

## 2024-01-07 LAB — MAGNESIUM: Magnesium: 1.8 mg/dL (ref 1.6–2.3)

## 2024-01-08 ENCOUNTER — Ambulatory Visit: Attending: Nurse Practitioner | Admitting: Nurse Practitioner

## 2024-01-08 ENCOUNTER — Encounter: Payer: Self-pay | Admitting: Nurse Practitioner

## 2024-01-08 ENCOUNTER — Ambulatory Visit: Admission: RE | Admit: 2024-01-08 | Discharge: 2024-01-08 | Attending: Pain Medicine | Admitting: Pain Medicine

## 2024-01-08 VITALS — BP 136/60 | HR 65 | Ht 72.0 in | Wt 246.0 lb

## 2024-01-08 DIAGNOSIS — I10 Essential (primary) hypertension: Secondary | ICD-10-CM | POA: Insufficient documentation

## 2024-01-08 DIAGNOSIS — Z794 Long term (current) use of insulin: Secondary | ICD-10-CM | POA: Diagnosis present

## 2024-01-08 DIAGNOSIS — I2510 Atherosclerotic heart disease of native coronary artery without angina pectoris: Secondary | ICD-10-CM | POA: Diagnosis not present

## 2024-01-08 DIAGNOSIS — I779 Disorder of arteries and arterioles, unspecified: Secondary | ICD-10-CM | POA: Diagnosis present

## 2024-01-08 DIAGNOSIS — I739 Peripheral vascular disease, unspecified: Secondary | ICD-10-CM | POA: Insufficient documentation

## 2024-01-08 DIAGNOSIS — E785 Hyperlipidemia, unspecified: Secondary | ICD-10-CM | POA: Diagnosis present

## 2024-01-08 DIAGNOSIS — S32020S Wedge compression fracture of second lumbar vertebra, sequela: Secondary | ICD-10-CM | POA: Insufficient documentation

## 2024-01-08 DIAGNOSIS — M545 Low back pain, unspecified: Secondary | ICD-10-CM | POA: Diagnosis present

## 2024-01-08 DIAGNOSIS — I5032 Chronic diastolic (congestive) heart failure: Secondary | ICD-10-CM | POA: Insufficient documentation

## 2024-01-08 DIAGNOSIS — R937 Abnormal findings on diagnostic imaging of other parts of musculoskeletal system: Secondary | ICD-10-CM | POA: Diagnosis present

## 2024-01-08 DIAGNOSIS — E119 Type 2 diabetes mellitus without complications: Secondary | ICD-10-CM | POA: Diagnosis present

## 2024-01-08 DIAGNOSIS — M5451 Vertebrogenic low back pain: Secondary | ICD-10-CM | POA: Insufficient documentation

## 2024-01-08 DIAGNOSIS — R0602 Shortness of breath: Secondary | ICD-10-CM | POA: Diagnosis present

## 2024-01-08 DIAGNOSIS — G8929 Other chronic pain: Secondary | ICD-10-CM | POA: Diagnosis present

## 2024-01-08 NOTE — Progress Notes (Signed)
 Office Visit    Patient Name: Derek Hogan Date of Encounter: 01/08/2024  Primary Care Provider:  [name] ORN, MD Primary Cardiologist:  [name], MD  Cardiology APP:  [name], NP  Advanced Heart Failure:  [name], MD   Chief Complaint    78 y.o. male with above past medical history including CAD, carotid arterial disease, hypertension, hyperlipidemia, chronic HFpEF, lower extremity peripheral arterial disease, diabetes, possible TIA in 2016, right lower extremity venous insufficiency, lumbar spinal stenosis with radiculopathy, and obesity, who presents for f/u related to dyspnea.  Past Medical History   Subjective   Past Medical History:  Diagnosis Date   CAD, ARTERY BYPASS GRAFT    a. 1996 s/p CABG x 4 (LIMA->LAD, VG->D1, VG->OM3, VG->RPDA; b. 12/2016 [facility]): EF 47%, anteroapical and inf infarcts w/ anteroapical peri-infarct ischemia; c. 01/2017: Cath: LM 83m/d, LAD 100p/m, D1 90ost, 70p, LCX 80ost/p, 166m, RCA 38m, 75d, RPDA 95ost, VG->RPDA 30p/m, VG->OM3 nl, LIMA->LAD nl, VG->D1 nl; d. 06/2019 [facility]: EF 52%, basal inf/mid ant/mid inf/apical ant/apical infarct w/ ischemia.   Carotid arterial disease    a. s/p remote R CEA (Dr. Cathlyn Hint); b. 04/2021 Carotid U/S: 1-30% bilat ICA stenoses.   Diastolic dysfunction    a. 05/2021 Echo: EF 60-65%, no rwma, GrI DD, nl RV fxn; b. 06/2023 Echo: EF of 55-60% without regional wall motion abnormalities, mild LVH, grade 2 diastolic dysfunction, normal RV function, mild left atrial enlargement, and mild MR/AI.   Dyslipidemia    Erectile dysfunction    GERD (gastroesophageal reflux disease)    Heart attack (HCC) 1996   HYPERLIPIDEMIA-MIXED 06/09/2008   Hypertension    HYPERTENSION, UNSPECIFIED 06/09/2008   Orthostatic hypotension    Palpitations    a. 04/2021 Zio: Predominantly RSR @ 77 (60-144), 5 SVT runs - fastest 144, longest 20 beats. Occas PVCs - 5%.   Prostate cancer (HCC)    PVD  (peripheral vascular disease)    a. 02/2014 s/p DBA/stent to prox R popliteal; b. 06/2021 orbital atherectomy and DCBA to L popliteal (90%). Otw moderate, nonobs PAD; c. 12/2022 ABIs/Duplex: patent L popliteal.   Transient ischemic attack (TIA)    Type I diabetes mellitus (HCC) dx'd 08/20/1966   Viral gastroenteritis    04/19/2010 improved   Weakness    Past Surgical History:  Procedure Laterality Date   ABDOMINAL AORTOGRAM W/LOWER EXTREMITY N/A 06/07/2021   Procedure: ABDOMINAL AORTOGRAM W/LOWER EXTREMITY;  Surgeon: Cage Deatrice LABOR, MD;  [facility];  Service: Cardiovascular;  Laterality: N/A;   CARDIAC CATHETERIZATION  1996; ~ 2010   CAROTID ENDARTERECTOMY Right    CATARACT EXTRACTION, BILATERAL Bilateral    COLONOSCOPY WITH PROPOFOL  N/A 06/07/2020   Procedure: COLONOSCOPY WITH PROPOFOL ;  Surgeon: Maryruth Ole DASEN, MD;  Location: [facility] ENDOSCOPY;  Service: Endoscopy;  Laterality: N/A;   CORONARY ANGIOPLASTY     CORONARY ARTERY BYPASS GRAFT  1996   CABG X4   ESOPHAGOGASTRODUODENOSCOPY N/A 06/07/2020   Procedure: ESOPHAGOGASTRODUODENOSCOPY (EGD);  Surgeon: Maryruth Ole DASEN, MD;  Location: [facility] ENDOSCOPY;  Service: Endoscopy;  Laterality: N/A;   LEFT HEART CATH AND CORS/GRAFTS ANGIOGRAPHY N/A 01/11/2017   Procedure: LEFT HEART CATH AND CORS/GRAFTS ANGIOGRAPHY;  Surgeon: Fuel Toribio SAUNDERS, MD;  Location: [facility];  Service: Cardiovascular;  Laterality: N/A;   MOHS SURGERY     PERIPHERAL VASCULAR ATHERECTOMY  06/07/2021   Procedure: PERIPHERAL VASCULAR ATHERECTOMY;  Surgeon: Cage Deatrice LABOR, MD;  Location: [facility] INVASIVE CV  LAB;  Service: Cardiovascular;;   PERIPHERAL VASCULAR CATHETERIZATION N/A 12/15/2014   Procedure: Abdominal Aortogram;  Surgeon: Deatrice DELENA Cage, MD;  Location: [facility];  Service: Cardiovascular;  Laterality: N/A;   REFRACTIVE SURGERY Right    before cataract OR    Allergies  Allergies  Allergen Reactions   Simvastatin  Swelling   Lisinopril  Other (See Comments)    Unknown       History of Present Illness      78 y.o. y/o male with above past medical history including CAD, carotid arterial disease, hypertension, hyperlipidemia, lower extremity peripheral arterial disease, chronic HFpEF, diabetes, possible TIA in 2016, right lower extremity venous insufficiency, lumbar spinal stenosis with radiculopathy, and obesity.  He previously underwent CABG x4 in 1996.  He quit smoking at the time of his bypass surgery.  He previously underwent right carotid endarterectomy and has been followed with serial ultrasounds over the years (March 2023 1 to 39% bilateral ICA stenoses).  In the setting of lower extremity arterial disease, he underwent drug-coated balloon angioplasty and stenting of the proximal right popliteal in January 2016.  Most recent diagnostic catheterization was performed December 2018 revealing 4 of 4 patent grafts with severe native multivessel disease, and he was medically managed.  Stress testing in 2021 was low risk with prior infarct but no ischemia.   In the setting of left foot pain and small ulceration of the left big toe April 2023, he underwent angiography in May 2023 revealing a heavily calcified 90% stenosis within the left above-the-knee popliteal artery and otherwise moderate, nonobstructive disease bilaterally.  Popliteal artery was treated with orbital atherectomy and drug-coated balloon angioplasty.  In 2023, he was evaluated at the [facility] with brain MRI due to dizziness.  This showed no acute infarct.  He was noted to have chronic right thalamus and left cerebellar lacunar infarcts in addition to microvascular ischemic disease with scattered cerebral microhemorrhages suggestive of amyloid angiopathy.  CTA of the head was performed at the [facility] in the setting of ongoing dizziness which showed significant stenosis in the right vertebral artery but the left vertebral artery was dominant with no obstructive  disease and he has been conservatively managed.  In May 2025, he was evaluated due to complaints of dyspnea and fatigue.  An echocardiogram was performed and showed an EF of 55-60% without regional wall motion abnormalities, mild LVH, grade 2 diastolic dysfunction, normal RV function, mild left atrial enlargement, and mild MR/AI.  Subsequent carotid Doppler showed mild nonobstructive disease bilaterally.  In June 2025, he was seen in the emergency department due to loss of consciousness in the setting of hypoglycemia.  CT of the head was unremarkable.  His insulin pump was adjusted.     Mr. Bruhl was last seen in cardiology clinic in August 2025, at which time he was doing reasonably well. He recently underwent imaging of his spine due to chronic low back pain.  Heavy calcification of the aorta and iliac arteries was noted and cause concern for him, prompting visit today.  In addition to his imaging concerns, he notes that over the past several months, that he has had a decline in activity tolerance.  He continues to go to the gym 3 to 4 days a week and will ride a recumbent bike for 40 minutes without chest pain or dyspnea.  He notes that his heart rate usually maxes out in the 80s to low 90s.  When he is at home and needs to walk  up steps or carry something for any distance, he notes significant dyspnea on exertion, which has progressed.  He does not experience chest pain.  He is concerned that symptoms are similar to what he experienced prior to prior CABG.  He denies palpitations, PND, orthopnea, dizziness, syncope, edema, or early satiety.  Objective   Home Medications    Current Outpatient Medications  Medication Sig Dispense Refill   amLODipine (NORVASC) 10 MG tablet Take 10 mg by mouth daily.     aspirin  81 MG tablet Take 81 mg by mouth at bedtime.     atorvastatin  (LIPITOR ) 80 MG tablet Take 1 tablet (80 mg total) by mouth daily.     bisacodyl (DULCOLAX) 5 MG EC tablet Take 5 mg by mouth  daily as needed.     Calcium  Carb-Cholecalciferol 600-10 MG-MCG TABS Take 2 tablets by mouth daily.     Calcium -Magnesium-Zinc (CAL-MAG-ZINC PO) Take 1 tablet by mouth daily.     carvedilol  (COREG ) 6.25 MG tablet Take 1 tablet (6.25 mg total) by mouth 2 (two) times daily. 180 tablet 3   ezetimibe (ZETIA) 10 MG tablet Take 10 mg by mouth daily.     fluticasone -salmeterol (WIXELA INHUB) 250-50 MCG/ACT AEPB Inhale 1 puff into the lungs in the morning and at bedtime. 60 each 2   glucagon 1 MG injection 1 mg once as needed (low blood glucose).     insulin aspart (NOVOLOG) 100 unit/mL injection Inject into the skin continuous. INSULIN PUMP     nitroGLYCERIN  (NITROSTAT ) 0.4 MG SL tablet Place 0.4 mg under the tongue every 5 (five) minutes as needed for chest pain.      omeprazole (PRILOSEC) 20 MG capsule Take 20 mg by mouth every morning.      sertraline (ZOLOFT) 100 MG tablet Take 100 mg by mouth daily.     tamsulosin (FLOMAX) 0.4 MG CAPS capsule Take 0.4 mg by mouth.     Tiotropium Bromide-Olodaterol 2.5-2.5 MCG/ACT AERS Inhale into the lungs.     torsemide  (DEMADEX ) 20 MG tablet Take 20 mg by mouth every other day.     traZODone (DESYREL) 50 MG tablet Take 25 mg by mouth at bedtime.     No current facility-administered medications for this visit.     Physical Exam    VS:  BP 136/60 (BP Location: Left Arm, Patient Position: Sitting, Cuff Size: Normal)   Pulse 65   Ht 6' (1.829 m)   Wt 246 lb (111.6 kg)   SpO2 97%   BMI 33.36 kg/m  , BMI Body mass index is 33.36 kg/m.          GEN: Well nourished, well developed, in no acute distress. HEENT: normal. Neck: Supple, no JVD, bilateral carotid bruits.  No masses. Cardiac: RRR, no murmurs, rubs, or gallops. No clubbing, cyanosis, trace right ankle edema.  Radials 2+/PT 1+ and equal bilaterally.  Respiratory:  Respirations regular and unlabored, clear to auscultation bilaterally. GI: Soft, nontender, nondistended, BS + x 4. MS: no deformity  or atrophy. Skin: warm and dry, no rash. Neuro:  Strength and sensation are intact. Psych: Normal affect.  Accessory Clinical Findings    ECG personally reviewed by me today - EKG Interpretation Date/Time:  Wednesday January 08 2024 14:55:46 EST Ventricular Rate:  65 PR Interval:  164 QRS Duration:  108 QT Interval:  384 QTC Calculation: 399 R Axis:   -13  Text Interpretation: Normal sinus rhythm Septal infarct Cannot rule out Inferior infarct Confirmed by Vivienne Bruckner (  [id number]) on 01/08/2024 3:11:07 PM  - no acute changes.  Lab Results  Component Value Date   WBC 10.6 (H) 02/01/2022   HGB 12.2 (L) 02/01/2022   HCT 37.5 (L) 02/01/2022   MCV 92.8 02/01/2022   PLT 232 02/01/2022   Lab Results  Component Value Date   CREATININE 0.96 12/30/2023   BUN 17 12/30/2023   NA 137 12/30/2023   K 4.7 12/30/2023   CL 100 12/30/2023   CO2 24 06/29/2021   Lab Results  Component Value Date   ALT 23 03/16/2015   AST 30 12/30/2023   ALKPHOS 71 12/30/2023   BILITOT 0.4 12/30/2023   Lab Results  Component Value Date   CHOL 116 07/08/2014   HDL 43 07/08/2014   LDLCALC 63 07/08/2014   TRIG 51 07/08/2014   CHOLHDL 2.7 07/08/2014    Lab Results  Component Value Date   HGBA1C 7.8 (H) 10/09/2012   Lab Results  Component Value Date   TSH 1.820 06/28/2023       Assessment & Plan    1.  Dyspnea on exertion/coronary artery disease: Status post CABG x 4 in 1996 with diagnostic catheterization 2018 showing 4 4 patent grafts with severe multivessel native disease.  Stress testing in 2021 was low risk with basal inferior, mid anterior, mid inferior, apical anterior, and apical infarct without ischemia.  Though he goes to the gym several times a week and uses a stationary bike without symptoms limitations, he has noted a decline in activity tolerance with chores around his home such as walking up steps or carrying items.  He notes that when he is at the gym, his maximal heart rate is  only in the 80s to low 90s.  He is suspicious that progressive dyspnea is similar to what he experienced prior to his CABG.  Echocardiogram earlier this year showed normal LV function with grade 2 diastolic dysfunction and mild MR/AI.  We agreed to pursue a Lexiscan  PET stress test to evaluate for ischemia.  He remains on aspirin , statin, beta-blocker, Zetia, and amlodipine therapy.  2.  Primary hypertension: Blood pressure stable today at 136/60.  Continue beta-blocker, calcium  channel blocker, and diuretic.  3.  Hyperlipidemia: Followed at the [facility].  He is on Zetia and atorvastatin .  4.  Chronic HFpEF: As above, echo earlier this year showed normal LV function with grade 2 diastolic dysfunction and mild MR/AI.  Heart rate and blood pressure relatively stable.  He has trace ankle edema, which he notes is being stable.  His weight is up over the past year but similar to what was seen in 2023.  He does not appear to be significantly volume overloaded on examination.  Continue beta-blocker, calcium  channel blocker,, and torsemide , which he uses every other day (20 mg).  5.  Peripheral arterial disease: Status post prior bilateral interventions with stable ABIs/duplex last year.  He is overdue for repeat and scheduled for December 9.  He denies claudication remains on aspirin , statin, and Zetia.  6.  Carotid arterial disease: Carotid ultrasound in June once stable, 1 to 39% bilateral internal carotid artery stenoses.  Continue aspirin  and statin therapy.  7.  Type 2 diabetes mellitus: Followed at the [facility].  Remains on NovoLog via insulin pump.  8.  Disposition: Follow-up PET stress test.  Follow-up in clinic in 6 weeks or sooner if necessary.  Lonni Meager, NP 01/08/2024, 3:12 PM

## 2024-01-08 NOTE — Patient Instructions (Signed)
 Medication Instructions:  No changes *If you need a refill on your cardiac medications before your next appointment, please call your pharmacy*  Lab Work: None ordered If you have labs (blood work) drawn today and your tests are completely normal, you will receive your results only by: MyChart Message (if you have MyChart) OR A paper copy in the mail If you have any lab test that is abnormal or we need to change your treatment, we will call you to review the results.  Testing/Procedures: CARDIAC PET- Your physician has requested that you have a Cardiac Pet Stress Test.   This testing is completed at [facility] ([address] [place], [place] [address]) or [facility] ([address], [place], [place]). Please arrive 30 minutes prior to your scheduled time.  The schedulers will call you to get this scheduled. Please follow further testing instructions below.   Follow-Up: At [facility], you and your health needs are our priority.  As part of our continuing mission to provide you with exceptional heart care, our providers are all part of one team.  This team includes your primary Cardiologist (physician) and Advanced Practice Providers or APPs (Physician Assistants and Nurse Practitioners) who all work together to provide you with the care you need, when you need it.  Your next appointment:   6 week(s)  Provider:   Lonni Meager, NP  We recommend signing up for the patient portal called MyChart.  Sign up information is provided on this After Visit Summary.  MyChart is used to connect with patients for Virtual Visits (Telemedicine).  Patients are able to view lab/test results, encounter notes, upcoming appointments, etc.  Non-urgent messages can be sent to your provider as well.   To learn more about what you can do with MyChart, go to [email or web address].   Other Instructions    Please report to Radiology at the  [facility] Main Entrance 30 minutes early for your test.  [address] [place], [place] [address]                         OR   Please report to Radiology at [facility] Main Entrance, medical mall, 30 mins prior to your test.  [address]  [place], [place]  How to Prepare for Your Cardiac PET/CT Stress Test:  Nothing to eat or drink, except water, 3 hours prior to arrival time.  NO caffeine/decaffeinated products, or chocolate 12 hours prior to arrival. (Please note decaffeinated beverages (teas/coffees) still contain caffeine).  If you have caffeine within 12 hours prior, the test will need to be rescheduled.  Medication instructions: Do not take erectile dysfunction medications for 72 hours prior to test (sildenafil, tadalafil) Do not take nitrates (isosorbide mononitrate, Ranexa) the day before or day of test Do not take tamsulosin the day before or morning of test   Diabetic Preparation: If able to eat breakfast prior to 3 hour fasting, you may take all medications, including your insulin. Do not worry if you miss your breakfast dose of insulin - start at your next meal. If you do not eat prior to 3 hour fast-Hold all diabetes (oral and insulin) medications. Patients who wear a continuous glucose monitor MUST remove the device prior to scanning.  You may take your remaining medications with water.  NO perfume, cologne or lotion on chest or abdomen area.  Total time is 1 to 2  hours; you may want to bring reading material for the waiting time.  IF YOU THINK YOU MAY BE PREGNANT, OR ARE NURSING PLEASE INFORM THE TECHNOLOGIST.  In preparation for your appointment, medication and supplies will be purchased.  Appointment availability is limited, so if you need to cancel or reschedule, please call the Radiology Department Scheduler at [phone] 24 hours in advance to avoid a cancellation fee of $100.00  What to Expect When you  Arrive:  Once you arrive and check in for your appointment, you will be taken to a preparation room within the Radiology Department.  A technologist or Nurse will obtain your medical history, verify that you are correctly prepped for the exam, and explain the procedure.  Afterwards, an IV will be started in your arm and electrodes will be placed on your skin for EKG monitoring during the stress portion of the exam. Then you will be escorted to the PET/CT scanner.  There, staff will get you positioned on the scanner and obtain a blood pressure and EKG.  During the exam, you will continue to be connected to the EKG and blood pressure machines.  A small, safe amount of a radioactive tracer will be injected in your IV to obtain a series of pictures of your heart along with an injection of a stress agent.    After your Exam:  It is recommended that you eat a meal and drink a caffeinated beverage to counter act any effects of the stress agent.  Drink plenty of fluids for the remainder of the day and urinate frequently for the first couple of hours after the exam.  Your doctor will inform you of your test results within 7-10 business days.  For more information and frequently asked questions, please visit our website: [email or web address]  For questions about your test or how to prepare for your test, please call: Cardiac Imaging Nurse Navigators Office: [phone]

## 2024-01-14 ENCOUNTER — Other Ambulatory Visit: Payer: Self-pay | Admitting: Cardiovascular Disease

## 2024-01-14 ENCOUNTER — Ambulatory Visit: Attending: Cardiovascular Disease

## 2024-01-14 ENCOUNTER — Ambulatory Visit

## 2024-01-14 DIAGNOSIS — I739 Peripheral vascular disease, unspecified: Secondary | ICD-10-CM

## 2024-01-14 LAB — VAS US ABI WITH/WO TBI

## 2024-01-17 ENCOUNTER — Ambulatory Visit: Payer: Self-pay | Admitting: Cardiovascular Disease

## 2024-01-17 DIAGNOSIS — I739 Peripheral vascular disease, unspecified: Secondary | ICD-10-CM

## 2024-01-19 NOTE — Progress Notes (Unsigned)
 PROVIDER NOTE: Interpretation of information contained herein should be left to medically-trained personnel. Specific patient instructions are provided elsewhere under Patient Instructions section of medical record. This document was created in part using AI and STT-dictation technology, any transcriptional errors that may result from this process are unintentional.  Patient: Derek Hogan  Service: E/M   PCP: [name] ORN, MD  DOB: 22-Dec-1945  DOS: 01/20/2024  Provider: Eric DELENA Como, MD  MRN: [id number]  Delivery: Face-to-face  Specialty: Interventional Pain Management  Type: Established Patient  Setting: Ambulatory outpatient facility  Specialty designation: 09  Referring Prov.: [name] ORN, MD  Location: Outpatient office facility       Primary Reason(s) for Visit: Encounter for evaluation before starting new chronic pain management plan of care (Level of risk: moderate) CC: No chief complaint on file.  HPI  Derek Hogan is a 78 y.o. year old, male patient, who comes today for a follow-up evaluation to review the test results and decide on a treatment plan. He has Type 2 diabetes mellitus with unspecified complications (HCC); Hyperlipidemia LDL goal <70; ERECTILE DYSFUNCTION; Essential hypertension; Coronary atherosclerosis of native coronary artery; Carotid stenosis; HYPOTENSION, ORTHOSTATIC; Hypotension, unspecified; DIZZINESS; Shortness of breath; MYOCARDIAL PERFUSION SCAN, WITH STRESS TEST, ABNORMAL; Syncope; Leukocytosis; Precordial pain; Cough; Malaise and fatigue; PAD (peripheral artery disease); Peripheral vascular angioplasty status; Contusion of foot; OSA (obstructive sleep apnea); Anatomical narrow angle borderline glaucoma; Cataract extraction status, unspecified eye; Contusion of right foot; Fracture of one rib, right side, initial encounter for closed fracture; Gastroesophageal reflux; Insomnia; Insulin pump status; Lens replaced by other means; Lacunar  infarction (HCC); Spondylosis without myelopathy or radiculopathy, lumbar region; Myofascial pain; Onychomycosis; Obesity; Low back pain potentially associated with radiculopathy; Plantar fascial fibromatosis; Sacroiliitis; Sciatica; Senile nuclear sclerosis; Spinal stenosis of lumbar region; Squamous cell carcinoma of skin; Transient cerebral ischemic attack, unspecified; Cardiac arrhythmia, unspecified; Loud snoring; History of sleep apnea; 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); Asthma; Carcinoma of prostate (HCC); Malignant neoplasm of prostate (HCC); Adult bronchiectasis (HCC); Chronic obstructive pulmonary disease, unspecified (HCC); Corns and callosities; Epistaxis; Gross hematuria; Heartburn; Interstitial lung disease (HCC); Male erectile disorder (CODE); Nausea; Open wound of left great toe; Other abnormalities of gait and mobility; Encounter for adjustment and management of vascular access device; Encounter for fitting and adjustment of insulin pump; Other specified counseling; Pain in left knee; Rising PSA following treatment for malignant neoplasm of prostate; Scl-70 antibody positive; Type 2 diabetes mellitus with diabetic neuropathy, unspecified (HCC); 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, left eye (HCC); Thoracic aortic atherosclerosis; Personal history of diabetic foot ulcer; Tinea unguium; PVD (peripheral vascular disease); Chronic pain syndrome; Pharmacologic therapy; Disorder of skeletal system; Problems influencing health status; Abnormal MRI, lumbar spine (01/13/2024); History of prostate cancer; Compression fracture of L2, sequela; Chronic low back pain (1ry area of Pain) (Bilateral) w/o sciatica; Low back pain of over 3 months duration; Multifactorial low back pain; Intermittent low back pain; Vertebrogenic low back pain; Spondylolisthesis of lumbar region with dynamic instability (L1-2, L2-3, L3-4); and Abnormal x-ray of lumbar spine  (01/04/2024) on their problem list. His primarily concern today is the No chief complaint on file.  Pain Assessment: Location:     Radiating:   Onset:   Duration:   Quality:   Severity:  /10 (subjective, self-reported pain score)  Effect on ADL:   Timing:   Modifying factors:   BP:    HR:    Derek Hogan comes in  today for a follow-up visit after his initial evaluation on 12/30/2023. Today we went over the results of his tests. These were explained in Layman's terms. During today's appointment we went over my diagnostic impression, as well as the proposed treatment plan.  Review of initial evaluation (12/30/2023): Derek Hogan is a 78 year old male who presents for an initial evaluation of worsening chronic low back pain.   He experiences bilateral low back pain that has worsened over the past three years, with no side being more affected than the other. The pain sometimes extends to the thigh, particularly after physical activities like yard work, and can persist for days. He has undergone multiple interventional procedures, including bilateral lumbar facet blocks, radiofrequency ablation, sacroiliac joint blocks, and lumbar epidural steroid injections at the L4/5 level, all providing minimal relief. A bilateral lumbar spinal cord stimulator trial also failed to provide relief. There is no current pain, numbness, or weakness radiating down the legs, and no nerve conduction studies have been performed. He recalls an L2 fracture at age 71, treated with a brace, but without significant pain at that time.  Review of diagnostic test ordered on 12/30/2023:  Diagnostic lab work: *** Diagnostic imaging: ***  Discussed the use of AI scribe software for clinical note transcription with the patient, who gave verbal consent to proceed.  History of Present Illness          Patient presented with interventional treatment options. Derek Hogan was informed that I will not be providing  medication management. Pharmacotherapy evaluation including recommendations may be offered, if specifically requested.   Controlled Substance Pharmacotherapy Assessment REMS (Risk Evaluation and Mitigation Strategy)  Opioid Analgesic: None MME/day: 0 mg/day   Pill Count: None expected due to no prior prescriptions written by our practice. No notes on file  Pharmacokinetics: Liberation and absorption (onset of action): WNL Distribution (time to peak effect): WNL Metabolism and excretion (duration of action): WNL         Pharmacodynamics: Desired effects: Analgesia: Derek Hogan reports >50% benefit. Functional ability: Patient reports that medication allows him to accomplish basic ADLs Clinically meaningful improvement in function (CMIF): Sustained CMIF goals met Perceived effectiveness: Described as relatively effective, allowing for increase in activities of daily living (ADL) Undesirable effects: Side-effects or Adverse reactions: None reported Monitoring: [place] PMP: PDMP reviewed during this encounter. Online review of the past 7-month period previously conducted. Not applicable at this point since we have not taken over the patient's medication management yet. List of other Serum/Urine Drug Screening Test(s):  No results found for: AMPHSCRSER, BARBSCRSER, BENZOSCRSER, COCAINSCRSER, COCAINSCRNUR, PCPSCRSER, THCSCRSER, THCU, CANNABQUANT, OPIATESCRSER, OXYSCRSER, PROPOXSCRSER, ETH, CBDTHCR, D8THCCBX, D9THCCBX List of all UDS test(s) done:  Lab Results  Component Value Date   SUMMARY FINAL 12/30/2023   Last UDS on record: Summary  Date Value Ref Range Status  12/30/2023 FINAL  Final    Comment:    ==================================================================== Compliance Drug Analysis, Ur ==================================================================== Test                             Result       Flag       Units  Drug Present and  Declared for Prescription Verification   Sertraline                     PRESENT      EXPECTED   Desmethylsertraline  PRESENT      EXPECTED    Desmethylsertraline is an expected metabolite of sertraline.    Trazodone                      PRESENT      EXPECTED  Drug Absent but Declared for Prescription Verification   Salicylate                     Not Detected UNEXPECTED    Aspirin , as indicated in the declared medication list, is not always    detected even when used as directed.  ==================================================================== Test                      Result    Flag   Units      Ref Range   Creatinine              27               mg/dL      >=79 ==================================================================== Declared Medications:  The flagging and interpretation on this report are based on the  following declared medications.  Unexpected results may arise from  inaccuracies in the declared medications.   **Note: The testing scope of this panel includes these medications:   Sertraline (Zoloft)  Trazodone (Desyrel)   **Note: The testing scope of this panel does not include small to  moderate amounts of these reported medications:   Aspirin    **Note: The testing scope of this panel does not include the  following reported medications:   Amlodipine (Norvasc)  Atorvastatin  (Lipitor )  Bisacodyl  Calcium   Carvedilol  (Coreg )  Cholecalciferol  Ezetimibe (Zetia)  Fluticasone  (Advair)  Glucagon  Insulin (NovoLog)  Magnesium  Nitroglycerin  (Nitrostat )  Olodaterol  Omeprazole (Prilosec)  Salmeterol (Advair)  Tamsulosin (Flomax)  Tiotropium  Torsemide  (Demadex )  Zinc ==================================================================== For clinical consultation, please call [phone]. ====================================================================    UDS interpretation: No unexpected findings.          Medication Assessment  Form: Not applicable. No opioids. Treatment compliance: Not applicable Risk Assessment Profile: Aberrant behavior: See initial evaluations. None observed or detected today Comorbid factors increasing risk of overdose: See initial evaluation. No additional risks detected today Opioid risk tool (ORT):     12/30/2023   10:50 AM  Opioid Risk   Alcohol 3  Illegal Drugs 0  Rx Drugs 0  Alcohol 3  Illegal Drugs 0  Rx Drugs 0  Age between 16-45 years  0  History of Preadolescent Sexual Abuse 0  Psychological Disease 0  Depression 1  Opioid Risk Tool Scoring 7  Opioid Risk Interpretation Moderate Risk    ORT Scoring interpretation table:  Score <3 = Low Risk for SUD  Score between 4-7 = Moderate Risk for SUD  Score >8 = High Risk for Opioid Abuse   Risk of substance use disorder (SUD): Low  Risk Mitigation Strategies:  Patient opioid safety counseling: No controlled substances prescribed. Patient-Prescriber Agreement (PPA): No agreement signed.  Controlled substance notification to other providers: None required. No opioid therapy.  Pharmacologic Plan: Non-opioid analgesic therapy offered. Interventional alternatives discussed.             Laboratory Chemistry Profile   Renal Lab Results  Component Value Date   BUN 17 12/30/2023   CREATININE 0.96 12/30/2023   BCR 18 12/30/2023   GFRAA >60 01/10/2017   GFRNONAA >60 06/29/2021   PROTEINUR NEGATIVE 05/03/2023  Electrolytes Lab Results  Component Value Date   NA 137 12/30/2023   K 4.7 12/30/2023   CL 100 12/30/2023   CALCIUM  8.9 12/30/2023   MG 1.8 12/30/2023     Hepatic Lab Results  Component Value Date   AST 30 12/30/2023   ALT 23 03/16/2015   ALBUMIN 4.1 12/30/2023   ALKPHOS 71 12/30/2023     ID Lab Results  Component Value Date   SARSCOV2NAA NEGATIVE 11/25/2021     Bone Lab Results  Component Value Date   25OHVITD1 27 (L) 12/30/2023   25OHVITD2 <1.0 12/30/2023   25OHVITD3 27 12/30/2023      Endocrine Lab Results  Component Value Date   GLUCOSE 205 (H) 12/30/2023   GLUCOSEU NEGATIVE 05/03/2023   HGBA1C 7.8 (H) 10/09/2012   TSH 1.820 06/28/2023   CRTSLPL  04/18/2010    7.5 (NOTE)  AM:  4.3 - 22.4 ug/dL PM:  3.1 - 83.2 ug/dL     Neuropathy Lab Results  Component Value Date   VITAMINB12 748 12/30/2023   HGBA1C 7.8 (H) 10/09/2012     CNS No results found for: COLORCSF, APPEARCSF, RBCCOUNTCSF, WBCCSF, POLYSCSF, LYMPHSCSF, EOSCSF, PROTEINCSF, GLUCCSF, JCVIRUS, CSFOLI, IGGCSF, LABACHR, ACETBL   Inflammation (CRP: Acute  ESR: Chronic) Lab Results  Component Value Date   CRP <1 12/30/2023   ESRSEDRATE 18 12/30/2023     Rheumatology Lab Results  Component Value Date   ANA Negative 03/19/2022     Coagulation Lab Results  Component Value Date   INR 1.07 01/10/2017   LABPROT 13.8 01/10/2017   PLT 232 02/01/2022     Cardiovascular Lab Results  Component Value Date   BNP 165.4 (H) 06/13/2021   CKTOTAL 255 (H) 07/08/2010   CKMB 4.8 (H) 07/08/2010   TROPONINI <0.03 10/24/2015   HGB 12.2 (L) 02/01/2022   HCT 37.5 (L) 02/01/2022     Screening Lab Results  Component Value Date   SARSCOV2NAA NEGATIVE 11/25/2021     Cancer No results found for: CEA, CA125, LABCA2   Allergens No results found for: ALMOND, APPLE, ASPARAGUS, AVOCADO, BANANA, BARLEY, BASIL, BAYLEAF, GREENBEAN, LIMABEAN, WHITEBEAN, BEEFIGE, REDBEET, BLUEBERRY, BROCCOLI, CABBAGE, MELON, CARROT, CASEIN, CASHEWNUT, CAULIFLOWER, CELERY     Note: Lab results reviewed.  Recent Diagnostic Imaging Review  Lumbosacral Imaging: Lumbar MR wo contrast: Results for orders placed during the hospital encounter of 01/08/24 MR LUMBAR SPINE WO CONTRAST  Narrative CLINICAL DATA:  Chronic low back pain.  No known injury.  EXAM: MRI LUMBAR SPINE WITHOUT CONTRAST  TECHNIQUE: Multiplanar, multisequence MR imaging of the lumbar spine  was performed. No intravenous contrast was administered.  COMPARISON:  Plain films lumbar spine 12/30/2023. MRI lumbar spine 03/30/2010.  FINDINGS: Segmentation:  Standard.  Alignment: Trace degenerative retrolisthesis at L2-3, L3-4 and L4-5 is unchanged.  Vertebrae: No acute fracture, evidence of discitis or worrisome marrow lesion. Severe, remote compression fracture of L2 and milder biconcave compression fractures of L4 and L5 are unchanged.  Conus medullaris and cauda equina: Conus extends to the L1-2 level. There is a syrinx in the distal cord extending from T12-L1 above the superior margin of the scan. This is present on the 2012 MRI. Buckling of descending nerve roots from L3-4 into the sacrum appears new since the prior MRI.  Paraspinal and other soft tissues: Negative.  Disc levels:  T11-12 is imaged in the sagittal plane only. There is shallow disc bulging which narrows the ventral thecal sac. The foramina are poorly seen but appear open.  T12-L1: Shallow disc bulge and mild facet arthritis.  No stenosis.  L1-2: Bony retropulsion off the superior endplate of L2 is unchanged. There is also shallow disc bulging. Mild to moderate central canal and lateral recess stenosis are seen. Lateral recess narrowing is worse on the right. The foramina are open.  L2-3: There is a shallow disc bulge to the left causing mild narrowing in the left lateral recess. The foramina are open.  L3-4: Broad-based disc bulge, ligamentum flavum thickening and mild facet arthropathy cause moderately severe to severe central canal and bilateral lateral recess narrowing. There is also moderate to severe foraminal stenosis, worse on the left. Degenerative change has progressed since the prior MRI.  L4-5: Broad-based disc bulge ligamentum flavum thickening cause moderate to moderately severe spinal stenosis and severe bilateral foraminal narrowing. Spondylosis appears slightly worse than on  the prior MRI.  L5-S1: Shallow disc bulge and facet arthritis. The central canal is open. Moderate to moderately severe foraminal stenosis is worse on the left and has progressed since the prior MRI.  IMPRESSION: 1. 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 2. Buckling of descending nerve roots from L3-4 into the sacrum appears new since the prior MRI. 3. Mild to moderate central canal and lateral recess stenosis at L1-2 is worse on the right. 4. Moderately severe to severe central canal and bilateral lateral recess narrowing at L3-4. Moderate to severe foraminal stenosis at L3-4 is worse on the left. 5. Moderate to moderately severe spinal stenosis and severe bilateral foraminal narrowing at L4-5. 6. Moderate to moderately severe foraminal stenosis at L5-S1 is worse on the left.   Electronically Signed By: Derek Prader M.D. On: 01/13/2024 11:06  Lumbar DG Bending views: Results for orders placed during the hospital encounter of 12/30/23 DG Lumbar Spine Complete W/Bend  Narrative EXAM: AP, both obliques, lateral, lumbosacral spot lateral, and flexion and extension lateral lumbar spine views (6 or more) 12/30/2023 12:39:00 PM  COMPARISON: CT abdomen and pelvis and reformatted imaging 05/14/2023.  CLINICAL HISTORY: Low back pain.  FINDINGS:  LUMBAR SPINE: BONES: There is osteopenia. There is a moderate chronic bow tie-shaped compression fracture of the L2 vertebral body, with the greatest height loss centrally and anteriorly, unchanged.  No new or progressive or further compression fracture is seen. Prominent endplate Schmorl nodes again shown at L4 and L5. Alignment: At L1-L2, there is anterolisthesis which measures 4 mm in neutral and extension, widening to 6 mm in flexion. At L2-L3, there is 6 mm retrolisthesis in neutral and in extension, which decreases to 4 mm in flexion. At L3-L4,  there is 5 mm retrolisthesis in all 3 positions.  There is no other alignment abnormality. No aggressive appearing osseous lesion.  DISCS AND DEGENERATIVE CHANGES: There is advanced marginal osteophytosis, particularly at L1-L2 and L2-L3 with anterior and right lateral bridging osteophytes between L1 and L2. There is mild to moderate disc space loss at L1-L2, L2-L3, L3-L4, and L4-L5. The disc is normal in height only at L5-S1.  There are moderate facet hypertrophic changes from L3-L4 down. There is spurring at the SI joints.  The foramina are not well seen, but there was noted acquired moderate foraminal stenosis bilaterally from L3-L4 down on the CT.  SOFT TISSUES: The aorta and iliac arteries are heavily calcified. No visible nephrolithiasis. No acute abnormality.  IMPRESSION: 1. Multilevel spondylolisthesis with dynamic instability no more than 2 mm. , including  4 mm anterolisthesis at L1-L2 increasing to 6 mm in flexion and retrolisthesis at L2-L3 and L3-L4, which may contribute to symptoms. 2. Unchanged moderate chronic bow tie-shaped compression fracture of the L2 vertebral body. 3. Advanced marginal osteophytosis, most pronounced at L1-L2 and L2-L3 with anterior and right lateral bridging osteophytes between L1 and L2. 4. Moderate multilevel facet hypertrophy from L3-L4 inferiorly. 5. Mild to moderate disc space loss from L1-L2 through L4-L5, with preserved disc height at L5-S1. 6. Moderate bilateral foraminal stenosis from L3-L4 inferiorly, previously demonstrated on CT.  Electronically signed by: Francis Quam MD 01/04/2024 03:59 AM EST RP Workstation: [id number]  Complexity Note: Imaging results reviewed.                         Meds  Current Medications[1]  ROS  Constitutional: Denies any fever or chills Gastrointestinal: No reported hemesis, hematochezia, vomiting, or acute GI distress Musculoskeletal: Denies any acute onset joint swelling, redness, loss of  ROM, or weakness Neurological: No reported episodes of acute onset apraxia, aphasia, dysarthria, agnosia, amnesia, paralysis, loss of coordination, or loss of consciousness  Allergies  Derek Hogan is allergic to simvastatin and lisinopril .  PFSH  Drug: Derek Hogan  reports no history of drug use. Alcohol:  reports that he does not currently use alcohol. Tobacco:  reports that he quit smoking about 29 years ago. His smoking use included cigarettes. He started smoking about 64 years ago. He has a 105 pack-year smoking history. He has never used smokeless tobacco. Medical:  has a past medical history of CAD, ARTERY BYPASS GRAFT, Carotid arterial disease, Diastolic dysfunction, Dyslipidemia, Erectile dysfunction, GERD (gastroesophageal reflux disease), Heart attack (HCC) (1996), HYPERLIPIDEMIA-MIXED (06/09/2008), Hypertension, HYPERTENSION, UNSPECIFIED (06/09/2008), Orthostatic hypotension, Palpitations, Prostate cancer (HCC), PVD (peripheral vascular disease), Transient ischemic attack (TIA), Type I diabetes mellitus (HCC) (dx'd 08/20/1966), Viral gastroenteritis, and Weakness. Surgical: Derek Hogan  has a past surgical history that includes Refractive surgery (Right); Carotid endarterectomy (Right); Coronary artery bypass graft (1996); Cataract extraction, bilateral (Bilateral); Cardiac catheterization (1996; ~ 2010); Coronary angioplasty; Cardiac catheterization (N/A, 12/15/2014); Mohs surgery; LEFT HEART CATH AND CORS/GRAFTS ANGIOGRAPHY (N/A, 01/11/2017); Esophagogastroduodenoscopy (N/A, 06/07/2020); Colonoscopy with propofol  (N/A, 06/07/2020); ABDOMINAL AORTOGRAM W/LOWER EXTREMITY (N/A, 06/07/2021); and PERIPHERAL VASCULAR ATHERECTOMY (06/07/2021). Family: family history includes Coronary artery disease in his father and mother; Diabetes in his father and mother; Stroke in an other family member.  Constitutional Exam  General appearance: Well nourished, well developed, and well hydrated. In no apparent acute  distress There were no vitals filed for this visit. BMI Assessment: Estimated body mass index is 33.36 kg/m as calculated from the following:   Height as of 01/08/24: 6' (1.829 m).   Weight as of 01/08/24: 246 lb (111.6 kg).  BMI interpretation table: BMI level Category Range association with higher incidence of chronic pain  <18 kg/m2 Underweight   18.5-24.9 kg/m2 Ideal body weight   25-29.9 kg/m2 Overweight Increased incidence by 20%  30-34.9 kg/m2 Obese (Class I) Increased incidence by 68%  35-39.9 kg/m2 Severe obesity (Class II) Increased incidence by 136%  >40 kg/m2 Extreme obesity (Class III) Increased incidence by 254%   Patient's current BMI Ideal Body weight  There is no height or weight on file to calculate BMI. Ideal body weight: 77.6 kg (171 lb 1.2 oz) Adjusted ideal body weight: 91.2 kg (201 lb 0.7 oz)   BMI Readings from Last 4 Encounters:  01/08/24 33.36 kg/m  12/30/23 33.77 kg/m  09/27/23 32.75 kg/m  06/26/23 32.58 kg/m   Wt Readings from Last 4 Encounters:  01/08/24 246 lb (111.6 kg)  12/30/23 249 lb (112.9 kg)  09/27/23 241 lb 8 oz (109.5 kg)  06/26/23 240 lb 3.2 oz (109 kg)    Psych/Mental status: Alert, oriented x 3 (person, place, & time)       Eyes: PERLA Respiratory: No evidence of acute respiratory distress  Assessment & Plan  Primary Diagnosis & Pertinent Problem List: The primary encounter diagnosis was Chronic low back pain (1ry area of Pain) (Bilateral) w/o sciatica. Diagnoses of Low back pain of over 3 months duration, Intermittent low back pain, Spondylolisthesis of lumbar region with dynamic instability (L1-2, L2-3, L3-4), Vertebrogenic low back pain, Abnormal MRI, lumbar spine (03/30/2010), and Abnormal x-ray of lumbar spine (01/04/2024) were also pertinent to this visit. Visit Diagnosis: 1. Chronic low back pain (1ry area of Pain) (Bilateral) w/o sciatica   2. Low back pain of over 3 months duration   3. Intermittent low back pain   4.  Spondylolisthesis of lumbar region with dynamic instability (L1-2, L2-3, L3-4)   5. Vertebrogenic low back pain   6. Abnormal MRI, lumbar spine (03/30/2010)   7. Abnormal x-ray of lumbar spine (01/04/2024)    Problems updated and reviewed during this visit: Problem  Abnormal x-ray of lumbar spine (01/04/2024)   (01/04/2024) FINDINGS:   LUMBAR SPINE: BONES: There is osteopenia. There is a moderate chronic bow tie-shaped compression fracture of the L2 vertebral body, with the greatest height loss centrally and anteriorly, unchanged.   No new or progressive or further compression fracture is seen. Prominent endplate Schmorl nodes again shown at L4 and L5. Alignment: At L1-L2, there is anterolisthesis which measures 4 mm in neutral and extension, widening to 6 mm in flexion. At L2-L3, there is 6 mm retrolisthesis in neutral and in extension, which decreases to 4 mm in flexion. At L3-L4, there is 5 mm retrolisthesis in all 3 positions.   There is no other alignment abnormality. No aggressive appearing osseous lesion.   DISCS AND DEGENERATIVE CHANGES: There is advanced marginal osteophytosis, particularly at L1-L2 and L2-L3 with anterior and right lateral bridging osteophytes between L1 and L2. There is mild to moderate disc space loss at L1-L2, L2-L3, L3-L4, and L4-L5. The disc is normal in height only at L5-S1.   There are moderate facet hypertrophic changes from L3-L4 down. There is spurring at the SI joints.   The foramina are not well seen, but there was noted acquired moderate foraminal stenosis bilaterally from L3-L4 down on the CT.   SOFT TISSUES: The aorta and iliac arteries are heavily calcified. No visible nephrolithiasis. No acute abnormality.   IMPRESSION: 1. Multilevel spondylolisthesis with dynamic instability no more than 2 mm. , including 4 mm anterolisthesis at L1-L2 increasing to 6 mm in flexion and retrolisthesis at L2-L3 and L3-L4, which may contribute to  symptoms. 2. Unchanged moderate chronic bow tie-shaped compression fracture of the L2 vertebral body. 3. Advanced marginal osteophytosis, most pronounced at L1-L2 and L2-L3 with anterior and right lateral bridging osteophytes between L1 and L2. 4. Moderate multilevel facet hypertrophy from L3-L4 inferiorly. 5. Mild to moderate disc space loss from L1-L2 through L4-L5, with preserved disc height at L5-S1. 6. Moderate bilateral foraminal stenosis from L3-L4 inferiorly, previously demonstrated on CT.   Electronically signed by: Francis Quam MD 01/04/2024   Abnormal MRI, lumbar spine (01/13/2024)   (01/13/2024) FINDINGS: Segmentation:  Standard.   Alignment: Trace degenerative retrolisthesis at L2-3, L3-4 and  L4-5 is unchanged.   Vertebrae: No acute fracture, evidence of discitis or worrisome marrow lesion. Severe, remote compression fracture of L2 and milder biconcave compression fractures of L4 and L5 are unchanged.   Conus medullaris and cauda equina: Conus extends to the L1-2 level. There is a syrinx in the distal cord extending from T12-L1 above the superior margin of the scan. This is present on the 2012 MRI. Buckling of descending nerve roots from L3-4 into the sacrum appears new since the prior MRI.   Paraspinal and other soft tissues: Negative.   Disc levels:   T11-12 is imaged in the sagittal plane only. There is shallow disc bulging which narrows the ventral thecal sac. The foramina are poorly seen but appear open.   T12-L1: Shallow disc bulge and mild facet arthritis.  No stenosis.   L1-2: Bony retropulsion off the superior endplate of L2 is unchanged. There is also shallow disc bulging. Mild to moderate central canal and lateral recess stenosis are seen. Lateral recess narrowing is worse on the right. The foramina are open.   L2-3: There is a shallow disc bulge to the left causing mild narrowing in the left lateral recess. The foramina are open.   L3-4:  Broad-based disc bulge, ligamentum flavum thickening and mild facet arthropathy cause moderately severe to severe central canal and bilateral lateral recess narrowing. There is also moderate to severe foraminal stenosis, worse on the left. Degenerative change has progressed since the prior MRI.   L4-5: Broad-based disc bulge ligamentum flavum thickening cause moderate to moderately severe spinal stenosis and severe bilateral foraminal narrowing. Spondylosis appears slightly worse than on the prior MRI.   L5-S1: Shallow disc bulge and facet arthritis. The central canal is open. Moderate to moderately severe foraminal stenosis is worse on the left and has progressed since the prior MRI.   IMPRESSION: 1. 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 2. Buckling of descending nerve roots from L3-4 into the sacrum appears new since the prior MRI. 3. Mild to moderate central canal and lateral recess stenosis at L1-2 is worse on the right. 4. Moderately severe to severe central canal and bilateral lateral recess narrowing at L3-4. Moderate to severe foraminal stenosis at L3-4 is worse on the left. 5. Moderate to moderately severe spinal stenosis and severe bilateral foraminal narrowing at L4-5. 6. Moderate to moderately severe foraminal stenosis at L5-S1 is worse on the left.     Electronically Signed   By: Derek Prader M.D.   On: 01/13/2024 11:06 __________________________________________________________________________  (03/30/2010) LUMBAR SPINE MRI FINDINGS:  Severe compression deformity involving L2. There is no evidence of marrow edema and the findings are consistent with a chronic deformity. The deformity appears to be on the magnitude of 75% to 80% in maximal compression. Central  endplate depression is appreciated involving L4 and L5. A Schmorl's node is appreciated. The central endplate depression involves  the superior endplate of L4 and to a lesser extent L5. In the interim there has been approximately 25% to 30% central vertebral body height loss of L4. L5 appears stable. These findings also appear chronic. At the L2 level there is evidence of  retropulsion of the posterior elements of the superior aspect of the  vertebral body. This causes effacement of the anterior CSF space with encroachment upon the thecal sac and moderate thecal sac stenosis, which is also secondary to ligamentum flavum and facet hypertrophy.  The conus medullaris terminates at an L1 level. The cauda equina are grossly unremarkable.   T12-L1: No evidence of thecal sac stenosis nor neural foraminal narrowing.   L1-2: Broad-based disc bulge is also appreciated which contributes to the moderate multifactorial thecal sac stenosis. The neural foramen appears patent.   L2-3: Broad-based disc bulge is appreciated causing partial effacement of the anterior CSF space and demonstrating lateralization. There is moderate neural foraminal narrowing on the left without evidence of  exiting nerve root compression or compromise. Mild neural foraminal narrowing is appreciated on the right.   L3-4: Broad-based disc bulge is appreciated demonstrating lateralization to the right and left. There is moderate to  severe stenosis on the left and moderate stenosis on the right. Exiting nerve root compromise and possible compression on the left is of diagnostic concern.   L4-5: Multifactorial mild to moderate thecal sac stenosis is appreciated secondary to a broad-based disc bulge as well as facet and ligamentum flavum hypertrophy. There is bilateral moderate neural foraminal narrowing with possible exiting nerve root compromise.   L5-S1: No evidence of thecal sac stenosis. There is bilateral moderate to severe neural foraminal narrowing with possible exiting nerve root compression and compromise.   When compared to the previous study, these findings  have developed in the interim.   IMPRESSION:  1. Severe compression deformity involving L2, which has a chronic appearance. There is a component of retropulsion of the superior aspect of the posterior wall with resulting thecal sac narrowing.  2. Superior endplate depression involving L4 as described above. This, too, has a chronic appearance.  3. Chronic mild vertebral body height loss involving L5.  4. Multilevel degenerative changes which are multifactorial and resulting in multiple levels of thecal sac stenosis and neural foraminal narrowing.   Gastroesophageal Reflux   Last Assessment & Plan:  Formatting of this note might be different from the original. More cp and burning lately, went to the er one day when it persisted. Better for 3 days now. He discussed it with his cardiologist who did not think he needed to see him sooner or have further wu per pt and wife. Sob with the one persistent episode only. Did start taking his omeprazole more often     Plan of Care  Assessment and Plan             Pharmacotherapy (Medications Ordered): No orders of the defined types were placed in this encounter.  Procedure Orders    No procedure(s) ordered today   No orders of the defined types were placed in this encounter.  Lab Orders  No laboratory test(s) ordered today   Imaging Orders  No imaging studies ordered today   Referral Orders  No referral(s) requested today    Pharmacological management:  Opioid Analgesics: I will not be prescribing any opioids at this time Membrane stabilizer: I will not be prescribing any at this time Muscle relaxant: I will not be prescribing any at this time NSAID: I will not be prescribing any at this time Other analgesic(s): I will not be prescribing any at this time      Interventional Therapies  Risk Factors  Considerations  Medical Comorbidities:  COPD  BA  Bronchiectasia  Carotid Stenosis  Hx. TIAs  CAD  Hx. CABG  T2IDDM w/  insulin Pump  HTN  GERD  OSA  Hx. Orthostatic Hypotension  Dizziness  Hx. Syncope  Insulin Pump in-situ  Hx. Prostate & Skin Cancer  PVD  Planned  Pending:      Under consideration:   Pending   Completed: (Analgesic benefit)1  None at this time   Therapeutic  Palliative (PRN) options:   None established   Completed by other providers:   Therapeutic bilateral L3-S1 Facet Blk x1 (05/12/2020) by Daphne Ozell Lighter, MD ([facility])  Diagnostic left L2-L5 Facet MBB x2 (05/26/2020, 06/02/2020) by Daphne Ozell Lighter, MD ([facility])  Therapeutic left L2-L5 Facet MB RFA x1 (06/23/2020) by Daphne Ozell Lighter, MD ([facility])  Diagnostic right L2-L5 Facet MBB x2 (08/25/2020, 09/08/2020) by Daphne Ozell Lighter, MD ([facility])  Therapeutic right L2-L5 Facet MB RFA x1 (09/22/2020) by Daphne Ozell Lighter, MD ([facility])  Diagnostic right SI joint block x2 (11/17/2020, 01/12/2021) by Daphne Ozell Lighter, MD ([facility])  Therapeutic L4-5 LESI x1 (04/20/2021) by Daphne Ozell Lighter, MD ([facility])  Diagnostic lumbar spinal cord stimulator trial (09/18/2021) by Aloha BROCKS.Claudene, MD ([facility])   1(Analgesic benefit): Expressed in percentage (%). (Local anesthetic[LA] +/- sedation  L.A.Local Anesthetic  Steroid benefit  Ongoing benefit)      Provider-requested follow-up: No follow-ups on file. Recent Visits Date Type Provider Dept  12/30/23 Office Visit Tanya Glisson, MD [facility]  Showing recent visits within past 90 days and meeting all other requirements Future Appointments Date Type Provider Dept  01/20/24 Appointment Tanya Glisson, MD [facility]  Showing future appointments within next 90 days and meeting all other requirements   Primary Care Physician: [name] ORN, MD  Duration of encounter: *** minutes.  Total time on encounter, as per AMA guidelines included both the face-to-face and non-face-to-face time  personally spent by the physician and/or other qualified health care professional(s) on the day of the encounter (includes time in activities that require the physician or other qualified health care professional and does not include time in activities normally performed by clinical staff). Physician's time may include the following activities when performed: Preparing to see the patient (e.g., pre-charting review of records, searching for previously ordered imaging, lab work, and nerve conduction tests) Review of prior analgesic pharmacotherapies. Reviewing PMP Interpreting ordered tests (e.g., lab work, imaging, nerve conduction tests) Performing post-procedure evaluations, including interpretation of diagnostic procedures Obtaining and/or reviewing separately obtained history Performing a medically appropriate examination and/or evaluation Counseling and educating the patient/family/caregiver Ordering medications, tests, or procedures Referring and communicating with other health care professionals (when not separately reported) Documenting clinical information in the electronic or other health record Independently interpreting results (not separately reported) and communicating results to the patient/ family/caregiver Care coordination (not separately reported)  Note by: Glisson DELENA Tanya, MD (TTS technology used. I apologize for any typographical errors that were not detected and corrected.) Date: 01/20/2024; Time: 12:51 PM     [1]  Current Outpatient Medications:    amLODipine (NORVASC) 10 MG tablet, Take 10 mg by mouth daily., Disp: , Rfl:    aspirin  81 MG tablet, Take 81 mg by mouth at bedtime., Disp: , Rfl:    atorvastatin  (LIPITOR ) 80 MG tablet, Take 1 tablet (80 mg total) by mouth daily., Disp: , Rfl:    bisacodyl (DULCOLAX) 5 MG EC tablet, Take 5 mg by mouth daily as needed., Disp: , Rfl:    Calcium  Carb-Cholecalciferol 600-10 MG-MCG TABS, Take 2 tablets by mouth daily., Disp:  , Rfl:    Calcium -Magnesium-Zinc (CAL-MAG-ZINC PO), Take 1 tablet by mouth daily., Disp: , Rfl:    carvedilol  (COREG ) 6.25 MG tablet, Take 1 tablet (  6.25 mg total) by mouth 2 (two) times daily., Disp: 180 tablet, Rfl: 3   ezetimibe (ZETIA) 10 MG tablet, Take 10 mg by mouth daily., Disp: , Rfl:    fluticasone -salmeterol (WIXELA INHUB) 250-50 MCG/ACT AEPB, Inhale 1 puff into the lungs in the morning and at bedtime., Disp: 60 each, Rfl: 2   glucagon 1 MG injection, 1 mg once as needed (low blood glucose)., Disp: , Rfl:    insulin aspart (NOVOLOG) 100 unit/mL injection, Inject into the skin continuous. INSULIN PUMP, Disp: , Rfl:    nitroGLYCERIN  (NITROSTAT ) 0.4 MG SL tablet, Place 0.4 mg under the tongue every 5 (five) minutes as needed for chest pain. , Disp: , Rfl:    omeprazole (PRILOSEC) 20 MG capsule, Take 20 mg by mouth every morning. , Disp: , Rfl:    sertraline (ZOLOFT) 100 MG tablet, Take 100 mg by mouth daily., Disp: , Rfl:    tamsulosin (FLOMAX) 0.4 MG CAPS capsule, Take 0.4 mg by mouth., Disp: , Rfl:    Tiotropium Bromide-Olodaterol 2.5-2.5 MCG/ACT AERS, Inhale into the lungs., Disp: , Rfl:    torsemide  (DEMADEX ) 20 MG tablet, Take 20 mg by mouth every other day., Disp: , Rfl:    traZODone (DESYREL) 50 MG tablet, Take 25 mg by mouth at bedtime., Disp: , Rfl:

## 2024-01-20 ENCOUNTER — Encounter: Payer: Self-pay | Admitting: Pain Medicine

## 2024-01-20 ENCOUNTER — Ambulatory Visit: Admitting: Pain Medicine

## 2024-01-20 VITALS — BP 130/59 | HR 69 | Temp 98.2°F | Resp 16 | Ht 72.0 in | Wt 245.0 lb

## 2024-01-20 DIAGNOSIS — E559 Vitamin D deficiency, unspecified: Secondary | ICD-10-CM | POA: Diagnosis present

## 2024-01-20 DIAGNOSIS — S32040S Wedge compression fracture of fourth lumbar vertebra, sequela: Secondary | ICD-10-CM | POA: Diagnosis present

## 2024-01-20 DIAGNOSIS — M47816 Spondylosis without myelopathy or radiculopathy, lumbar region: Secondary | ICD-10-CM | POA: Insufficient documentation

## 2024-01-20 DIAGNOSIS — M5459 Other low back pain: Secondary | ICD-10-CM | POA: Insufficient documentation

## 2024-01-20 DIAGNOSIS — M5134 Other intervertebral disc degeneration, thoracic region: Secondary | ICD-10-CM | POA: Diagnosis present

## 2024-01-20 DIAGNOSIS — R937 Abnormal findings on diagnostic imaging of other parts of musculoskeletal system: Secondary | ICD-10-CM | POA: Insufficient documentation

## 2024-01-20 DIAGNOSIS — M4310 Spondylolisthesis, site unspecified: Secondary | ICD-10-CM | POA: Insufficient documentation

## 2024-01-20 DIAGNOSIS — M545 Low back pain, unspecified: Secondary | ICD-10-CM

## 2024-01-20 DIAGNOSIS — M48061 Spinal stenosis, lumbar region without neurogenic claudication: Secondary | ICD-10-CM | POA: Diagnosis present

## 2024-01-20 DIAGNOSIS — M5451 Vertebrogenic low back pain: Secondary | ICD-10-CM | POA: Insufficient documentation

## 2024-01-20 DIAGNOSIS — G8929 Other chronic pain: Secondary | ICD-10-CM | POA: Insufficient documentation

## 2024-01-20 DIAGNOSIS — M4316 Spondylolisthesis, lumbar region: Secondary | ICD-10-CM | POA: Insufficient documentation

## 2024-01-20 DIAGNOSIS — G950 Syringomyelia and syringobulbia: Secondary | ICD-10-CM | POA: Diagnosis present

## 2024-01-20 DIAGNOSIS — M4856XS Collapsed vertebra, not elsewhere classified, lumbar region, sequela of fracture: Secondary | ICD-10-CM | POA: Insufficient documentation

## 2024-01-20 DIAGNOSIS — M48062 Spinal stenosis, lumbar region with neurogenic claudication: Secondary | ICD-10-CM | POA: Insufficient documentation

## 2024-01-20 NOTE — Patient Instructions (Addendum)
 ______________________________________________________________________  Spondylolisthesis  Spondylolisthesis is a condition that occurs when a vertebra in the spine slips out of place, usually in the lower back. Symptoms can vary from mild to severe, and a person may have no symptoms.  Some common symptoms include:  Back pain, especially chronic pain  Pain that radiates down the legs  Pain that worsens with exercise  Tightness in the hamstrings  Neck stiffness  Loss of spine flexibility  Weakness in the legs or trouble walking  Numbness and tingling in the groin and/or buttocks   Some causes of spondylolisthesis include: Birth defects. Sudden injury. Abnormal wear on the cartilage and bones, such as arthritis. Bone disease and fractures. Certain sports activities, such as gymnastics, weightlifting, and football.  A doctor can diagnose spondylolisthesis with a physical exam, X-rays, and possibly a CT scan.    Forward slippage is known as Anterolisthesis.  Backward slippage is known as Retrolisthesis.   Pathophysiology of Spondylolisthesis:   Grading Classification of Spondylolisthesis Grade I spondylolisthesis is 1 to 25% slippage, grade II is up to 50% slippage, grade III is up to 75% slippage, and grade IV is 76-100% slippage. If there is more than 100% slippage, it is known as spondyloptosis or grade V spondylolisthesis.       ______________________________________________________________________   ______________________________________________________________________  Syrinx of Spinal Cord  What is a Syrinx? A syrinx is a fluid-filled cyst (cavity) within the spinal cord itself, leading to the condition syringomyelia.  This cyst expands, compressing and damaging the spinal cord and nerve fibers that transmit signals.   Common causes? Chiari Malformation (Type I): Most common cause, where brain tissue protrudes into the spinal canal, disrupting CSF  flow.  Trauma: Injury or inflammation to the spinal cord.  Spinal Cord Tumors: Can block CSF flow.  Spina Bifida: Tethering of the spinal cord  Symptoms?  (Develop slowly, but can be sudden) Pain & Weakness: Back, shoulders, arms, legs.  Sensory Loss: Inability to feel extremes of hot or cold, especially in hands (cape-like distribution).  Stiffness.  Headaches.  Diagnosis & Treatment? Diagnosis: MRI of the spine is crucial for identification.  Treatment: Depends on severity.  Monitoring: For asymptomatic cases.  Surgery: To relieve pressure and restore CSF flow, often involving decompression for Chiari malformations or untethering for spina bifida.      ______________________________________________________________________  ______________________________________________________________________    Procedure instructions  Stop blood-thinners  Do not eat or drink fluids (other than water) for 6 hours before your procedure  No water for 2 hours before your procedure  Take your blood pressure medicine with a sip of water  Arrive 30 minutes before your appointment  If sedation is planned, bring suitable driver. [place], [place], & public transportation are NOT APPROVED)  Carefully read the Preparing for your procedure detailed instructions  If you have questions call us  at [phone]  Procedure appointments are for procedures only.   NO medication refills or new problem evaluations will be done on procedure days.   Only the scheduled, pre-approved procedure and side will be done.   ______________________________________________________________________     ______________________________________________________________________    Preparing for your procedure  Appointments: If you think you may not be able to keep your appointment, call 24-48 hours in advance to cancel. We need time to make it available to others.  Procedure visits are for procedures only.  During your procedure appointment there will be: NO Prescription Refills*. NO medication changes or discussions*. NO discussion of disability issues*. NO unrelated pain problem  evaluations*. NO evaluations to order other pain procedures*. *These will be addressed at a separate and distinct evaluation encounter on the provider's evaluation schedule and not during procedure days.  Instructions: Food intake: Avoid eating anything solid for at least 8 hours prior to your procedure. Clear liquid intake: You may take clear liquids such as water up to 2 hours prior to your procedure. (No carbonated drinks. No soda.) Transportation: Unless otherwise stated by your physician, bring a driver. (Driver cannot be a Market Researcher, Pharmacist, Community, or any other form of public transportation.) Morning Medicines: Except for blood thinners, take all of your other morning medications with a sip of water. Make sure to take your heart and blood pressure medicines. If your blood pressure's lower number is above 100, the case will be rescheduled. Blood thinners: Make sure to stop your blood thinners as instructed.  If you take a blood thinner, but were not instructed to stop it, call our office [phone] and ask to talk to a nurse. Not stopping a blood thinner prior to certain procedures could lead to serious complications. Diabetics on insulin: Notify the staff so that you can be scheduled 1st case in the morning. If your diabetes requires high dose insulin, take only  of your normal insulin dose the morning of the procedure and notify the staff that you have done so. Preventing infections: Shower with an antibacterial soap the morning of your procedure.  Build-up your immune system: Take 1000 mg of Vitamin C with every meal (3 times a day) the day prior to your procedure. Antibiotics: Inform the nursing staff if you are taking any antibiotics or if you have any conditions that may require antibiotics prior to procedures. (Example:  recent joint implants)   Pregnancy: If you are pregnant make sure to notify the nursing staff. Not doing so may result in injury to the fetus, including death.  Sickness: If you have a cold, fever, or any active infections, call and cancel or reschedule your procedure. Receiving steroids while having an infection may result in complications. Arrival: You must be in the facility at least 30 minutes prior to your scheduled procedure. Tardiness: Your scheduled time is also the cutoff time. If you do not arrive at least 15 minutes prior to your procedure, you will be rescheduled.  Children: Do not bring any children with you. Make arrangements to keep them home. Dress appropriately: There is always a possibility that your clothing may get soiled. Avoid long dresses. Valuables: Do not bring any jewelry or valuables.  Reasons to call and reschedule or cancel your procedure: (Following these recommendations will minimize the risk of a serious complication.) Surgeries: Avoid having procedures within 2 weeks of any surgery. (Avoid for 2 weeks before or after any surgery). Flu Shots: Avoid having procedures within 2 weeks of a flu shots or . (Avoid for 2 weeks before or after immunizations). Barium: Avoid having a procedure within 7-10 days after having had a radiological study involving the use of radiological contrast. (Myelograms, Barium swallow or enema study). Heart attacks: Avoid any elective procedures or surgeries for the initial 6 months after a Myocardial Infarction (Heart Attack). Blood thinners: It is imperative that you stop these medications before procedures. Let us  know if you if you take any blood thinner.  Infection: Avoid procedures during or within two weeks of an infection (including chest colds or gastrointestinal problems). Symptoms associated with infections include: Localized redness, fever, chills, night sweats or profuse sweating, burning sensation when voiding, cough,  congestion,  stuffiness, runny nose, sore throat, diarrhea, nausea, vomiting, cold or Flu symptoms, recent or current infections. It is specially important if the infection is over the area that we intend to treat. Heart and lung problems: Symptoms that may suggest an active cardiopulmonary problem include: cough, chest pain, breathing difficulties or shortness of breath, dizziness, ankle swelling, uncontrolled high or unusually low blood pressure, and/or palpitations. If you are experiencing any of these symptoms, cancel your procedure and contact your primary care physician for an evaluation.  Remember:  Regular Business hours are:  Monday to Thursday 8:00 AM to 4:00 PM  Provider's Schedule: Eric Como, MD:  Procedure days: Tuesday and Thursday 7:30 AM to 4:00 PM  Wallie Sherry, MD:  Procedure days: Monday and Wednesday 7:30 AM to 4:00 PM Last  Updated: 01/15/2023 ______________________________________________________________________     ______________________________________________________________________    General Risks and Possible Complications  Patient Responsibilities: It is important that you read this as it is part of your informed consent. It is our duty to inform you of the risks and possible complications associated with treatments offered to you. It is your responsibility as a patient to read this and to ask questions about anything that is not clear or that you believe was not covered in this document.  Patients Rights: You have the right to refuse treatment. You also have the right to change your mind, even after initially having agreed to have the treatment done. However, under this last option, if you wait until the last second to change your mind, you may be charged for the materials used up to that point.  Introduction: Medicine is not an visual merchandiser. Everything in Medicine, including the lack of treatment(s), carries the potential for danger, harm, or loss (which is by  definition: Risk). In Medicine, a complication is a secondary problem, condition, or disease that can aggravate an already existing one. All treatments carry the risk of possible complications. The fact that a side effects or complications occurs, does not imply that the treatment was conducted incorrectly. It must be clearly understood that these can happen even when everything is done following the highest safety standards.  No treatment: You can choose not to proceed with the proposed treatment alternative. The PRO(s) would include: avoiding the risk of complications associated with the therapy. The CON(s) would include: not getting any of the treatment benefits. These benefits fall under one of three categories: diagnostic; therapeutic; and/or palliative. Diagnostic benefits include: getting information which can ultimately lead to improvement of the disease or symptom(s). Therapeutic benefits are those associated with the successful treatment of the disease. Finally, palliative benefits are those related to the decrease of the primary symptoms, without necessarily curing the condition (example: decreasing the pain from a flare-up of a chronic condition, such as incurable terminal cancer).  General Risks and Complications: These are associated to most interventional treatments. They can occur alone, or in combination. They fall under one of the following six (6) categories: no benefit or worsening of symptoms; bleeding; infection; nerve damage; allergic reactions; and/or death. No benefits or worsening of symptoms: In Medicine there are no guarantees, only probabilities. No healthcare provider can ever guarantee that a medical treatment will work, they can only state the probability that it may. Furthermore, there is always the possibility that the condition may worsen, either directly, or indirectly, as a consequence of the treatment. Bleeding: This is more common if the patient is taking a blood  thinner, either prescription or over  the counter (example: Goody Powders, Fish oil, Aspirin , Garlic, etc.), or if suffering a condition associated with impaired coagulation (example: Hemophilia, cirrhosis of the liver, low platelet counts, etc.). However, even if you do not have one on these, it can still happen. If you have any of these conditions, or take one of these drugs, make sure to notify your treating physician. Infection: This is more common in patients with a compromised immune system, either due to disease (example: diabetes, cancer, human immunodeficiency virus [HIV], etc.), or due to medications or treatments (example: therapies used to treat cancer and rheumatological diseases). However, even if you do not have one on these, it can still happen. If you have any of these conditions, or take one of these drugs, make sure to notify your treating physician. Nerve Damage: This is more common when the treatment is an invasive one, but it can also happen with the use of medications, such as those used in the treatment of cancer. The damage can occur to small secondary nerves, or to large primary ones, such as those in the spinal cord and brain. This damage may be temporary or permanent and it may lead to impairments that can range from temporary numbness to permanent paralysis and/or brain death. Allergic Reactions: Any time a substance or material comes in contact with our body, there is the possibility of an allergic reaction. These can range from a mild skin rash (contact dermatitis) to a severe systemic reaction (anaphylactic reaction), which can result in death. Death: In general, any medical intervention can result in death, most of the time due to an unforeseen complication. ______________________________________________________________________      ______________________________________________________________________    Steroid injections  Common steroids for injections Triamcinolone :  Used by many sports medicine physicians for large joint and bursal injections, often combined with a local anesthetic like lidocaine . A study focusing on coccydynia (tailbone pain) found triamcinolone  was more effective than betamethasone , suggesting it may also be preferable for other localized inflammation conditions. Methylprednisolone : A common alternative to triamcinolone  that is also a strong anti-inflammatory. It is available in different formulations, with the acetate suspension being the long-acting option for intra-articular injections. Dexamethasone : This is a non-particulate steroid, meaning it has a lower risk of tissue damage compared to particulate steroids like triamcinolone  and methylprednisolone . While less common for this specific use, it is an option for targeted injections.   Considerations for physicians Particulate vs. non-particulate steroids: Triamcinolone  and methylprednisolone  are particulate, meaning they can clump together. Dexamethasone  is non-particulate. Particulate steroids are often preferred for their longer-lasting effects but carry a theoretical higher risk for certain injections (though this is less of a concern in the costochondral joints). Combined injectate: Corticosteroids are typically mixed with a local anesthetic like lidocaine  to provide both immediate pain relief (from the anesthetic) and longer-term inflammation reduction (from the steroid). Imaging guidance: To ensure accurate placement of the needle and medication, physicians may use ultrasound or fluoroscopic guidance for the injection, especially in complex or refractory cases.   Patient guidance Before undergoing a steroid injection, discuss the options with your physician. They will determine the best steroid, dosage, and procedure for your specific case based on factors like: Severity of your condition History of response to other treatments Your overall health status Experience and preference of  the physician  Last  Updated: 10/01/2023 ______________________________________________________________________

## 2024-01-22 ENCOUNTER — Encounter (HOSPITAL_COMMUNITY): Payer: Self-pay

## 2024-01-22 ENCOUNTER — Ambulatory Visit: Admission: RE | Admit: 2024-01-22 | Discharge: 2024-01-22 | Attending: Pain Medicine | Admitting: Pain Medicine

## 2024-01-22 DIAGNOSIS — G8929 Other chronic pain: Secondary | ICD-10-CM | POA: Diagnosis present

## 2024-01-22 DIAGNOSIS — M545 Low back pain, unspecified: Secondary | ICD-10-CM | POA: Diagnosis present

## 2024-01-22 DIAGNOSIS — M5134 Other intervertebral disc degeneration, thoracic region: Secondary | ICD-10-CM | POA: Insufficient documentation

## 2024-01-22 DIAGNOSIS — G950 Syringomyelia and syringobulbia: Secondary | ICD-10-CM | POA: Diagnosis present

## 2024-01-23 ENCOUNTER — Ambulatory Visit: Payer: Self-pay | Admitting: Nurse Practitioner

## 2024-01-23 ENCOUNTER — Ambulatory Visit
Admission: RE | Admit: 2024-01-23 | Discharge: 2024-01-23 | Disposition: A | Discharge status: Home / Self Care | Source: Ambulatory Visit | Attending: Nurse Practitioner | Admitting: Nurse Practitioner

## 2024-01-23 DIAGNOSIS — K449 Diaphragmatic hernia without obstruction or gangrene: Secondary | ICD-10-CM | POA: Insufficient documentation

## 2024-01-23 DIAGNOSIS — R0609 Other forms of dyspnea: Secondary | ICD-10-CM | POA: Diagnosis not present

## 2024-01-23 DIAGNOSIS — I700 Atherosclerosis of aorta: Secondary | ICD-10-CM | POA: Diagnosis not present

## 2024-01-23 DIAGNOSIS — J849 Interstitial pulmonary disease, unspecified: Secondary | ICD-10-CM | POA: Insufficient documentation

## 2024-01-23 DIAGNOSIS — R0602 Shortness of breath: Secondary | ICD-10-CM | POA: Insufficient documentation

## 2024-01-23 LAB — NM PET CT CARDIAC PERFUSION MULTI W/ABSOLUTE BLOODFLOW
LV dias vol: 167 mL (ref 62–150)
Nuc Rest EF: 51 %
Nuc Stress EF: 53 %
Peak HR: 77 {beats}/min
Rest HR: 71 {beats}/min
Rest Nuclear Isotope Dose: 25.1 mCi
SRS: 20
SSS: 25
ST Depression (mm): 0 mm
Stress Nuclear Isotope Dose: 25.1 mCi
TID: 1.02

## 2024-01-23 MED ORDER — REGADENOSON 0.4 MG/5ML IV SOLN
0.4000 mg | Freq: Once | INTRAVENOUS | Status: AC
  Administered 2024-01-23: 14:00:00 0.4 mg via INTRAVENOUS
  Filled 2024-01-23: qty 5

## 2024-01-23 MED ORDER — RUBIDIUM RB82 GENERATOR (RUBYFILL)
25.0000 | PACK | Freq: Once | INTRAVENOUS | Status: AC
  Administered 2024-01-23: 14:00:00 25.07 via INTRAVENOUS

## 2024-01-23 MED ORDER — REGADENOSON 0.4 MG/5ML IV SOLN
INTRAVENOUS | Status: AC
  Filled 2024-01-23: qty 5

## 2024-01-23 MED ORDER — RUBIDIUM RB82 GENERATOR (RUBYFILL)
25.0000 | PACK | Freq: Once | INTRAVENOUS | Status: AC
  Administered 2024-01-23: 14:00:00 25.08 via INTRAVENOUS

## 2024-02-11 ENCOUNTER — Ambulatory Visit: Admitting: Pain Medicine

## 2024-02-19 ENCOUNTER — Encounter: Payer: Self-pay | Admitting: Nurse Practitioner

## 2024-02-19 ENCOUNTER — Ambulatory Visit: Attending: Nurse Practitioner | Admitting: Nurse Practitioner

## 2024-02-19 VITALS — BP 128/56 | HR 72 | Ht 72.0 in | Wt 244.0 lb

## 2024-02-19 DIAGNOSIS — I5032 Chronic diastolic (congestive) heart failure: Secondary | ICD-10-CM | POA: Insufficient documentation

## 2024-02-19 DIAGNOSIS — E119 Type 2 diabetes mellitus without complications: Secondary | ICD-10-CM | POA: Diagnosis not present

## 2024-02-19 DIAGNOSIS — I779 Disorder of arteries and arterioles, unspecified: Secondary | ICD-10-CM | POA: Diagnosis not present

## 2024-02-19 DIAGNOSIS — I10 Essential (primary) hypertension: Secondary | ICD-10-CM | POA: Insufficient documentation

## 2024-02-19 DIAGNOSIS — I2510 Atherosclerotic heart disease of native coronary artery without angina pectoris: Secondary | ICD-10-CM | POA: Diagnosis not present

## 2024-02-19 DIAGNOSIS — E785 Hyperlipidemia, unspecified: Secondary | ICD-10-CM | POA: Diagnosis not present

## 2024-02-19 DIAGNOSIS — Z794 Long term (current) use of insulin: Secondary | ICD-10-CM | POA: Insufficient documentation

## 2024-02-19 DIAGNOSIS — R0602 Shortness of breath: Secondary | ICD-10-CM | POA: Diagnosis not present

## 2024-02-19 DIAGNOSIS — I739 Peripheral vascular disease, unspecified: Secondary | ICD-10-CM | POA: Insufficient documentation

## 2024-02-19 NOTE — Patient Instructions (Signed)
 Medication Instructions:  Your physician recommends that you continue on your current medications as directed. Please refer to the Current Medication list given to you today.  *If you need a refill on your cardiac medications before your next appointment, please call your pharmacy*  Lab Work: No labs ordered today  If you have labs (blood work) drawn today and your tests are completely normal, you will receive your results only by: MyChart Message (if you have MyChart) OR A paper copy in the mail If you have any lab test that is abnormal or we need to change your treatment, we will call you to review the results.  Testing/Procedures: No test ordered today   Follow-Up: At [facility], you and your health needs are our priority.  As part of our continuing mission to provide you with exceptional heart care, our providers are all part of one team.  This team includes your primary Cardiologist (physician) and Advanced Practice Providers or APPs (Physician Assistants and Nurse Practitioners) who all work together to provide you with the care you need, when you need it.  Your next appointment:  6 month(s)  Provider:  [name], MD  We recommend signing up for the patient portal called MyChart.  Sign up information is provided on this After Visit Summary.  MyChart is used to connect with patients for Virtual Visits (Telemedicine).  Patients are able to view lab/test results, encounter notes, upcoming appointments, etc.  Non-urgent messages can be sent to your provider as well.   To learn more about what you can do with MyChart, go to [email or web address].

## 2024-02-19 NOTE — Progress Notes (Addendum)
 "    Office Visit    Patient Name: Derek Hogan Date of Encounter: 02/19/2024  Primary Care Provider:  [name] ORN, MD Primary Cardiologist:  [name], MD  Cardiology APP:  [name], NP  Advanced Heart Failure:  [name], MD   Chief Complaint    79 y.o. male with a history of CAD, carotid arterial disease, hypertension, hyperlipidemia, chronic HFpEF, lower extremity peripheral arterial disease, diabetes, possible TIA (2016), right lower extremity venous insufficiency, lumbar spinal stenosis with radiculopathy, and obesity, who presents for follow-up related to CAD and dyspnea.  Past Medical History   Subjective   Past Medical History:  Diagnosis Date   CAD, ARTERY BYPASS GRAFT    a. 1996 s/p CABG x 4 (LIMA->LAD, VG->D1, VG->OM3, VG->RPDA; b. 12/2016 MV [facility]): abnl - infarct/peri-infarct ischemia; c. 01/2017: Cath: LM 78m/d, LAD 100p/m, D1 90ost, 70p, LCX 80ost/p, 162m, RCA 31m, 75d, RPDA 95ost, VG->RPDA 30p/m, VG->OM3 nl, LIMA->LAD nl, VG->D1 nl; d. 06/2019 MV: EF 52%, infarct, no isch; e. 01/2024 PET Stress: EF 51%, apical ant/inf/lat/inf/inlat infarct. No ischemia.   Carotid arterial disease    a. s/p remote R CEA (Dr. Cathlyn Hint); b. 07/2023 U/S: 1-39% bilat ICA stenoses.   Diastolic dysfunction    a. 05/2021 Echo: EF 60-65%, no rwma, GrI DD, nl RV fxn; b. 06/2023 Echo: EF of 55-60% without regional wall motion abnormalities, mild LVH, grade 2 diastolic dysfunction, normal RV function, mild left atrial enlargement, and mild MR/AI.   Dyslipidemia    Erectile dysfunction    GERD (gastroesophageal reflux disease)    Heart attack (HCC) 1996   HYPERLIPIDEMIA-MIXED 06/09/2008   Hypertension    HYPERTENSION, UNSPECIFIED 06/09/2008   Orthostatic hypotension    Palpitations    a. 04/2021 Zio: Predominantly RSR @ 77 (60-144), 5 SVT runs - fastest 144, longest 20 beats. Occas PVCs - 5%.   Prostate cancer (HCC)    PVD (peripheral vascular  disease)    a. 02/2014 s/p DBA/stent to prox R popliteal; b. 06/2021 orbital atherectomy and DCBA to L popliteal (90%). Otw moderate, nonobs PAD; c. 12/2022 ABIs/Duplex: patent L popliteal; d. 01/2024 Duplex: RDFA & SFA 50-74%, L SFA 30-49%, LPop 50-74%.   Transient ischemic attack (TIA)    Type I diabetes mellitus (HCC) dx'd 08/20/1966   Viral gastroenteritis    04/19/2010 improved   Weakness    Past Surgical History:  Procedure Laterality Date   ABDOMINAL AORTOGRAM W/LOWER EXTREMITY N/A 06/07/2021   Procedure: ABDOMINAL AORTOGRAM W/LOWER EXTREMITY;  Surgeon: Cage Deatrice LABOR, MD;  Location: [facility] INVASIVE CV LAB;  Service: Cardiovascular;  Laterality: N/A;   CARDIAC CATHETERIZATION  1996; ~ 2010   CAROTID ENDARTERECTOMY Right    CATARACT EXTRACTION, BILATERAL Bilateral    COLONOSCOPY WITH PROPOFOL  N/A 06/07/2020   Procedure: COLONOSCOPY WITH PROPOFOL ;  Surgeon: Maryruth Ole DASEN, MD;  Location: [facility] ENDOSCOPY;  Service: Endoscopy;  Laterality: N/A;   CORONARY ANGIOPLASTY     CORONARY ARTERY BYPASS GRAFT  1996   CABG X4   ESOPHAGOGASTRODUODENOSCOPY N/A 06/07/2020   Procedure: ESOPHAGOGASTRODUODENOSCOPY (EGD);  Surgeon: Maryruth Ole DASEN, MD;  Location: [facility] ENDOSCOPY;  Service: Endoscopy;  Laterality: N/A;   LEFT HEART CATH AND CORS/GRAFTS ANGIOGRAPHY N/A 01/11/2017   Procedure: LEFT HEART CATH AND CORS/GRAFTS ANGIOGRAPHY;  Surgeon: Fuel Toribio SAUNDERS, MD;  Location: [facility] INVASIVE CV LAB;  Service: Cardiovascular;  Laterality: N/A;   MOHS SURGERY     PERIPHERAL VASCULAR ATHERECTOMY  06/07/2021   Procedure: PERIPHERAL VASCULAR  ATHERECTOMY;  Surgeon: Darron Deatrice LABOR, MD;  Location: [facility] INVASIVE CV LAB;  Service: Cardiovascular;;   PERIPHERAL VASCULAR CATHETERIZATION N/A 12/15/2014   Procedure: Abdominal Aortogram;  Surgeon: Deatrice LABOR Darron, MD;  Location: [facility] INVASIVE CV LAB;  Service: Cardiovascular;  Laterality: N/A;   REFRACTIVE SURGERY Right    before cataract OR     Allergies  Allergies[1]     History of Present Illness      79 y.o. y/o male with above past medical history including CAD, carotid arterial disease, hypertension, hyperlipidemia, lower extremity peripheral arterial disease, chronic HFpEF, diabetes, possible TIA in 2016, right lower extremity venous insufficiency, lumbar spinal stenosis with radiculopathy, and obesity.  He previously underwent CABG x4 in 1996.  He quit smoking at the time of his bypass surgery.  He previously underwent right carotid endarterectomy and has been followed with serial ultrasounds over the years.  In the setting of lower extremity arterial disease, he underwent drug-coated balloon angioplasty and stenting of the proximal right popliteal in January 2016.  Most recent diagnostic catheterization was performed December 2018 revealing 4 of 4 patent grafts with severe native multivessel disease, and he was medically managed.  Stress testing in 2021 was low risk with prior infarct but no ischemia.   In the setting of left foot pain and small ulceration of the left big toe April 2023, he underwent angiography in May 2023 revealing a heavily calcified 90% stenosis within the left above-the-knee popliteal artery and otherwise moderate, nonobstructive disease bilaterally.  Popliteal artery was treated with orbital atherectomy and drug-coated balloon angioplasty.  In 2023, he was evaluated at the [facility] with brain MRI due to dizziness.  This showed no acute infarct.  He was noted to have chronic right thalamus and left cerebellar lacunar infarcts in addition to microvascular ischemic disease with scattered cerebral microhemorrhages suggestive of amyloid angiopathy.  CTA of the head was performed at the [facility] in the setting of ongoing dizziness which showed significant stenosis in the right vertebral artery but the left vertebral artery was dominant with no obstructive disease and he has been conservatively managed.  In May 2025, he was  evaluated due to complaints of dyspnea and fatigue.  An echocardiogram was performed and showed an EF of 55-60% without regional wall motion abnormalities, mild LVH, grade 2 diastolic dysfunction, normal RV function, mild left atrial enlargement, and mild MR/AI.  Subsequent carotid Doppler showed mild nonobstructive disease bilaterally.  In June 2025, he was seen in the emergency department due to loss of consciousness in the setting of hypoglycemia.  CT of the head was unremarkable.  His insulin pump was adjusted.  In the setting of ongoing dyspnea on exertion with concern for anginal equivalent, Lexiscan  PET stress testing was performed in December 2025, and showed apical anterior, inferior, inferolateral, and lateral infarct without evidence of ischemia.  EF was 51%.  Overall, findings felt to be similar to prior stress testing.  Radiology overread showed interstitial lung disease with recommendation for dedicated high-resolution chest CT.       Patient was seen by [facility] neurology earlier this month in the setting of intermittent lightheadedness and dizziness which was previous evaluated by ENT and not felt to be vestibular in origin.  Neuro feels symptoms are likely multifactorial in the setting of prior several small CVAs, orthostatic hypotension, and autonomic dysregulation.  He denies any lightheadedness today.  He notes that his activities are limited due to chronic back pain and that that is a bigger issue  when it comes to ambulation and then dyspnea.  Overall, he thinks his dyspnea on exertion is stable.  He is followed by pulmonology at the [facility] and his wife forwarded prior CT results to his physician there.  They do not think there is any current plan for repeat imaging.  He denies chest pain, palpitations, PND, orthopnea, dizziness, syncope, or early satiety.  He has chronic mild right lower extremity swelling for which he wears a compression sock. Objective   Home Medications    Current  Outpatient Medications  Medication Sig Dispense Refill   amLODipine (NORVASC) 10 MG tablet Take 10 mg by mouth daily.     aspirin  81 MG tablet Take 81 mg by mouth at bedtime.     atorvastatin  (LIPITOR ) 80 MG tablet Take 1 tablet (80 mg total) by mouth daily.     bisacodyl (DULCOLAX) 5 MG EC tablet Take 5 mg by mouth daily as needed.     Calcium  Carb-Cholecalciferol 600-10 MG-MCG TABS Take 2 tablets by mouth daily.     Calcium -Magnesium-Zinc (CAL-MAG-ZINC PO) Take 1 tablet by mouth daily.     carvedilol  (COREG ) 6.25 MG tablet Take 1 tablet (6.25 mg total) by mouth 2 (two) times daily. 180 tablet 3   ezetimibe (ZETIA) 10 MG tablet Take 10 mg by mouth daily.     fluticasone -salmeterol (WIXELA INHUB) 250-50 MCG/ACT AEPB Inhale 1 puff into the lungs in the morning and at bedtime. 60 each 2   glucagon 1 MG injection 1 mg once as needed (low blood glucose).     insulin aspart (NOVOLOG) 100 unit/mL injection Inject into the skin continuous. INSULIN PUMP     nitroGLYCERIN  (NITROSTAT ) 0.4 MG SL tablet Place 0.4 mg under the tongue every 5 (five) minutes as needed for chest pain.      omeprazole (PRILOSEC) 20 MG capsule Take 20 mg by mouth every morning.      sertraline (ZOLOFT) 100 MG tablet Take 100 mg by mouth daily.     tamsulosin (FLOMAX) 0.4 MG CAPS capsule Take 0.4 mg by mouth.     Tiotropium Bromide-Olodaterol 2.5-2.5 MCG/ACT AERS Inhale into the lungs.     torsemide  (DEMADEX ) 20 MG tablet Take 20 mg by mouth every other day.     traZODone (DESYREL) 50 MG tablet Take 25 mg by mouth at bedtime.     No current facility-administered medications for this visit.     Physical Exam    VS:  BP (!) 128/56 (BP Location: Left Arm, Patient Position: Sitting, Cuff Size: Normal)   Pulse 72   Ht 6' (1.829 m)   Wt 244 lb (110.7 kg)   SpO2 98%   BMI 33.09 kg/m  , BMI Body mass index is 33.09 kg/m.          GEN: Well nourished, well developed, in no acute distress. HEENT: normal. Neck: Supple, no  JVD, or masses.  Right carotid bruit present. Cardiac: RRR, 1/6 systolic murmur at the upper sternal borders, no rubs or gallops. No clubbing, cyanosis, edema.  Radials 2+/PT 1+ and equal bilaterally.  Respiratory:  Respirations regular and unlabored, clear to auscultation bilaterally. GI: Soft, nontender, nondistended, BS + x 4. MS: no deformity or atrophy. Skin: warm and dry, no rash. Neuro:  Strength and sensation are intact. Psych: Normal affect.  Accessory Clinical Findings     Lab Results  Component Value Date   WBC 10.6 (H) 02/01/2022   HGB 12.2 (L) 02/01/2022   HCT 37.5 (L) 02/01/2022  MCV 92.8 02/01/2022   PLT 232 02/01/2022   Lab Results  Component Value Date   CREATININE 0.96 12/30/2023   BUN 17 12/30/2023   NA 137 12/30/2023   K 4.7 12/30/2023   CL 100 12/30/2023   CO2 24 06/29/2021   Lab Results  Component Value Date   ALT 23 03/16/2015   AST 30 12/30/2023   ALKPHOS 71 12/30/2023   BILITOT 0.4 12/30/2023   Lab Results  Component Value Date   CHOL 116 07/08/2014   HDL 43 07/08/2014   LDLCALC 63 07/08/2014   TRIG 51 07/08/2014   CHOLHDL 2.7 07/08/2014    Lab Results  Component Value Date   HGBA1C 7.8 (H) 10/09/2012   Lab Results  Component Value Date   TSH 1.820 06/28/2023       Assessment & Plan    1.  Coronary artery disease/dyspnea on exertion: Status post CABG x 4 1996 with diagnostic catheterization 2018 showing 4 of 4 patent grafts with severe multivessel native disease.  In the setting of reduced activity tolerance and dyspnea on exertion, Lexiscan  PET stress testing was performed in December 2025 and showed apical inferior, inferior, inferolateral, and lateral infarct without ischemia.  EF was 51%.  Overall, findings similar to prior nuclear imaging (2021).  Echo earlier this year showed normal LV function with grade 2 diastolic dysfunction and mild MR/AI.  Patient does not experience chest pain.  He notes that back pain is more of a factor  when it comes on with activity than dyspnea at this time and overall that his dyspnea on exertion is stable.  He remains on aspirin , statin, beta-blocker, Zetia, and amlodipine therapy.  No changes today.  2.  Primary hypertension: Stable on beta-blocker, calcium  channel blocker diet.  3.  Hyperlipidemia: Followed at the [facility].  He is on atorvastatin  70.  4.  Chronic HFpEF: As above, echo earlier this year showed normal LV function grade 2 diastolic dysfunction and mild MR/AI.  Heart rate and blood pressure stable today he appears euvolemic on examination.  Weight is overall stable.  Continue beta-blocker, calcium  channel blocker, and torsemide  20 mg, which he uses every other day.  5.  Peripheral arterial disease: Status post prior bilateral interventions with stable lower arterial duplex in December 2025 with plan for follow-up again next year.  He remains on aspirin , Zetia, and statin therapy.  6.  Carotid arterial disease: Carotid ultrasound in June 2025 with 1 to 39% bilateral internal carotid artery stenoses.  He remains on aspirin  and statin therapy.  He would like to think about the timing of his carotid ultrasound with his lower extremity imaging therefore, would plan to repeat in December 2026.  7.  Type 2 diabetes mellitus: Followed at the [facility] remains on NovoLog via insulin pump.  8.  COPD: Patient with history of COPD/emphysema and is followed by pulmonology at the [facility] in [facility].  Radiology over read of recent PET stress with suggestion of interstitial lung disease and recommendation for dedicated high-resolution CT.  Patient plans to follow-up with his pulmonologist to discuss further.  Lung exam is normal today.  9.  Disposition: Follow-up in 6 months or sooner if necessary.  Lonni Meager, NP 02/19/2024, 8:08 AM     [1]  Allergies Allergen Reactions   Simvastatin Swelling   Lisinopril  Other (See Comments)    Unknown   "

## 2024-02-25 ENCOUNTER — Ambulatory Visit: Admitting: Pain Medicine

## 2024-02-25 ENCOUNTER — Encounter: Payer: Self-pay | Admitting: Pain Medicine

## 2024-02-25 ENCOUNTER — Ambulatory Visit
Admission: RE | Admit: 2024-02-25 | Discharge: 2024-02-25 | Disposition: A | Discharge status: Home / Self Care | Source: Ambulatory Visit | Attending: Pain Medicine | Admitting: Pain Medicine

## 2024-02-25 VITALS — BP 157/71 | HR 75 | Temp 97.2°F | Resp 20 | Ht 72.0 in | Wt 244.0 lb

## 2024-02-25 DIAGNOSIS — M4310 Spondylolisthesis, site unspecified: Secondary | ICD-10-CM | POA: Diagnosis present

## 2024-02-25 DIAGNOSIS — M4316 Spondylolisthesis, lumbar region: Secondary | ICD-10-CM | POA: Insufficient documentation

## 2024-02-25 DIAGNOSIS — M545 Low back pain, unspecified: Secondary | ICD-10-CM

## 2024-02-25 DIAGNOSIS — M4856XS Collapsed vertebra, not elsewhere classified, lumbar region, sequela of fracture: Secondary | ICD-10-CM | POA: Insufficient documentation

## 2024-02-25 DIAGNOSIS — S32020S Wedge compression fracture of second lumbar vertebra, sequela: Secondary | ICD-10-CM | POA: Insufficient documentation

## 2024-02-25 DIAGNOSIS — G8929 Other chronic pain: Secondary | ICD-10-CM

## 2024-02-25 DIAGNOSIS — M47816 Spondylosis without myelopathy or radiculopathy, lumbar region: Secondary | ICD-10-CM

## 2024-02-25 DIAGNOSIS — R937 Abnormal findings on diagnostic imaging of other parts of musculoskeletal system: Secondary | ICD-10-CM

## 2024-02-25 DIAGNOSIS — S32040S Wedge compression fracture of fourth lumbar vertebra, sequela: Secondary | ICD-10-CM | POA: Diagnosis present

## 2024-02-25 DIAGNOSIS — R69 Illness, unspecified: Secondary | ICD-10-CM | POA: Insufficient documentation

## 2024-02-25 MED ORDER — PENTAFLUOROPROP-TETRAFLUOROETH EX AERO
1.0000 | INHALATION_SPRAY | Freq: Once | CUTANEOUS | Status: AC
  Administered 2024-02-25: 1 via TOPICAL

## 2024-02-25 MED ORDER — FENTANYL CITRATE (PF) 100 MCG/2ML IJ SOLN
INTRAMUSCULAR | Status: AC
  Filled 2024-02-25: qty 2

## 2024-02-25 MED ORDER — ROPIVACAINE HCL 2 MG/ML IJ SOLN
INTRAMUSCULAR | Status: AC
  Filled 2024-02-25: qty 20

## 2024-02-25 MED ORDER — TRIAMCINOLONE ACETONIDE 40 MG/ML IJ SUSP
INTRAMUSCULAR | Status: AC
  Filled 2024-02-25: qty 2

## 2024-02-25 MED ORDER — LIDOCAINE HCL 2 % IJ SOLN
INTRAMUSCULAR | Status: AC
  Filled 2024-02-25: qty 20

## 2024-02-25 MED ORDER — FENTANYL CITRATE (PF) 100 MCG/2ML IJ SOLN: 25.0000 ug | INTRAMUSCULAR | Status: DC | PRN

## 2024-02-25 MED ORDER — MIDAZOLAM HCL 5 MG/5ML IJ SOLN
0.5000 mg | Freq: Once | INTRAMUSCULAR | Status: AC
  Administered 2024-02-25: 2 mg via INTRAVENOUS

## 2024-02-25 MED ORDER — ROPIVACAINE HCL 2 MG/ML IJ SOLN
18.0000 mL | Freq: Once | INTRAMUSCULAR | Status: AC
  Administered 2024-02-25: 18 mL via PERINEURAL

## 2024-02-25 MED ORDER — LIDOCAINE HCL 2 % IJ SOLN
20.0000 mL | Freq: Once | INTRAMUSCULAR | Status: AC
  Administered 2024-02-25: 400 mg

## 2024-02-25 MED ORDER — MIDAZOLAM HCL 5 MG/5ML IJ SOLN
INTRAMUSCULAR | Status: AC
  Filled 2024-02-25: qty 5

## 2024-02-25 MED ORDER — TRIAMCINOLONE ACETONIDE 40 MG/ML IJ SUSP
80.0000 mg | Freq: Once | INTRAMUSCULAR | Status: AC
  Administered 2024-02-25: 80 mg

## 2024-02-25 NOTE — Progress Notes (Signed)
 PROVIDER NOTE: Interpretation of information contained herein should be left to medically-trained personnel. Specific patient instructions are provided elsewhere under Patient Instructions section of medical record. This document was created in part using STT-dictation technology, any transcriptional errors that may result from this process are unintentional.  Patient: Derek Hogan Type: Established DOB: Nov 24, 1945 MRN: [id number] PCP: [name] ORN, MD  Service: Procedure DOS: 02/25/2024 Setting: Ambulatory Location: Ambulatory outpatient facility Delivery: Face-to-face Provider: Eric DELENA Como, MD Specialty: Interventional Pain Management Specialty designation: 09 Location: Outpatient facility Ref. Prov.: [name] ORN, MD       Interventional Therapy   Type: Lumbar Facet, Medial Branch Block(s) (w/ fluoroscopic mapping) #1  Laterality: Bilateral  Level: L4, L5, and S1 Medial Branch/Dorsal Rami Level(s). Injecting these levels blocks the L5-S1 lumbar facet joints. Imaging: Fluoroscopic guidance Spinal ([id number]) Anesthesia: Local anesthesia (1-2% Lidocaine ) Anxiolysis: IV Versed  2.0 mg            Analgesia: No Sedation None required. No Fentanyl  administered.         DOS: 02/25/2024 Performed by: Eric DELENA Como, MD  Primary Purpose: Diagnostic/Therapeutic Indications: Low back pain severe enough to impact quality of life or function. 1. Chronic low back pain (1ry area of Pain) (Bilateral) w/o sciatica   2. Spondylosis without myelopathy or radiculopathy, lumbar region   3. Spondylolisthesis of lumbar region with dynamic instability (L1-2, L2-3, L3-4)   4. Lumbar facet joint hypertrophy (Multilevel) (Bilateral) (L3-4, L4-5, L5-S1)   5. Low back pain of over 3 months duration   6. Grade 1 Retrolisthesis of lumbar spine (L2/L3 & L3/L4)   7. Grade 1 Anterolisthesis of lumbar spine (79mm/4mm/6mm) (L1/L2)   8. Compression fracture of L2, sequela   9.  Compression fracture of L4 lumbar vertebra, sequela   10. Compression fracture of L5 lumbar vertebra, sequela   11. Abnormal MRI, lumbar spine (01/13/2024)    NAS-11 Pain score:   Pre-procedure: 1 /10   Post-procedure: 0-No pain/10     Position / Prep / Materials:  Position: Prone  Prep solution: ChloraPrep (2% chlorhexidine gluconate and 70% isopropyl alcohol) Area Prepped: Posterolateral Lumbosacral Spine (Wide prep: From the lower border of the scapula down to the end of the tailbone and from flank to flank.)  Materials:  Tray: Block Needle(s):  Type: Spinal  Gauge (G): 22  Length: 5-in Qty: 4     H&P (Pre-op Assessment):  Derek Hogan is a 79 y.o. (year old), male patient, seen today for interventional treatment. He  has a past surgical history that includes Refractive surgery (Right); Carotid endarterectomy (Right); Coronary artery bypass graft (1996); Cataract extraction, bilateral (Bilateral); Cardiac catheterization (1996; ~ 2010); Coronary angioplasty; Cardiac catheterization (N/A, 12/15/2014); Mohs surgery; LEFT HEART CATH AND CORS/GRAFTS ANGIOGRAPHY (N/A, 01/11/2017); Esophagogastroduodenoscopy (N/A, 06/07/2020); Colonoscopy with propofol  (N/A, 06/07/2020); ABDOMINAL AORTOGRAM W/LOWER EXTREMITY (N/A, 06/07/2021); and PERIPHERAL VASCULAR ATHERECTOMY (06/07/2021). Derek Hogan has a current medication list which includes the following prescription(s): amlodipine, aspirin , atorvastatin , bisacodyl, calcium  carb-cholecalciferol, calcium -magnesium-zinc, carvedilol , ezetimibe, fluticasone -salmeterol, glucagon, insulin aspart, nitroglycerin , omeprazole, sertraline, tamsulosin, tiotropium bromide-olodaterol, torsemide , and trazodone, and the following Facility-Administered Medications: fentanyl . His primarily concern today is the Back Pain  Initial Vital Signs:  Pulse/HCG Rate: 75ECG Heart Rate: 72 (nsr) Temp: (!) 97.2 F (36.2 C) Resp: 16 BP: 134/73 SpO2: 100 %  BMI: Estimated body mass  index is 33.09 kg/m as calculated from the following:   Height as of this encounter: 6' (1.829 m).   Weight as of this encounter: 244 lb (  110.7 kg).  Risk Assessment: Allergies: Reviewed. He is allergic to simvastatin and lisinopril .  Allergy Precautions: None required Coagulopathies: Reviewed. None identified.  Blood-thinner therapy: None at this time Active Infection(s): Reviewed. None identified. Derek Hogan is afebrile  Site Confirmation: Derek Hogan was asked to confirm the procedure and laterality before marking the site Procedure checklist: Completed Consent: Before the procedure and under the influence of no sedative(s), amnesic(s), or anxiolytics, the patient was informed of the treatment options, risks and possible complications. To fulfill our ethical and legal obligations, as recommended by the American Medical Association's Code of Ethics, I have informed the patient of my clinical impression; the nature and purpose of the treatment or procedure; the risks, benefits, and possible complications of the intervention; the alternatives, including doing nothing; the risk(s) and benefit(s) of the alternative treatment(s) or procedure(s); and the risk(s) and benefit(s) of doing nothing. The patient was provided information about the general risks and possible complications associated with the procedure. These may include, but are not limited to: failure to achieve desired goals, infection, bleeding, organ or nerve damage, allergic reactions, paralysis, and death. In addition, the patient was informed of those risks and complications associated to Spine-related procedures, such as failure to decrease pain; infection (i.e.: Meningitis, epidural or intraspinal abscess); bleeding (i.e.: epidural hematoma, subarachnoid hemorrhage, or any other type of intraspinal or peri-dural bleeding); organ or nerve damage (i.e.: Any type of peripheral nerve, nerve root, or spinal cord injury) with subsequent  damage to sensory, motor, and/or autonomic systems, resulting in permanent pain, numbness, and/or weakness of one or several areas of the body; allergic reactions; (i.e.: anaphylactic reaction); and/or death. Furthermore, the patient was informed of those risks and complications associated with the medications. These include, but are not limited to: allergic reactions (i.e.: anaphylactic or anaphylactoid reaction(s)); adrenal axis suppression; blood sugar elevation that in diabetics may result in ketoacidosis or comma; water retention that in patients with history of congestive heart failure may result in shortness of breath, pulmonary edema, and decompensation with resultant heart failure; weight gain; swelling or edema; medication-induced neural toxicity; particulate matter embolism and blood vessel occlusion with resultant organ, and/or nervous system infarction; and/or aseptic necrosis of one or more joints. Finally, the patient was informed that Medicine is not an exact science; therefore, there is also the possibility of unforeseen or unpredictable risks and/or possible complications that may result in a catastrophic outcome. The patient indicated having understood very clearly. We have given the patient no guarantees and we have made no promises. Enough time was given to the patient to ask questions, all of which were answered to the patient's satisfaction. Mr. Matters has indicated that he wanted to continue with the procedure. Attestation: I, the ordering provider, attest that I have discussed with the patient the benefits, risks, side-effects, alternatives, likelihood of achieving goals, and potential problems during recovery for the procedure that I have provided informed consent. Date  Time: 02/25/2024  8:12 AM  Pre-Procedure Preparation:  Monitoring: As per clinic protocol. Respiration, ETCO2, SpO2, BP, heart rate and rhythm monitor placed and checked for adequate function Safety Precautions:  Patient was assessed for positional comfort and pressure points before starting the procedure. Time-out: I initiated and conducted the Time-out before starting the procedure, as per protocol. The patient was asked to participate by confirming the accuracy of the Time Out information. Verification of the correct person, site, and procedure were performed and confirmed by me, the nursing staff, and the patient. Time-out conducted as  per Joint Commission's Universal Protocol (UP.01.01.01). Time: 0904 Start Time: 0904 hrs.  Description of Procedure:          Laterality: (see above) Targeted Levels: (see above)  Safety Precautions: Aspiration looking for blood return was conducted prior to all injections. At no point did we inject any substances, as a needle was being advanced. Before injecting, the patient was told to immediately notify me if he was experiencing any new onset of ringing in the ears, or metallic taste in the mouth. No attempts were made at seeking any paresthesias. Safe injection practices and needle disposal techniques used. Medications properly checked for expiration dates. SDV (single dose vial) medications used. After the completion of the procedure, all disposable equipment used was discarded in the proper designated medical waste containers. Local Anesthesia: Protocol guidelines were followed. The patient was positioned over the fluoroscopy table. The area was prepped in the usual manner. The time-out was completed. The target area was identified using fluoroscopy. A 12-in long, straight, sterile hemostat was used with fluoroscopic guidance to locate the targets for each level blocked. Once located, the skin was marked with an approved surgical skin marker. Once all sites were marked, the skin (epidermis, dermis, and hypodermis), as well as deeper tissues (fat, connective tissue and muscle) were infiltrated with a small amount of a short-acting local anesthetic, loaded on a 10cc  syringe with a 25G, 1.5-in  Needle. An appropriate amount of time was allowed for local anesthetics to take effect before proceeding to the next step. Local Anesthetic: Lidocaine  2.0% The unused portion of the local anesthetic was discarded in the proper designated containers. Technical description of process:  Medial Branch  Dorsal Rami Nerve Block (MBB):  Neuroanatomy note: Each lumbar facet joint receives dual innervation from medial branches arising from the posterior primary rami at the same level and one level above. The target for each lumbar medial branch is the junction of the ipsilateral superior articular and transverse process of the lower vertebral body. (i.e.: The L4-L5 facet joint is innervated by the L4 medial branch [located at L5] and the L3 medial branch [located at L4]. Blocking the L4 Medial Branch is therefore achieved by injecting at the junction of the ipsilateral superior articular and transverse process of the lower vertebral body [L5].).  Exception: The exception to the above rule is the L5-S1 facet joint which has triple innervation requiring the L4 medial branch, as well as the L5 and the S1 Dorsal Rami(s) to be blocked to fully denervate the joint.  Under fluoroscopic guidance, a needle was inserted until contact was made with os over the target area. After negative aspiration, 0.5 mL of the nerve block solution was injected without difficulty or complication. Paresthesia were avoided during injection. The needle(s) were removed intact and without complication.  Once the entire procedure was completed, the treated area was cleaned, making sure to leave some of the prepping solution back to take advantage of its long term bactericidal properties.         Illustration of the posterior view of the lumbar spine and the posterior neural structures. Laminae of L2 through S1 are labeled. DPRL5, dorsal primary ramus of L5; DPRS1, dorsal primary ramus of S1; DPR3, dorsal primary  ramus of L3; FJ, facet (zygapophyseal) joint L3-L4; I, inferior articular process of L4; LB1, lateral branch of dorsal primary ramus of L1; IAB, inferior articular branches from L3 medial branch (supplies L4-L5 facet joint); IBP, intermediate branch plexus; MB3, medial branch of dorsal  primary ramus of L3; NR3, third lumbar nerve root; S, superior articular process of L5; SAB, superior articular branches from L4 (supplies L4-5 facet joint also); TP3, transverse process of L3.   Facet Joint Innervation (* possible contribution)  L1-2 T12, L1 (L2*)  Medial Branch  L2-3 L1, L2 (L3*)                     L3-4 L2, L3 (L4*)                     L4-5 L3, L4 (L5*)                     L5-S1 L4, L5, S1                        Vitals:   02/25/24 0903 02/25/24 0908 02/25/24 0913 02/25/24 0914  BP: 120/82 136/83 130/75 (!) 125/91  Pulse:      Resp: 14 17 15 20   Temp:      TempSrc:      SpO2: 100% 100% 100% 100%  Weight:      Height:         End Time: 0913 hrs.  Imaging Guidance (Spinal):         Type of Imaging Technique: Fluoroscopy Guidance (Spinal) Indication(s): Fluoroscopy guidance for needle placement to enhance accuracy in procedures requiring precise needle localization for targeted delivery of medication in or near specific anatomical locations not easily accessible without such real-time imaging assistance. Exposure Time: Please see nurses notes. Contrast: None used. Fluoroscopic Guidance: I was personally present during the use of fluoroscopy. Tunnel Vision Technique used to obtain the best possible view of the target area. Parallax error corrected before commencing the procedure. Direction-depth-direction technique used to introduce the needle under continuous pulsed fluoroscopy. Once target was reached, antero-posterior, oblique, and lateral fluoroscopic projection used confirm needle placement in all planes. Images permanently stored in EMR. Interpretation: No contrast  injected. I personally interpreted the imaging intraoperatively. Adequate needle placement confirmed in multiple planes. Permanent images saved into the patient's record.  Post-operative Assessment:  Post-procedure Vital Signs:  Pulse/HCG Rate: 7570 Temp: (!) 97.2 F (36.2 C) Resp: 20 BP: (!) 125/91 SpO2: 100 %  EBL: None  Complications: No immediate post-treatment complications observed by team, or reported by patient.  Note: The patient tolerated the entire procedure well. A repeat set of vitals were taken after the procedure and the patient was kept under observation following institutional policy, for this type of procedure. Post-procedural neurological assessment was performed, showing return to baseline, prior to discharge. The patient was provided with post-procedure discharge instructions, including a section on how to identify potential problems. Should any problems arise concerning this procedure, the patient was given instructions to immediately contact us , at any time, without hesitation. In any case, we plan to contact the patient by telephone for a follow-up status report regarding this interventional procedure.  Comments:  No additional relevant information.  Plan of Care (POC)  Orders:  Orders Placed This Encounter  Procedures   LUMBAR FACET(MEDIAL BRANCH NERVE BLOCK) MBNB    Scheduling Instructions:     Procedure: Lumbar facet block (AKA.: Lumbosacral medial branch nerve block)     Side: Bilateral     Level: L3-4, L4-5, and L5-S1 Facets (L2, L3, L4, L5, and S1 Medial Branch Nerves)     Sedation: Patient's choice.     Date: 02/25/2024  Where will this procedure be performed?:   [facility]   DG [facility] C-ARM 1-60 MIN NO REPORT    Intraoperative interpretation by procedural physician at [facility].    Standing Status:   Standing    Number of Occurrences:   1    Reason for exam::   Assistance in needle guidance and placement for procedures  requiring needle placement in or near specific anatomical locations not easily accessible without such assistance.   Informed Consent Details: Physician/Practitioner Attestation; Transcribe to consent form and obtain patient signature    Nursing Order: Transcribe to consent form and obtain patient signature. Note: Always confirm laterality of pain with Mr. Creelman, before procedure.    Physician/Practitioner attestation of informed consent for procedure/surgical case:   I, the physician/practitioner, attest that I have discussed with the patient the benefits, risks, side effects, alternatives, likelihood of achieving goals and potential problems during recovery for the procedure that I have provided informed consent.    Procedure:   Lumbar Facet Block  under fluoroscopic guidance    Physician/Practitioner performing the procedure:   Lalani Winkles A. Tanya MD    Indication/Reason:   Low Back Pain, with our without leg pain, due to Facet Joint Arthralgia (Joint Pain) Spondylosis (Arthritis of the Spine), without myelopathy or radiculopathy (Nerve Damage).   Provide equipment / supplies at bedside    Procedure tray: Block Tray (Disposable  single use) Skin infiltration needle: Regular 1.5-in, 25-G, (x1) Block Needle type: Spinal Amount/quantity: 4 Size: Medium (5-inch) Gauge: 22G    Standing Status:   Standing    Number of Occurrences:   1    Specify:   Block Tray   Saline lock IV    Have LR [phone] available and administer at 125 mL/hr if patient becomes hypotensive.    Standing Status:   Standing    Number of Occurrences:   1     Opioid Analgesic: None MME/day: 0 mg/day    Medications ordered for procedure: Meds ordered this encounter  Medications   lidocaine  (XYLOCAINE ) 2 % (with pres) injection 400 mg   pentafluoroprop-tetrafluoroeth (GEBAUERS) aerosol 1 Application   midazolam  (VERSED ) 5 MG/5ML injection 0.5-2 mg    Make sure Flumazenil is available in the pyxis when using  this medication. If oversedation occurs, administer 0.2 mg IV over 15 sec. If after 45 sec no response, administer 0.2 mg again over 1 min; may repeat at 1 min intervals; not to exceed 4 doses (1 mg)   fentaNYL  (SUBLIMAZE ) injection 25-50 mcg    Make sure Narcan is available in the pyxis when using this medication. In the event of respiratory depression (RR< 8/min): Titrate NARCAN (naloxone) in increments of 0.1 to 0.2 mg IV at 2-3 minute intervals, until desired degree of reversal.   ropivacaine  (PF) 2 mg/mL (0.2%) (NAROPIN ) injection 18 mL   triamcinolone  acetonide (KENALOG -40) injection 80 mg   Medications administered: We administered lidocaine , pentafluoroprop-tetrafluoroeth, midazolam , ropivacaine  (PF) 2 mg/mL (0.2%), and triamcinolone  acetonide.  See the medical record for exact dosing, route, and time of administration.    Interventional Therapies  Risk Factors  Considerations  Medical Comorbidities:  COPD  BA  Bronchiectasia  Carotid Stenosis  Hx. TIAs  CAD  Hx. CABG  T2IDDM w/ insulin Pump  HTN  GERD  OSA  Hx. Orthostatic Hypotension  Dizziness  Hx. Syncope  Insulin Pump in-situ  Hx. Prostate & Skin Cancer  PVD     Planned  Pending:  Diagnostic/therapeutic bilateral lumbar facet (L2-S1) MBB #1 (by our practice)    Under consideration:   Therapeutic bilateral lumbar facet MB RFA #2 (first by our practice)    Completed: (Analgesic benefit)1  None at this time   Therapeutic  Palliative (PRN) options:   None established   Completed by other providers:   Therapeutic bilateral L3-S1 Facet Blk x1 (05/12/2020) by Daphne Ozell Lighter, MD ([facility])  Diagnostic left L2-L5 Facet MBB x2 (05/26/2020, 06/02/2020) by Daphne Ozell Lighter, MD ([facility])  Therapeutic left L2-L5 Facet MB RFA x1 (06/23/2020) by Daphne Ozell Lighter, MD ([facility])  Diagnostic right L2-L5 Facet MBB x2 (08/25/2020, 09/08/2020) by Daphne Ozell Lighter, MD ([facility])  Therapeutic right  L2-L5 Facet MB RFA x1 (09/22/2020) by Daphne Ozell Lighter, MD ([facility])  Diagnostic right SI joint block x2 (11/17/2020, 01/12/2021) by Daphne Ozell Lighter, MD ([facility])  Therapeutic L4-5 LESI x1 (04/20/2021) by Daphne Ozell Lighter, MD ([facility])  Diagnostic lumbar spinal cord stimulator trial (09/18/2021) by Aloha BROCKS.Claudene, MD ([facility])   1(Analgesic benefit): Expressed in percentage (%). (Local anesthetic[LA] +/- sedation  L.A.Local Anesthetic  Steroid benefit  Ongoing benefit)      Follow-up plan:   Return in about 2 weeks (around 03/10/2024) for (Face2F), (PPE).     Recent Visits Date Type Provider Dept  01/20/24 Office Visit Tanya Glisson, MD [facility]  12/30/23 Office Visit Tanya Glisson, MD [facility]  Showing recent visits within past 90 days and meeting all other requirements Today's Visits Date Type Provider Dept  02/25/24 Procedure visit Tanya Glisson, MD [facility]  Showing today's visits and meeting all other requirements Future Appointments Date Type Provider Dept  03/11/24 Appointment Tanya Glisson, MD [facility]  Showing future appointments within next 90 days and meeting all other requirements   Disposition: Discharge home  Discharge (Date  Time): 02/25/2024;   hrs.   Primary Care Physician: [name] ORN, MD Location: [facility] Note by: Glisson DELENA Tanya, MD (TTS technology used. I apologize for any typographical errors that were not detected and corrected.) Date: 02/25/2024; Time: 9:21 AM  Disclaimer:  Medicine is not an visual merchandiser. The only guarantee in medicine is that nothing is guaranteed. It is important to note that the decision to proceed with this intervention was based on the information collected from the patient. The Data and conclusions were drawn from the patient's questionnaire, the interview, and the physical examination.  Because the information was provided in large part by the patient, it cannot be guaranteed that it has not been purposely or unconsciously manipulated. Every effort has been made to obtain as much relevant data as possible for this evaluation. It is important to note that the conclusions that lead to this procedure are derived in large part from the available data. Always take into account that the treatment will also be dependent on availability of resources and existing treatment guidelines, considered by other Pain Management Practitioners as being common knowledge and practice, at the time of the intervention. For Medico-Legal purposes, it is also important to point out that variation in procedural techniques and pharmacological choices are the acceptable norm. The indications, contraindications, technique, and results of the above procedure should only be interpreted and judged by a Board-Certified Interventional Pain Specialist with extensive familiarity and expertise in the same exact procedure and technique.

## 2024-02-25 NOTE — Patient Instructions (Signed)
 ______________________________________________________________________    Post-Procedure Discharge Instructions  INSTRUCTIONS Apply ice:  Purpose: This will minimize any swelling and discomfort after procedure.  When: Day of procedure, as soon as you get home. How: Fill a plastic sandwich bag with crushed ice. Cover it with a small towel and apply to injection site. How long: (15 min on, 15 min off) Apply for 15 minutes then remove x 15 minutes.  Repeat sequence on day of procedure, until you go to bed. Apply heat:  Purpose: To treat any soreness and discomfort from the procedure. When: Starting the next day after the procedure. How: Apply heat to procedure site starting the day following the procedure. How long: May continue to repeat daily, until discomfort goes away. Food intake: Start with clear liquids (like water) and advance to regular food, as tolerated.  Physical activities: Keep activities to a minimum for the first 8 hours after the procedure. After that, then as tolerated. Driving: If you have received any sedation, be responsible and do not drive. You are not allowed to drive for 24 hours after having sedation. Blood thinner: (Applies only to those taking blood thinners) You may restart your blood thinner 6 hours after your procedure. Insulin: (Applies only to Diabetic patients taking insulin) As soon as you can eat, you may resume your normal dosing schedule. Infection prevention: Keep procedure site clean and dry. Shower daily and clean area with soap and water.  PAIN DIARY Post-procedure Pain Diary: Extremely important that this be done correctly and accurately. Recorded information will be used to determine the next step in treatment. For the purpose of accuracy, follow these rules: Evaluate only the area treated. Do not report or include pain from an untreated area. For the purpose of this evaluation, ignore all other areas of pain, except for the treated area. After your  procedure, avoid taking a long nap and attempting to complete the pain diary after you wake up. Instead, set your alarm clock to go off every hour, on the hour, for the initial 8 hours after the procedure. Document the duration of the numbing medicine, and the relief you are getting from it. Do not go to sleep and attempt to complete it later. It will not be accurate. If you received sedation, it is likely that you were given a medication that may cause amnesia. Because of this, completing the diary at a later time may cause the information to be inaccurate. This information is needed to plan your care. Follow-up appointment: Keep your post-procedure follow-up evaluation appointment after the procedure (usually 2 weeks for most procedures, 6 weeks for radiofrequencies). DO NOT FORGET to bring you pain diary with you.   EXPECT... (What should I expect to see with my procedure?) From numbing medicine (AKA: Local Anesthetics): Numbness or decrease in pain. You may also experience some weakness, which if present, could last for the duration of the local anesthetic. Onset: Full effect within 15 minutes of injected. Duration: It will depend on the type of local anesthetic used. On the average, 1 to 8 hours.  From steroids (Applies only if steroids were used): Decrease in swelling or inflammation. Once inflammation is improved, relief of the pain will follow. Onset of benefits: Depends on the amount of swelling present. The more swelling, the longer it will take for the benefits to be seen. In some cases, up to 10 days. Duration: Steroids will stay in the system x 2 weeks. Duration of benefits will depend on multiple posibilities including persistent irritating  factors. Side-effects: If present, they may typically last 2 weeks (the duration of the steroids). Frequent: Cramps (if they occur, drink Gatorade and take over-the-counter Magnesium 450-500 mg once to twice a day); water retention with temporary weight  gain; increases in blood sugar; decreased immune system response; increased appetite. Occasional: Facial flushing (red, warm cheeks); mood swings; menstrual changes. Uncommon: Long-term decrease or suppression of natural hormones; bone thinning. (These are more common with higher doses or more frequent use. This is why we prefer that our patients avoid having any injection therapies in other practices.)  Very Rare: Severe mood changes; psychosis; aseptic necrosis. From procedure: Some discomfort is to be expected once the numbing medicine wears off. This should be minimal if ice and heat are applied as instructed.  CALL IF... (When should I call?) You experience numbness and weakness that gets worse with time, as opposed to wearing off. New onset bowel or bladder incontinence. (Applies only to procedures done in the spine)  Emergency Numbers: Durning business hours (Monday - Thursday, 8:00 AM - 4:00 PM) (Friday, 9:00 AM - 12:00 Noon): [phone] After hours: [phone] NOTE: If you are having a problem and are unable connect with, or to talk to a provider, then go to your nearest urgent care or emergency department. If the problem is serious and urgent, please call 911. ______________________________________________________________________     ______________________________________________________________________    Steroid injections  Common steroids for injections Triamcinolone : Used by many sports medicine physicians for large joint and bursal injections, often combined with a local anesthetic like lidocaine . A study focusing on coccydynia (tailbone pain) found triamcinolone  was more effective than betamethasone, suggesting it may also be preferable for other localized inflammation conditions. Methylprednisolone: A common alternative to triamcinolone  that is also a strong anti-inflammatory. It is available in different formulations, with the acetate suspension being the long-acting  option for intra-articular injections. Dexamethasone : This is a non-particulate steroid, meaning it has a lower risk of tissue damage compared to particulate steroids like triamcinolone  and methylprednisolone. While less common for this specific use, it is an option for targeted injections.   Considerations for physicians Particulate vs. non-particulate steroids: Triamcinolone  and methylprednisolone are particulate, meaning they can clump together. Dexamethasone  is non-particulate. Particulate steroids are often preferred for their longer-lasting effects but carry a theoretical higher risk for certain injections (though this is less of a concern in the costochondral joints). Combined injectate: Corticosteroids are typically mixed with a local anesthetic like lidocaine  to provide both immediate pain relief (from the anesthetic) and longer-term inflammation reduction (from the steroid). Imaging guidance: To ensure accurate placement of the needle and medication, physicians may use ultrasound or fluoroscopic guidance for the injection, especially in complex or refractory cases.   Patient guidance Before undergoing a steroid injection, discuss the options with your physician. They will determine the best steroid, dosage, and procedure for your specific case based on factors like: Severity of your condition History of response to other treatments Your overall health status Experience and preference of the physician  Last  Updated: 10/01/2023 ______________________________________________________________________

## 2024-02-25 NOTE — Progress Notes (Signed)
 Safety precautions to be maintained throughout the outpatient stay will include: orient to surroundings, keep bed in low position, maintain call bell within reach at all times, provide assistance with transfer out of bed and ambulation.

## 2024-02-26 ENCOUNTER — Telehealth: Payer: Self-pay

## 2024-02-26 NOTE — Telephone Encounter (Signed)
 No issues post-procedure.

## 2024-03-11 ENCOUNTER — Encounter: Payer: Self-pay | Admitting: Pain Medicine

## 2024-03-11 ENCOUNTER — Ambulatory Visit: Admitting: Pain Medicine

## 2024-03-11 VITALS — BP 128/107 | HR 68 | Temp 97.3°F | Resp 16 | Ht 71.0 in | Wt 242.0 lb

## 2024-03-11 DIAGNOSIS — M4316 Spondylolisthesis, lumbar region: Secondary | ICD-10-CM | POA: Diagnosis not present

## 2024-03-11 DIAGNOSIS — M47816 Spondylosis without myelopathy or radiculopathy, lumbar region: Secondary | ICD-10-CM

## 2024-03-11 DIAGNOSIS — Z09 Encounter for follow-up examination after completed treatment for conditions other than malignant neoplasm: Secondary | ICD-10-CM

## 2024-03-11 DIAGNOSIS — G8929 Other chronic pain: Secondary | ICD-10-CM

## 2024-03-11 DIAGNOSIS — M4310 Spondylolisthesis, site unspecified: Secondary | ICD-10-CM

## 2024-03-11 DIAGNOSIS — M5459 Other low back pain: Secondary | ICD-10-CM | POA: Insufficient documentation

## 2024-03-11 DIAGNOSIS — M545 Low back pain, unspecified: Secondary | ICD-10-CM

## 2024-03-11 NOTE — Patient Instructions (Signed)
 " ______________________________________________________________________    Procedure instructions  Stop blood-thinners  Do not eat or drink fluids (other than water ) for 8 hours before your procedure  No water  for 2 hours before your procedure  Take your blood pressure medicine with a sip of water   Arrive 30 minutes before your appointment  If sedation is planned, bring suitable driver. [place], [place], & public transportation are NOT APPROVED)  Carefully read the Preparing for your procedure detailed instructions  If you have questions call us  at [phone]  Procedure appointments are for procedures only.   NO medication refills or new problem evaluations will be done on procedure days.   Only the scheduled, pre-approved procedure and side will be done.   ______________________________________________________________________     ______________________________________________________________________    Preparing for your procedure  Appointments: If you think you may not be able to keep your appointment, call 24-48 hours in advance to cancel. We need time to make it available to others.  Procedure visits are for procedures only. During your procedure appointment there will be: NO Prescription Refills*. NO medication changes or discussions*. NO discussion of disability issues*. NO unrelated pain problem evaluations*. NO evaluations to order other pain procedures*. *These will be addressed at a separate and distinct evaluation encounter on the provider's evaluation schedule and not during procedure days.  Instructions: Food intake: Avoid eating anything solid for at least 8 hours prior to your procedure. Clear liquid intake: You may take clear liquids such as water  up to 2 hours prior to your procedure. (No carbonated drinks. No soda.) Transportation: Unless otherwise stated by your physician, bring a driver. (Driver cannot be a [employer]cher, Pharmacist, Community, or any other form of public  transportation.) Morning Medicines: Except for blood thinners, take all of your other morning medications with a sip of water . Make sure to take your heart and blood pressure medicines. If your blood pressure's lower number is above 100, the case will be rescheduled. Blood thinners: Make sure to stop your blood thinners as instructed.  If you take a blood thinner, but were not instructed to stop it, call our office [phone] and ask to talk to a nurse. Not stopping a blood thinner prior to certain procedures could lead to serious complications. Diabetics on insulin: Notify the staff so that you can be scheduled 1st case in the morning. If your diabetes requires high dose insulin, take only  of your normal insulin dose the morning of the procedure and notify the staff that you have done so. Preventing infections: Shower with an antibacterial soap the morning of your procedure.  Build-up your immune system: Take 1000 mg of Vitamin C with every meal (3 times a day) the day prior to your procedure. Antibiotics: Inform the nursing staff if you are taking any antibiotics or if you have any conditions that may require antibiotics prior to procedures. (Example: recent joint implants)   Pregnancy: If you are pregnant make sure to notify the nursing staff. Not doing so may result in injury to the fetus, including death.  Sickness: If you have a cold, fever, or any active infections, call and cancel or reschedule your procedure. Receiving steroids while having an infection may result in complications. Arrival: You must be in the facility at least 30 minutes prior to your scheduled procedure. Tardiness: Your scheduled time is also the cutoff time. If you do not arrive at least 15 minutes prior to your procedure, you will be rescheduled.  Children: Do not bring any children  with you. Make arrangements to keep them home. Dress appropriately: There is always a possibility that your clothing may get soiled. Avoid  long dresses. Valuables: Do not bring any jewelry or valuables.  Reasons to call and reschedule or cancel your procedure: (Following these recommendations will minimize the risk of a serious complication.) Surgeries: Avoid having procedures within 2 weeks of any surgery. (Avoid for 2 weeks before or after any surgery). Flu Shots: Avoid having procedures within 2 weeks of a flu shots or . (Avoid for 2 weeks before or after immunizations). Barium: Avoid having a procedure within 7-10 days after having had a radiological study involving the use of radiological contrast. (Myelograms, Barium swallow or enema study). Heart attacks: Avoid any elective procedures or surgeries for the initial 6 months after a Myocardial Infarction (Heart Attack). Blood thinners: It is imperative that you stop these medications before procedures. Let us  know if you if you take any blood thinner.  Infection: Avoid procedures during or within two weeks of an infection (including chest colds or gastrointestinal problems). Symptoms associated with infections include: Localized redness, fever, chills, night sweats or profuse sweating, burning sensation when voiding, cough, congestion, stuffiness, runny nose, sore throat, diarrhea, nausea, vomiting, cold or Flu symptoms, recent or current infections. It is specially important if the infection is over the area that we intend to treat. Heart and lung problems: Symptoms that may suggest an active cardiopulmonary problem include: cough, chest pain, breathing difficulties or shortness of breath, dizziness, ankle swelling, uncontrolled high or unusually low blood pressure, and/or palpitations. If you are experiencing any of these symptoms, cancel your procedure and contact your primary care physician for an evaluation.  Remember:  Regular Business hours are:  Monday to Thursday 8:00 AM to 4:00 PM  Provider's Schedule: Eric Como, MD:  Procedure days: Tuesday and Thursday 7:30  AM to 4:00 PM  Wallie Sherry, MD:  Procedure days: Monday and Wednesday 7:30 AM to 4:00 PM Last  Updated: 01/15/2023 ______________________________________________________________________     ______________________________________________________________________    General Risks and Possible Complications  Patient Responsibilities: It is important that you read this as it is part of your informed consent. It is our duty to inform you of the risks and possible complications associated with treatments offered to you. It is your responsibility as a patient to read this and to ask questions about anything that is not clear or that you believe was not covered in this document.  Patients Rights: You have the right to refuse treatment. You also have the right to change your mind, even after initially having agreed to have the treatment done. However, under this last option, if you wait until the last second to change your mind, you may be charged for the materials used up to that point.  Introduction: Medicine is not an visual merchandiser. Everything in Medicine, including the lack of treatment(s), carries the potential for danger, harm, or loss (which is by definition: Risk). In Medicine, a complication is a secondary problem, condition, or disease that can aggravate an already existing one. All treatments carry the risk of possible complications. The fact that a side effects or complications occurs, does not imply that the treatment was conducted incorrectly. It must be clearly understood that these can happen even when everything is done following the highest safety standards.  No treatment: You can choose not to proceed with the proposed treatment alternative. The PRO(s) would include: avoiding the risk of complications associated with the therapy. The CON(s) would  include: not getting any of the treatment benefits. These benefits fall under one of three categories: diagnostic; therapeutic; and/or  palliative. Diagnostic benefits include: getting information which can ultimately lead to improvement of the disease or symptom(s). Therapeutic benefits are those associated with the successful treatment of the disease. Finally, palliative benefits are those related to the decrease of the primary symptoms, without necessarily curing the condition (example: decreasing the pain from a flare-up of a chronic condition, such as incurable terminal cancer).  General Risks and Complications: These are associated to most interventional treatments. They can occur alone, or in combination. They fall under one of the following six (6) categories: no benefit or worsening of symptoms; bleeding; infection; nerve damage; allergic reactions; and/or death. No benefits or worsening of symptoms: In Medicine there are no guarantees, only probabilities. No healthcare provider can ever guarantee that a medical treatment will work, they can only state the probability that it may. Furthermore, there is always the possibility that the condition may worsen, either directly, or indirectly, as a consequence of the treatment. Bleeding: This is more common if the patient is taking a blood thinner, either prescription or over the counter (example: Goody Powders, Fish oil, Aspirin , Garlic, etc.), or if suffering a condition associated with impaired coagulation (example: Hemophilia, cirrhosis of the liver, low platelet counts, etc.). However, even if you do not have one on these, it can still happen. If you have any of these conditions, or take one of these drugs, make sure to notify your treating physician. Infection: This is more common in patients with a compromised immune system, either due to disease (example: diabetes, cancer, human immunodeficiency virus [HIV], etc.), or due to medications or treatments (example: therapies used to treat cancer and rheumatological diseases). However, even if you do not have one on these, it can still  happen. If you have any of these conditions, or take one of these drugs, make sure to notify your treating physician. Nerve Damage: This is more common when the treatment is an invasive one, but it can also happen with the use of medications, such as those used in the treatment of cancer. The damage can occur to small secondary nerves, or to large primary ones, such as those in the spinal cord and brain. This damage may be temporary or permanent and it may lead to impairments that can range from temporary numbness to permanent paralysis and/or brain death. Allergic Reactions: Any time a substance or material comes in contact with our body, there is the possibility of an allergic reaction. These can range from a mild skin rash (contact dermatitis) to a severe systemic reaction (anaphylactic reaction), which can result in death. Death: In general, any medical intervention can result in death, most of the time due to an unforeseen complication. ______________________________________________________________________      ______________________________________________________________________    Steroid injections  Common steroids for injections Triamcinolone : Used by many sports medicine physicians for large joint and bursal injections, often combined with a local anesthetic like lidocaine . A study focusing on coccydynia (tailbone pain) found triamcinolone  was more effective than betamethasone, suggesting it may also be preferable for other localized inflammation conditions. Methylprednisolone: A common alternative to triamcinolone  that is also a strong anti-inflammatory. It is available in different formulations, with the acetate suspension being the long-acting option for intra-articular injections. Dexamethasone : This is a non-particulate steroid, meaning it has a lower risk of tissue damage compared to particulate steroids like triamcinolone  and methylprednisolone. While less common for this specific  use, it is an option for targeted injections.   Considerations for physicians Particulate vs. non-particulate steroids: Triamcinolone  and methylprednisolone are particulate, meaning they can clump together. Dexamethasone  is non-particulate. Particulate steroids are often preferred for their longer-lasting effects but carry a theoretical higher risk for certain injections (though this is less of a concern in the costochondral joints). Combined injectate: Corticosteroids are typically mixed with a local anesthetic like lidocaine  to provide both immediate pain relief (from the anesthetic) and longer-term inflammation reduction (from the steroid). Imaging guidance: To ensure accurate placement of the needle and medication, physicians may use ultrasound or fluoroscopic guidance for the injection, especially in complex or refractory cases.   Patient guidance Before undergoing a steroid injection, discuss the options with your physician. They will determine the best steroid, dosage, and procedure for your specific case based on factors like: Severity of your condition History of response to other treatments Your overall health status Experience and preference of the physician  Last  Updated: 10/01/2023 ______________________________________________________________________     "

## 2024-03-11 NOTE — Progress Notes (Signed)
 Safety precautions to be maintained throughout the outpatient stay will include: orient to surroundings, keep bed in low position, maintain call bell within reach at all times, provide assistance with transfer out of bed and ambulation.

## 2024-03-19 ENCOUNTER — Ambulatory Visit: Admitting: Pain Medicine
